# Patient Record
Sex: Male | Born: 1960 | Race: Black or African American | Hispanic: Refuse to answer | Marital: Married | Smoking: Former smoker
Health system: Southern US, Community
[De-identification: ages and names within clinical notes are randomized; demographics above are authoritative.]

## PROBLEM LIST (undated history)

## (undated) DIAGNOSIS — J45909 Unspecified asthma, uncomplicated: Secondary | ICD-10-CM

## (undated) DIAGNOSIS — U071 COVID-19: Secondary | ICD-10-CM

## (undated) DIAGNOSIS — M549 Dorsalgia, unspecified: Secondary | ICD-10-CM

## (undated) DIAGNOSIS — F419 Anxiety disorder, unspecified: Secondary | ICD-10-CM

## (undated) DIAGNOSIS — R1310 Dysphagia, unspecified: Secondary | ICD-10-CM

## (undated) DIAGNOSIS — K219 Gastro-esophageal reflux disease without esophagitis: Secondary | ICD-10-CM

## (undated) DIAGNOSIS — Z8719 Personal history of other diseases of the digestive system: Secondary | ICD-10-CM

## (undated) DIAGNOSIS — J339 Nasal polyp, unspecified: Secondary | ICD-10-CM

## (undated) DIAGNOSIS — T7840XA Allergy, unspecified, initial encounter: Secondary | ICD-10-CM

## (undated) DIAGNOSIS — E119 Type 2 diabetes mellitus without complications: Secondary | ICD-10-CM

## (undated) DIAGNOSIS — R0600 Dyspnea, unspecified: Secondary | ICD-10-CM

## (undated) DIAGNOSIS — R0602 Shortness of breath: Secondary | ICD-10-CM

## (undated) DIAGNOSIS — J189 Pneumonia, unspecified organism: Secondary | ICD-10-CM

## (undated) HISTORY — DX: Type 2 diabetes mellitus without complications: E11.9

## (undated) HISTORY — DX: Dysphagia, unspecified: R13.10

## (undated) HISTORY — DX: Gastro-esophageal reflux disease without esophagitis: K21.9

## (undated) HISTORY — DX: Shortness of breath: R06.02

## (undated) HISTORY — DX: Dorsalgia, unspecified: M54.9

## (undated) HISTORY — PX: FUNCTIONAL ENDOSCOPIC SINUS SURGERY: SUR616

## (undated) HISTORY — PX: DG THUMB RIGHT HAND (ARMC HX): HXRAD1825

## (undated) HISTORY — PX: PARTIAL COLECTOMY: SHX5273

## (undated) HISTORY — DX: Anxiety disorder, unspecified: F41.9

## (undated) HISTORY — PX: DG THUMB LEFT HAND: HXRAD1658

## (undated) HISTORY — DX: Dyspnea, unspecified: R06.00

## (undated) HISTORY — PX: COLON SURGERY: SHX602

## (undated) HISTORY — DX: Allergy, unspecified, initial encounter: T78.40XA

---

## 2001-09-25 ENCOUNTER — Ambulatory Visit (HOSPITAL_COMMUNITY): Admission: RE | Admit: 2001-09-25 | Discharge: 2001-09-25 | Payer: Self-pay | Admitting: *Deleted

## 2002-06-13 ENCOUNTER — Emergency Department (HOSPITAL_COMMUNITY): Admission: EM | Admit: 2002-06-13 | Discharge: 2002-06-14 | Payer: Self-pay | Admitting: Emergency Medicine

## 2002-06-13 ENCOUNTER — Encounter: Payer: Self-pay | Admitting: Emergency Medicine

## 2002-06-14 ENCOUNTER — Encounter: Payer: Self-pay | Admitting: Emergency Medicine

## 2002-12-25 ENCOUNTER — Ambulatory Visit (HOSPITAL_COMMUNITY): Admission: RE | Admit: 2002-12-25 | Discharge: 2002-12-25 | Payer: Self-pay | Admitting: *Deleted

## 2002-12-25 ENCOUNTER — Encounter (INDEPENDENT_AMBULATORY_CARE_PROVIDER_SITE_OTHER): Payer: Self-pay | Admitting: Specialist

## 2005-05-03 ENCOUNTER — Encounter: Admission: RE | Admit: 2005-05-03 | Discharge: 2005-05-03 | Payer: Self-pay | Admitting: *Deleted

## 2006-05-17 ENCOUNTER — Encounter: Admission: RE | Admit: 2006-05-17 | Discharge: 2006-05-17 | Payer: Self-pay | Admitting: Gastroenterology

## 2006-10-10 ENCOUNTER — Ambulatory Visit (HOSPITAL_COMMUNITY): Admission: RE | Admit: 2006-10-10 | Discharge: 2006-10-10 | Payer: Self-pay | Admitting: Gastroenterology

## 2007-01-23 ENCOUNTER — Inpatient Hospital Stay (HOSPITAL_COMMUNITY): Admission: EM | Admit: 2007-01-23 | Discharge: 2007-01-28 | Payer: Self-pay | Admitting: Surgery

## 2007-01-23 ENCOUNTER — Encounter: Admission: RE | Admit: 2007-01-23 | Discharge: 2007-01-23 | Payer: Self-pay | Admitting: Gastroenterology

## 2007-03-08 ENCOUNTER — Inpatient Hospital Stay (HOSPITAL_COMMUNITY): Admission: RE | Admit: 2007-03-08 | Discharge: 2007-03-17 | Payer: Self-pay | Admitting: Surgery

## 2007-03-08 ENCOUNTER — Encounter (INDEPENDENT_AMBULATORY_CARE_PROVIDER_SITE_OTHER): Payer: Self-pay | Admitting: Surgery

## 2007-09-11 ENCOUNTER — Ambulatory Visit (HOSPITAL_BASED_OUTPATIENT_CLINIC_OR_DEPARTMENT_OTHER): Admission: RE | Admit: 2007-09-11 | Discharge: 2007-09-11 | Payer: Self-pay | Admitting: Orthopedic Surgery

## 2009-09-09 ENCOUNTER — Ambulatory Visit: Payer: Self-pay | Admitting: Diagnostic Radiology

## 2009-09-09 ENCOUNTER — Ambulatory Visit (HOSPITAL_BASED_OUTPATIENT_CLINIC_OR_DEPARTMENT_OTHER): Admission: RE | Admit: 2009-09-09 | Discharge: 2009-09-09 | Payer: Self-pay | Admitting: Family Medicine

## 2009-09-09 ENCOUNTER — Ambulatory Visit: Payer: Self-pay | Admitting: Family Medicine

## 2009-09-09 DIAGNOSIS — J159 Unspecified bacterial pneumonia: Secondary | ICD-10-CM | POA: Insufficient documentation

## 2009-09-09 DIAGNOSIS — M79609 Pain in unspecified limb: Secondary | ICD-10-CM | POA: Insufficient documentation

## 2009-09-09 DIAGNOSIS — R109 Unspecified abdominal pain: Secondary | ICD-10-CM | POA: Insufficient documentation

## 2009-09-09 DIAGNOSIS — K219 Gastro-esophageal reflux disease without esophagitis: Secondary | ICD-10-CM | POA: Insufficient documentation

## 2009-09-09 DIAGNOSIS — M542 Cervicalgia: Secondary | ICD-10-CM | POA: Insufficient documentation

## 2009-12-21 ENCOUNTER — Encounter: Payer: Self-pay | Admitting: Family Medicine

## 2009-12-31 ENCOUNTER — Encounter (INDEPENDENT_AMBULATORY_CARE_PROVIDER_SITE_OTHER): Payer: Self-pay | Admitting: *Deleted

## 2010-01-22 ENCOUNTER — Ambulatory Visit: Payer: Self-pay | Admitting: Family Medicine

## 2010-01-22 DIAGNOSIS — K5289 Other specified noninfective gastroenteritis and colitis: Secondary | ICD-10-CM | POA: Insufficient documentation

## 2010-03-02 NOTE — Procedures (Signed)
Summary: [facility]  [facility]   Imported By: Lanelle Bal 01/07/2010 15:07:35  _____________________________________________________________________  External Attachment:    Type:   Image     Comment:   External Document

## 2010-03-02 NOTE — Procedures (Signed)
Summary: [facility]oscopy/[facility]  Colonoscopy/[facility]   Imported By: Lanelle Bal 01/07/2010 15:05:25  _____________________________________________________________________  External Attachment:    Type:   Image     Comment:   External Document

## 2010-03-02 NOTE — Miscellaneous (Signed)
  Clinical Lists Changes  Observations: Added new observation of COLONOSCOPY: Diverticulosis-mild (12/21/2009 11:13)      Preventive Care Screening  Colonoscopy:    Date:  12/21/2009    Results:  Diverticulosis-mild

## 2010-03-02 NOTE — Assessment & Plan Note (Signed)
Summary: new to est /cbs   Vital Signs:  Patient profile:   50 year old male Height:      71 inches Weight:      261 pounds BMI:     36.53 Pulse rate:   94 / minute BP sitting:   140 / 90  (left arm)  Vitals Entered By: Doristine Devoid CMA (September 09, 2009 3:29 PM) CC: NEW EST- check L foot and check lungs    History of Present Illness: 50 yo man here today to establish care.  previous MD- Janey Greaser (retired).  last CPE- Feb for Driving Exam.  1) hx of PNA- dx'd 6/6, '3/4 of a lung full'.  has a 'little nagging cough'.  no fevers since completing abxs.  denies SOB.  2) L foot pain- was playing softball and hit foot on a base.  3 weeks ago.  pain along top of foot on 1st metatarsal.  no swelling or edema.  3) abd pain- will have abd distension and bloating w/ intermittant sharp pains.  taking Prevacid 20mg  daily.  sxs started years ago, prior to colectomy.  sxs typically occur mid week.  improves w/ rest.  will have intestinal spasms.  has GI- Medoff.  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  described as a shooting pain.    Preventive Screening-Counseling & Management  Alcohol-Tobacco     Alcohol drinks/day: 0     Smoking Status: never      Sexual History:  currently monogamous.        Drug Use:  never.    Current Medications (verified): 1)  Prevacid 24hr 15 Mg Cpdr (Lansoprazole) .... Take One Tablet Daily  Allergies (verified): No Known Drug Allergies  Past History:  Past Medical History: GERD  Past Surgical History: Sigmoid Colectomy-Dr. 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 Eyes:  Denies blurring and  double vision. CV:  Denies chest pain or discomfort, difficulty breathing at night, lightheadness, near fainting, palpitations, shortness of breath with exertion, swelling of feet, and swelling of hands. Resp:  Complains of cough; denies pleuritic, sputum productive, and wheezing. GI:  Complains of abdominal pain, gas, and indigestion; denies bloody stools, change in bowel habits, constipation, loss of appetite, nausea, and vomiting. MS:  Denies joint redness, joint swelling, and loss of strength. Neuro:  Denies headaches.  Physical Exam  General:  Well-developed,well-nourished,in no acute distress; alert,appropriate and cooperative throughout examination, overweight. Head:  NCAT Eyes:  PERRL, EOMI Neck:  No deformities, masses. + Trapezius spasm Lungs:  Normal respiratory effort, chest expands symmetrically. Lungs are clear to auscultation, no crackles or wheezes. Heart:  Normal rate and regular rhythm. S1 and S2 normal without gallop, murmur, click, rub or other extra sounds. Abdomen:  soft, NT/ND, +BS Msk:  + TTP over 1st metatarsal on L Pulses:  +2 carotid, radial, DP Extremities:  no C/C/E   Impression & Recommendations:  Problem # 1:  FOOT PAIN, LEFT (ICD-729.5) Assessment New pain over 1st metatarsal.  xray to r/o fx.  start NSAID Orders: T-Foot Left Min 3 Views ([id number])  Problem # 2:  ABDOMINAL PAIN (ICD-789.00) Assessment: New pt has GI specialist.  taking Prevacid.  offered bentyl but  pt prefers discussion w/ GI.  Problem # 3:  BACTERIAL PNEUMONIA (ICD-482.9) Assessment: New hx of recent infxn.  lungs now clear.  reviewed post-infectious cough and that it can persist.  pt otherwise asymptomatic.  Problem # 4:  NECK PAIN (ICD-723.1) Assessment: New most likely related to pt's very tight trapezius spasm.  start NSAID.  reviewed supportive care and red flags that should prompt return.  Pt expresses understanding and is in agreement w/ this plan. His updated medication  list for this problem includes:    Naprosyn 500 Mg Tabs (Naproxen) .Marland Kitchen... 1 two times a day x5-7 days and then as needed for pain.  take w/ food.  Complete Medication List: 1)  Prevacid 24hr 15 Mg Cpdr (Lansoprazole) .... Take one tablet daily 2)  Naprosyn 500 Mg Tabs (Naproxen) .Marland Kitchen.. 1 two times a day x5-7 days and then as needed for pain.  take w/ food.  Patient Instructions: 1)  Follow up in 3-4 weeks for your complete physical.  Do not eat before this appt 2)  Take the Naprosyn as directed for your foot pain 3)  Go to the [facility] for your x[facility]- turn R out of the parking lot, make your 1st R onto [facility].  At the intersection of [place] [address] turn [address].  At first light [address]) turn R and [facility] is on L hand side. 4)  Ice the foot for pain relief 5)  Your neck pain is related to muscle spasm- the anti-inflammatory will help this as well 6)  Call Dr Kinnie Scales to discuss your abdominal pain 7)  Welcome!  We're glad to have you! Prescriptions: NAPROSYN 500 MG TABS (NAPROXEN) 1 two times a day x5-7 days and then as needed for pain.  take w/ food.  #60 x 0   Entered and Authorized by:   Neena Rhymes MD   Signed by:   Neena Rhymes MD on 09/09/2009   Method used:   [facility]        [facility] [address] [phone]* (retail)       [address]       [place], [place]  [address]       Ph: [phone] or [phone]       Fax: [phone]   RxID:   [id number]

## 2010-03-04 NOTE — Assessment & Plan Note (Signed)
Summary: FOR VOMITING//PH   Vital Signs:  Patient profile:   50 year old male Height:      71 inches Weight:      243 pounds O2 Sat:      97 % on Room air Temp:     98.8 degrees F oral Pulse rate:   81 / minute BP sitting:   98 / 62  (left arm)  Vitals Entered By: Jeremy Johann CMA (January 22, 2010 1:00 PM)  O2 Flow:  Room air CC: NVD x2days   History of Present Illness: 50 yo man here today for N/V/D.  started vomiting Wed night.  also having watery diarrhea- reports it is black.  recently had normal colonoscopy.  no longer vomiting.  some intermittant abd pain.  lots of leg and muscle cramps.  eating broth and drinking gatorade- able to hold this down.  + cold sweats.  had burger w/ discolored onion on Wed.  no other sick contacts.  feeling better than previously.  Current Medications (verified): 1)  Prevacid 24hr 15 Mg Cpdr (Lansoprazole) .... Take One Tablet Daily 2)  Naprosyn 500 Mg Tabs (Naproxen) .Marland Kitchen.. 1 Two Times A Day X5-7 Days and Then As Needed For Pain.  Take W/ Food.  Allergies (verified): No Known Drug Allergies  Review of Systems      See HPI  Physical Exam  General:  Well-developed,well-nourished,in no acute distress; alert,appropriate and cooperative throughout examination, overweight. Head:  NCAT Mouth:  MMM Lungs:  Normal respiratory effort, chest expands symmetrically. Lungs are clear to auscultation, no crackles or wheezes. Heart:  Normal rate and regular rhythm. S1 and S2 normal without gallop, murmur, click, rub or other extra sounds. Abdomen:  soft, NT/ND, +BS   Impression & Recommendations:  Problem # 1:  GASTROENTERITIS (ICD-558.9) Assessment New sxs resolving spontaneously.  start phenergan for nausea.  discussed importance of hydration.  reviewed BRAT diet and stepwise advancement to regular diet.  reviewed supportive care and red flags that should prompt return.  Pt expresses understanding and is in agreement w/ this plan.  Complete  Medication List: 1)  Prevacid 24hr 15 Mg Cpdr (Lansoprazole) .... Take one tablet daily 2)  Naprosyn 500 Mg Tabs (Naproxen) .Marland Kitchen.. 1 two times a day x5-7 days and then as needed for pain.  take w/ food. 3)  Promethazine Hcl 25 Mg Tabs (Promethazine hcl) .Marland Kitchen.. 1 tab by mouth q6 as needed for nausea  Patient Instructions: 1)  This appears to be improving on its own 2)  Take the promethazine as needed for nausea 3)  Drink plenty of fluids 4)  The muscle cramps are from the dehydration and low potassium- try and eat some bananas and continue the gatorade 5)  Add bland foods like saltines or other crackers 6)  REST! 7)  Call with any questions or concerns 8)  Hang in there!! 9)  Happy Holidays! Prescriptions: PROMETHAZINE HCL 25 MG  TABS (PROMETHAZINE HCL) 1 tab by mouth Q6 as needed for nausea  #30 x 0   Entered and Authorized by:   Neena Rhymes MD   Signed by:   Neena Rhymes MD on 01/22/2010   Method used:   [facility] to        [facility] [address] [phone]* (retail)       [address]       [place], [place]  [address]       Ph: [phone] or [phone]  Fax: [phone]   RxID:   [id number]    Orders Added: 1)  Est. Patient Level III [30865]

## 2010-03-08 ENCOUNTER — Encounter: Payer: Self-pay | Admitting: Family Medicine

## 2010-03-08 ENCOUNTER — Ambulatory Visit (INDEPENDENT_AMBULATORY_CARE_PROVIDER_SITE_OTHER): Payer: Self-pay | Admitting: Family Medicine

## 2010-03-08 DIAGNOSIS — R42 Dizziness and giddiness: Secondary | ICD-10-CM | POA: Insufficient documentation

## 2010-03-18 NOTE — Assessment & Plan Note (Signed)
Summary: dizzy, lightheaded, ears draining///sph---wants to wait till ...  Nurse Visit   Vital Signs:  Patient profile:   50 year old male Weight:      248 pounds BMI:     34.71 Temp:     98.4 degrees F oral Pulse rate:   80 / minute BP sitting:   110 / 76  (left arm)  Vitals Entered By: Doristine Devoid CMA (March 08, 2010 11:30 AM)  History of Present Illness: 50 yo man here today for dizziness.  most noticeable when holding head to the R or changing positions.  sxs started 1 week ago.  will have dizziness upon turning over in bed first thing in the AM.  no hx of similar.  no CP, SOB, HAs, visual changes.   Patient Instructions: 1)  This is a condition caused benign positional vertigo 2)  It should improve w/ time 3)  Take the Meclizine as needed for dizziness 4)  Do the repositioning exercises 5)  Drink plenty of fluids 6)  If no improvement, please call! 7)  Hang in there!  CC: dizziness when turning head able to move around but last throughout the day but feels lightheaded    Current Medications (verified): 1)  Prevacid 24hr 15 Mg Cpdr (Lansoprazole) .... Take One Tablet Daily 2)  Promethazine Hcl 25 Mg  Tabs (Promethazine Hcl) .Marland Kitchen.. 1 Tab By Mouth Q6 As Needed For Nausea 3)  Meclizine Hcl 25 Mg Tabs (Meclizine Hcl) .Marland Kitchen.. 1 By Mouth Q6 As Needed For Vertigo  Allergies (verified): No Known Drug Allergies  Review of Systems      See HPI   Physical Exam  General:  Well-developed,well-nourished,in no acute distress; alert,appropriate and cooperative throughout examination, overweight. Head:  NCAT, no TTP over sinuses Eyes:  no injxn, inflammation, 2 beats of horizontal nystagmus Ears:  External ear exam shows no significant lesions or deformities.  Otoscopic examination reveals clear canals, tympanic membranes are intact bilaterally without bulging, retraction, inflammation or discharge. Hearing is grossly normal bilaterally. Nose:  External nasal examination shows no  deformity or inflammation. Nasal mucosa are pink and moist without lesions or exudates. Mouth:  Oral mucosa and oropharynx without lesions or exudates.  Teeth in good repair. Lungs:  Normal respiratory effort, chest expands symmetrically. Lungs are clear to auscultation, no crackles or wheezes. Heart:  Normal rate and regular rhythm. S1 and S2 normal without gallop, murmur, click, rub or other extra sounds. Pulses:  +2 carotid, radial, DP Extremities:  no C/C/E Neurologic:  No cranial nerve deficits noted. Station and gait are normal. Plantar reflexes are down-going bilaterally. DTRs are symmetrical throughout. Sensory, motor and coordinative functions appear intact.    Impression & Recommendations:  Problem # 1:  VERTIGO (ICD-780.4) Assessment New pt's sxs consistent w/ BPV.  reviewed dx, printed exercises for pt, meclizine as needed.  reviewed supportive care and red flags that should prompt return.  Pt expresses understanding and is in agreement w/ this plan. His updated medication list for this problem includes:    Promethazine Hcl 25 Mg Tabs (Promethazine hcl) .Marland Kitchen... 1 tab by mouth q6 as needed for nausea    Meclizine Hcl 25 Mg Tabs (Meclizine hcl) .Marland Kitchen... 1 by mouth q6 as needed for vertigo  Complete Medication List: 1)  Prevacid 24hr 15 Mg Cpdr (Lansoprazole) .... Take one tablet daily 2)  Promethazine Hcl 25 Mg Tabs (Promethazine hcl) .Marland Kitchen.. 1 tab by mouth q6 as needed for nausea 3)  Meclizine Hcl 25 Mg  Tabs (Meclizine hcl) .Marland Kitchen.. 1 by mouth q6 as needed for vertigo   Orders Added: 1)  Est. Patient Level III [99213] Prescriptions: MECLIZINE HCL 25 MG TABS (MECLIZINE HCL) 1 by mouth Q6 as needed for vertigo  #45 x 0   Entered and Authorized by:   Neena Rhymes MD   Signed by:   Neena Rhymes MD on 03/08/2010   Method used:   [facility]        [facility] [address] [phone][facility] (retail)       [address]       [place], [place]  [id number]        Ph: [phone] or [phone]       Fax: [phone]   RxID:   [id number]

## 2010-04-27 ENCOUNTER — Encounter: Payer: Self-pay | Admitting: Family Medicine

## 2010-04-28 ENCOUNTER — Ambulatory Visit (INDEPENDENT_AMBULATORY_CARE_PROVIDER_SITE_OTHER): Payer: BC Managed Care – PPO | Admitting: Family Medicine

## 2010-04-28 VITALS — BP 100/70 | Temp 98.2°F | Wt 250.5 lb

## 2010-04-28 DIAGNOSIS — M5430 Sciatica, unspecified side: Secondary | ICD-10-CM | POA: Insufficient documentation

## 2010-04-28 MED ORDER — NAPROXEN 500 MG PO TABS: ORAL_TABLET | ORAL | Status: DC

## 2010-04-28 NOTE — Assessment & Plan Note (Signed)
Pt's pain consistent w/ sciatica.  No red flags on hx or PE.  Start NSAIDs, muscle relaxers at night.  Heat, gentle stretching for pain relief.  Reviewed supportive care and red flags that should prompt return.  Pt expressed understanding and is in agreement w/ plan.

## 2010-04-28 NOTE — Patient Instructions (Signed)
This appears to be a muscle spasm induced sciatica- this should improve w/ time and our treatments plan below Start the Naprosyn 2x/day for 7 days and then as needed for pain- take w/ food Heat for pain relief- heating pad or thermacare patch Do some gentle stretching and some walking to avoid stiffness Use the muscle relaxer as needed at night Call if no improvement after 7- 10 days Hang in there!!

## 2010-04-28 NOTE — Progress Notes (Signed)
  Subjective:    Patient ID: Thomas [name], male    DOB: December 09, 1960, 50 y.o.   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  Intermittant.  Pain will radiate down back of leg and he will some numbness and tingling of calf.  Pain improved w/ rest, muscle relaxer.  Pain worsens w/ increased activity.  Has scaled back on exercising due to pain.  Pain will cause pt to alter walk.  'like a grabbing pain'.  No hx of similar.  No bowel or bladder incontinence.  Drives to [place] for work and sits in same position daily.   Review of Systems For ROS see HPI.    Objective:   Physical Exam  Constitutional: He appears well-developed and well-nourished. No distress.  Musculoskeletal:       No TTP over spine, lumbar paraspinals, or SI joints. (-) SLR bilaterally No pain w/ back extension, 'pulling' pain w/ forward flexion, no pain w/ lateral bending or rotation Strength of LEs 5/5 and symmetric DTRs +2 and symmetric  Neurological: He has normal strength and normal reflexes. No sensory deficit.          Assessment & Plan:

## 2010-06-15 NOTE — Op Note (Signed)
NAMEFRANCISCA, HARBUCK               ACCOUNT NO.:  [id number]   MEDICAL RECORD NO.:  [id number]          PATIENT TYPE:  INP   LOCATION:  1538                         FACILITY:  [facility]   PHYSICIAN:  Excell Seltzer. Annabell Howells, M.D.    DATE OF BIRTH:  02/17/1960   DATE OF PROCEDURE:  03/08/2007  DATE OF DISCHARGE:                               OPERATIVE REPORT   PROCEDURE:  Cystoscopy, right retrograde pyelogram with insertion of  bilateral ureteral catheters.   PREOPERATIVE DIAGNOSIS:  History of diverticulitis with pelvic abscess.   POSTOPERATIVE DIAGNOSIS:  History of diverticulitis with pelvic abscess.   SURGEON:  Dr. Bjorn Pippin.   ANESTHESIA:  General.   COMPLICATIONS:  Contrast extravasation on the right with inability to  place the stent to the kidney.   INDICATIONS:  Keigo is a 50 year old, African American male with a  history of diverticulitis with pelvic abscesses. He is to undergo a  sigmoid resection. Stents were requested for ureteral identification to  be placed prior to the surgery.   FINDINGS AND PROCEDURE:  The patient was taken to the operating room, a  general anesthetic was induced. He was placed in lithotomy position. He  received prophylactic antibiotics per Dr. Ezzard Standing. He was prepped with  Betadine solution and draped in the usual sterile fashion.  Cystoscopy  was performed using the 22-French scope and 12 and 70 degrees lenses.  Examination revealed a normal urethra. The external sphincter was  intact. 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 Examination of the bladder revealed mild trabeculation with  no tumor, stones or inflammation.  Ureteral orifices were unremarkable.   I  attempted to cannulate the right ureteral orifice with a 5-French  open-end catheter but the catheter would not pass more than 2 cm beyond  the orifice. A Glidewire was then passed through the open-end catheter  and was advanced to the lower portion of  the proximal ureter but could  go no further. The ureteral catheter was advanced over the wire, the  wire was removed and contrast was instilled.  This demonstrated passage  of the contrast with what appeared to be a normal ureter up to about the  level of L4 at which point it tapered to close with no contrast  bypassing. The ureter distal to L-4 filled in a normal fashion, but  there was periureteral extravasation. An attempt was made to pass both a  standard guidewire and a Glidewire past the area of apparent obstruction  but this as unsuccessful. Because the purpose of this procedure was  merely to provide for ureteral identification during dissection, I left  a ureteral catheter at the most proximal level I could and did not force  the issue to get it into the kidney.   The retrograde pyelogram demonstrated ureteral tapering with obstruction  at L4. This may be due to a mucosal flap. There was extra ureteral  extravasation.   The preop CT was reviewed. He had no obstruction of the kidney  preoperatively and no lesions to suggest impending obstruction.   Once the right side had  been cannulated, the left ureteral orifice was  cannulated with a 5-French open-end catheter which easily passed to the  kidney under fluoroscopic guidance. At this point, the cystoscope was  removed, the ureteral catheters were left exiting the urethra and an 81-  French catheter was then passed per urethra. The balloon was filled with  10 mL of sterile fluid.  The stents were then secured to the catheter  with a silk tie and all were placed to straight drainage. I spoke with  Dr. Ezzard Standing about the situation with the right ureter and at this point I  believe the most appropriate thing is merely to remove the ureteral  catheter at the end of the case as the problem we encountered was likely  a mucosal flap and not true obstruction and hopefully there will be no  residual issues with a difficult  catheterization, but I did recommend a  renal ultrasound tomorrow unless the patient developed acute symptoms  which required more urgent imaging. I will follow closely. The patient  was then placed in the supine position and Dr. Ezzard Standing performed his  portion of the procedure.      Excell Seltzer. Annabell Howells, M.D.  Electronically Signed     JJW/MEDQ  D:  03/08/2007  T:  03/09/2007  Job:  [id number]   cc:   Sandria Bales. Ezzard Standing, M.D.  [address]  [place]  [place] [address]

## 2010-06-15 NOTE — H&P (Signed)
Thomas [name], Thomas [name]               ACCOUNT NO.:  [id number]   MEDICAL RECORD NO.:  [id number]          PATIENT TYPE:  INP   LOCATION:  1534                         FACILITY:  [facility]   PHYSICIAN:  Sandria Bales. Ezzard Standing, M.D.  DATE OF BIRTH:  May 10, 1960   DATE OF ADMISSION:  01/23/2007  DATE OF DISCHARGE:                              HISTORY & PHYSICAL   HISTORY OF ILLNESS:  The patient is a 50 year old black male, who was  formally a patient of Dr. Bascom [name] until he retired.  He is now seeing  Dr. Kinnie [name] from a GI standpoint, has not really established further  followup from a primary care standpoint.   Thomas [name] has had a history of symptomatic diverticular disease on and  off for some 5 years, thinks he may have had about 4 or 5 diverticular  attacks over those 5 years.  Most recently, he had some trouble in  September 2008, and was managed as an outpatient by Dr. Ritta [name].  He  had a CT scan that showed some diverticulitis but no abscess.  He was  placed on oral antibiotics that seemed to get better. However, last  week, on the December 18th, he again developed lower abdominal pain,  radiating to his genitals and rectum, had some 10+ bowel movements per  day, and was seen by Dr. Kinnie [name], who put him on Cipro and Flagyl for  recurrent diverticulitis.  Because he had no improvement in symptoms  over a few days, today, he was seen by Dr. Kinnie [name], who obtained a CT  scan at [facility].  The CT scan shows 2 pelvic abscesses, one 4.5 x 3.6 cm up  against the adjacent between the sigmoid colon and bladder and the  second one 4.5 x 2.7, immediately posterior to the seminal vesicles.  I  was consulted for further management of this diverticular abscess.   Thomas [name] has had some trouble with reflux disease.  He takes Prevacid.  He has had an upper endoscopy by Dr. Kinnie [name] in November, who saw a small  hiatal hernia.  The same time, he also had a colonoscopy, had 2 benign  colonic polyps removed, but  there has never been any mention of colon  cancer in all these symptoms.   He has had no other abdominal surgery.  Has no history of liver disease,  gallbladder disease, pancreas disease.   PAST MEDICAL HISTORY:  His past medical history, he has no allergies.  Again, his only medication includes the Prevacid and medicine related to  his diverticulitis.   ALLERGIES:  NO ALLERGIES.   REVIEW OF SYSTEMS:  NEUROLOGIC:  No seizure, loss of consciousness.  PULMONARY:  Does not smoke cigarettes.  No history of pneumonia,  tuberculosis, or asthma.  CARDIAC:  No history of  heart disease, chest pain, hypertension. He  never had a cardiac catheterization.  GASTROINTESTINAL:  See history of present illness.  UROLOGIC:  No history of kidney disease or kidney infection.   PERSONAL HISTORY:  He is married. He has adult children.  His wife is  with him in the room.  I talked to and examined him.  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   PHYSICAL EXAMINATION:  VITAL SIGNS:  On physical exam, his temperature  is 97.6, blood pressure 107/75, pulse 72.  HEENT:  Unremarkable.  NECK:  Supple, showed no mass, no thyromegaly.  LUNGS:  Clear to auscultation with symmetric breath sounds.  HEART:  His heart has a regular rate and rhythm.  He is not tachycardic.  He has no murmur.  ABDOMEN:  He has tenderness along his left flank, left lower quadrant  and suprapubic area with some mild guarding.  He has got maybe even some  mild rebound.  He does have active bowel sounds.  I feel no hernia, no  mass.  RECTAL:  I did not do a rectal exam on him.  EXTREMITIES:  Good strength in all 4 extremities.  NEUROLOGICAL:  Grossly intact.   Dr. Kinnie [name] obtained labs today and these were done at [facility].  His  sodium was 135, potassium 4.4, chloride of 97, CO2 of 30. His total  protein was 7.5, albumin 3.9.  His white blood count is 8900, hemoglobin  14, hematocrit 43. Platelet count was 373,000.  C-reactive  protein 128.   I reviewed the CT scan, at least verbally with Dr. Horace [name].   IMPRESSION:  1. Impression is that of pelvic abscess, secondary to diverticular      disease.   PLAN:  IV antibiotics, n.p.o., IV fluids.  We will repeat labs and  follow up.  My hope is this will resolve well enough to get him out of  the hospital on oral antibiotics and plan elective surgery 6-12 weeks  down the road.  If we cannot stabilize his diverticular disease, he may  need more urgent surgery.  He understands that more urgent surgery has a  high risk of leading to a colostomy, where if he can be delayed and  clear this up, there may be less need for a colostomy.   I think he understands about the pros and cons of current medical  management.   1. Gastroesophageal reflux disease, on Prevacid.      Sandria Bales. Ezzard Standing, M.D.  Electronically Signed     DHN/MEDQ  D:  01/23/2007  T:  01/24/2007  Job:  [id number]   cc:   Thomas [name], M.D.  Fax: [phone]   Thomas Decant. Janey Greaser, MD  Fax: [phone]

## 2010-06-15 NOTE — Op Note (Signed)
NAMEELDRED, SOOY               ACCOUNT NO.:  [id number]   MEDICAL RECORD NO.:  [id number]          PATIENT TYPE:  INP   LOCATION:  1538                         FACILITY:  [facility]   PHYSICIAN:  Sandria Bales. Ezzard Standing, M.D.  DATE OF BIRTH:  [birth year]   DATE OF PROCEDURE:  03/08/2007  DATE OF DISCHARGE:                               OPERATIVE REPORT   PREOPERATIVE DIAGNOSIS:  Sigmoid diverticulitis with history of pelvic  abscess.   POSTOPERATIVE DIAGNOSIS:  Sigmoid diverticulitis with abscess  essentially resolved.   PROCEDURE:  Laparoscopic/hand assisted sigmoid colectomy using a 28 EEA  stapler, mobilization of splenic flexure, rigid sigmoidoscopy.  Bilateral ureteral stents by Dr. Ranelle Oyster   SURGEON:  Sandria Bales. Ezzard Standing, M.D.   FIRST ASSISTANT:  Currie Paris, M.D.   ANESTHESIA:  General endotracheal.   BLOOD LOSS:  Minimal.   INDICATIONS FOR PROCEDURE:  Mr. Doss is a 50 year old black male who is  a patient of Dr. Doran Clay prior to his retirement.  He has not yet  identified a primary care physician through [facility] to follow him.  He is also seen by Dr. Ritta Slot.  He has had  repeated bouts of diverticular disease on and off for as long as 5  years.  Most recently he was hospitalized at [facility] from  the 23 to 28 January 2007 with a pelvic abscess thought to be secondary  to his sigmoid colon diverticulitis.  I discussed with him about  proceeding with surgery to resect a segment of sigmoid colon.   I discussed the indications, potential complications.  Potential  complications include but not limited to bleeding, infection, leak from  the bowel, recurrence of diverticulitis.  He has undergone a colonoscopy  recently by Dr. Shela Commons. Kinnie Scales.  I think it is unlikely that any malignancy  is playing a role in this process.   OPERATIVE NOTE:  The patient presented to the operating room and  underwent general endotracheal tube  anesthetic.  He was given a gram of  cefoxitin initiation his procedure.  He had PAS stockings placed, placed  lithotomy position.  His abdomen was prepped.  He had been given  Cefoxitin as a pre-op antibiotic.  He had a foley with bilateral  ureteral stents in place.   Because of the pelvic nature of the abscess and it was unclear what I  would run into, I did as Dr. Bjorn Pippin to place bilateral ureteral  stents.  The left stent placement went okay.  The right stent he got  about up to the iliac vessels and then had some extravasation and left  the stent in that area.   I then held a time-out identifying the patient, procedure and proceeded  with operation.  I accessed the abdominal cavity from right upper  quadrant using a 11-mm Ethicon Optiview.  I then placed six additional 5-  mm trocars.  I placed a 5 right lower quadrant, a 5 mid abdominal and  upper mid abdominal, a 5 mm trocar in the left upper quadrant, a 5 mm  trocar in the left lower quadrant and a 5 mm trocar in the left mid  abdominal trocar and these were all 5 mm trocars.  I spent probably  about an hour mobilizing the splenic flexure.  I tried to go across the  transverse colon to take down the gastrocolic ligament.  I found the  spleen and took down the splenic flexure and then mobilized the left  colon.   I then got down to the pelvis.  The patient had some strandy scarring  and adhesions but these were actually fairly easily broken up. He also  had a fairly thickened peritoneal reflection over the distal colon all  consistent with his recent abscess.  But there was no obvious abscess.  It had responded well to the antibiotics.   I was able to actually break up the adhesions and identified this fairly  well.  I got to the pelvic brim.  I thought I had the sigmoid colon long  enough so then I used the [employer] hand assist port and placed  this through a lower midline incision.   However I did not think I  had it quite the left colon down quite well so  I then used the Gel Port put my hand into the abdomen to help take down  some further adhesions along the left colon.   I now went back to the pelvis, identified an area that I thought was the  area of perforation, actually took my dissection all the way down to the  peritoneal reflection int he pelvis, maybe 1 or 2 cm right above it,  beyond what I thought was the most distal diverticula.  I went up to the  proximal third of the sigmoid colon.  I took out about 12-15 inches of  sigmoid colon.   I divided the proximal sigmoid colon with a 75 GIA stapler.  The distal  colon was divided with the Contour stapler, a blue load.  I was able to  identify the both ureters.   After that I took down the mesentery of the colon using the LigaSure  stapler.  So I now had resected the colon.  It was a fairly low  anastomosis right above the peritoneal flexure I thought best served  with a EEA stapler.   I cut out the staples in the proximal bowel.  I used a sizer that looked  like a 28 EEA was sized the best.  I attached the anvil using a 2-0  prolene pursestring.  Dr. Jamey Ripa broke scrub and went below.  He then  passed the 28 EEA stapler up through the rectal pouch and I then  connected these with a firing.  He then endoscoped the patient, saw no  bleeding, could see the anastomosis, insufflated air while I clamped the  bowel and we submerged this under water.  There was no leak or bubbling.  I did put the stitch on each side of 2-0 silk to help keep any tension  off the colon.   This point I irrigated the abdomen with 2 liters of irrigation.  There  was really no mesentery to close.  The bowel lay flat against the pelvic  brim and I really thought there was no tension on the pelvis.   Also even thought I did mobilize the left colon the omentum, I really  still could not get the omentum to come all the way down to his lower  abdomen.  I then  closed his abdomen with two running #1 PDS sutures.  Also put a couple interrupted PDS sutures to help close the fascia.  I  then irrigated the subcutaneous tissue.  I did infiltrate all the trocar  sites and the wound with a total about 30 mL of 0.25% Marcaine.  I then  irrigated the wound, closed the skin with the skin staples and placed  three Telfa wicks in the wound and stapled all the trocar sites.   The patient tolerated procedure well, was transported to the recovery  room in good condition.  Sponge and needle counts were correct at the  end of this case.  I did remove the ureteral stents at the end of the  case but I left the Foley in place.  He also the NG tube in position and  my other exploration during the case.  The remainder of his bowel  was unremarkable.  He tolerated procedure well, was transported to  recovery room good condition.  Sponge and needle counts were correct end  of the case.   Of note, Dr. Annabell Howells did ask that I get a renal ultrasound tomorrow just  to check his kidneys.      Sandria Bales. Ezzard Standing, M.D.  Electronically Signed     DHN/MEDQ  D:  03/08/2007  T:  03/09/2007  Job:  [id number]   cc:   Al Decant. Janey Greaser, MD  Fax: [phone]   Griffith Citron, M.D.  Fax: [phone]   Excell Seltzer. Annabell Howells, M.D.  Fax: [phone]

## 2010-06-15 NOTE — Op Note (Signed)
NAMELOGYN, KENDRICK               ACCOUNT NO.:  [id number]   MEDICAL RECORD NO.:  [id number]          PATIENT TYPE:  AMB   LOCATION:  DSC                          FACILITY:  [facility]   PHYSICIAN:  Katy Fitch. Sypher, M.D. DATE OF BIRTH:  July 27, 1960   DATE OF PROCEDURE:  09/11/2007  DATE OF DISCHARGE:                               OPERATIVE REPORT   PREOPERATIVE DIAGNOSES:  Unstable, chronically painful, weak left thumb  due to instability of metacarpal phalangeal joint following injury  sustained in 2008 with identification of 65 degrees of hyperextension  instability of metacarpophalangeal joint and chronic laxity of radial  collateral ligament complex.   POSTOPERATIVE DIAGNOSES:  Unstable, chronically painful, weak left thumb  due to instability of metacarpal phalangeal joint following injury  sustained in 2008 with identification of 65 degrees of hyperextension  instability of metacarpophalangeal joint and chronic laxity of radial  collateral ligament complex.  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 Examination of left thumb metacarpophalangeal joint under      anesthesia demonstrating 65 degrees of hyperextension instability      due to a volar plate laxity and greater than 40 degrees of ulnar      deviation instability and palmar subluxation of proximal phalanx      due to instability of dorsal capsule and radial collateral ligament      complex.  2. Arthrodesis of left thumb metacarpophalangeal joint utilizing      autogenous bone graft harvested from the condyles of the metacarpal      and from reamings of the metacarpal followed by Acutrak-2 fixation      utilizing 30-mm and 24-mm Acutrak screw construct.   OPERATING SURGEON:  Katy Fitch. Sypher, MD   ASSISTANT:  Marveen Reeks. Dasnoit, PA-C.   ANESTHESIA:  General  by LMA.   SUPERVISING ANESTHESIOLOGIST:  Zenon Mayo, MD   INDICATIONS:  Kindred Reidinger is a 50 year old right-hand dominant  employee of [employer].   He is status post a significant injury to the left thumb sustained on  the job in October 2008.  He was initially referred for evaluation and  management at [facility].  He was examined by the hand  surgeon and found to have stiffness of the MP and IP joints.  He was  treated with rest, activity modification, splinting, and therapy.  Unfortunately, he was unable to regain normal pinch strength and had a  significant predicament with hyperextension instability of the  metacarpophalangeal joint and a stiff swan-neck posture of the thumb  with a flexion contracture of the interphalangeal joint.   He was seen for a second opinion consultation and his stiff swan-neck  predicament identified.   We had a lengthy informed consent regarding treatment options.  We  advised the possibility of volar plate and radial collateral ligament  reconstruction versus arthrodesis of the thumb.   The worker's compensation insurance carrier required a neutral opinion.  This [place] through vendor.  The initial  opinion was not to approve surgery as the consultant felt that  impairment was not demonstrated.   We once again pointed out to the insurance carrier that Mr. Malia had  marked instability of his MP joint, a stiff swan-neck, and activity of  daily living impairment both at home and at work.   His pinch and grip strength were impaired as documented by objective  measurement.   Ultimately, approval for reconstruction of his thumb was achieved.  He  is now brought to the operating at this time anticipating probable  arthrodesis.   Preoperatively, questions were invited and answered in detail.   PROCEDURE:  Aslan Himes. Jamar is brought to the operating room and placed  in supine  position on the operating table.   Following an anesthesia consult by Dr. Sampson Goon, general anesthesia by  LMA was recommended and accepted by Mr. Borgmeyer.   He is brought to room-6 and placed in supine position on the operating  table and under Dr. Sampson Goon direct supervision, general endotracheal  anesthesia was induced.   Left arm was prepped with Betadine soap solution and sterilely draped.  One gram of Ancef was administered as IV prophylactic antibiotic.   The left arm was exsanguinated with an Esmarch bandage and an arterial  tourniquet proximal on the brachium was inflated to 220 mmHg.   Procedure commenced with examination of the thumb MP joint under  anesthesia.  Mr. Gahm was noted to have hyperextension of his MP joint  of 65 degrees and further flexion of 45 degrees.  He had greater than 40  degrees of ulnar deviation instability at 30 degrees of flexion of the  MP joint evidencing an incompetent radial collateral ligament complex.   He was noted also on resting films to have a palmar subluxation of the  proximal phalanx and a pronated posture due to incompetence of the  dorsal capsule and radial collateral ligament complex.   Given the sum total of predicaments in my judgment, reconstruction of  the volar plate and radial collateral ligament would be unpredictable in  allowing him to return to full duty at [employer] and to recover excellent  pinch and grip strength.  Therefore, we recommended proceeding with an  arthrodesis of the MP joint.   The procedure commenced with a 5-cm curvilinear incision exposing the  extensor mechanism over the metacarpophalangeal joint.   The interval between extensor pollicis brevis and extensor pollicis  longus was sharply incised, and the capsule of the MP joint identified.  This was split in a longitudinal dorsal midline and the MP joint was  opened shotgun style by release of the radial ulnar collateral ligaments  off the metacarpal  head.   The hyaline articular cartilage surfaces of the MP joint were studied.  The central and inferior half of the MP joint had significant  degenerative change of grade II and III chondromalacia.  There were  marginal osteophytes forming at the radial and ulnar condyles of the  metacarpal head.  The central portion of the proximal phalanx had  significant chondromalacia and the dorsal portion of the articular  surface of the proximal phalanx had grade 3 chondromalacia due to the  palmar subluxation of the MP joint causing contact pressure at this  point.   Given these findings, we proceeded directly to a cup-and-cone type  arthrodesis.  Power burs were used to create a proper cup and cone as  well  as hand sculpting with a rongeur on the metacarpal head.  The joint  was set at a position of neutral deviation 15 degrees flexion and slight  pronation.   Two 0.045-inch Kirschner wires were used to pilot the Acutrak screws.  A  pair of Acutrak-2 mini screws were placed one 30 mm in length and one 24  mm in length at different planes to create compression and rotation  control.   A very satisfactory construct was achieved.   Once the MP joint was stabilized, the IP motion was examined.  Mr. Urizar  had extension to neutral and further flexion to 60 degrees.   We should be able to recover a more normal arc of IP motion once the MP  joint is stabilized.   The wound was thoroughly lavage with sterile saline.  Bone graft  harvested from metacarpal head was packed around the arthrodesis site  followed by closure of the periosteum with a series of mattress sutures  of 3-0 Ethibond.  The extensor mechanism was then repaired with figure-  of-eight sutures of 3-0 Ethibond and a running finishing suture.  All  knots were buried deep.  The wound was irrigated followed by repair of  the skin with intradermal 3-0 Prolene and a compressive dressing.  A  thumb spica splint was applied for  protection.   Mr. Eckerman tolerated the surgery and anesthesia well.  He was transferred  to recovery room with stable signs.      Katy Fitch Sypher, M.D.  Electronically Signed     RVS/MEDQ  D:  09/11/2007  T:  09/12/2007  Job:  [id number]

## 2010-06-18 NOTE — Discharge Summary (Signed)
Thomas [name], Thomas [name]               ACCOUNT NO.:  [id number]   MEDICAL RECORD NO.:  [id number]          PATIENT TYPE:  INP   LOCATION:  1509                         FACILITY:  [facility]   PHYSICIAN:  Sandria Bales. Ezzard Standing, M.D.  DATE OF BIRTH:  [birth year]   DATE OF ADMISSION:  01/23/2007  DATE OF DISCHARGE:  01/28/2007                               DISCHARGE SUMMARY   DISCHARGE DIAGNOSES:  1. Pelvic abscess secondary to sigmoid diverticulitis.  2. Gastroesophageal reflux disease.   HISTORY OF PRESENT ILLNESS:  This is a 50 year old black male who was a  patient of Dr. Omer Jack until Dr. Bascom Levels retirement.  He sees  Dr. Ritta Slot from a GI standpoint.   Mr. Vise has had a history of symptomatic diverticular disease on and  off for some five years.  He thinks he may have had 4-5 attacks over  those five years.  His most recent problem prior to this episode was  approximately September 2008 when he was managed as an outpatient by Dr.  Ritta Slot.  A CT scan at that time showed diverticulitis but no  obvious abscess.   He did well to approximately the 18th of December 2008 when again he  developed lower abdominal pain radiating to his genitals and rectum.  He  was having over 10 bowel movements a day.  He was placed on Cipro and  followed by Dr. Kinnie Scales, but without any improvement in his symptoms.  Dr. Kinnie Scales obtained a CT scan at [facility] on the 23rd of December.  A CT scan  showed two pelvic abscesses; a 4.5 x 3.6 cm abscess between the sigmoid  colon in the bladder and a second abscess 4.5 x 2.7 immediately  posterior to the seminal vesicles.  I was consulted for further  management of these diverticular abscesses.   Mr. Kohlbeck has a history of reflux and takes Prevacid.  He had an upper  endoscopy by Dr. Kinnie Scales in November 2008, in which she was found to have  a small hiatal hernia, but, otherwise, unremarkable.  He also had a  colonoscopy in November 2008 in which two benign polyps  were removed,  but there was no other abnormality.   REVIEW OF SYSTEMS:  Otherwise, noncontributory.   ADMISSION PHYSICAL EXAMINATION:  VITAL SIGNS:  Temperature 97.6, blood  pressure 107/75, pulse 72.  ABDOMEN:  Showed tenderness along his left flank and left lower quadrant  and suprapubic area.   LABORATORY DATA:  White blood count was 8900, hemoglobin 14.   He was admitted to the hospital where he was placed on IV antibiotics.  I placed him on Zosyn as antibiotic.  He was kept n.p.o. and observed.   He was in the hospital to the 28th for approximately five days in which  he felt steadily better.  Repeat CT scan showed the fluid collection was  smaller and improved, and he was ready for discharge on the 28th of  December.   DISCHARGE INSTRUCTIONS:  A low residue diet.  He can see me back in 1-2  weeks.  He was given Augmentin to  complete his antibiotic course, and he was  noted to call for any problems or questions.  We talked about letting  this abscess clear over a period of 4-8 weeks and then consider elective  sigmoid colectomy.      Sandria Bales. Ezzard Standing, M.D.  Electronically Signed     DHN/MEDQ  D:  02/19/2007  T:  02/19/2007  Job:  [id number]   cc:   Griffith Citron, M.D.  Fax: [phone]   Al Decant. Janey Greaser, MD  Fax: [phone]

## 2010-06-18 NOTE — Discharge Summary (Signed)
NAMEARMONIE, Thomas [name]               ACCOUNT NO.:  [id number]   MEDICAL RECORD NO.:  [id number]          PATIENT TYPE:  INP   LOCATION:  1538                         FACILITY:  [facility]   PHYSICIAN:  Sandria Bales. Ezzard Standing, M.D.  DATE OF BIRTH:  [birth year]   DATE OF ADMISSION:  03/08/2007  DATE OF DISCHARGE:  03/17/2007                               DISCHARGE SUMMARY   Why can't dates of admission and discharge be here?   DISCHARGE DIAGNOSES:  1. Sigmoid colon diverticulitis with no evidence of malignancy on      pathology.  2. Extravasation contrast from right ureter which resolved.   OPERATIONS PERFORMED:  The patient had a hand-assisted, laparoscopic-  assisted sigmoid colectomy with a  #28 EEA on March 08, 2007.  He had mobilization of splenic flexure.  Bilateral ureteral stents placed by Dr. Bjorn Pippin.   INDICATION FOR PROCEDURE:  Thomas [name] is a 50 year old black male who was  a patient of Dr. Karie Chimera prior to his retirement.  He has yet to  identify a physician at [facility] to follow him.  He is also seen from a GI standpoint by Dr. Ritta Slot.   Thomas [name] has had bouts with diverticulitis on and off for some 5 years.  His most recent bout, however, required hospitalization at [facility] from December 23 to January 28, 2007, for pelvic abscess which  was felt to be secondary to the diverticulitis.   After this bout, I discussed with him about proceeding with elective  resection of the sigmoid colon.   He has done well since his last hospitalization and now has no abdominal  pain.  Because of the abscess being down in the pelvis, I requested Dr.  Bjorn Pippin to place bilateral ureteral stents at the initiation the  operation.   PAST MEDICAL HISTORY:  Otherwise fairly noncontributory.   He completed a mechanical and antibiotic bowel prep at home and  presented at [facility] on March 08, 2007.   He underwent a hand and  laparotomy-assisted sigmoid colectomy using a  #28 EEA stapler, mobilization of splenic flexure, and bilateral ureteral  stent placement by Dr. Bjorn Pippin at the time of surgery.   It was noted in the stent placement that he did have extravasation in  his right ureter, but Dr. Annabell Howells thought that just removing the stent and  keeping an eye on it that this would probably resolve spontaneously.   On the first postoperative day, his hemoglobin was 12, white blood count  9700. Creatinine 1.1.  He did have some hematuria and some very mild  right flank pain.  I left an NG tube in him for 2 days.  By the fourth  postoperative day, he was still complaining of right flank discomfort.  He had active bowel sounds. His wound looked good. By the fifth  postoperative day, he had a small bowel movement.  I started him on  clear liquids.  He continued to have some moderate hematuria and some  right flank pain, still had sort of wine-colored urine.  By the sixth  postoperative day, his urine was clearing. He was feeling better,  tolerating his diet, and he was advanced.   By the ninth day, he was afebrile.  Incision looked good, and he was  ready for discharge.   His final pathology showed diverticulosis with no evidence of malignancy  of the sigmoid colon.   His discharge instructions included a regular diet as tolerated,  encouraged to take liquids.  He is asked not to drive for about 4-5 days.  He could shower on discharge.  He was to see me back in 2 weeks for followup and call for any interval  problems.  He was given Vicodin for pain.      Sandria Bales. Ezzard Standing, M.D.  Electronically Signed     DHN/MEDQ  D:  04/02/2007  T:  04/02/2007  Job:  [id number]   cc:   Griffith Citron, M.D.  Fax: [phone]   Al Decant. Janey Greaser, MD  Fax: [phone]

## 2010-10-21 LAB — CBC
Hemoglobin: 14.4
MCV: 88.6

## 2010-10-21 LAB — COMPREHENSIVE METABOLIC PANEL
ALT: 49
Albumin: 3.7
Alkaline Phosphatase: 79
CO2: 29
Calcium: 9.2
Creatinine, Ser: 1.11
GFR calc Af Amer: >60
GFR calc non Af Amer: >60
Potassium: 4.4
Sodium: 141
Total Bilirubin: 0.5

## 2010-10-21 LAB — DIFFERENTIAL
Basophils Absolute: 0
Eosinophils Absolute: 0.3
Eosinophils Relative: 5
Lymphs Abs: 2.8
Neutrophils Relative %: 41 — ABNORMAL LOW

## 2010-10-22 LAB — CBC
HCT: 34.9 — ABNORMAL LOW
HCT: 36.5 — ABNORMAL LOW
MCHC: 35.3
MCHC: 35.5
MCV: 87.8
MCV: 88.8
Platelets: 314
RBC: 4.16 — ABNORMAL LOW
RDW: 12.6
WBC: 10.4
WBC: 9.7

## 2010-10-22 LAB — BASIC METABOLIC PANEL
CO2: 28
Calcium: 8.5
Chloride: 103
Creatinine, Ser: 1.19
Creatinine, Ser: 1.52 — ABNORMAL HIGH
GFR calc non Af Amer: >60

## 2010-10-22 LAB — DIFFERENTIAL
Basophils Absolute: 0
Basophils Relative: 0
Basophils Relative: 0
Eosinophils Absolute: 0.3
Eosinophils Relative: 1
Eosinophils Relative: 3
Lymphocytes Relative: 13
Lymphocytes Relative: 14
Lymphs Abs: 1.4
Monocytes Relative: 9
Monocytes Relative: 9

## 2010-10-29 LAB — POCT HEMOGLOBIN-HEMACUE: Hemoglobin: 14.8

## 2010-11-05 LAB — CBC
HCT: 35.9 — ABNORMAL LOW
HCT: 36.5 — ABNORMAL LOW
MCHC: 34.9
MCV: 89.4
Platelets: 332
Platelets: 350
Platelets: 376
RDW: 12.3
RDW: 12.6
WBC: 6.1
WBC: 7.4

## 2010-11-05 LAB — DIFFERENTIAL
Basophils Absolute: 0.1
Basophils Absolute: 0.1
Basophils Relative: 1
Eosinophils Absolute: 0.2
Eosinophils Absolute: 0.3
Eosinophils Relative: 4
Lymphocytes Relative: 32
Monocytes Relative: 9

## 2011-03-15 ENCOUNTER — Encounter: Payer: Self-pay | Admitting: Family Medicine

## 2011-03-15 ENCOUNTER — Ambulatory Visit (INDEPENDENT_AMBULATORY_CARE_PROVIDER_SITE_OTHER): Payer: BC Managed Care – PPO | Admitting: Family Medicine

## 2011-03-15 DIAGNOSIS — R252 Cramp and spasm: Secondary | ICD-10-CM | POA: Insufficient documentation

## 2011-03-15 DIAGNOSIS — M25529 Pain in unspecified elbow: Secondary | ICD-10-CM

## 2011-03-15 DIAGNOSIS — R51 Headache: Secondary | ICD-10-CM

## 2011-03-15 DIAGNOSIS — R519 Headache, unspecified: Secondary | ICD-10-CM | POA: Insufficient documentation

## 2011-03-15 DIAGNOSIS — R0789 Other chest pain: Secondary | ICD-10-CM

## 2011-03-15 MED ORDER — ESOMEPRAZOLE MAGNESIUM 40 MG PO CPDR: 40.0000 mg | DELAYED_RELEASE_CAPSULE | Freq: Every day | ORAL | Status: DC

## 2011-03-15 MED ORDER — LANSOPRAZOLE 30 MG PO CPDR: 30.0000 mg | DELAYED_RELEASE_CAPSULE | Freq: Every day | ORAL | Status: DC

## 2011-03-15 NOTE — Progress Notes (Signed)
  Subjective:    Patient ID: Thomas [name], male    DOB: Dec 21, 1960, 51 y.o.   MRN: [id number]  HPI Chest 'pull'- 'very comfortable'.  Occurs when bending forward.  Was dx'd w/ hiatal hernia.  Denies sour brash, heart burn- 'i'm on prevacid for that'.  sxs started 3 months ago.  Will also occur when sitting upright.  Not associated w/ exercise or exertion.  Denies SOB, diaphoresis.  'stress can trigger it'.  No N/V.  HA- dx'd w/ sinus infxn in Dec.  tx'd w/ abx.  HAs improved.  Reports he will still have infrequent mild HAs, 'not like a migraine or anything'.  No nausea or visual changes.  HAs are L temporal when they occur.  Has not had one in 'several weeks'.  Leg cramps- occuring in R calf, will wake him up.  Reports good water intake but when sxs occurred on Friday admits to dehydration.  Hx of colectomy.    L elbow pain- told pt to schedule f/u appt for this b/c there is not time.  Pt refused.  Review of Systems For ROS see HPI     Objective:   Physical Exam  Vitals reviewed. Constitutional: He is oriented to person, place, and time. He appears well-developed and well-nourished. No distress.  HENT:  Head: Normocephalic and atraumatic.       TMs normal bilaterally No TTP over sinuses Nasal turbinate edema + PND  Eyes: Conjunctivae and EOM are normal. Pupils are equal, round, and reactive to light.  Neck: Normal range of motion. Neck supple. No thyromegaly present.  Cardiovascular: Normal rate, regular rhythm, normal heart sounds and intact distal pulses.   No murmur heard. Pulmonary/Chest: Effort normal and breath sounds normal. No respiratory distress.  Abdominal: Soft. Bowel sounds are normal. He exhibits no distension.  Musculoskeletal: He exhibits no edema.  Lymphadenopathy:    He has no cervical adenopathy.  Neurological: He is alert and oriented to person, place, and time. No cranial nerve deficit. Coordination normal.  Skin: Skin is warm and dry.  Psychiatric: He has a  normal mood and affect. His behavior is normal.          Assessment & Plan:

## 2011-03-15 NOTE — Patient Instructions (Addendum)
Schedule your complete physical at your convenience- do not eat before this appt We'll call you with your ortho appt for your elbow Continue Ibuprofen for elbow pain- ICE! We'll notify you of your lab results Increase your Prevacid back to recommended dose- your reflux is not well controlled Drink plenty of fluids Increase your potassium intake in the form of bananas, citrus fruits, leafy green veggies- this will help w/ cramping Your headaches are likely untreated nasal allergies- start Claritin or Zyrtec daily Call with any questions or concerns Hang in there!

## 2011-03-22 ENCOUNTER — Telehealth: Payer: Self-pay | Admitting: *Deleted

## 2011-03-22 NOTE — Telephone Encounter (Signed)
letter mailed to patients home address with results.  

## 2011-03-22 NOTE — Telephone Encounter (Signed)
Left pt lab results on cell number per pt request, advised for pt to call office with any questions or concerns per lab results normal

## 2011-03-29 NOTE — Assessment & Plan Note (Signed)
New.  Most likely combo of dehydration and low K+.  Increase fluid intake and reviewed dietary options high in K+.  Will follow.

## 2011-03-29 NOTE — Assessment & Plan Note (Signed)
New.  Most likely untreated nasal allergies.  Start OTC antihistamine as sxs consistent w/ sinus HA.

## 2011-03-29 NOTE — Assessment & Plan Note (Signed)
New.  EKG done- see document for interpretation.  sxs and EKG not consistent w/ cardiac pain.  Most likely due to undertreated GERD.  Pt to increase dose back to recommended dose to improve GERD control.  Reviewed supportive care and red flags that should prompt return.  Pt expressed understanding and is in agreement w/ plan.

## 2011-03-29 NOTE — Assessment & Plan Note (Signed)
New.  Pt refused to schedule appt and there was not enough time to address concern.  NSAIDs, ice and ortho referral.  Pt expressed understanding and is in agreement w/ plan.

## 2011-04-18 ENCOUNTER — Other Ambulatory Visit: Payer: Self-pay

## 2011-04-18 MED ORDER — LANSOPRAZOLE 30 MG PO CPDR: 30.0000 mg | DELAYED_RELEASE_CAPSULE | Freq: Every day | ORAL | Status: DC

## 2011-05-10 ENCOUNTER — Other Ambulatory Visit: Payer: Self-pay | Admitting: *Deleted

## 2011-05-10 MED ORDER — LANSOPRAZOLE 30 MG PO CPDR: 30.0000 mg | DELAYED_RELEASE_CAPSULE | Freq: Every day | ORAL | Status: DC

## 2011-05-10 NOTE — Telephone Encounter (Signed)
Refill request noted for lansoprazole DR 30mg , rx sent to pharmacy by e-script

## 2011-07-01 ENCOUNTER — Other Ambulatory Visit: Payer: Self-pay | Admitting: Gastroenterology

## 2011-07-01 DIAGNOSIS — R109 Unspecified abdominal pain: Secondary | ICD-10-CM

## 2011-07-04 ENCOUNTER — Telehealth: Payer: Self-pay | Admitting: *Deleted

## 2011-07-04 ENCOUNTER — Ambulatory Visit
Admission: RE | Admit: 2011-07-04 | Discharge: 2011-07-04 | Disposition: A | Discharge status: Home / Self Care | Payer: BC Managed Care – PPO | Source: Ambulatory Visit | Attending: Gastroenterology | Admitting: Gastroenterology

## 2011-07-04 DIAGNOSIS — R109 Unspecified abdominal pain: Secondary | ICD-10-CM

## 2011-07-04 MED ORDER — IOHEXOL 300 MG/ML  SOLN
125.0000 mL | Freq: Once | INTRAMUSCULAR | Status: AC | PRN
  Administered 2011-07-04: 125 mL via INTRAVENOUS

## 2011-07-04 NOTE — Telephone Encounter (Signed)
Request for pt lab visits/office notes and any radiology noted per upcoming visit, noted pt has signed a release request however did not check the appropriate box _I do give permission or _ I do not give permission to release information, was advised per MD Tabori verbally to send requested paperwork, all faxed attention Bjorn Loser [phone],

## 2012-01-15 ENCOUNTER — Ambulatory Visit (INDEPENDENT_AMBULATORY_CARE_PROVIDER_SITE_OTHER): Payer: BC Managed Care – PPO | Admitting: Family Medicine

## 2012-01-15 VITALS — BP 136/90 | HR 77 | Temp 98.1°F | Resp 16 | Ht 71.5 in | Wt 255.0 lb

## 2012-01-15 DIAGNOSIS — M545 Low back pain, unspecified: Secondary | ICD-10-CM

## 2012-01-15 DIAGNOSIS — S39012A Strain of muscle, fascia and tendon of lower back, initial encounter: Secondary | ICD-10-CM

## 2012-01-15 DIAGNOSIS — S335XXA Sprain of ligaments of lumbar spine, initial encounter: Secondary | ICD-10-CM

## 2012-01-15 MED ORDER — CYCLOBENZAPRINE HCL 5 MG PO TABS: ORAL_TABLET | ORAL | Status: DC

## 2012-01-15 MED ORDER — TRAMADOL HCL 50 MG PO TABS
50.0000 mg | ORAL_TABLET | Freq: Four times a day (QID) | ORAL | Status: DC | PRN
Start: 2012-01-15 — End: 2012-09-18

## 2012-01-15 MED ORDER — MELOXICAM 7.5 MG PO TABS: 7.5000 mg | ORAL_TABLET | Freq: Every day | ORAL | Status: DC

## 2012-01-15 NOTE — Progress Notes (Signed)
  Subjective:    Patient ID: Thomas [name], male    DOB: [birth year], 51 y.o.   MRN: [id number]  HPI Thomas [name] is a 51 y.o. male Reaching down to shower door 5 days ago - pulled lower back.  Trouble bending afterward.  Sore to move.  No bowel or bladder incontinence, no saddle anesthesia, no lower extremity weakness.  Pain in center of lower back.  No radiation to legs - not like prior sciatica.  Tx: advil or alleve throughout the day. (not on naprosyn anymore).  Takes prevacid for acid reflux, no PUD.   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   No hx seizure d/o. Review of Systems  Musculoskeletal: Positive for back pain and arthralgias.  Skin: Negative for rash.  Neurological: Negative for weakness.       Objective:   Physical Exam  Vitals reviewed. Constitutional: He appears well-developed and well-nourished.  HENT:  Head: Normocephalic and atraumatic.  Neck: Normal range of motion.  Pulmonary/Chest: Effort normal.  Abdominal: Soft. There is no tenderness.  Musculoskeletal: He exhibits tenderness.       Lumbar back: He exhibits decreased range of motion, tenderness and spasm. He exhibits no bony tenderness.       Back:  Neurological: He is alert. He has normal strength. No sensory deficit.  Reflex Scores:      Patellar reflexes are 2+ on the right side and 2+ on the left side.      Achilles reflexes are 2+ on the right side and 2+ on the left side.      Able to heel and toe walk without difficulty.  Skin: Skin is warm and dry.  Psychiatric: He has a normal mood and affect. His behavior is normal.      Assessment & Plan:  Thomas [name] is a 51 y.o. male 1. Lumbar strain  meloxicam (MOBIC) 7.5 MG tablet, cyclobenzaprine (FLEXERIL) 5 MG tablet, traMADol (ULTRAM) 50 MG tablet  2. Low back pain  meloxicam (MOBIC) 7.5 MG tablet, cyclobenzaprine (FLEXERIL) 5 MG tablet, traMADol (ULTRAM) 50 MG tablet  likely paraspinal strain - low impact, and reassurring exam, except decreased  ROM, likely spasm.  Discussed xray or trial of tx for 4-5 days.  wil try tx as above, then rtc if not improving in few days. rtc precautions given. Back care manual given.  Patient Instructions  You likely have a low back strain - you can try heat or ice and other recommendations in the book.  meloxicam in the morning - 1 to 2 each day as needed,  Flexeril or ultram (pain medicine) as needed, but both can cause sedation - do not drive while taking these and be careful combining them.  Return to the clinic or go to the nearest emergency room if any of your symptoms worsen or new symptoms occur, or if not improving next 4-5 days, as we may need to do an xray at that time.

## 2012-01-15 NOTE — Patient Instructions (Signed)
You likely have a low back strain - you can try heat or ice and other recommendations in the book.  meloxicam in the morning - 1 to 2 each day as needed,  Flexeril or ultram (pain medicine) as needed, but both can cause sedation - do not drive while taking these and be careful combining them.  Return to the clinic or go to the nearest emergency room if any of your symptoms worsen or new symptoms occur, or if not improving next 4-5 days, as we may need to do an xray at that time.

## 2012-03-07 ENCOUNTER — Ambulatory Visit (INDEPENDENT_AMBULATORY_CARE_PROVIDER_SITE_OTHER): Payer: BC Managed Care – PPO | Admitting: Family Medicine

## 2012-03-07 ENCOUNTER — Encounter: Payer: Self-pay | Admitting: Family Medicine

## 2012-03-07 VITALS — BP 142/82 | HR 86 | Temp 98.3°F | Ht 71.75 in | Wt 260.0 lb

## 2012-03-07 DIAGNOSIS — S39012A Strain of muscle, fascia and tendon of lower back, initial encounter: Secondary | ICD-10-CM

## 2012-03-07 DIAGNOSIS — M545 Low back pain, unspecified: Secondary | ICD-10-CM

## 2012-03-07 DIAGNOSIS — M792 Neuralgia and neuritis, unspecified: Secondary | ICD-10-CM

## 2012-03-07 DIAGNOSIS — S335XXA Sprain of ligaments of lumbar spine, initial encounter: Secondary | ICD-10-CM

## 2012-03-07 DIAGNOSIS — IMO0002 Reserved for concepts with insufficient information to code with codable children: Secondary | ICD-10-CM

## 2012-03-07 MED ORDER — MELOXICAM 7.5 MG PO TABS: 7.5000 mg | ORAL_TABLET | Freq: Every day | ORAL | Status: DC

## 2012-03-07 NOTE — Progress Notes (Signed)
  Subjective:    Patient ID: Thomas [name], male    DOB: [birth year], 52 y.o.   MRN: [id number]  HPI Arm/shoulder- pain radiating down R arm w/ numbness in hands and fingers.  sxs are intermittent.  No neck pain.  sxs started 2 months ago but has progressively worsened.  No relief w/ mobic and naproxen.  sxs will improve w/ holding arm over head.  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  No similar sxs on L.  Pain will travel up into armpit, back into shoulder blade.   Review of Systems For ROS see HPI     Objective:   Physical Exam  Vitals reviewed. Constitutional: He appears well-developed and well-nourished. No distress.  Musculoskeletal:       Right shoulder: He exhibits normal range of motion, no tenderness, no bony tenderness, no swelling, no effusion, no crepitus, no pain, no spasm, normal pulse and normal strength.       Arms: Mild discomfort w/ internal rotation w/out limited range of motion          Assessment & Plan:

## 2012-03-07 NOTE — Patient Instructions (Addendum)
Take the mobic or the Aleve but not both Use a heating pad for shoulder pain We'll call you with your ortho appt Call with any questions or concerns Hang in there!

## 2012-03-08 ENCOUNTER — Telehealth: Payer: Self-pay | Admitting: Family Medicine

## 2012-03-08 NOTE — Telephone Encounter (Signed)
Patient states he thinks he needs a neuro or ortho referral (whichever you recommend). He states he has this week and Monday off work and would like to have an appointment at that time.

## 2012-03-11 NOTE — Assessment & Plan Note (Signed)
New.  No decreased strength or ROM but due to radicular sxs will refer to ortho.  Continue NSAID- cautioned on use of 2 NSAIDs and encouraged pt to pick one.  Reviewed supportive care and red flags that should prompt return.  Pt expressed understanding and is in agreement w/ plan.

## 2012-07-13 ENCOUNTER — Other Ambulatory Visit: Payer: Self-pay | Admitting: Family Medicine

## 2012-09-18 ENCOUNTER — Ambulatory Visit (INDEPENDENT_AMBULATORY_CARE_PROVIDER_SITE_OTHER): Payer: BC Managed Care – PPO | Admitting: Family Medicine

## 2012-09-18 ENCOUNTER — Telehealth: Payer: Self-pay | Admitting: Family Medicine

## 2012-09-18 ENCOUNTER — Encounter: Payer: Self-pay | Admitting: Family Medicine

## 2012-09-18 VITALS — BP 140/90 | HR 88 | Temp 98.8°F | Wt 267.0 lb

## 2012-09-18 DIAGNOSIS — M545 Low back pain: Secondary | ICD-10-CM

## 2012-09-18 DIAGNOSIS — IMO0002 Reserved for concepts with insufficient information to code with codable children: Secondary | ICD-10-CM

## 2012-09-18 DIAGNOSIS — L089 Local infection of the skin and subcutaneous tissue, unspecified: Secondary | ICD-10-CM

## 2012-09-18 DIAGNOSIS — R609 Edema, unspecified: Secondary | ICD-10-CM

## 2012-09-18 DIAGNOSIS — H538 Other visual disturbances: Secondary | ICD-10-CM

## 2012-09-18 DIAGNOSIS — J329 Chronic sinusitis, unspecified: Secondary | ICD-10-CM

## 2012-09-18 DIAGNOSIS — Z5189 Encounter for other specified aftercare: Secondary | ICD-10-CM

## 2012-09-18 DIAGNOSIS — S39012D Strain of muscle, fascia and tendon of lower back, subsequent encounter: Secondary | ICD-10-CM

## 2012-09-18 DIAGNOSIS — R079 Chest pain, unspecified: Secondary | ICD-10-CM

## 2012-09-18 DIAGNOSIS — R42 Dizziness and giddiness: Secondary | ICD-10-CM

## 2012-09-18 MED ORDER — MELOXICAM 7.5 MG PO TABS: 7.5000 mg | ORAL_TABLET | Freq: Every day | ORAL | Status: DC

## 2012-09-18 MED ORDER — HYDROCHLOROTHIAZIDE 25 MG PO TABS: 25.0000 mg | ORAL_TABLET | Freq: Every day | ORAL | Status: DC

## 2012-09-18 MED ORDER — AMOXICILLIN-POT CLAVULANATE 875-125 MG PO TABS: 1.0000 | ORAL_TABLET | Freq: Two times a day (BID) | ORAL | Status: DC

## 2012-09-18 MED ORDER — BECLOMETHASONE DIPROPIONATE 80 MCG/ACT NA AERS: 2.0000 | INHALATION_SPRAY | Freq: Every day | NASAL | Status: DC

## 2012-09-18 NOTE — Telephone Encounter (Signed)
Patient Information:  Caller Name: Ray[place]  Phone: [phone]  Patient: Thomas [name], Thomas [name]  Gender: Male  DOB: 04/15/1960  Age: 52 Years  PCP: Sheliah Hatch.  Office Follow Up:  Does the office need to follow up with this patient?: Yes  Instructions For The Office: Please call to advise if MD approves office visit at 1530 vs UC now.  RN Note:  Woke during night sweating; suspected had fever during the night. Eye pain is moderate. Noted mild blurred vision in both eyes. Uncertain if sinus pain or congestion present.  May go to [facility] now.  Prefers to keep office appointment.  Disposition requires MD approval to keep appointment at 1530 scheduled by office staff since office is open.  Please call back ASAP.  Symptoms  Reason For Call & Symptoms: Lightheaded, dizzy and pain behind both eyes  Reviewed Health History In EMR: Yes  Reviewed Medications In EMR: Yes  Reviewed Allergies In EMR: Yes  Reviewed Surgeries / Procedures: No  Date of Onset of Symptoms: 09/16/2012  Treatments Tried: Claritin  Treatments Tried Worked: No  Guideline(s) Used:  Eye Pain  Disposition Per Guideline:   Go to ED Now (or to Office with PCP Approval)  Reason For Disposition Reached:   Blurred vision and new or worsening  Advice Given:  N/A  RN Overrode Recommendation:  Patient Already Has Appt, Document Patient  Please call with MD approval to keep appointment at 1530 vs go to UC now

## 2012-09-18 NOTE — Progress Notes (Signed)
  Subjective:     Thomas [name] is a 52 y.o. male who presents for evaluation of sinus pain. Symptoms include: congestion, facial pain, headaches, nasal congestion and sinus pressure. Onset of symptoms was 2 days ago. Symptoms have been gradually worsening since that time. Past history is significant for no history of pneumonia or bronchitis. Patient is a non-smoker. Pt had an episode of chest pain with. coughing and pt is worried about his heart.  Has had no chest pain since. He has a lot of sinus pressure and dizziness as well. Pt was also bit but insect (? Spider) 3rd toe R foot.  It is itchy , and hot to touch.      The following portions of the patient's history were reviewed and updated as appropriate: allergies, current medications, past family history, past medical history, past social history, past surgical history and problem list.  Review of Systems Pertinent items are noted in HPI.   Objective:    BP 140/90  Pulse 88  Temp(Src) 98.8 F (37.1 C) (Oral)  Wt 267 lb (121.11 kg)  BMI 36.48 kg/m2  SpO2 97% General appearance: alert, cooperative, appears stated age and no distress Ears: + fluid b/l Nose: green discharge, moderate congestion, turbinates red, swollen, sinus tenderness bilateral Throat: lips, mucosa, and tongue normal; teeth and gums normal Neck: mild anterior cervical adenopathy, supple, symmetrical, trachea midline and thyroid not enlarged, symmetric, no tenderness/mass/nodules Lungs: clear to auscultation bilaterally Extremities: extremities normal, atraumatic, no cyanosis or edema    Assessment:    Acute bacterial sinusitis.    Plan:    Nasal steroids per medication orders. Antihistamines per medication orders. Augmentin per medication orders.

## 2012-09-18 NOTE — Patient Instructions (Addendum)

## 2012-09-18 NOTE — Telephone Encounter (Signed)
Only pt can determine if he needs to be seen immediately.  If pain is so severe that he can't wait, would recommend going immediately to UC.  If blurred vision, needs to have someone drive him.  Unfortunately I don't have enough information to make a recommendation.

## 2012-09-18 NOTE — Telephone Encounter (Signed)
Pt advised he has symptoms X7 days. Pt states that he would rather wait to see our providers. Advised that if symptoms worsen that he should seek treatment at UC to be evaluated earlier. Patient expressed an understanding.

## 2012-09-19 DIAGNOSIS — E669 Obesity, unspecified: Secondary | ICD-10-CM | POA: Insufficient documentation

## 2012-09-19 DIAGNOSIS — Z6835 Body mass index (BMI) 35.0-35.9, adult: Secondary | ICD-10-CM | POA: Insufficient documentation

## 2012-09-19 DIAGNOSIS — IMO0002 Reserved for concepts with insufficient information to code with codable children: Secondary | ICD-10-CM | POA: Insufficient documentation

## 2012-09-19 DIAGNOSIS — Z6834 Body mass index (BMI) 34.0-34.9, adult: Secondary | ICD-10-CM | POA: Insufficient documentation

## 2012-09-19 NOTE — Progress Notes (Signed)
  Subjective:    Patient ID: Thomas [name], male    DOB: Feb 06, 1960, 52 y.o.   MRN: [id number]  HPI    Review of Systems     Objective:   Physical Exam   R foot 3re toe--- + bite mark with surrounding errythema, + warm to touch      Assessment & Plan:

## 2012-09-19 NOTE — Assessment & Plan Note (Signed)
Pt put on abx for sinusitis Rto prn

## 2013-01-09 ENCOUNTER — Other Ambulatory Visit: Payer: Self-pay | Admitting: Family Medicine

## 2013-01-10 NOTE — Telephone Encounter (Signed)
Rx sent to the pharmacy by e-script.//AB/CMA 

## 2013-04-23 ENCOUNTER — Ambulatory Visit (INDEPENDENT_AMBULATORY_CARE_PROVIDER_SITE_OTHER): Payer: BC Managed Care – PPO | Admitting: Internal Medicine

## 2013-04-23 VITALS — BP 124/82 | HR 86 | Temp 98.1°F | Resp 18 | Ht 71.5 in | Wt 264.0 lb

## 2013-04-23 DIAGNOSIS — J019 Acute sinusitis, unspecified: Secondary | ICD-10-CM

## 2013-04-23 DIAGNOSIS — R059 Cough, unspecified: Secondary | ICD-10-CM

## 2013-04-23 DIAGNOSIS — R05 Cough: Secondary | ICD-10-CM

## 2013-04-23 MED ORDER — HYDROCOD POLST-CHLORPHEN POLST 10-8 MG/5ML PO LQCR: 5.0000 mL | Freq: Two times a day (BID) | ORAL | Status: DC | PRN

## 2013-04-23 MED ORDER — AZITHROMYCIN 500 MG PO TABS: 500.0000 mg | ORAL_TABLET | Freq: Every day | ORAL | Status: DC

## 2013-04-23 NOTE — Progress Notes (Signed)
   Subjective:    Patient ID: Thomas [name], male    DOB: 02-08-1960, 53 y.o.   MRN: [id number]  HPI    Review of Systems     Objective:   Physical Exam        Assessment & Plan:

## 2013-04-23 NOTE — Patient Instructions (Signed)

## 2013-04-23 NOTE — Progress Notes (Signed)
   Subjective:    Patient ID: Thomas [name], male    DOB: 1960/03/01, 53 y.o.   MRN: [id number]  HPI Thomas [name] presents today with sinus pressure. Thomas [name] has congestion and fever. Thomas [name] states his symptoms appeared four days ago. His congestion appeared first. Thomas [name] has night sweats. Thomas [name] denies nausea, vomiting, diarrhea, or chills. Makael has taken mucinex cold and sinus which hasn't helped much. No one else in the home is sick. Thomas [name] has a mild cough. Thomas [name] is blowing out yellow mucous from his nose. Thomas [name] also complains of a sinus headache.    Review of Systems     Objective:   Physical Exam  Constitutional: Thomas [name] is oriented to person, place, and time. Thomas [name] appears well-developed and well-nourished.  HENT:  Head: Normocephalic.  Right Ear: External ear normal.  Left Ear: External ear normal.  Nose: Mucosal edema, rhinorrhea and sinus tenderness present. Epistaxis is observed. Right sinus exhibits maxillary sinus tenderness. Right sinus exhibits no frontal sinus tenderness. Left sinus exhibits maxillary sinus tenderness. Left sinus exhibits no frontal sinus tenderness.  Mouth/Throat: Oropharynx is clear and moist.  Eyes: EOM are normal.  Cardiovascular: Normal rate.   Pulmonary/Chest: Effort normal and breath sounds normal. Thomas [name] has no wheezes. Thomas [name] exhibits no tenderness.  Neurological: Thomas [name] is alert and oriented to person, place, and time. Thomas [name] exhibits normal muscle tone. Coordination normal.  Psychiatric: Thomas [name] has a normal mood and affect.          Assessment & Plan:  Sinusitis/Cough Zithromax 500mg /Tussionex suspension

## 2013-07-08 ENCOUNTER — Other Ambulatory Visit: Payer: Self-pay | Admitting: Family Medicine

## 2013-07-08 NOTE — Telephone Encounter (Signed)
Last OV 03-07-12 (arm/back pain) Med filed 01-09-13 #90 with 1  No upcoming appts.

## 2013-07-08 NOTE — Telephone Encounter (Signed)
Will approve but pt needs to schedule CPE

## 2013-07-25 ENCOUNTER — Ambulatory Visit (INDEPENDENT_AMBULATORY_CARE_PROVIDER_SITE_OTHER): Payer: BC Managed Care – PPO | Admitting: Family Medicine

## 2013-07-25 VITALS — BP 110/70 | HR 99 | Temp 98.3°F | Resp 16 | Ht 71.5 in | Wt 268.6 lb

## 2013-07-25 DIAGNOSIS — G43011 Migraine without aura, intractable, with status migrainosus: Secondary | ICD-10-CM

## 2013-07-25 DIAGNOSIS — R51 Headache: Secondary | ICD-10-CM

## 2013-07-25 MED ORDER — TRAMADOL HCL 50 MG PO TABS: 50.0000 mg | ORAL_TABLET | Freq: Four times a day (QID) | ORAL | Status: DC | PRN

## 2013-07-25 MED ORDER — NABUMETONE 750 MG PO TABS: 750.0000 mg | ORAL_TABLET | Freq: Two times a day (BID) | ORAL | Status: DC

## 2013-07-25 NOTE — Patient Instructions (Signed)
Take Relafen 750 mg one twice daily for headache  Take tramadol 50 mg one every 4-6 hours as needed for headache not relieved by the Relafen  Return if not improving by Sunday or Monday  Stay off work tomorrow

## 2013-07-25 NOTE — Progress Notes (Signed)
Subjective: Patient has had a headache since Tuesday. It hurts on the left side of his head behind his left ear. The pain has gone down into the left side of his neck occasionally. It is a shooting pain, comes and goes. He feels a little fuzzy like he has cotton balls behind his eyes he says. No nausea or vomiting. He has not had headaches like this in the past. He is not on any major medications. He's tried some OTC meds without relief. Aleve did not help today.  Objective: TMs normal. Eyes PERRLA. EOMs intact. Fundi benign discs flat. Throat clear. Neck supple without nodes or thyromegaly. No carotid bruits. Chest clear. Heart regular without murmurs. Vitals are stable. Cranial nerves intact. His gait is normal. Tandem walk was a little unsteady. Romberg negative.  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 Tramadol 50mg  every 4-6 hours as needed for worse headache. Return for recheck if not improving in which case he might need imaging. 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

## 2013-08-10 ENCOUNTER — Other Ambulatory Visit: Payer: Self-pay | Admitting: Family Medicine

## 2013-08-12 NOTE — Telephone Encounter (Signed)
Ok for #30, but needs to schedule an appt- has not been seen in over a year and is WELL overdue for CPE

## 2013-08-12 NOTE — Telephone Encounter (Signed)
Last OV 03-07-12 Meloxicam last filled 09-18-12 #30 with 3  No upcoming appts.

## 2013-08-13 NOTE — Telephone Encounter (Signed)
Med filled and letter mailed to pt to schedule physical.

## 2014-01-10 ENCOUNTER — Other Ambulatory Visit: Payer: Self-pay | Admitting: General Practice

## 2014-01-10 MED ORDER — LANSOPRAZOLE 30 MG PO CPDR: DELAYED_RELEASE_CAPSULE | ORAL | Status: DC

## 2014-01-10 NOTE — Telephone Encounter (Signed)
Med filled. appt needed for refills.

## 2014-01-14 ENCOUNTER — Encounter: Payer: Self-pay | Admitting: General Practice

## 2014-02-04 ENCOUNTER — Ambulatory Visit (INDEPENDENT_AMBULATORY_CARE_PROVIDER_SITE_OTHER): Payer: BLUE CROSS/BLUE SHIELD | Admitting: Internal Medicine

## 2014-02-04 VITALS — BP 122/80 | HR 98 | Temp 98.2°F | Resp 20 | Ht 72.25 in | Wt 250.2 lb

## 2014-02-04 DIAGNOSIS — J200 Acute bronchitis due to Mycoplasma pneumoniae: Secondary | ICD-10-CM

## 2014-02-04 DIAGNOSIS — J0141 Acute recurrent pansinusitis: Secondary | ICD-10-CM

## 2014-02-04 MED ORDER — AZITHROMYCIN 500 MG PO TABS: 500.0000 mg | ORAL_TABLET | Freq: Every day | ORAL | Status: DC

## 2014-02-04 MED ORDER — HYDROCOD POLST-CHLORPHEN POLST 10-8 MG/5ML PO LQCR: 5.0000 mL | Freq: Two times a day (BID) | ORAL | Status: DC | PRN

## 2014-02-04 NOTE — Patient Instructions (Signed)
Cough, Adult  A cough is a reflex that helps clear your throat and airways. It can help heal the body or may be a reaction to an irritated airway. A cough may only last 2 or 3 weeks (acute) or may last more than 8 weeks (chronic).  CAUSES Acute cough:  Viral or bacterial infections. Chronic cough:  Infections.  Allergies.  Asthma.  Post-nasal drip.  Smoking.  Heartburn or acid reflux.  Some medicines.  Chronic lung problems (COPD).  Cancer. SYMPTOMS   Cough.  Fever.  Chest pain.  Increased breathing rate.  High-pitched whistling sound when breathing (wheezing).  Colored mucus that you cough up (sputum). TREATMENT   A bacterial cough may be treated with antibiotic medicine.  A viral cough must run its course and will not respond to antibiotics.  Your caregiver may recommend other treatments if you have a chronic cough. HOME CARE INSTRUCTIONS   Only take over-the-counter or prescription medicines for pain, discomfort, or fever as directed by your caregiver. Use cough suppressants only as directed by your caregiver.  Use a cold steam vaporizer or humidifier in your bedroom or home to help loosen secretions.  Sleep in a semi-upright position if your cough is worse at night.  Rest as needed.  Stop smoking if you smoke. SEEK IMMEDIATE MEDICAL CARE IF:   You have pus in your sputum.  Your cough starts to worsen.  You cannot control your cough with suppressants and are losing sleep.  You begin coughing up blood.  You have difficulty breathing.  You develop pain which is getting worse or is uncontrolled with medicine.  You have a fever. MAKE SURE YOU:   Understand these instructions.  Will watch your condition.  Will get help right away if you are not doing well or get worse. Document Released: 07/16/2010 Document Revised: 04/11/2011 Document Reviewed: 07/16/2010 [facility] Patient Information 2015 [place], [place]. This information is not intended  to replace advice given to you by your health care provider. Make sure you discuss any questions you have with your health care provider. Sinusitis Sinusitis is redness, soreness, and inflammation of the paranasal sinuses. Paranasal sinuses are air pockets within the bones of your face (beneath the eyes, the middle of the forehead, or above the eyes). In healthy paranasal sinuses, mucus is able to drain out, and air is able to circulate through them by way of your nose. However, when your paranasal sinuses are inflamed, mucus and air can become trapped. This can allow bacteria and other germs to grow and cause infection. Sinusitis can develop quickly and last only a short time (acute) or continue over a long period (chronic). Sinusitis that lasts for more than 12 weeks is considered chronic.  CAUSES  Causes of sinusitis include:  Allergies.  Structural abnormalities, such as displacement of the cartilage that separates your nostrils (deviated septum), which can decrease the air flow through your nose and sinuses and affect sinus drainage.  Functional abnormalities, such as when the small hairs (cilia) that line your sinuses and help remove mucus do not work properly or are not present. SIGNS AND SYMPTOMS  Symptoms of acute and chronic sinusitis are the same. The primary symptoms are pain and pressure around the affected sinuses. Other symptoms include:  Upper toothache.  Earache.  Headache.  Bad breath.  Decreased sense of smell and taste.  A cough, which worsens when you are lying flat.  Fatigue.  Fever.  Thick drainage from your nose, which often is green and  may contain pus (purulent).  Swelling and warmth over the affected sinuses. DIAGNOSIS  Your health care provider will perform a physical exam. During the exam, your health care provider may:  Look in your nose for signs of abnormal growths in your nostrils (nasal polyps).  Tap over the affected sinus to check for signs  of infection.  View the inside of your sinuses (endoscopy) using an imaging device that has a light attached (endoscope). If your health care provider suspects that you have chronic sinusitis, one or more of the following tests may be recommended:  Allergy tests.  Nasal culture. A sample of mucus is taken from your nose, sent to a lab, and screened for bacteria.  Nasal cytology. A sample of mucus is taken from your nose and examined by your health care provider to determine if your sinusitis is related to an allergy. TREATMENT  Most cases of acute sinusitis are related to a viral infection and will resolve on their own within 10 days. Sometimes medicines are prescribed to help relieve symptoms (pain medicine, decongestants, nasal steroid sprays, or saline sprays).  However, for sinusitis related to a bacterial infection, your health care provider will prescribe antibiotic medicines. These are medicines that will help kill the bacteria causing the infection.  Rarely, sinusitis is caused by a fungal infection. In theses cases, your health care provider will prescribe antifungal medicine. For some cases of chronic sinusitis, surgery is needed. Generally, these are cases in which sinusitis recurs more than 3 times per year, despite other treatments. HOME CARE INSTRUCTIONS   Drink plenty of water. Water helps thin the mucus so your sinuses can drain more easily.  Use a humidifier.  Inhale steam 3 to 4 times a day (for example, sit in the bathroom with the shower running).  Apply a warm, moist washcloth to your face 3 to 4 times a day, or as directed by your health care provider.  Use saline nasal sprays to help moisten and clean your sinuses.  Take medicines only as directed by your health care provider.  If you were prescribed either an antibiotic or antifungal medicine, finish it all even if you start to feel better. SEEK IMMEDIATE MEDICAL CARE IF:  You have increasing pain or severe  headaches.  You have nausea, vomiting, or drowsiness.  You have swelling around your face.  You have vision problems.  You have a stiff neck.  You have difficulty breathing. MAKE SURE YOU:   Understand these instructions.  Will watch your condition.  Will get help right away if you are not doing well or get worse. Document Released: 01/17/2005 Document Revised: 06/03/2013 Document Reviewed: 02/01/2011 [facility] Patient Information 2015 [place], [place]. This information is not intended to replace advice given to you by your health care provider. Make sure you discuss any questions you have with your health care provider.

## 2014-02-04 NOTE — Progress Notes (Signed)
   Subjective:    Patient ID: Thomas [name], male    DOB: [birth year], 54 y.o.   MRN: [id number]  HPI Has 1 week of cough, yellow sputum, facial and head ache. No sob, cp. Never smoked   Review of Systems     Objective:   Physical Exam  Constitutional: He is oriented to person, place, and time. He appears well-developed and well-nourished.  HENT:  Head: Normocephalic.  Right Ear: External ear normal.  Left Ear: External ear normal.  Nose: Mucosal edema, rhinorrhea and sinus tenderness present. Right sinus exhibits frontal sinus tenderness. Left sinus exhibits maxillary sinus tenderness and frontal sinus tenderness.  Mouth/Throat: Oropharynx is clear and moist.  Eyes: Conjunctivae and EOM are normal. Pupils are equal, round, and reactive to light.  Neck: Normal range of motion. Neck supple.  Cardiovascular: Normal rate.   Pulmonary/Chest: Effort normal and breath sounds normal.  Neurological: He is alert and oriented to person, place, and time. He exhibits normal muscle tone. Coordination normal.          Assessment & Plan:  Sinusitis/Cough/HA

## 2014-04-11 ENCOUNTER — Other Ambulatory Visit: Payer: Self-pay | Admitting: General Practice

## 2014-04-11 NOTE — Telephone Encounter (Signed)
Med denied, pt has not bee seen since 2014.

## 2014-04-14 ENCOUNTER — Other Ambulatory Visit: Payer: Self-pay | Admitting: General Practice

## 2014-04-14 NOTE — Telephone Encounter (Signed)
Med denied, pt needs an appt.

## 2014-04-22 ENCOUNTER — Ambulatory Visit (INDEPENDENT_AMBULATORY_CARE_PROVIDER_SITE_OTHER): Payer: BLUE CROSS/BLUE SHIELD | Admitting: Family Medicine

## 2014-04-22 ENCOUNTER — Encounter: Payer: Self-pay | Admitting: Family Medicine

## 2014-04-22 VITALS — BP 120/70 | HR 96 | Temp 98.0°F | Wt 261.2 lb

## 2014-04-22 DIAGNOSIS — K219 Gastro-esophageal reflux disease without esophagitis: Secondary | ICD-10-CM

## 2014-04-22 DIAGNOSIS — J329 Chronic sinusitis, unspecified: Secondary | ICD-10-CM

## 2014-04-22 MED ORDER — LANSOPRAZOLE 30 MG PO CPDR: DELAYED_RELEASE_CAPSULE | ORAL | Status: DC

## 2014-04-22 NOTE — Assessment & Plan Note (Signed)
Per pt Use otc steroid nasal spray-- ie flonase, rhinocort and otc antihistamine Call or rto prn

## 2014-04-22 NOTE — Patient Instructions (Signed)
Esophagitis Esophagitis is inflammation of the esophagus. It can involve swelling, soreness, and pain in the esophagus. This condition can make it difficult and painful to swallow. CAUSES  Most causes of esophagitis are not serious. Many different factors can cause esophagitis, including:  Gastroesophageal reflux disease (GERD). This is when acid from your stomach flows up into the esophagus.  Recurrent vomiting.  An allergic-type reaction.  Certain medicines, especially those that come in large pills.  Ingestion of harmful chemicals, such as household cleaning products.  Heavy alcohol use.  An infection of the esophagus.  Radiation treatment for cancer.  Certain diseases such as sarcoidosis, Crohn's disease, and scleroderma. These diseases may cause recurrent esophagitis. SYMPTOMS   Trouble swallowing.  Painful swallowing.  Chest pain.  Difficulty breathing.  Nausea.  Vomiting.  Abdominal pain. DIAGNOSIS  Your caregiver will take your history and do a physical exam. Depending upon what your caregiver finds, certain tests may also be done, including:  Barium X-ray. You will drink a solution that coats the esophagus, and X-rays will be taken.  Endoscopy. A lighted tube is put down the esophagus so your caregiver can examine the area.  Allergy tests. These can sometimes be arranged through follow-up visits. TREATMENT  Treatment will depend on the cause of your esophagitis. In some cases, steroids or other medicines may be given to help relieve your symptoms or to treat the underlying cause of your condition. Medicines that may be recommended include:  Viscous lidocaine, to soothe the esophagus.  Antacids.  Acid reducers.  Proton pump inhibitors.  Antiviral medicines for certain viral infections of the esophagus.  Antifungal medicines for certain fungal infections of the esophagus.  Antibiotic medicines, depending on the cause of the esophagitis. HOME CARE  INSTRUCTIONS   Avoid foods and drinks that seem to make your symptoms worse.  Eat small, frequent meals instead of large meals.  Avoid eating for the 3 hours prior to your bedtime.  If you have trouble taking pills, use a pill splitter to decrease the size and likelihood of the pill getting stuck or injuring the esophagus on the way down. Drinking water after taking a pill also helps.  Stop smoking if you smoke.  Maintain a healthy weight.  Wear loose-fitting clothing. Do not wear anything tight around your waist that causes pressure on your stomach.  Raise the head of your bed 6 to 8 inches with wood blocks to help you sleep. Extra pillows will not help.  Only take over-the-counter or prescription medicines as directed by your caregiver. SEEK IMMEDIATE MEDICAL CARE IF:  You have severe chest pain that radiates into your arm, neck, or jaw.  You feel sweaty, dizzy, or lightheaded.  You have shortness of breath.  You vomit blood.  You have difficulty or pain with swallowing.  You have bloody or black, tarry stools.  You have a fever.  You have a burning sensation in the chest more than 3 times a week for more than 2 weeks.  You cannot swallow, drink, or eat.  You drool because you cannot swallow your saliva. MAKE SURE YOU:  Understand these instructions.  Will watch your condition.  Will get help right away if you are not doing well or get worse. Document Released: 02/25/2004 Document Revised: 04/11/2011 Document Reviewed: 09/17/2010 [employer] Patient Information 2015 [employer]. This information is not intended to replace advice given to you by your health care provider. Make sure you discuss any questions you have with your health care provider.  

## 2014-04-22 NOTE — Progress Notes (Signed)
Pre visit review using our clinic review tool, if applicable. No additional management support is needed unless otherwise documented below in the visit note. 

## 2014-04-22 NOTE — Progress Notes (Signed)
Patient ID: Thomas [name], male    DOB: [birth year]  Age: 54 y.o. MRN: [id number]    Subjective:  Subjective HPI RAMELLO CORDIAL presents for f/u prevacid rx--- he also wants his lungs and sinuses checked.  He is not having any symptoms at this time  Review of Systems  Constitutional: Negative.   HENT: Negative for congestion, ear pain, hearing loss, nosebleeds, postnasal drip, rhinorrhea, sinus pressure, sneezing and tinnitus.   Eyes: Negative for photophobia, discharge, itching and visual disturbance.  Respiratory: Negative.   Cardiovascular: Negative.   Gastrointestinal: Negative for abdominal pain, constipation, blood in stool, abdominal distention and anal bleeding.  Endocrine: Negative.   Genitourinary: Negative.   Musculoskeletal: Negative.   Skin: Negative.   Allergic/Immunologic: Negative.   Neurological: Negative for dizziness, weakness, light-headedness, numbness and headaches.  Psychiatric/Behavioral: Negative for suicidal ideas, confusion, sleep disturbance, dysphoric mood, decreased concentration and agitation. The patient is not nervous/anxious.     History Past Medical History  Diagnosis Date  . GERD (gastroesophageal reflux disease)     He has past surgical history that includes Partial colectomy.   His family history includes Diabetes in an other family member.He reports that he has never smoked. He has never used smokeless tobacco. He reports that he does not drink alcohol or use illicit drugs.  Current Outpatient Prescriptions on File Prior to Visit  Medication Sig Dispense Refill  . meloxicam (MOBIC) 7.5 MG tablet TAKE 1 TABLET (7.5 MG TOTAL) BY MOUTH DAILY. 30 tablet 0   No current facility-administered medications on file prior to visit.     Objective:  Objective Physical Exam  Constitutional: He is oriented to person, place, and time. Vital signs are normal. He appears well-developed and well-nourished. He is sleeping.  HENT:  Head: Normocephalic  and atraumatic.  Right Ear: External ear normal.  Left Ear: External ear normal.  Mouth/Throat: Oropharynx is clear and moist.  Eyes: EOM are normal. Pupils are equal, round, and reactive to light.  Neck: Normal range of motion. Neck supple. No thyromegaly present.  Cardiovascular: Normal rate and regular rhythm.   No murmur heard. Pulmonary/Chest: Effort normal and breath sounds normal. No respiratory distress. He has no wheezes. He has no rales. He exhibits no tenderness.  Musculoskeletal: He exhibits no edema or tenderness.  Lymphadenopathy:    He has no cervical adenopathy.  Neurological: He is alert and oriented to person, place, and time.  Skin: Skin is warm and dry.  Psychiatric: He has a normal mood and affect. His behavior is normal. Judgment and thought content normal.   BP 120/70 mmHg  Pulse 96  Temp(Src) 98 F (36.7 C) (Oral)  Wt 261 lb 3.2 oz (118.48 kg)  SpO2 96% Wt Readings from Last 3 Encounters:  04/22/14 261 lb 3.2 oz (118.48 kg)  02/04/14 250 lb 3.2 oz (113.49 kg)  07/25/13 268 lb 9.6 oz (121.836 kg)     Lab Results  Component Value Date   WBC 9.7 03/11/2007   HGB 14.8 POINT OF CARE RESULT 09/11/2007   HCT 32.0* 03/11/2007   PLT 273 03/11/2007   GLUCOSE 138* 03/10/2007   ALT 49 03/02/2007   AST 25 03/02/2007   NA 136 03/10/2007   K  03/10/2007    4.9 RESULT REPEATED AND VERIFIED DELTA CHECK NOTED MODERATE HEMOLYSIS   CL 104 03/10/2007   CREATININE 1.52* 03/10/2007   BUN 8 03/10/2007   CO2 27 03/10/2007    Ct Abdomen Pelvis W Contrast  07/04/2011   *  RADIOLOGY REPORT*  Clinical Data: Lower abdominal pain for several months, history of prior colon surgery for diverticulitis  CT ABDOMEN AND PELVIS WITH CONTRAST  Technique:  Multidetector CT imaging of the abdomen and pelvis was performed following the standard protocol during bolus administration of intravenous contrast.  Contrast: 139mL OMNIPAQUE IOHEXOL 300 MG/ML  SOLN  Comparison: CT abdomen pelvis of  01/27/2007  Findings: The lung bases are clear.  The liver enhances with no focal abnormality and no ductal dilatation is seen.  No calcified gallstones are noted.  The pancreas is normal in size and the pancreatic duct is not dilated.  The adrenal glands and spleen are unremarkable.  The stomach is moderately fluid distended.  The kidneys enhance with no calculus or mass and no hydronephrosis is seen. On delayed images the pelvocaliceal systems appear normal. The abdominal aorta is normal in caliber.  No adenopathy is seen.  The urinary bladder is not well distended.  The prostate is within normal limits in size for age.  No fluid is seen within the pelvis. A ring of sutures is noted within the rectosigmoid colon from prior resection.  There are a few scattered diverticula within the sigmoid colon and within the descending colon but no diverticulitis is noted.  The terminal ileum appears normal as does the appendix.  IMPRESSION:  1.  No explanation for the patient's pain is seen. 2.  There are diverticula within the descending colon and sigmoid colon but no diverticulitis is noted.  Original Report Authenticated By: Joretta Bachelor, M.D.    Assessment & Plan:  Plan I have discontinued Mr. Hickam traMADol, nabumetone, azithromycin, and chlorpheniramine-HYDROcodone. I am also having him maintain his meloxicam and lansoprazole.  Meds ordered this encounter  Medications  . DISCONTD: lansoprazole (PREVACID) 30 MG capsule    Sig: TAKE 1 CAPSULE (30 MG TOTAL) BY MOUTH DAILY.    Dispense:  90 capsule    Refill:  3  . lansoprazole (PREVACID) 30 MG capsule    Sig: TAKE 1 CAPSULE (30 MG TOTAL) BY MOUTH DAILY.    Dispense:  90 capsule    Refill:  3    Problem List Items Addressed This Visit    GERD - Primary   Relevant Medications   lansoprazole (PREVACID) capsule   Recurrent sinusitis    Per pt Use otc steroid nasal spray-- ie flonase, rhinocort and otc antihistamine Call or rto prn          Follow-up: Return if symptoms worsen or fail to improve, for cpe.  Garnet Koyanagi, DO

## 2014-07-29 ENCOUNTER — Ambulatory Visit (INDEPENDENT_AMBULATORY_CARE_PROVIDER_SITE_OTHER): Payer: BLUE CROSS/BLUE SHIELD | Admitting: Family Medicine

## 2014-07-29 ENCOUNTER — Encounter: Payer: Self-pay | Admitting: Family Medicine

## 2014-07-29 VITALS — BP 130/84 | HR 99 | Temp 99.3°F | Resp 18 | Ht 72.25 in | Wt 267.4 lb

## 2014-07-29 DIAGNOSIS — J069 Acute upper respiratory infection, unspecified: Secondary | ICD-10-CM | POA: Diagnosis not present

## 2014-07-29 DIAGNOSIS — J209 Acute bronchitis, unspecified: Secondary | ICD-10-CM

## 2014-07-29 MED ORDER — ALBUTEROL SULFATE HFA 108 (90 BASE) MCG/ACT IN AERS: 2.0000 | INHALATION_SPRAY | Freq: Four times a day (QID) | RESPIRATORY_TRACT | Status: DC | PRN

## 2014-07-29 MED ORDER — LORATADINE 10 MG PO TABS
10.0000 mg | ORAL_TABLET | Freq: Every day | ORAL | Status: DC
Start: 2014-07-29 — End: 2014-08-21

## 2014-07-29 MED ORDER — AZITHROMYCIN 250 MG PO TABS
ORAL_TABLET | ORAL | Status: DC
Start: 2014-07-29 — End: 2014-08-21

## 2014-07-29 MED ORDER — PROMETHAZINE-DM 6.25-15 MG/5ML PO SYRP: 5.0000 mL | ORAL_SOLUTION | Freq: Four times a day (QID) | ORAL | Status: DC | PRN

## 2014-07-29 MED ORDER — IPRATROPIUM-ALBUTEROL 0.5-2.5 (3) MG/3ML IN SOLN
3.0000 mL | Freq: Once | RESPIRATORY_TRACT | Status: AC
  Administered 2014-07-29: 3 mL via RESPIRATORY_TRACT

## 2014-07-29 NOTE — Progress Notes (Signed)
Pre visit review using our clinic review tool, if applicable. No additional management support is needed unless otherwise documented below in the visit note. 

## 2014-07-29 NOTE — Progress Notes (Signed)
  Subjective:     Thomas [name] is a 54 y.o. male who presents for evaluation of sinus pain. Symptoms include: congestion, cough, facial pain, headaches, nasal congestion and sinus pressure. Onset of symptoms was 1 day ago. Symptoms have been gradually worsening since that time. Past history is significant for no history of pneumonia or bronchitis. Patient is a non-smoker.  The following portions of the patient's history were reviewed and updated as appropriate:  He  has a past medical history of GERD (gastroesophageal reflux disease). He  does not have any pertinent problems on file. He  has past surgical history that includes Partial colectomy. His family history includes Diabetes in an other family member. He  reports that he has never smoked. He has never used smokeless tobacco. He reports that he does not drink alcohol or use illicit drugs. He has a current medication list which includes the following prescription(s): lansoprazole and meloxicam. Current Outpatient Prescriptions on File Prior to Visit  Medication Sig Dispense Refill  . lansoprazole (PREVACID) 30 MG capsule TAKE 1 CAPSULE (30 MG TOTAL) BY MOUTH DAILY. 90 capsule 3  . meloxicam (MOBIC) 7.5 MG tablet TAKE 1 TABLET (7.5 MG TOTAL) BY MOUTH DAILY. 30 tablet 0   No current facility-administered medications on file prior to visit.   He has No Known Allergies..  Review of Systems Pertinent items are noted in HPI.   Objective:    BP 130/84 mmHg  Pulse 99  Temp(Src) 99.3 F (37.4 C) (Oral)  Resp 18  Ht 6' 0.25" (1.835 m)  Wt 267 lb 6.4 oz (121.292 kg)  BMI 36.02 kg/m2  SpO2 98% General appearance: alert, cooperative, appears stated age and no distress Ears: normal TM's and external ear canals both ears Nose: green discharge, moderate congestion, turbinates red, swollen, sinus tenderness bilateral Throat: lips, mucosa, and tongue normal; teeth and gums normal Neck: mild anterior cervical adenopathy, supple,  symmetrical, trachea midline and thyroid not enlarged, symmetric, no tenderness/mass/nodules Lungs: clear to auscultation bilaterally Heart: S1, S2 normal    Assessment:    Acute bacterial sinusitis.    Plan:    Nasal steroids per medication orders. Antihistamines per medication orders. Zithromax per medication orders.

## 2014-07-29 NOTE — Patient Instructions (Signed)
Upper Respiratory Infection, Adult An upper respiratory infection (URI) is also sometimes known as the common cold. The upper respiratory tract includes the nose, sinuses, throat, trachea, and bronchi. Bronchi are the airways leading to the lungs. Most people improve within 1 week, but symptoms can last up to 2 weeks. A residual cough may last even longer.  CAUSES Many different viruses can infect the tissues lining the upper respiratory tract. The tissues become irritated and inflamed and often become very moist. Mucus production is also common. A cold is contagious. You can easily spread the virus to others by oral contact. This includes kissing, sharing a glass, coughing, or sneezing. Touching your mouth or nose and then touching a surface, which is then touched by another person, can also spread the virus. SYMPTOMS  Symptoms typically develop 1 to 3 days after you come in contact with a cold virus. Symptoms vary from person to person. They may include:  Runny nose.  Sneezing.  Nasal congestion.  Sinus irritation.  Sore throat.  Loss of voice (laryngitis).  Cough.  Fatigue.  Muscle aches.  Loss of appetite.  Headache.  Low-grade fever. DIAGNOSIS  You might diagnose your own cold based on familiar symptoms, since most people get a cold 2 to 3 times a year. Your caregiver can confirm this based on your exam. Most importantly, your caregiver can check that your symptoms are not due to another disease such as strep throat, sinusitis, pneumonia, asthma, or epiglottitis. Blood tests, throat tests, and X-rays are not necessary to diagnose a common cold, but they may sometimes be helpful in excluding other more serious diseases. Your caregiver will decide if any further tests are required. RISKS AND COMPLICATIONS  You may be at risk for a more severe case of the common cold if you smoke cigarettes, have chronic heart disease (such as heart failure) or lung disease (such as asthma), or if  you have a weakened immune system. The very young and very old are also at risk for more serious infections. Bacterial sinusitis, middle ear infections, and bacterial pneumonia can complicate the common cold. The common cold can worsen asthma and chronic obstructive pulmonary disease (COPD). Sometimes, these complications can require emergency medical care and may be life-threatening. PREVENTION  The best way to protect against getting a cold is to practice good hygiene. Avoid oral or hand contact with people with cold symptoms. Wash your hands often if contact occurs. There is no clear evidence that vitamin C, vitamin E, echinacea, or exercise reduces the chance of developing a cold. However, it is always recommended to get plenty of rest and practice good nutrition. TREATMENT  Treatment is directed at relieving symptoms. There is no cure. Antibiotics are not effective, because the infection is caused by a virus, not by bacteria. Treatment may include:  Increased fluid intake. Sports drinks offer valuable electrolytes, sugars, and fluids.  Breathing heated mist or steam (vaporizer or shower).  Eating chicken soup or other clear broths, and maintaining good nutrition.  Getting plenty of rest.  Using gargles or lozenges for comfort.  Controlling fevers with ibuprofen or acetaminophen as directed by your caregiver.  Increasing usage of your inhaler if you have asthma. Zinc gel and zinc lozenges, taken in the first 24 hours of the common cold, can shorten the duration and lessen the severity of symptoms. Pain medicines may help with fever, muscle aches, and throat pain. A variety of non-prescription medicines are available to treat congestion and runny nose. Your caregiver   can make recommendations and may suggest nasal or lung inhalers for other symptoms.  HOME CARE INSTRUCTIONS   Only take over-the-counter or prescription medicines for pain, discomfort, or fever as directed by your  caregiver.  Use a warm mist humidifier or inhale steam from a shower to increase air moisture. This may keep secretions moist and make it easier to breathe.  Drink enough water and fluids to keep your urine clear or pale yellow.  Rest as needed.  Return to work when your temperature has returned to normal or as your caregiver advises. You may need to stay home longer to avoid infecting others. You can also use a face mask and careful hand washing to prevent spread of the virus. SEEK MEDICAL CARE IF:   After the first few days, you feel you are getting worse rather than better.  You need your caregiver's advice about medicines to control symptoms.  You develop chills, worsening shortness of breath, or brown or red sputum. These may be signs of pneumonia.  You develop yellow or brown nasal discharge or pain in the face, especially when you bend forward. These may be signs of sinusitis.  You develop a fever, swollen neck glands, pain with swallowing, or white areas in the back of your throat. These may be signs of strep throat. SEEK IMMEDIATE MEDICAL CARE IF:   You have a fever.  You develop severe or persistent headache, ear pain, sinus pain, or chest pain.  You develop wheezing, a prolonged cough, cough up blood, or have a change in your usual mucus (if you have chronic lung disease).  You develop sore muscles or a stiff neck. Document Released: 07/13/2000 Document Revised: 04/11/2011 Document Reviewed: 04/24/2013 Exit[employer] Patient Information 2015 [employer]. This information is not intended to replace advice given to you by your health care provider. Make sure you discuss any questions you have with your health care provider.  

## 2014-08-21 ENCOUNTER — Ambulatory Visit (INDEPENDENT_AMBULATORY_CARE_PROVIDER_SITE_OTHER): Payer: BLUE CROSS/BLUE SHIELD | Admitting: Family Medicine

## 2014-08-21 ENCOUNTER — Ambulatory Visit (INDEPENDENT_AMBULATORY_CARE_PROVIDER_SITE_OTHER): Payer: BLUE CROSS/BLUE SHIELD

## 2014-08-21 VITALS — BP 130/78 | HR 95 | Temp 98.4°F | Resp 18 | Ht 70.5 in | Wt 267.0 lb

## 2014-08-21 DIAGNOSIS — R109 Unspecified abdominal pain: Secondary | ICD-10-CM

## 2014-08-21 DIAGNOSIS — K219 Gastro-esophageal reflux disease without esophagitis: Secondary | ICD-10-CM

## 2014-08-21 DIAGNOSIS — R0600 Dyspnea, unspecified: Secondary | ICD-10-CM

## 2014-08-21 DIAGNOSIS — Z0000 Encounter for general adult medical examination without abnormal findings: Secondary | ICD-10-CM | POA: Diagnosis not present

## 2014-08-21 DIAGNOSIS — Z23 Encounter for immunization: Secondary | ICD-10-CM

## 2014-08-21 LAB — COMPLETE METABOLIC PANEL WITH GFR
ALT: 40 U/L (ref 0–53)
AST: 23 U/L (ref 0–37)
Albumin: 4.3 g/dL (ref 3.5–5.2)
Alkaline Phosphatase: 102 U/L (ref 39–117)
BUN: 15 mg/dL (ref 6–23)
CO2: 25 mEq/L (ref 19–32)
Calcium: 9.6 mg/dL (ref 8.4–10.5)
Chloride: 104 mEq/L (ref 96–112)
Creat: 1.09 mg/dL (ref 0.50–1.35)
GFR, Est African American: 88 mL/min
GFR, Est Non African American: 76 mL/min
Glucose, Bld: 158 mg/dL — ABNORMAL HIGH (ref 70–99)
Potassium: 4.3 mEq/L (ref 3.5–5.3)
Sodium: 138 mEq/L (ref 135–145)
Total Bilirubin: 0.8 mg/dL (ref 0.2–1.2)
Total Protein: 7.5 g/dL (ref 6.0–8.3)

## 2014-08-21 LAB — POCT SEDIMENTATION RATE: POCT SED RATE: 22 mm/hr (ref 0–22)

## 2014-08-21 LAB — POCT CBC
Granulocyte percent: 51.1 %G (ref 37–80)
HCT, POC: 43.7 % (ref 43.5–53.7)
Hemoglobin: 14.9 g/dL (ref 14.1–18.1)
Lymph, poc: 2.7 (ref 0.6–3.4)
MCH, POC: 29.8 pg (ref 27–31.2)
MCHC: 34.1 g/dL (ref 31.8–35.4)
MCV: 87.6 fL (ref 80–97)
MID (cbc): 0.7 (ref 0–0.9)
MPV: 7 fL (ref 0–99.8)
POC Granulocyte: 3.5 (ref 2–6.9)
POC LYMPH PERCENT: 39.3 %L (ref 10–50)
POC MID %: 9.6 %M (ref 0–12)
Platelet Count, POC: 328 10*3/uL (ref 142–424)
RBC: 4.99 M/uL (ref 4.69–6.13)
RDW, POC: 12.7 %
WBC: 6.8 10*3/uL (ref 4.6–10.2)

## 2014-08-21 LAB — POCT URINALYSIS DIPSTICK
Bilirubin, UA: NEGATIVE
Blood, UA: NEGATIVE
Glucose, UA: NEGATIVE
Ketones, UA: NEGATIVE
Leukocytes, UA: NEGATIVE
Nitrite, UA: NEGATIVE
Spec Grav, UA: >=1.03
Urobilinogen, UA: 0.2
pH, UA: 5.5

## 2014-08-21 LAB — LIPID PANEL
Cholesterol: 195 mg/dL (ref 0–200)
HDL: 31 mg/dL — ABNORMAL LOW (ref >=40)
LDL Cholesterol: 132 mg/dL — ABNORMAL HIGH (ref 0–99)
Total CHOL/HDL Ratio: 6.3 Ratio
Triglycerides: 162 mg/dL — ABNORMAL HIGH (ref <150)
VLDL: 32 mg/dL (ref 0–40)

## 2014-08-21 LAB — POC HEMOCCULT BLD/STL (OFFICE/1-CARD/DIAGNOSTIC): Fecal Occult Blood, POC: NEGATIVE

## 2014-08-21 LAB — TSH: TSH: 1.56 u[IU]/mL (ref 0.350–4.500)

## 2014-08-21 MED ORDER — RANITIDINE HCL 150 MG PO TABS: 150.0000 mg | ORAL_TABLET | Freq: Two times a day (BID) | ORAL | Status: DC

## 2014-08-21 MED ORDER — HYOSCYAMINE SULFATE 0.125 MG SL SUBL: 0.1250 mg | SUBLINGUAL_TABLET | SUBLINGUAL | Status: DC | PRN

## 2014-08-21 NOTE — Progress Notes (Addendum)
This chart was scribed for Thomas Haber, MD by Thomas [name], medical scribe at [facility].The patient was seen in exam room 8 and the patient's care was started at 9:05 AM.  Patient ID: Thomas [name] MRN: [id number], DOB: 05/04/1960, 54 y.o. Date of Encounter: 08/21/2014  Primary Physician: Thomas Asa, MD  Chief Complaint:  Chief Complaint  Patient presents with   Annual Exam    HPI:  Thomas [name] is a 54 y.o. male who presents to [facility] for complete physical exam.  Pt states that he has a sore throat with associated lightheadedness for 3 weeks. He went to see another doctor for bad sinus press. He's had some breathing problems for a while. They gave him a treatment and was put back onto his inhaler. Since then, he has been feeling fatigue.   He also notes that he has not been sleeping well and noticed some snoring. He has never had a sleep study done.   He noticed a small sore on his bottom lip 2 days ago. He has been stressed the past few days.   He also informs having some spasms in his abdomen. 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 This doesn't subside until he takes something for inflammation.   He usually goes to the gym but has not due to hurting his right thumb by accident. He sees Dr. Rory Percy for orthopedics.   He mentions that his last colonoscopy was a few months ago.  His mother had throat cancer and it moved to lung cancer. She died of brain cancer.   He denies any UTI symptoms.  He denies any new sexual partner.   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 He is about to retire from [employer].    Past Medical History  Diagnosis Date   GERD (gastroesophageal reflux disease)    Allergy      Home Meds: Prior to Admission medications   Medication Sig Start Date End Date Taking? Authorizing Provider  albuterol (PROAIR HFA) 108 (90  BASE) MCG/ACT inhaler Inhale 2 puffs into the lungs every 6 (six) hours as needed for wheezing or shortness of breath. 07/29/14  Yes Thomas R Lowne, DO  lansoprazole (PREVACID) 30 MG capsule TAKE 1 CAPSULE (30 MG TOTAL) BY MOUTH DAILY. 04/22/14  Yes Thomas R Lowne, DO  loratadine (CLARITIN) 10 MG tablet Take 1 tablet (10 mg total) by mouth daily. 07/29/14  Yes Thomas R Lowne, DO  meloxicam (MOBIC) 7.5 MG tablet TAKE 1 TABLET (7.5 MG TOTAL) BY MOUTH DAILY.   Yes Thomas Minium, MD  azithromycin (ZITHROMAX Z-PAK) 250 MG tablet As directed Patient not taking: Reported on 08/21/2014 07/29/14   Thomas Chessman, DO  promethazine-dextromethorphan (PROMETHAZINE-DM) 6.25-15 MG/5ML syrup Take 5 mLs by mouth 4 (four) times daily as needed for cough. Patient not taking: Reported on 08/21/2014 07/29/14   Thomas Chessman, DO    Allergies: No Known Allergies  History   Social History   Marital Status: Married    Spouse Name: N/A   Number of Children: N/A   Years of Education: N/A   Occupational History   Not on file.   Social History Main Topics   Smoking status: Never Smoker    Smokeless tobacco: Never Used   Alcohol Use: No   Drug Use: No   Sexual Activity: Not on file  Other Topics Concern   Not on file   Social History Narrative     Review of Systems: Constitutional: negative for chills, fever, positive for night sweats HEENT: negative for vision changes, hearing loss, congestion, rhinorrhea, ST, epistaxis Cardiovascular: negative for chest pain or palpitations Respiratory: Positive for shortness of breath Abdominal: Positive for constipation, intermittent left-sided abdominal pain, small bulge in the hypogastrium where he had his sigmoid colectomy Dermatological: negative for rash Neurologic: negative for headache, syncope;  positive for lightheadedness at times GU: Positive for premature ejaculation   Physical Exam: [name] pressure 130/78, pulse 95, temperature 98.4 F  (36.9 C), temperature source Oral, resp. rate 18, height 5' 10.5" (1.791 m), weight 267 lb (121.11 kg), SpO2 98 %., Body mass index is 37.76 kg/(m^2). General: Well developed, well nourished, in no acute distress. Head: Normocephalic, atraumatic, eyes without discharge, sclera non-icteric, nares are without discharge. Bilateral auditory canals clear, TM's are without perforation, pearly grey and translucent with reflective cone of light bilaterally. Oral cavity moist, posterior pharynx without exudate, erythema, peritonsillar abscess, or post nasal drip.  Neck: Supple. No thyromegaly. Full ROM. No lymphadenopathy. Lungs: Clear bilaterally to auscultation without wheezes, rales, or rhonchi. Breathing is unlabored. Heart: RRR with S1 S2. No murmurs, rubs, or gallops appreciated. Abdomen: Soft, non-tender, non-distended with normoactive bowel sounds. No hepatomegaly. No rebound/guarding. No obvious abdominal masses. Msk:  Strength and tone normal for age. Extremities/Skin: Warm and dry. No clubbing or cyanosis. No edema. No rashes or suspicious lesions. Neuro: Alert and oriented X 3. Moves all extremities spontaneously. Gait is normal. CNII-XII grossly in tact. Psych:  Responds to questions appropriately with a normal affect.   Labs: Results for orders placed or performed in visit on 08/21/14  POCT CBC  Result Value Ref Range   WBC 6.8 4.6 - 10.2 K/uL   Lymph, poc 2.7 0.6 - 3.4   POC LYMPH PERCENT 39.3 10 - 50 %L   MID (cbc) 0.7 0 - 0.9   POC MID % 9.6 0 - 12 %M   POC Granulocyte 3.5 2 - 6.9   Granulocyte percent 51.1 37 - 80 %G   RBC 4.99 4.69 - 6.13 M/uL   Hemoglobin 14.9 14.1 - 18.1 g/dL   HCT, POC 43.7 43.5 - 53.7 %   MCV 87.6 80 - 97 fL   MCH, POC 29.8 27 - 31.2 pg   MCHC 34.1 31.8 - 35.4 g/dL   RDW, POC 12.7 %   Platelet Count, POC 328 142 - 424 K/uL   MPV 7.0 0 - 99.8 fL  POCT SEDIMENTATION RATE  Result Value Ref Range   POCT SED RATE 22 0 - 22 mm/hr  POCT urinalysis dipstick    Result Value Ref Range   Color, UA amber    Clarity, UA clear    Glucose, UA neg    Bilirubin, UA neg    Ketones, UA neg    Spec Grav, UA >=1.030    [name], UA neg    pH, UA 5.5    Protein, UA trace    Urobilinogen, UA 0.2    Nitrite, UA neg    Leukocytes, UA Negative Negative  POC Hemoccult Bld/Stl (1-Cd Office Dx)  Result Value Ref Range   Card #1 Date     Fecal Occult [name], POC Negative Negative   [facility] reading (PRIMARY) by  Dr. Joseph Art:  Normal chest x-ray.  EKG: Normal sinus rhythm  ASSESSMENT AND PLAN:  54 y.o. year old male with  This chart was scribed in my presence and reviewed by me personally.    ICD-9-CM ICD-10-CM   1. Annual physical exam V70.0 Z00.00 POCT CBC     POCT SEDIMENTATION RATE     POCT urinalysis dipstick     COMPLETE METABOLIC PANEL WITH GFR     Lipid panel     Testosterone, Free, Total, SHBG     TSH     PSA     POC Hemoccult Bld/Stl (1-Cd Office Dx)     Tdap vaccine greater than or equal to 7yo IM     CANCELED: IFOBT POC (occult bld, rslt in office)  2. Dyspnea 786.09 R06.00 EKG 12-Lead     DG Chest 2 View  3. Gastroesophageal reflux disease without esophagitis 530.81 K21.9 ranitidine (ZANTAC) 150 MG tablet  4. Abdominal cramping 789.00 R10.9 hyoscyamine (LEVSIN/SL) 0.125 MG SL tablet       No diagnosis found.  Signed, Thomas Haber, MD 08/21/2014 9:02 AM

## 2014-08-21 NOTE — Patient Instructions (Signed)
1: Try taking Zantac consider Protonix for the acid reflux

## 2014-08-22 ENCOUNTER — Encounter: Payer: Self-pay | Admitting: Family Medicine

## 2014-08-22 ENCOUNTER — Other Ambulatory Visit: Payer: Self-pay | Admitting: Family Medicine

## 2014-08-22 DIAGNOSIS — E669 Obesity, unspecified: Secondary | ICD-10-CM

## 2014-08-22 LAB — PSA: PSA: 1.47 ng/mL (ref <=4.00)

## 2014-09-29 ENCOUNTER — Encounter: Payer: Self-pay | Admitting: Dietician

## 2014-09-29 ENCOUNTER — Encounter: Payer: BLUE CROSS/BLUE SHIELD | Attending: Family Medicine | Admitting: Dietician

## 2014-09-29 DIAGNOSIS — E669 Obesity, unspecified: Secondary | ICD-10-CM

## 2014-09-29 DIAGNOSIS — Z713 Dietary counseling and surveillance: Secondary | ICD-10-CM | POA: Insufficient documentation

## 2014-09-29 DIAGNOSIS — Z6837 Body mass index (BMI) 37.0-37.9, adult: Secondary | ICD-10-CM | POA: Diagnosis not present

## 2014-09-29 NOTE — Progress Notes (Signed)
  Medical Nutrition Therapy:  Appt start time: 205 end time:  250   Assessment:  Primary concerns today: Thomas [name] states that he is here today to discuss high blood sugar. It was 158 mg/dL fasting at his doctor appointment in July. He attributes the high reading to just coming home from vacation. Thomas [name] reports that he does not have diabetes or problems with blood pressure. He has since stopped eating "white foods" and sugar. Has been using Stevia and honey. Not eating cereal but eats oatmeal. He states he has lost about 15 pounds in the last 2 months. He lives with his wife; they both grocery shop and cook. He would like to weigh 215-220 lbs. Had a sigmoid colectomy and has some spasms and discomfort. Blood glucose today was 102 mg/dL 4 hours post prandial.    Preferred Learning Style:   No preference indicated   Learning Readiness:  Ready  Change in progress   MEDICATIONS: see list   DIETARY INTAKE:  Usual eating pattern includes 3 meals and 2 snacks per day. Avoided foods include "white foods," a lot of beef, most fried foods.    24-hr recall: *Wakes up around 3am and goes to work around 4am  B (3:30-3:45 AM): yogurt, applesauce, and/or an egg  Snk ( AM): Graham crackers and peanut butter  L ( PM): small salad with raspberry vinaigrette, apple, and carrots Snk ( PM): Graham crackers and peanut butter D ( PM): poultry or fish with string beans or greens Snk ( PM): none, cranberry juice  Beverages: cranberry juice, water, 1 cup coffee with creamer and stevia or 1 tsp honey per day   Usual physical activity: walking, yardwork  Estimated energy needs: 1800-2000 calories 200-225 g carbohydrates 135-150 g protein 50-56 g fat  Progress Towards Goal(s):  In progress.   Nutritional Diagnosis:  [place]-2.2 Altered nutrition-related laboratory As related to overweight and inappropriate food choices.  As evidenced by elevated fasting blood sugar.    Intervention:  Nutrition  counseling provided. Discussed the Mediterranean Diet for heart health and reduced inflammation. Encouraged high fiber, low sugar carbohydrates.   Teaching Method Utilized:  Visual Auditory Hands on  Handouts given during visit include:  Mediterranean Diet  Barriers to learning/adherence to lifestyle change: none  Demonstrated degree of understanding via:  Teach Back   Monitoring/Evaluation:  Dietary intake, exercise, and body weight prn.

## 2014-10-16 ENCOUNTER — Telehealth: Payer: Self-pay

## 2014-10-16 NOTE — Telephone Encounter (Signed)
Spoke to pt. I advised him that Dr. Brett Fairy is out of the office on Monday. He is agreeable to a new appt time on 10/17 at 9:30. He requested Mondays only.

## 2014-10-20 ENCOUNTER — Institutional Professional Consult (permissible substitution): Payer: BLUE CROSS/BLUE SHIELD | Admitting: Neurology

## 2014-11-17 ENCOUNTER — Institutional Professional Consult (permissible substitution): Payer: Self-pay | Admitting: Neurology

## 2014-12-12 ENCOUNTER — Telehealth: Payer: Self-pay | Admitting: Family Medicine

## 2014-12-12 NOTE — Telephone Encounter (Signed)
Tammy, [place] [phone], called to notify provider that back in July patient was given rx for hycosamine(Levsin/SL) 0.125 mg sublingual tablet that it was filled as Nulev ODT. The strength and everything was the same with the exception of this one was ODT. They are now changing back to the original rx, sublingual tablet.

## 2014-12-22 HISTORY — PX: COLONOSCOPY: SHX174

## 2014-12-22 LAB — HM COLONOSCOPY: HM Colonoscopy: NORMAL

## 2014-12-31 ENCOUNTER — Encounter: Payer: Self-pay | Admitting: General Practice

## 2015-04-24 ENCOUNTER — Ambulatory Visit (INDEPENDENT_AMBULATORY_CARE_PROVIDER_SITE_OTHER): Payer: BLUE CROSS/BLUE SHIELD

## 2015-04-24 ENCOUNTER — Ambulatory Visit (INDEPENDENT_AMBULATORY_CARE_PROVIDER_SITE_OTHER): Payer: BLUE CROSS/BLUE SHIELD | Admitting: Family Medicine

## 2015-04-24 VITALS — BP 114/84 | HR 103 | Temp 98.4°F | Resp 18 | Ht 71.0 in | Wt 258.0 lb

## 2015-04-24 DIAGNOSIS — R1012 Left upper quadrant pain: Secondary | ICD-10-CM

## 2015-04-24 DIAGNOSIS — J0100 Acute maxillary sinusitis, unspecified: Secondary | ICD-10-CM

## 2015-04-24 DIAGNOSIS — K219 Gastro-esophageal reflux disease without esophagitis: Secondary | ICD-10-CM

## 2015-04-24 LAB — POCT URINALYSIS DIP (MANUAL ENTRY)
Bilirubin, UA: NEGATIVE
Blood, UA: NEGATIVE
Glucose, UA: NEGATIVE
Leukocytes, UA: NEGATIVE
Nitrite, UA: NEGATIVE
Spec Grav, UA: 1.015
Urobilinogen, UA: 0.2
pH, UA: 6

## 2015-04-24 LAB — POC MICROSCOPIC URINALYSIS (UMFC): Mucus: ABSENT

## 2015-04-24 LAB — POCT CBC
Granulocyte percent: 63.1 %G (ref 37–80)
HCT, POC: 41.6 % — AB (ref 43.5–53.7)
Hemoglobin: 15.1 g/dL (ref 14.1–18.1)
Lymph, poc: 3.2 (ref 0.6–3.4)
MCH, POC: 33 pg — AB (ref 27–31.2)
MCHC: 36.4 g/dL — AB (ref 31.8–35.4)
MCV: 90.6 fL (ref 80–97)
MID (cbc): 0.8 (ref 0–0.9)
MPV: 6.9 fL (ref 0–99.8)
POC Granulocyte: 6.8 (ref 2–6.9)
POC LYMPH PERCENT: 29.9 %L (ref 10–50)
POC MID %: 7 %M (ref 0–12)
Platelet Count, POC: 309 10*3/uL (ref 142–424)
RBC: 4.59 M/uL — AB (ref 4.69–6.13)
RDW, POC: 12.8 %
WBC: 10.8 10*3/uL — AB (ref 4.6–10.2)

## 2015-04-24 MED ORDER — LANSOPRAZOLE 30 MG PO CPDR: DELAYED_RELEASE_CAPSULE | ORAL | Status: DC

## 2015-04-24 MED ORDER — AMOXICILLIN-POT CLAVULANATE 875-125 MG PO TABS: 1.0000 | ORAL_TABLET | Freq: Two times a day (BID) | ORAL | Status: DC

## 2015-04-24 MED ORDER — RANITIDINE HCL 300 MG PO TABS: 300.0000 mg | ORAL_TABLET | Freq: Every day | ORAL | Status: DC

## 2015-04-24 NOTE — Patient Instructions (Addendum)
Please call me Sunday to report how the symptoms are doing. The x-rays look very healthy and your blood count suggest that you have a mild infection that consistent with sinus problem and cough. I don't have a good explanation for your left upper quadrant pain although this is commonly seen when people get some degree of constipation. I'm hoping that with the antibiotics, things settled down. In addition, I believe that alternating the 2 reflux esophagitis medicines will be safer and more likely to help with the reflux symptoms.   Acute Bronchitis Bronchitis is inflammation of the airways that extend from the windpipe into the lungs (bronchi). The inflammation often causes mucus to develop. This leads to a cough, which is the most common symptom of bronchitis.  In acute bronchitis, the condition usually develops suddenly and goes away over time, usually in a couple weeks. Smoking, allergies, and asthma can make bronchitis worse. Repeated episodes of bronchitis may cause further lung problems.  CAUSES Acute bronchitis is most often caused by the same virus that causes a cold. The virus can spread from person to person (contagious) through coughing, sneezing, and touching contaminated objects. SIGNS AND SYMPTOMS   Cough.   Fever.   Coughing up mucus.   Body aches.   Chest congestion.   Chills.   Shortness of breath.   Sore throat.  DIAGNOSIS  Acute bronchitis is usually diagnosed through a physical exam. Your health care provider will also ask you questions about your medical history. Tests, such as chest X-rays, are sometimes done to rule out other conditions.  TREATMENT  Acute bronchitis usually goes away in a couple weeks. Oftentimes, no medical treatment is necessary. Medicines are sometimes given for relief of fever or cough. Antibiotic medicines are usually not needed but may be prescribed in certain situations. In some cases, an inhaler may be recommended to help reduce  shortness of breath and control the cough. A cool mist vaporizer may also be used to help thin bronchial secretions and make it easier to clear the chest.  HOME CARE INSTRUCTIONS  Get plenty of rest.   Drink enough fluids to keep your urine clear or pale yellow (unless you have a medical condition that requires fluid restriction). Increasing fluids may help thin your respiratory secretions (sputum) and reduce chest congestion, and it will prevent dehydration.   Take medicines only as directed by your health care provider.  If you were prescribed an antibiotic medicine, finish it all even if you start to feel better.  Avoid smoking and secondhand smoke. Exposure to cigarette smoke or irritating chemicals will make bronchitis worse. If you are a smoker, consider using nicotine gum or skin patches to help control withdrawal symptoms. Quitting smoking will help your lungs heal faster.   Reduce the chances of another bout of acute bronchitis by washing your hands frequently, avoiding people with cold symptoms, and trying not to touch your hands to your mouth, nose, or eyes.   Keep all follow-up visits as directed by your health care provider.  SEEK MEDICAL CARE IF: Your symptoms do not improve after 1 week of treatment.  SEEK IMMEDIATE MEDICAL CARE IF:  You develop an increased fever or chills.   You have chest pain.   You have severe shortness of breath.  You have bloody sputum.   You develop dehydration.  You faint or repeatedly feel like you are going to pass out.  You develop repeated vomiting.  You develop a severe headache. MAKE SURE YOU:  Understand these instructions.  Will watch your condition.  Will get help right away if you are not doing well or get worse.   This information is not intended to replace advice given to you by your health care provider. Make sure you discuss any questions you have with your health care provider.   Document Released:  02/25/2004 Document Revised: 02/07/2014 Document Reviewed: 07/10/2012 Elsevier Interactive Patient Education 2016 Elsevier Inc. Sinusitis, Adult Sinusitis is redness, soreness, and inflammation of the paranasal sinuses. Paranasal sinuses are air pockets within the bones of your face. They are located beneath your eyes, in the middle of your forehead, and above your eyes. In healthy paranasal sinuses, mucus is able to drain out, and air is able to circulate through them by way of your nose. However, when your paranasal sinuses are inflamed, mucus and air can become trapped. This can allow bacteria and other germs to grow and cause infection. Sinusitis can develop quickly and last only a short time (acute) or continue over a long period (chronic). Sinusitis that lasts for more than 12 weeks is considered chronic. CAUSES Causes of sinusitis include:  Allergies.  Structural abnormalities, such as displacement of the cartilage that separates your nostrils (deviated septum), which can decrease the air flow through your nose and sinuses and affect sinus drainage.  Functional abnormalities, such as when the small hairs (cilia) that line your sinuses and help remove mucus do not work properly or are not present. SIGNS AND SYMPTOMS Symptoms of acute and chronic sinusitis are the same. The primary symptoms are pain and pressure around the affected sinuses. Other symptoms include:  Upper toothache.  Earache.  Headache.  Bad breath.  Decreased sense of smell and taste.  A cough, which worsens when you are lying flat.  Fatigue.  Fever.  Thick drainage from your nose, which often is green and may contain pus (purulent).  Swelling and warmth over the affected sinuses. DIAGNOSIS Your health care provider will perform a physical exam. During your exam, your health care provider may perform any of the following to help determine if you have acute sinusitis or chronic sinusitis:  Look in your nose  for signs of abnormal growths in your nostrils (nasal polyps).  Tap over the affected sinus to check for signs of infection.  View the inside of your sinuses using an imaging device that has a light attached (endoscope). If your health care provider suspects that you have chronic sinusitis, one or more of the following tests may be recommended:  Allergy tests.  Nasal culture. A sample of mucus is taken from your nose, sent to a lab, and screened for bacteria.  Nasal cytology. A sample of mucus is taken from your nose and examined by your health care provider to determine if your sinusitis is related to an allergy. TREATMENT Most cases of acute sinusitis are related to a viral infection and will resolve on their own within 10 days. Sometimes, medicines are prescribed to help relieve symptoms of both acute and chronic sinusitis. These may include pain medicines, decongestants, nasal steroid sprays, or saline sprays. However, for sinusitis related to a bacterial infection, your health care provider will prescribe antibiotic medicines. These are medicines that will help kill the bacteria causing the infection. Rarely, sinusitis is caused by a fungal infection. In these cases, your health care provider will prescribe antifungal medicine. For some cases of chronic sinusitis, surgery is needed. Generally, these are cases in which sinusitis recurs more than 3 times per  year, despite other treatments. HOME CARE INSTRUCTIONS  Drink plenty of water. Water helps thin the mucus so your sinuses can drain more easily.  Use a humidifier.  Inhale steam 3-4 times a day (for example, sit in the bathroom with the shower running).  Apply a warm, moist washcloth to your face 3-4 times a day, or as directed by your health care provider.  Use saline nasal sprays to help moisten and clean your sinuses.  Take medicines only as directed by your health care provider.  If you were prescribed either an antibiotic  or antifungal medicine, finish it all even if you start to feel better. SEEK IMMEDIATE MEDICAL CARE IF:  You have increasing pain or severe headaches.  You have nausea, vomiting, or drowsiness.  You have swelling around your face.  You have vision problems.  You have a stiff neck.  You have difficulty breathing.   This information is not intended to replace advice given to you by your health care provider. Make sure you discuss any questions you have with your health care provider.   Document Released: 01/17/2005 Document Revised: 02/07/2014 Document Reviewed: 02/01/2011 Elsevier Interactive Patient Education [employer].

## 2015-04-24 NOTE — Progress Notes (Signed)
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 He's had a history of diverticulitis with partial colectomy. Presents with a week of sharp pain in the left upper quadrant.  Patient also has reflux esophagitis and is not well controlled on Zantac after he was switched from a pantoprazole.  Patient also has a cough and wheezing for the last couple weeks. He's not had a fever and the phlegm is clear.  Objective: Healthy-appearing individual with no acute distress BP 114/84 mmHg  Pulse 103  Temp(Src) 98.4 F (36.9 C)  Resp 18  Ht 5\' 11"  (1.803 m)  Wt 258 lb (117.028 kg)  BMI 36.00 kg/m2  SpO2 98% HEENT: Unremarkable no icterus Chest: Few faint expiratory wheezes Heart: Regular no murmur Abdomen: Minimally tender until I press underneath the lower ribs in the left upper quadrant at which time he does have some discomfort. There is no hepatosplenomegaly. Skin: No rash X-rays of the abdomen and chest show any acute problems. Results for orders placed or performed in visit on 04/24/15  POCT CBC  Result Value Ref Range   WBC 10.8 (A) 4.6 - 10.2 K/uL   Lymph, poc 3.2 0.6 - 3.4   POC LYMPH PERCENT 29.9 10 - 50 %L   MID (cbc) 0.8 0 - 0.9   POC MID % 7.0 0 - 12 %M   POC Granulocyte 6.8 2 - 6.9   Granulocyte percent 63.1 37 - 80 %G   RBC 4.59 (A) 4.69 - 6.13 M/uL   Hemoglobin 15.1 14.1 - 18.1 g/dL   HCT, POC 41.6 (A) 43.5 - 53.7 %   MCV 90.6 80 - 97 fL   MCH, POC 33.0 (A) 27 - 31.2 pg   MCHC 36.4 (A) 31.8 - 35.4 g/dL   RDW, POC 12.8 %   Platelet Count, POC 309 142 - 424 K/uL   MPV 6.9 0 - 99.8 fL  POCT urinalysis dipstick  Result Value Ref Range   Color, UA yellow yellow   Clarity, UA clear clear   Glucose, UA negative negative   Bilirubin, UA negative negative   Ketones, POC UA trace (5) (A) negative   Spec Grav, UA 1.015    Blood, UA negative negative   pH, UA 6.0    Protein Ur, POC trace (A) negative   Urobilinogen, UA 0.2    Nitrite, UA Negative Negative   Leukocytes, UA Negative Negative  POCT Microscopic Urinalysis ([facility])  Result Value Ref Range   WBC,UR,HPF,POC None None WBC/hpf   RBC,UR,HPF,POC None None RBC/hpf   Bacteria Few (A) None, Too numerous to count   Mucus Absent Absent   Epithelial Cells, UR Per Microscopy Few (A) None, Too numerous to count cells/hpf   Assessment: I do not see any sign of recurrent diverticulitis or infection in the colon Or gastrointestinal tract. He does have symptoms compatible with sinusitis and bronchitis. Also we need to do better job of controlling his reflux symptoms. We discussed weight loss as keep factor in the management of this. He'll be following up with Dr. Earlean Shawl when he is back in town from medication.     ICD-9-CM ICD-10-CM   1. Gastroesophageal reflux disease, esophagitis presence not specified 530.81 K21.9 lansoprazole (PREVACID) 30 MG capsule     ranitidine (ZANTAC) 300 MG tablet  2. Acute maxillary sinusitis, recurrence not specified 461.0 J01.00 amoxicillin-clavulanate (AUGMENTIN) 875-125 MG tablet  3. LUQ abdominal pain 789.02 R10.12 DG Abd Acute W/Chest     POCT CBC  COMPLETE METABOLIC PANEL WITH GFR     POCT urinalysis dipstick     POCT Microscopic Urinalysis ([facility])     Signed, [name], MD

## 2015-04-25 LAB — COMPLETE METABOLIC PANEL WITH GFR
ALT: 25 U/L (ref 9–46)
AST: 20 U/L (ref 10–35)
Albumin: 4.4 g/dL (ref 3.6–5.1)
Alkaline Phosphatase: 85 U/L (ref 40–115)
BUN: 14 mg/dL (ref 7–25)
CO2: 25 mmol/L (ref 20–31)
Calcium: 9.4 mg/dL (ref 8.6–10.3)
Chloride: 100 mmol/L (ref 98–110)
Creat: 1.05 mg/dL (ref 0.70–1.33)
GFR, Est African American: >89 mL/min (ref >=60)
GFR, Est Non African American: 79 mL/min (ref >=60)
Glucose, Bld: 92 mg/dL (ref 65–99)
Potassium: 4.2 mmol/L (ref 3.5–5.3)
Sodium: 137 mmol/L (ref 135–146)
Total Bilirubin: 0.7 mg/dL (ref 0.2–1.2)
Total Protein: 7.3 g/dL (ref 6.1–8.1)

## 2015-05-03 ENCOUNTER — Other Ambulatory Visit: Payer: Self-pay | Admitting: Family Medicine

## 2015-05-16 ENCOUNTER — Ambulatory Visit (INDEPENDENT_AMBULATORY_CARE_PROVIDER_SITE_OTHER): Payer: BLUE CROSS/BLUE SHIELD | Admitting: Family Medicine

## 2015-05-16 VITALS — BP 140/80 | HR 82 | Temp 98.0°F | Resp 18 | Ht 70.0 in | Wt 256.4 lb

## 2015-05-16 DIAGNOSIS — J209 Acute bronchitis, unspecified: Secondary | ICD-10-CM | POA: Diagnosis not present

## 2015-05-16 MED ORDER — LEVOFLOXACIN 500 MG PO TABS: 500.0000 mg | ORAL_TABLET | Freq: Every day | ORAL | Status: DC

## 2015-05-16 MED ORDER — HYDROCODONE-HOMATROPINE 5-1.5 MG/5ML PO SYRP: 5.0000 mL | ORAL_SOLUTION | Freq: Three times a day (TID) | ORAL | Status: DC | PRN

## 2015-05-16 MED ORDER — PREDNISONE 20 MG PO TABS: ORAL_TABLET | ORAL | Status: DC

## 2015-05-16 NOTE — Progress Notes (Signed)
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  Light yellow and foamy white phlegm.  Possible low grade fever.  Some myalgias.  No hemoptysis.  He's been using an inhaler and took all the medicine we prescribed three weeks ago.  Abdomen is better.  No recurrence of GERD either  Objective:  NAD BP 140/80 mmHg  Pulse 82  Temp(Src) 98 F (36.7 C) (Oral)  Resp 18  Ht 5\' 10"  (1.778 m)  Wt 256 lb 6 oz (116.291 kg)  BMI 36.79 kg/m2  SpO2 98% HEENT:  Swollen nasal passages.  oroph clear.  TM's clear Chest: bibasilar fine rales Heart:  Reg, no murmur   Acute bronchitis, unspecified organism - Plan: levofloxacin (LEVAQUIN) 500 MG tablet, predniSONE (DELTASONE) 20 MG tablet  [name], MD

## 2015-05-16 NOTE — Patient Instructions (Addendum)

## 2015-06-17 ENCOUNTER — Other Ambulatory Visit: Payer: Self-pay | Admitting: Family Medicine

## 2015-06-17 NOTE — Telephone Encounter (Signed)
Rx sent to the pharmacy by e-script.//AB/CMA 

## 2015-07-13 ENCOUNTER — Ambulatory Visit (INDEPENDENT_AMBULATORY_CARE_PROVIDER_SITE_OTHER): Payer: BLUE CROSS/BLUE SHIELD | Admitting: Family Medicine

## 2015-07-13 ENCOUNTER — Telehealth: Payer: Self-pay | Admitting: Family Medicine

## 2015-07-13 ENCOUNTER — Encounter: Payer: Self-pay | Admitting: Family Medicine

## 2015-07-13 VITALS — BP 150/86 | HR 99 | Temp 98.1°F | Resp 16 | Ht 70.0 in | Wt 257.5 lb

## 2015-07-13 DIAGNOSIS — M792 Neuralgia and neuritis, unspecified: Secondary | ICD-10-CM | POA: Diagnosis not present

## 2015-07-13 DIAGNOSIS — G5711 Meralgia paresthetica, right lower limb: Secondary | ICD-10-CM

## 2015-07-13 DIAGNOSIS — J45909 Unspecified asthma, uncomplicated: Secondary | ICD-10-CM | POA: Insufficient documentation

## 2015-07-13 DIAGNOSIS — J4540 Moderate persistent asthma, uncomplicated: Secondary | ICD-10-CM

## 2015-07-13 MED ORDER — BECLOMETHASONE DIPROPIONATE 80 MCG/ACT IN AERS: 1.0000 | INHALATION_SPRAY | Freq: Two times a day (BID) | RESPIRATORY_TRACT | Status: DC

## 2015-07-13 MED ORDER — PREDNISONE 10 MG PO TABS: ORAL_TABLET | ORAL | Status: DC

## 2015-07-13 NOTE — Telephone Encounter (Signed)
[place]  Patient Name: Thomas [name]  DOB: 28-Jun-1960    Initial Comment Caller is having numbness in his shoulder, arm, and thigh   Nurse Assessment  Nurse: Wayne Sever, RN, Tillie Rung Date/Time (Eastern Time): 07/13/2015 11:23:04 AM  Confirm and document reason for call. If symptomatic, describe symptoms. You must click the next button to save text entered. ---Caller states he has numbness in his top right thigh, he also has pain and numbness in his right shoulder and wrist. Caller states this started about 8 days ago.  Has the patient traveled out of the country within the last 30 days? ---Not Applicable  Does the patient have any new or worsening symptoms? ---Yes  Will a triage be completed? ---Yes  Related visit to physician within the last 2 weeks? ---No  Does the PT have any chronic conditions? (i.e. diabetes, asthma, etc.) ---No  Is this a behavioral health or substance abuse call? ---No     Guidelines    Guideline Title Affirmed Question Affirmed Notes  Neurologic Deficit Neck pain (and neurologic deficit)    Final Disposition User   See Physician within 4 Hours (or PCP triage) Wayne Sever, RN, Tillie Rung    Comments  Scheduled today at 200pm with Dr. Birdie Riddle   Referrals  REFERRED TO PCP OFFICE   Disagree/Comply: Comply

## 2015-07-13 NOTE — Progress Notes (Signed)
   Subjective:    Patient ID: Thomas [name], male    DOB: Apr 07, 1960, 55 y.o.   MRN: [id number]  HPI R shoulder pain- sxs started ~8 days ago.  No known injury.  Pain will radiate into R arm.  Difficult to find a comfortable position to sleep  Bronchitis- pt was seen 4/15 at [facility] and given Levaquin and Prednisone.  Hycodan cough syrup prn.  Pt denies hx of asthma but is using albuterol inhaler 3x/day.  + wheezing, productive cough.  + SOB.  Pt quit smoking 25 yrs ago.  Numbness of R thigh- thigh is numb anteriorly, 'right on top of the mucle'.  Nothing has improved or worsened numbness.  No leg weakness   Review of Systems For ROS see HPI     Objective:   Physical Exam  Constitutional: He is oriented to person, place, and time. He appears well-developed and well-nourished. No distress.  HENT:  Head: Normocephalic and atraumatic.  No TTP over sinuses + turbinate edema + PND TMs normal bilaterally  Eyes: Conjunctivae and EOM are normal. Pupils are equal, round, and reactive to light.  Neck: Normal range of motion. Neck supple.  Cardiovascular: Normal rate, regular rhythm and normal heart sounds.   Pulmonary/Chest: Effort normal and breath sounds normal. No respiratory distress. He has no wheezes.  Musculoskeletal:  Normal neck ROM Full ROM of shoulders bilaterally + R trap spasm (-) impingement signs  Lymphadenopathy:    He has no cervical adenopathy.  Neurological: He is alert and oriented to person, place, and time. No cranial nerve deficit. Coordination normal.  Skin: Skin is warm and dry.  Vitals reviewed.         Assessment & Plan:

## 2015-07-13 NOTE — Progress Notes (Signed)
Pre visit review using our clinic review tool, if applicable. No additional management support is needed unless otherwise documented below in the visit note. 

## 2015-07-13 NOTE — Patient Instructions (Signed)
Follow up in 2-3 weeks to recheck BP and R arm pain Start the Prednisone as directed- take w/ food (no other ibuprofen, aleve, advil, etc while on prednisone but you can add tylenol if needed) Make sure you are changing positions regularly to avoid putting pressure on that nerve all the time Start the daily Qvar- 1 puff twice daily regardless of how you feel Use the Albuterol only as needed Start daily Claritin or Zyrtec Call with any questions or concerns Hang in there!

## 2015-07-27 ENCOUNTER — Encounter: Payer: Self-pay | Admitting: Family Medicine

## 2015-07-27 ENCOUNTER — Ambulatory Visit (INDEPENDENT_AMBULATORY_CARE_PROVIDER_SITE_OTHER): Payer: BLUE CROSS/BLUE SHIELD | Admitting: Family Medicine

## 2015-07-27 VITALS — BP 129/86 | HR 82 | Temp 98.1°F | Resp 16 | Ht 71.0 in | Wt 258.4 lb

## 2015-07-27 DIAGNOSIS — M792 Neuralgia and neuritis, unspecified: Secondary | ICD-10-CM

## 2015-07-27 DIAGNOSIS — IMO0001 Reserved for inherently not codable concepts without codable children: Secondary | ICD-10-CM | POA: Insufficient documentation

## 2015-07-27 DIAGNOSIS — J4540 Moderate persistent asthma, uncomplicated: Secondary | ICD-10-CM

## 2015-07-27 DIAGNOSIS — R030 Elevated blood-pressure reading, without diagnosis of hypertension: Secondary | ICD-10-CM | POA: Insufficient documentation

## 2015-07-27 NOTE — Assessment & Plan Note (Signed)
Improved after course of prednisone and starting Qvar.  Advised pt that he is able to stop Qvar at this time but if he finds himself requiring albuterol more than 2-3x/week he will need to restart Qvar.  Pt expressed understanding and is in agreement w/ plan.

## 2015-07-27 NOTE — Progress Notes (Signed)
Pre visit review using our clinic review tool, if applicable. No additional management support is needed unless otherwise documented below in the visit note. 

## 2015-07-27 NOTE — Patient Instructions (Signed)
Schedule your complete physical in 6 months We'll call you with your orthopedic appt for the shoulder pain Continue tylenol or ibuprofen as needed for pain Continue the Qvar twice daily to prevent needing albuterol regularly Call with any questions or concerns Hang in there!!!

## 2015-07-27 NOTE — Assessment & Plan Note (Signed)
Pt continues to have R arm and shoulder pain despite course of prednisone.  He reports pain is better but still having difficulty sleeping at night.  Refer to ortho for complete evaluation and tx.  Pt expressed understanding and is in agreement w/ plan.

## 2015-07-27 NOTE — Assessment & Plan Note (Signed)
Improved today.  Asymptomatic.  No need for medication at this time.  Will follow.

## 2015-07-27 NOTE — Progress Notes (Signed)
   Subjective:    Patient ID: Thomas [name], male    DOB: 1960-07-10, 55 y.o.   MRN: [id number]  HPI RAD- pt reports wheezing has resolved.  SOB is much improved since completing Prednisone taper and now using Qvar.  Elevated BP- BP is back to normal.  No CP, SOB, HAs, visual changes.  R shoulder/neck pain- pt reports pain will travel up and down the arm.  Improved somewhat w/ prednisone but continues to have daily pain.  Worse at night- 'it aches'.  Interfering w/ sleep.   Review of Systems For ROS see HPI     Objective:   Physical Exam  Constitutional: He is oriented to person, place, and time. He appears well-developed and well-nourished. No distress.  HENT:  Head: Normocephalic and atraumatic.  Eyes: Conjunctivae and EOM are normal. Pupils are equal, round, and reactive to light.  Neck: Normal range of motion. Neck supple. No thyromegaly present.  Cardiovascular: Normal rate, regular rhythm, normal heart sounds and intact distal pulses.   No murmur heard. Pulmonary/Chest: Effort normal and breath sounds normal. No respiratory distress.  Abdominal: Soft. Bowel sounds are normal. He exhibits no distension.  Musculoskeletal: He exhibits no edema.  Lymphadenopathy:    He has no cervical adenopathy.  Neurological: He is alert and oriented to person, place, and time. No cranial nerve deficit.  Skin: Skin is warm and dry.  Psychiatric: He has a normal mood and affect. His behavior is normal.  Vitals reviewed.         Assessment & Plan:

## 2015-08-01 NOTE — Assessment & Plan Note (Signed)
New.  Pt's sxs consistent w/ RAD and he has been using his inhaler up to 3x/day.  Stressed that this was not safe for him and b/c of his excessive albuterol use, will start ICS.  Reviewed supportive care and red flags that should prompt return.  Pt expressed understanding and is in agreement w/ plan.

## 2015-08-01 NOTE — Assessment & Plan Note (Signed)
Recurrent problem for pt.  Start Prednisone taper.  Reviewed supportive care and red flags that should prompt return.  Pt expressed understanding and is in agreement w/ plan.

## 2015-08-01 NOTE — Assessment & Plan Note (Signed)
New.  Reviewed dx w/ pt.  Stressed need for abdominal weight loss as this is likely putting pressure on his nerve.  Reviewed supportive care and red flags that should prompt return.  Pt expressed understanding and is in agreement w/ plan.

## 2015-09-02 ENCOUNTER — Other Ambulatory Visit: Payer: Self-pay | Admitting: General Practice

## 2015-09-02 MED ORDER — BECLOMETHASONE DIPROPIONATE 80 MCG/ACT IN AERS: 1.0000 | INHALATION_SPRAY | Freq: Two times a day (BID) | RESPIRATORY_TRACT | 0 refills | Status: DC

## 2015-09-21 ENCOUNTER — Telehealth: Payer: Self-pay | Admitting: Family Medicine

## 2015-09-21 NOTE — Telephone Encounter (Signed)
Pt asking if KT would call in something for sinus, [facility] [address]

## 2015-09-21 NOTE — Telephone Encounter (Signed)
Pt can start Claritin or Zyrtec daily.  If no improvement in nasal congestion/pressure, will need appt

## 2015-09-21 NOTE — Telephone Encounter (Signed)
Pt called back an made an appt.

## 2015-09-22 ENCOUNTER — Ambulatory Visit (INDEPENDENT_AMBULATORY_CARE_PROVIDER_SITE_OTHER): Payer: BLUE CROSS/BLUE SHIELD | Admitting: Family Medicine

## 2015-09-22 ENCOUNTER — Encounter: Payer: Self-pay | Admitting: Family Medicine

## 2015-09-22 VITALS — BP 130/80 | HR 76 | Temp 98.2°F | Resp 17 | Ht 71.0 in | Wt 257.4 lb

## 2015-09-22 DIAGNOSIS — J0110 Acute frontal sinusitis, unspecified: Secondary | ICD-10-CM

## 2015-09-22 MED ORDER — AMOXICILLIN 875 MG PO TABS: 875.0000 mg | ORAL_TABLET | Freq: Two times a day (BID) | ORAL | 0 refills | Status: DC

## 2015-09-22 NOTE — Progress Notes (Signed)
   Subjective:    Patient ID: Thomas [name], male    DOB: [birth year], 55 y.o.   MRN: [id number]  HPI URI- sxs started ~2.5 weeks ago.  Not able to breathe through nose due to congestion.  No fevers.  + facial pressure.  No ear pain.  Minimal cough.  + PND, hoarseness.  Taking Zyrtec daily.  No known sick contacts.  + nausea, no vomiting.  + tooth pain.   Review of Systems For ROS see HPI     Objective:   Physical Exam  Constitutional: He is oriented to person, place, and time. He appears well-developed and well-nourished. No distress.  HENT:  Head: Normocephalic and atraumatic.  Right Ear: Tympanic membrane normal.  Left Ear: Tympanic membrane normal.  Nose: Mucosal edema and rhinorrhea present. Right sinus exhibits maxillary sinus tenderness and frontal sinus tenderness. Left sinus exhibits maxillary sinus tenderness and frontal sinus tenderness.  Mouth/Throat: Mucous membranes are normal. Oropharyngeal exudate and posterior oropharyngeal erythema present. No posterior oropharyngeal edema.  + PND  Eyes: Conjunctivae and EOM are normal. Pupils are equal, round, and reactive to light.  Neck: Normal range of motion. Neck supple.  Cardiovascular: Normal rate, regular rhythm and normal heart sounds.   Pulmonary/Chest: Effort normal and breath sounds normal. No respiratory distress. He has no wheezes.  Lymphadenopathy:    He has no cervical adenopathy.  Neurological: He is alert and oriented to person, place, and time.  Skin: Skin is warm and dry.  Vitals reviewed.         Assessment & Plan:  Acute sinusitis- pt's sxs and PE consistent w/ infxn.  Start abx.  Reviewed supportive care and red flags that should prompt return.  Pt expressed understanding and is in agreement w/ plan.

## 2015-09-22 NOTE — Progress Notes (Signed)
Pre visit review using our clinic review tool, if applicable. No additional management support is needed unless otherwise documented below in the visit note. 

## 2015-09-22 NOTE — Patient Instructions (Signed)
Follow up as needed Start the Amoxicillin twice daily- take w/ food Drink plenty of fluids Continue the Zyrtec daily- add Flonase for additional symptom relief Mucinex to thin your drainage REST! Call with any questions or concerns Happy Labor Day!!!

## 2015-09-25 ENCOUNTER — Telehealth: Payer: Self-pay | Admitting: Family Medicine

## 2015-09-25 NOTE — Telephone Encounter (Signed)
Add OTC Flonase- 2 sprays each nostril daily

## 2015-09-25 NOTE — Telephone Encounter (Signed)
He can add OTC Sudafed or Phenylephrine.  There are no prescription decongestants available

## 2015-09-25 NOTE — Telephone Encounter (Signed)
Advised patient per KT to try OTC Mucinex or Sudafed to help with decongestant. He is agreeable.

## 2015-09-25 NOTE — Telephone Encounter (Signed)
Pt states that he is still having a hard time breathing through his nose and asking if there is anything that would open up his sinuses, [facility] [address]

## 2015-09-25 NOTE — Telephone Encounter (Signed)
Pt states that he is already taking the zyrtec 24 hour and the flonase over the counter with the antibiotic. They are not working and he would like to know what will clear up his congestion?

## 2015-10-03 ENCOUNTER — Ambulatory Visit: Payer: BLUE CROSS/BLUE SHIELD

## 2015-10-03 ENCOUNTER — Ambulatory Visit (INDEPENDENT_AMBULATORY_CARE_PROVIDER_SITE_OTHER): Payer: Self-pay | Admitting: Physician Assistant

## 2015-10-03 VITALS — BP 110/68 | HR 84 | Temp 99.2°F | Wt 258.0 lb

## 2015-10-03 DIAGNOSIS — J329 Chronic sinusitis, unspecified: Secondary | ICD-10-CM

## 2015-10-03 MED ORDER — FLUTICASONE PROPIONATE 50 MCG/ACT NA SUSP: 2.0000 | Freq: Every day | NASAL | 6 refills | Status: DC

## 2015-10-03 MED ORDER — BENZONATATE 100 MG PO CAPS: 100.0000 mg | ORAL_CAPSULE | Freq: Two times a day (BID) | ORAL | 0 refills | Status: DC | PRN

## 2015-10-03 MED ORDER — METHYLPREDNISOLONE ACETATE 80 MG/ML IJ SUSP
80.0000 mg | Freq: Once | INTRAMUSCULAR | Status: AC
  Administered 2015-10-03: 80 mg via INTRAMUSCULAR

## 2015-10-03 MED ORDER — LEVOFLOXACIN 500 MG PO TABS: 500.0000 mg | ORAL_TABLET | Freq: Every day | ORAL | 0 refills | Status: DC

## 2015-10-03 NOTE — Progress Notes (Signed)
Patient presents to clinic today c/o 4 weeks of sinus pressure, sinus pain, PND and dry cough. Patient was seen 2 weeks ago by his PCP, Dr. Birdie [name] and was treated for acute sinusitis with Amoxicillin. Since that time, patient endorses no improvement in symptoms despite completion of antibiotics. Has been using Afrin daily x 2 weeks to help with congestion. States he feels nasal congestion is worse since using Afrin. Denies fever at home. Has been taking Ibuprofen for sinus pain. Has history of reactive airway disease for which he takes Qvar. Has continued BID as directed. Denies need for rescue inhaler. Denies chest pain. SOB only when coughing.   Past Medical History:  Diagnosis Date  . Allergy   . GERD (gastroesophageal reflux disease)     Current Outpatient Prescriptions on File Prior to Visit  Medication Sig Dispense Refill  . beclomethasone (QVAR) 80 MCG/ACT inhaler Inhale 1 puff into the lungs 2 (two) times daily. 3 Inhaler 0  . DUEXIS 800-26.6 MG TABS Take 1 (one) Tablet two times daily  3  . hyoscyamine (LEVBID) 0.375 MG 12 hr tablet Take 0.375 mg by mouth at bedtime.  11  . lansoprazole (PREVACID) 15 MG capsule Take 15 mg by mouth daily at 12 noon.    Marland Kitchen PROAIR HFA 108 (90 Base) MCG/ACT inhaler INHALE 2 PUFFS EVERY 6 HOURS AS NEEDED FOR WHEEZING OR SHORTNESS OF BREATH. 8.5 Inhaler 2  . ranitidine (ZANTAC) 300 MG tablet Take 1 tablet (300 mg total) by mouth at bedtime. Every other evening 45 tablet 3  . meloxicam (MOBIC) 7.5 MG tablet TAKE 1 TABLET (7.5 MG TOTAL) BY MOUTH DAILY. (Patient not taking: Reported on 10/03/2015) 30 tablet 0   No current facility-administered medications on file prior to visit.     No Known Allergies  Family History  Problem Relation Age of Onset  . Cancer Father   . Diabetes      maternal family    Social History   Social History  . Marital status: Married    Spouse name: N/A  . Number of children: N/A  . Years of education: N/A   Social  History Main Topics  . Smoking status: Never Smoker  . Smokeless tobacco: Never Used  . Alcohol use No  . Drug use: No  . Sexual activity: Not on file   Other Topics Concern  . Not on file   Social History Narrative  . No narrative on file    Review of Systems - See HPI.  All other ROS are negative.  There were no vitals taken for this visit.  Physical Exam  Constitutional: He is oriented to person, place, and time and well-developed, well-nourished, and in no distress.  HENT:  Head: Normocephalic and atraumatic.  Right Ear: Tympanic membrane normal.  Left Ear: Tympanic membrane normal.  Nose: Mucosal edema and rhinorrhea present. Right sinus exhibits maxillary sinus tenderness. Left sinus exhibits maxillary sinus tenderness.  Mouth/Throat: Uvula is midline, oropharynx is clear and moist and mucous membranes are normal.  Eyes: Conjunctivae are normal.  Neck: Neck supple.  Cardiovascular: Normal rate, regular rhythm, normal heart sounds and intact distal pulses.   Pulmonary/Chest: Breath sounds normal. No respiratory distress. He has no wheezes. He has no rales. He exhibits no tenderness.  Neurological: He is alert and oriented to person, place, and time.  Skin: Skin is warm and dry. No rash noted.  Psychiatric: Affect normal.  Vitals reviewed.   Assessment/Plan: 1. Recurrent sinusitis IM Depomedrol given to  help with sinus inflammation and to help with rebound congestion 2/2 prolonged nasal decongestant use. Discussed no further use of Afrin with this episode. In the future no more than three days of Afrin use total. Supportive measures reviewed. Will begin Levaquin 500 mg once daily for continued sinusitis. Rx Tessalon for cough and Flonase for nasal congestion and PND. Recommend he FU with PCP if symptoms are not improving with new regimen or if anything worsens.    Leeanne Rio, PA-C

## 2015-10-03 NOTE — Patient Instructions (Signed)
Please take antibiotic as directed.  Increase fluid intake.  Use Saline nasal spray.  Take a daily multivitamin. Use the Tessalon as directed for cough and the Flonase as directed for nasal congestion and PND. Continue your Qvar as directed.  Place a humidifier in the bedroom.     Follow-up with Dr. Birdie Riddle if symptoms are not resolving or if anything worsens.  Please no more than 3 days of Afrin in a row. Otherwise you get the rebound congestion you are experiencing now.  Sinusitis Sinusitis is redness, soreness, and swelling (inflammation) of the paranasal sinuses. Paranasal sinuses are air pockets within the bones of your face (beneath the eyes, the middle of the forehead, or above the eyes). In healthy paranasal sinuses, mucus is able to drain out, and air is able to circulate through them by way of your nose. However, when your paranasal sinuses are inflamed, mucus and air can become trapped. This can allow bacteria and other germs to grow and cause infection. Sinusitis can develop quickly and last only a short time (acute) or continue over a long period (chronic). Sinusitis that lasts for more than 12 weeks is considered chronic.  CAUSES  Causes of sinusitis include:  Allergies.  Structural abnormalities, such as displacement of the cartilage that separates your nostrils (deviated septum), which can decrease the air flow through your nose and sinuses and affect sinus drainage.  Functional abnormalities, such as when the small hairs (cilia) that line your sinuses and help remove mucus do not work properly or are not present. SYMPTOMS  Symptoms of acute and chronic sinusitis are the same. The primary symptoms are pain and pressure around the affected sinuses. Other symptoms include:  Upper toothache.  Earache.  Headache.  Bad breath.  Decreased sense of smell and taste.  A cough, which worsens when you are lying flat.  Fatigue.  Fever.  Thick drainage from your nose, which  often is green and may contain pus (purulent).  Swelling and warmth over the affected sinuses. DIAGNOSIS  Your caregiver will perform a physical exam. During the exam, your caregiver may:  Look in your nose for signs of abnormal growths in your nostrils (nasal polyps).  Tap over the affected sinus to check for signs of infection.  View the inside of your sinuses (endoscopy) with a special imaging device with a light attached (endoscope), which is inserted into your sinuses. If your caregiver suspects that you have chronic sinusitis, one or more of the following tests may be recommended:  Allergy tests.  Nasal culture A sample of mucus is taken from your nose and sent to a lab and screened for bacteria.  Nasal cytology A sample of mucus is taken from your nose and examined by your caregiver to determine if your sinusitis is related to an allergy. TREATMENT  Most cases of acute sinusitis are related to a viral infection and will resolve on their own within 10 days. Sometimes medicines are prescribed to help relieve symptoms (pain medicine, decongestants, nasal steroid sprays, or saline sprays).  However, for sinusitis related to a bacterial infection, your caregiver will prescribe antibiotic medicines. These are medicines that will help kill the bacteria causing the infection.  Rarely, sinusitis is caused by a fungal infection. In theses cases, your caregiver will prescribe antifungal medicine. For some cases of chronic sinusitis, surgery is needed. Generally, these are cases in which sinusitis recurs more than 3 times per year, despite other treatments. HOME CARE INSTRUCTIONS   Drink plenty of water.  Water helps thin the mucus so your sinuses can drain more easily.  Use a humidifier.  Inhale steam 3 to 4 times a day (for example, sit in the bathroom with the shower running).  Apply a warm, moist washcloth to your face 3 to 4 times a day, or as directed by your caregiver.  Use saline  nasal sprays to help moisten and clean your sinuses.  Take over-the-counter or prescription medicines for pain, discomfort, or fever only as directed by your caregiver. SEEK IMMEDIATE MEDICAL CARE IF:  You have increasing pain or severe headaches.  You have nausea, vomiting, or drowsiness.  You have swelling around your face.  You have vision problems.  You have a stiff neck.  You have difficulty breathing. MAKE SURE YOU:   Understand these instructions.  Will watch your condition.  Will get help right away if you are not doing well or get worse. Document Released: 01/17/2005 Document Revised: 04/11/2011 Document Reviewed: 02/01/2011 [facility] Patient Information 2014 [place], [place].

## 2015-12-10 ENCOUNTER — Encounter: Payer: Self-pay | Admitting: Family Medicine

## 2015-12-10 ENCOUNTER — Ambulatory Visit: Payer: BLUE CROSS/BLUE SHIELD | Admitting: Family Medicine

## 2015-12-10 ENCOUNTER — Ambulatory Visit (INDEPENDENT_AMBULATORY_CARE_PROVIDER_SITE_OTHER): Payer: BLUE CROSS/BLUE SHIELD | Admitting: Family Medicine

## 2015-12-10 VITALS — BP 132/87 | HR 103 | Temp 98.3°F | Ht 71.0 in | Wt 256.0 lb

## 2015-12-10 DIAGNOSIS — K529 Noninfective gastroenteritis and colitis, unspecified: Secondary | ICD-10-CM | POA: Diagnosis not present

## 2015-12-10 MED ORDER — ONDANSETRON HCL 8 MG PO TABS: 8.0000 mg | ORAL_TABLET | Freq: Four times a day (QID) | ORAL | 1 refills | Status: DC | PRN

## 2015-12-10 MED ORDER — DIPHENOXYLATE-ATROPINE 2.5-0.025 MG PO TABS: 2.0000 | ORAL_TABLET | Freq: Four times a day (QID) | ORAL | 1 refills | Status: DC | PRN

## 2015-12-10 MED ORDER — METRONIDAZOLE 500 MG PO TABS: 500.0000 mg | ORAL_TABLET | Freq: Three times a day (TID) | ORAL | 0 refills | Status: DC

## 2015-12-10 NOTE — Progress Notes (Signed)
   Subjective:    Patient ID: Thomas [name], male    DOB: Sep 22, 1960, 55 y.o.   MRN: [id number]  HPI Here for 3 days of mild generalized abdominal cramping, diarrhea, and nausea with vomiting. No fever. No recent antibiotic use and no recent travel. He has tried Imodium with little relief. Its hard to keep fluids down.    Review of Systems  Constitutional: Negative.   HENT: Negative.   Eyes: Negative.   Respiratory: Negative.   Cardiovascular: Negative.   Gastrointestinal: Positive for abdominal pain, diarrhea, nausea and vomiting. Negative for abdominal distention, anal bleeding, blood in stool, constipation and rectal pain.  Genitourinary: Negative.        Objective:   Physical Exam  Constitutional:  Alert, appears weak   HENT:  Right Ear: External ear normal.  Left Ear: External ear normal.  Nose: Nose normal.  Mouth/Throat: Oropharynx is clear and moist.  Eyes: Conjunctivae are normal. No scleral icterus.  Neck: No thyromegaly present.  Cardiovascular: Normal rate, regular rhythm, normal heart sounds and intact distal pulses.   Pulmonary/Chest: Effort normal and breath sounds normal.  Abdominal: Soft. Bowel sounds are normal. He exhibits no distension and no mass. There is no rebound and no guarding.  Slight diffuse tenderness   Lymphadenopathy:    He has no cervical adenopathy.          Assessment & Plan:  Enteritis, treat with Flagyl for 7 days. Use Zofran for nausea and Lomotil for diarrhea. Drink water and Gatorade as tolerated. Recheck prn.

## 2015-12-16 ENCOUNTER — Other Ambulatory Visit: Payer: Self-pay | Admitting: Family Medicine

## 2016-01-29 ENCOUNTER — Ambulatory Visit (INDEPENDENT_AMBULATORY_CARE_PROVIDER_SITE_OTHER): Payer: BLUE CROSS/BLUE SHIELD | Admitting: Family Medicine

## 2016-01-29 VITALS — BP 138/64 | HR 91 | Temp 98.3°F | Resp 16 | Ht 70.5 in | Wt 252.0 lb

## 2016-01-29 DIAGNOSIS — J4531 Mild persistent asthma with (acute) exacerbation: Secondary | ICD-10-CM

## 2016-01-29 DIAGNOSIS — J0191 Acute recurrent sinusitis, unspecified: Secondary | ICD-10-CM | POA: Diagnosis not present

## 2016-01-29 MED ORDER — CLARITHROMYCIN ER 500 MG PO TB24: 1000.0000 mg | ORAL_TABLET | Freq: Every day | ORAL | 0 refills | Status: DC

## 2016-01-29 MED ORDER — ALBUTEROL SULFATE (2.5 MG/3ML) 0.083% IN NEBU
2.5000 mg | INHALATION_SOLUTION | Freq: Once | RESPIRATORY_TRACT | Status: AC
Start: 2016-01-29 — End: 2016-01-29
  Administered 2016-01-29: 2.5 mg via RESPIRATORY_TRACT

## 2016-01-29 MED ORDER — METHYLPREDNISOLONE ACETATE 80 MG/ML IJ SUSP
80.0000 mg | Freq: Once | INTRAMUSCULAR | Status: AC
  Administered 2016-01-29: 80 mg via INTRAMUSCULAR

## 2016-01-29 MED ORDER — HYDROCOD POLST-CPM POLST ER 10-8 MG/5ML PO SUER: 5.0000 mL | Freq: Every evening | ORAL | 0 refills | Status: DC | PRN

## 2016-01-29 NOTE — Progress Notes (Signed)
By signing my name below, I, Mesha Guinyard, attest that this documentation has been prepared under the direction and in the presence of Delman Cheadle, MD.  Electronically Signed: Verlee Monte, Medical Scribe. 01/29/16. 10:38 AM.  Subjective:    Patient ID: Thomas [name], male    DOB: [birth year], 55 y.o.   MRN: [id number]  HPI Chief Complaint  Patient presents with  . Cough    productive green phelm x 3 days  . Sore Throat    HPI Comments: Thomas [name] is a 55 y.o. male with a PMHx of reactive airway diesease, and seasonal allergies who presents to the [facility] complaining of congestion and wet cough onset 4 days ago. He uses Qvar BID, and flonase QD, with PRN albuterol. He also has hx of recurrent sinusitis with was treated in Aug and Sept initially treated wiith amoxicillen, followed by depromedrol injection and levoquin. 6 weeks prior, he had a case on enteritis, treated with flagyl for 7 days. His PCP is at [facility]. Flu shot this year not documented.  Notes his wet cough has greens sputum and occasional small traces of blood with nasal congestion and wet cough. Reports associated sxs of wheezing, subjective fever the first 2 days (has subsided since), sleep disturbance, occasional chest tightness, and SOB. He has been using  Qvar for his sxs, and rarely uses his albuterol when he has chest tightness. He has not used his inhaler today. Denies chest pain.  Patient Active Problem List   Diagnosis Date Noted  . Elevated BP 07/27/2015  . Meralgia paresthetica of right side 07/13/2015  . Reactive airway disease with wheezing 07/13/2015  . Obesity (BMI 30-39.9) 09/19/2012  . GERD 09/09/2009  . ABDOMINAL PAIN 09/09/2009   Past Medical History:  Diagnosis Date  . Allergy   . GERD (gastroesophageal reflux disease)    Past Surgical History:  Procedure Laterality Date  . COLON SURGERY    . PARTIAL COLECTOMY     sigmoid, Dr. Earlean Shawl   No Known Allergies Prior  to Admission medications   Medication Sig Start Date End Date Taking? Authorizing Provider  fluticasone (FLONASE) 50 MCG/ACT nasal spray Place 2 sprays into both nostrils daily. 10/03/15  Yes Brunetta Jeans, PA-C  lansoprazole (PREVACID) 15 MG capsule Take 15 mg by mouth daily at 12 noon.   Yes Historical Provider, MD  QVAR 80 MCG/ACT inhaler INHALE 1 PUFF INTO THE LUNGS 2 (TWO) TIMES DAILY. 12/16/15  Yes Midge Minium, MD  benzonatate (TESSALON) 100 MG capsule Take 1 capsule (100 mg total) by mouth 2 (two) times daily as needed for cough. Patient not taking: Reported on 01/29/2016 10/03/15   Brunetta Jeans, PA-C  diphenoxylate-atropine (LOMOTIL) 2.5-0.025 MG tablet Take 2 tablets by mouth 4 (four) times daily as needed for diarrhea or loose stools. Patient not taking: Reported on 01/29/2016 12/10/15   Laurey Morale, MD  DUEXIS 800-26.6 MG TABS Take 1 (one) Tablet two times daily 09/03/15   Historical Provider, MD  hyoscyamine (LEVBID) 0.375 MG 12 hr tablet Take 0.375 mg by mouth at bedtime. 08/25/15   Historical Provider, MD  levofloxacin (LEVAQUIN) 500 MG tablet Take 1 tablet (500 mg total) by mouth daily. Patient not taking: Reported on 01/29/2016 10/03/15   Brunetta Jeans, PA-C  meloxicam (MOBIC) 7.5 MG tablet TAKE 1 TABLET (7.5 MG TOTAL) BY MOUTH DAILY. Patient not taking: Reported on 01/29/2016    Midge Minium, MD  metroNIDAZOLE (FLAGYL) 500  MG tablet Take 1 tablet (500 mg total) by mouth 3 (three) times daily. Patient not taking: Reported on 01/29/2016 12/10/15   Laurey Morale, MD  ondansetron (ZOFRAN) 8 MG tablet Take 1 tablet (8 mg total) by mouth every 6 (six) hours as needed for nausea or vomiting. Patient not taking: Reported on 01/29/2016 12/10/15   Laurey Morale, MD  PROAIR HFA 108 [phone] Base) MCG/ACT inhaler INHALE 2 PUFFS EVERY 6 HOURS AS NEEDED FOR WHEEZING OR SHORTNESS OF BREATH. Patient not taking: Reported on 01/29/2016 06/17/15   Rosalita Chessman Chase, DO  ranitidine  (ZANTAC) 300 MG tablet Take 1 tablet (300 mg total) by mouth at bedtime. Every other evening Patient not taking: Reported on 01/29/2016 04/24/15   [name], MD   Social History   Social History  . Marital status: Married    Spouse name: N/A  . Number of children: N/A  . Years of education: N/A   Occupational History  . Not on file.   Social History Main Topics  . Smoking status: Never Smoker  . Smokeless tobacco: Never Used  . Alcohol use No  . Drug use: No  . Sexual activity: Not on file   Other Topics Concern  . Not on file   Social History Narrative  . No narrative on file   Review of Systems  Constitutional: Negative for fever.  HENT: Positive for congestion.   Respiratory: Positive for cough, chest tightness, shortness of breath and stridor.   Cardiovascular: Negative for chest pain.   Objective:  Physical Exam  Constitutional: He appears well-developed and well-nourished. No distress.  HENT:  Head: Normocephalic and atraumatic.  Right Ear: Tympanic membrane normal.  Left Ear: Tympanic membrane is injected and retracted.  Nose: Nose normal.  Mouth/Throat: Posterior oropharyngeal erythema present.  Eyes: Conjunctivae are normal.  Neck: Neck supple. No thyroid mass and no thyromegaly present.  Cardiovascular: Normal rate.   Pulmonary/Chest: Effort normal. He has decreased breath sounds (Dec air movement throughout). He has wheezes (Faint expiratory wheezing). He has no rhonchi. He has no rales.  Lymphadenopathy:       Head (right side): Tonsillar adenopathy present.       Head (left side): Tonsillar adenopathy present.  Neurological: He is alert.  Skin: Skin is warm and dry.  Psychiatric: He has a normal mood and affect. His behavior is normal.  Nursing note and vitals reviewed.  BP 138/64 (BP Location: Right Arm, Patient Position: Sitting, Cuff Size: Large)   Pulse 91   Temp 98.3 F (36.8 C) (Oral)   Resp 16   Ht 5' 10.5" (1.791 m)   Wt 252 lb  (114.3 kg)   SpO2 96%   BMI 35.65 kg/m  Assessment & Plan:   1. Acute recurrent sinusitis, unspecified location   2. Mild persistent reactive airway disease with acute exacerbation    Meds ordered this encounter  Medications  . methylPREDNISolone acetate (DEPO-MEDROL) injection 80 mg  . albuterol (PROVENTIL) (2.5 MG/3ML) 0.083% nebulizer solution 2.5 mg  . clarithromycin (BIAXIN XL) 500 MG 24 hr tablet    Sig: Take 2 tablets (1,000 mg total) by mouth daily.    Dispense:  14 tablet    Refill:  0  . chlorpheniramine-HYDROcodone (TUSSIONEX PENNKINETIC ER) 10-8 MG/5ML SUER    Sig: Take 5 mLs by mouth at bedtime as needed.    Dispense:  90 mL    Refill:  0    I personally performed the services described in  this documentation, which was scribed in my presence. The recorded information has been reviewed and considered, and addended by me as needed.   Delman Cheadle, M.D.  [facility] [address] [place], [place] [address] [phone] phone [phone] fax  02/01/16 4:23 PM

## 2016-01-29 NOTE — Patient Instructions (Addendum)
IF you received an x-ray today, you will receive an invoice from [facility]. Please contact [facility] at [phone] with questions or concerns regarding your invoice.   IF you received labwork today, you will receive an invoice from [facility]. Please contact [facility] at [phone] with questions or concerns regarding your invoice.   Our billing staff will not be able to assist you with questions regarding bills from these companies.  You will be contacted with the lab results as soon as they are available. The fastest way to get your results is to activate your My Chart account. Instructions are located on the last page of this paperwork. If you have not heard from [name] regarding the results in 2 weeks, please contact this office.     Sinusitis, Adult Sinusitis is soreness and inflammation of your sinuses. Sinuses are hollow spaces in the bones around your face. Your sinuses are located:  Around your eyes.  In the middle of your forehead.  Behind your nose.  In your cheekbones. Your sinuses and nasal passages are lined with a stringy fluid (mucus). Mucus normally drains out of your sinuses. When your nasal tissues become inflamed or swollen, the mucus can become trapped or blocked so air cannot flow through your sinuses. This allows bacteria, viruses, and funguses to grow, which leads to infection. Sinusitis can develop quickly and last for 7?10 days (acute) or for more than 12 weeks (chronic). Sinusitis often develops after a cold. What are the causes? This condition is caused by anything that creates swelling in the sinuses or stops mucus from draining, including:  Allergies.  Asthma.  Bacterial or viral infection.  Abnormally shaped bones between the nasal passages.  Nasal growths that contain mucus (nasal polyps).  Narrow sinus openings.  Pollutants, such as chemicals or irritants in the air.  A foreign object stuck in the nose.  A fungal  infection. This is rare. What increases the risk? The following factors may make you more likely to develop this condition:  Having allergies or asthma.  Having had a recent cold or respiratory tract infection.  Having structural deformities or blockages in your nose or sinuses.  Having a weak immune system.  Doing a lot of swimming or diving.  Overusing nasal sprays.  Smoking. What are the signs or symptoms? The main symptoms of this condition are pain and a feeling of pressure around the affected sinuses. Other symptoms include:  Upper toothache.  Earache.  Headache.  Bad breath.  Decreased sense of smell and taste.  A cough that may get worse at night.  Fatigue.  Fever.  Thick drainage from your nose. The drainage is often green and it may contain pus (purulent).  Stuffy nose or congestion.  Postnasal drip. This is when extra mucus collects in the throat or back of the nose.  Swelling and warmth over the affected sinuses.  Sore throat.  Sensitivity to light. How is this diagnosed? This condition is diagnosed based on symptoms, a medical history, and a physical exam. To find out if your condition is acute or chronic, your health care provider may:  Look in your nose for signs of nasal polyps.  Tap over the affected sinus to check for signs of infection.  View the inside of your sinuses using an imaging device that has a light attached (endoscope). If your health care provider suspects that you have chronic sinusitis, you may also:  Be tested for allergies.  Have a sample of mucus  taken from your nose (nasal culture) and checked for bacteria.  Have a mucus sample examined to see if your sinusitis is related to an allergy. If your sinusitis does not respond to treatment and it lasts longer than 8 weeks, you may have an MRI or CT scan to check your sinuses. These scans also help to determine how severe your infection is. In rare cases, a bone biopsy may  be done to rule out more serious types of fungal sinus disease. How is this treated? Treatment for sinusitis depends on the cause and whether your condition is chronic or acute. If a virus is causing your sinusitis, your symptoms will go away on their own within 10 days. You may be given medicines to relieve your symptoms, including:  Topical nasal decongestants. They shrink swollen nasal passages and let mucus drain from your sinuses.  Antihistamines. These drugs block inflammation that is triggered by allergies. This can help to ease swelling in your nose and sinuses.  Topical nasal corticosteroids. These are nasal sprays that ease inflammation and swelling in your nose and sinuses.  Nasal saline washes. These rinses can help to get rid of thick mucus in your nose. If your condition is caused by bacteria, you will be given an antibiotic medicine. If your condition is caused by a fungus, you will be given an antifungal medicine. Surgery may be needed to correct underlying conditions, such as narrow nasal passages. Surgery may also be needed to remove polyps. Follow these instructions at home: Medicines  Take, use, or apply over-the-counter and prescription medicines only as told by your health care provider. These may include nasal sprays.  If you were prescribed an antibiotic medicine, take it as told by your health care provider. Do not stop taking the antibiotic even if you start to feel better. Hydrate and Humidify  Drink enough water to keep your urine clear or pale yellow. Staying hydrated will help to thin your mucus.  Use a cool mist humidifier to keep the humidity level in your home above 50%.  Inhale steam for 10-15 minutes, 3-4 times a day or as told by your health care provider. You can do this in the bathroom while a hot shower is running.  Limit your exposure to cool or dry air. Rest  Rest as much as possible.  Sleep with your head raised (elevated).  Make sure to get  enough sleep each night. General instructions  Apply a warm, moist washcloth to your face 3-4 times a day or as told by your health care provider. This will help with discomfort.  Wash your hands often with soap and water to reduce your exposure to viruses and other germs. If soap and water are not available, use hand sanitizer.  Do not smoke. Avoid being around people who are smoking (secondhand smoke).  Keep all follow-up visits as told by your health care provider. This is important. Contact a health care provider if:  You have a fever.  Your symptoms get worse.  Your symptoms do not improve within 10 days. Get help right away if:  You have a severe headache.  You have persistent vomiting.  You have pain or swelling around your face or eyes.  You have vision problems.  You develop confusion.  Your neck is stiff.  You have trouble breathing. This information is not intended to replace advice given to you by your health care provider. Make sure you discuss any questions you have with your health care provider. Document  Released: 01/17/2005 Document Revised: 09/13/2015 Document Reviewed: 11/12/2014 Elsevier Interactive Patient Education  2017 [place] Prevention, Adult Although you may not be able to control the fact that you have asthma, you can take actions to prevent episodes of asthma (asthma attacks). These actions include:  Creating a written plan for managing and treating your asthma attacks (asthma action plan).  Monitoring your asthma.  Avoiding things that can irritate your airways or make your asthma symptoms worse (asthma triggers).  Taking your medicines as directed.  Acting quickly if you have signs or symptoms of an asthma attack. What are some ways to prevent an asthma attack? Create a plan Work with your health care provider to create an asthma action plan. This plan should include:  A list of your asthma triggers and how to  avoid them.  A list of symptoms that you experience during an asthma attack.  Information about when to take medicine and how much medicine to take.  Information to help you understand your peak flow measurements.  Contact information for your health care providers.  Daily actions that you can take to control asthma. Monitor your asthma   To monitor your asthma:  Use your peak flow meter every morning and every evening for 2-3 weeks. Record the results in a journal. A drop in your peak flow numbers on one or more days may mean that you are starting to have an asthma attack, even if you are not having symptoms.  When you have asthma symptoms, write them down in a journal. Avoid asthma triggers   Work with your health care provider to find out what your asthma triggers are. This can be done by:  Being tested for allergies.  Keeping a journal that notes when asthma attacks occur and what may have contributed to them.  Asking your health care provider whether other medical conditions make your asthma worse. Common asthma triggers include:  Dust.  Smoke. This includes campfire smoke and secondhand smoke from tobacco products.  Pet dander.  Trees, grasses or pollens.  Very cold, dry, or humid air.  Mold.  Foods that contain high amounts of sulfites.  Strong smells.  Engine exhaust and air pollution.  Aerosol sprays and fumes from household cleaners.  Household pests and their droppings, including dust mites and cockroaches.  Certain medicines, including NSAIDs. Once you have determined your asthma triggers, take steps to avoid them. Depending on your triggers, you may be able to reduce the chance of an asthma attack by:  Keeping your home clean. Have someone dust and vacuum your home for you 1 or 2 times a week. If possible, have them use a high-efficiency particulate arrestance (HEPA) vacuum.  Washing your sheets weekly in hot water.  Using allergy-proof mattress  covers and casings on your bed.  Keeping pets out of your home.  Taking care of mold and water problems in your home.  Avoiding areas where people smoke.  Avoiding using strong perfumes or odor sprays.  Avoid spending a lot of time outdoors when pollen counts are high and on very windy days.  Talking with your health care provider before stopping or starting any new medicines. Medicines Take over-the-counter and prescription medicines only as told by your health care provider. Many asthma attacks can be prevented by carefully following your medicine schedule. Taking your medicines correctly is especially important when you cannot avoid certain asthma triggers. Even if you are doing well, do not stop taking your medicine  and do not take less medicine. Act quickly If an asthma attack happens, acting quickly can decrease how severe it is and how long it lasts. Take these actions:  Pay attention to your symptoms. If you are coughing, wheezing, or having difficulty breathing, do not wait to see if your symptoms go away on their own. Follow your asthma action plan.  If you have followed your asthma action plan and your symptoms are not improving, call your health care provider or seek immediate medical care at the nearest hospital. It is important to write down how often you need to use your fast-acting rescue inhaler. You can track how often you use an inhaler in your journal. If you are using your rescue inhaler more often, it may mean that your asthma is not under control. Adjusting your asthma treatment plan may help you to prevent future asthma attacks and help you to gain better control of your condition. How can I prevent an asthma attack when I exercise?   Exercise is a common asthma trigger. To prevent asthma attacks during exercise:  Follow advice from your health care provider about whether you should use your fast-acting inhaler before exercising. Many people with asthma experience  exercise-induced bronchoconstriction (EIB). This condition often worsens during vigorous exercise in cold, humid, or dry environments. Usually, people with EIB can stay very active by using a fast-acting inhaler before exercising.  Avoid exercising outdoors in very cold or humid weather.  Avoid exercising outdoors when pollen counts are high.  Warm up and cool down when exercising.  Stop exercising right away if asthma symptoms start. Consider taking part in exercises that are less likely to cause asthma symptoms such as:  Indoor swimming.  Biking.  Walking.  Hiking.  Playing football. This information is not intended to replace advice given to you by your health care provider. Make sure you discuss any questions you have with your health care provider. Document Released: 01/05/2009 Document Revised: 09/18/2015 Document Reviewed: 07/04/2015 Elsevier Interactive Patient Education  2017 [employer].

## 2016-02-19 ENCOUNTER — Ambulatory Visit: Payer: BLUE CROSS/BLUE SHIELD

## 2016-02-20 ENCOUNTER — Ambulatory Visit (INDEPENDENT_AMBULATORY_CARE_PROVIDER_SITE_OTHER): Payer: BLUE CROSS/BLUE SHIELD | Admitting: Family Medicine

## 2016-02-20 VITALS — BP 120/70 | HR 85 | Temp 98.6°F | Resp 18 | Ht 70.5 in | Wt 248.1 lb

## 2016-02-20 DIAGNOSIS — J4531 Mild persistent asthma with (acute) exacerbation: Secondary | ICD-10-CM | POA: Diagnosis not present

## 2016-02-20 DIAGNOSIS — J111 Influenza due to unidentified influenza virus with other respiratory manifestations: Secondary | ICD-10-CM | POA: Diagnosis not present

## 2016-02-20 DIAGNOSIS — J329 Chronic sinusitis, unspecified: Secondary | ICD-10-CM

## 2016-02-20 MED ORDER — HYDROCOD POLST-CPM POLST ER 10-8 MG/5ML PO SUER: 5.0000 mL | Freq: Every evening | ORAL | 0 refills | Status: DC | PRN

## 2016-02-20 MED ORDER — ALBUTEROL SULFATE (2.5 MG/3ML) 0.083% IN NEBU
2.5000 mg | INHALATION_SOLUTION | Freq: Once | RESPIRATORY_TRACT | Status: AC
  Administered 2016-02-20: 2.5 mg via RESPIRATORY_TRACT

## 2016-02-20 MED ORDER — IPRATROPIUM BROMIDE 0.02 % IN SOLN
0.5000 mg | Freq: Once | RESPIRATORY_TRACT | Status: AC
  Administered 2016-02-20: 0.5 mg via RESPIRATORY_TRACT

## 2016-02-20 NOTE — Progress Notes (Signed)
     IF you received an x-ray today, you will receive an invoice from [facility]. Please contact [facility] at [phone] with questions or concerns regarding your invoice.   IF you received labwork today, you will receive an invoice from [facility]. Please contact [facility] at [phone] with questions or concerns regarding your invoice.   Our billing staff will not be able to assist you with questions regarding bills from these companies.  You will be contacted with the lab results as soon as they are available. The fastest way to get your results is to activate your My Chart account. Instructions are located on the last page of this paperwork. If you have not heard from us regarding the results in 2 weeks, please contact this office.     

## 2016-02-20 NOTE — Patient Instructions (Addendum)
Make sure you are using your albuterol inhaler every 4 hours and especially before bed. Make sure you are continuing on your Qvar 2 puffs twice a day and the flonase every night.   This will likely take another 3 weeks for your cough to clear completely but if at any point you are getting worse come back or if you are still having any symptoms by Valentine's Day, come back.  IF you received an x-ray today, you will receive an invoice from [facility]. Please contact [facility] at [phone] with questions or concerns regarding your invoice.   IF you received labwork today, you will receive an invoice from [facility]. Please contact [facility] at [phone] with questions or concerns regarding your invoice.   Our billing staff will not be able to assist you with questions regarding bills from these companies.  You will be contacted with the lab results as soon as they are available. The fastest way to get your results is to activate your My Chart account. Instructions are located on the last page of this paperwork. If you have not heard from [name] regarding the results in 2 weeks, please contact this office.      Influenza, Adult Influenza, more commonly known as "the flu," is a viral infection that primarily affects the respiratory tract. The respiratory tract includes organs that help you breathe, such as the lungs, nose, and throat. The flu causes many common cold symptoms, as well as a high fever and body aches. The flu spreads easily from person to person (is contagious). Getting a flu shot (influenza vaccination) every year is the best way to prevent influenza. What are the causes? Influenza is caused by a virus. You can catch the virus by:  Breathing in droplets from an infected person's cough or sneeze.  Touching something that was recently contaminated with the virus and then touching your mouth, nose, or eyes. What increases the risk? The following factors may make  you more likely to get the flu:  Not cleaning your hands frequently with soap and water or alcohol-based hand sanitizer.  Having close contact with many people during cold and flu season.  Touching your mouth, eyes, or nose without washing or sanitizing your hands first.  Not drinking enough fluids or not eating a healthy diet.  Not getting enough sleep or exercise.  Being under a high amount of stress.  Not getting a yearly (annual) flu shot. You may be at a higher risk of complications from the flu, such as a severe lung infection (pneumonia), if you:  Are over the age of 47.  Are pregnant.  Have a weakened disease-fighting system (immune system). You may have a weakened immune system if you:  Have HIV or AIDS.  Are undergoing chemotherapy.  Aretaking medicines that reduce the activity of (suppress) the immune system.  Have a long-term (chronic) illness, such as heart disease, kidney disease, diabetes, or lung disease.  Have a liver disorder.  Are obese.  Have anemia. What are the signs or symptoms? Symptoms of this condition typically last 4-10 days and may include:  Fever.  Chills.  Headache, body aches, or muscle aches.  Sore throat.  Cough.  Runny or congested nose.  Chest discomfort and cough.  Poor appetite.  Weakness or tiredness (fatigue).  Dizziness.  Nausea or vomiting. How is this diagnosed? This condition may be diagnosed based on your medical history and a physical exam. Your health care provider may do a nose or throat swab  test to confirm the diagnosis. How is this treated? If influenza is detected early, you can be treated with antiviral medicine that can reduce the length of your illness and the severity of your symptoms. This medicine may be given by mouth (orally) or through an IV tube that is inserted in one of your veins. The goal of treatment is to relieve symptoms by taking care of yourself at home. This may include taking  over-the-counter medicines, drinking plenty of fluids, and adding humidity to the air in your home. In some cases, influenza goes away on its own. Severe influenza or complications from influenza may be treated in a hospital. Follow these instructions at home:  Take over-the-counter and prescription medicines only as told by your health care provider.  Use a cool mist humidifier to add humidity to the air in your home. This can make breathing easier.  Rest as needed.  Drink enough fluid to keep your urine clear or pale yellow.  Cover your mouth and nose when you cough or sneeze.  Wash your hands with soap and water often, especially after you cough or sneeze. If soap and water are not available, use hand sanitizer.  Stay home from work or school as told by your health care provider. Unless you are visiting your health care provider, try to avoid leaving home until your fever has been gone for 24 hours without the use of medicine.  Keep all follow-up visits as told by your health care provider. This is important. How is this prevented?  Getting an annual flu shot is the best way to avoid getting the flu. You may get the flu shot in late summer, fall, or winter. Ask your health care provider when you should get your flu shot.  Wash your hands often or use hand sanitizer often.  Avoid contact with people who are sick during cold and flu season.  Eat a healthy diet, drink plenty of fluids, get enough sleep, and exercise regularly. Contact a health care provider if:  You develop new symptoms.  You have:  Chest pain.  Diarrhea.  A fever.  Your cough gets worse.  You produce more mucus.  You feel nauseous or you vomit. Get help right away if:  You develop shortness of breath or difficulty breathing.  Your skin or nails turn a bluish color.  You have severe pain or stiffness in your neck.  You develop a sudden headache or sudden pain in your face or ear.  You cannot  stop vomiting. This information is not intended to replace advice given to you by your health care provider. Make sure you discuss any questions you have with your health care provider. Document Released: 01/15/2000 Document Revised: 06/25/2015 Document Reviewed: 11/11/2014 Elsevier Interactive Patient Education  2017 [employer].

## 2016-02-20 NOTE — Progress Notes (Signed)
Subjective:  By signing my name below, I, [name], attest that this documentation has been prepared under the direction and in the presence of Delman Cheadle, MD. Electronically Signed: [name], [place]. 02/20/2016 , 9:34 AM .  Patient was seen in Room 8 .   Patient ID: Thomas [name], male    DOB: 12/07/1960, 56 y.o.   MRN: [id number] Chief Complaint  Patient presents with  . flu like symptoms    C/O body aches, SOB, headache, chest congestion, & fever (resolved now) since Wednesday. Wife tested positive for flu on Tuesday   HPI Thomas [name] is a 56 y.o. male who presents to [facility] complaining of possible flu with myalgia, shortness of breath, headache, congestion and fever that started 3 days ago. He has a history of asthma on qvar bid, flonase qd, and albuterol, as well as history of recurrent sinusitis.   Patient reports feeling phlegm being trapped and moving down into his chest when he's laying down. His sinus has gotten better from previous visit, but the congestion never really resolved. He's had chills, body aches and fever for past 3 days. He can hear rattling of the congestion in his chest. He's placed a cup next to his bed to spit the phlegm into, appearing clear to yellowish tint (no blood). He did try some packs of theraflu. His daughter called, stating that she's had the flu, and so does his grandchild (63 years old). His wife was also tested positive for flu 4 days ago. He denies receiving flu shot this year. He had cough syrup in the past for relief at night.   Past Medical History:  Diagnosis Date  . Allergy   . GERD (gastroesophageal reflux disease)    Prior to Admission medications   Medication Sig Start Date End Date Taking? Authorizing Provider  fluticasone (FLONASE) 50 MCG/ACT nasal spray Place 2 sprays into both nostrils daily. 10/03/15  Yes Brunetta Jeans, PA-C  hyoscyamine (LEVBID) 0.375 MG 12 hr tablet Take 0.375 mg by mouth at bedtime. 08/25/15  Yes  Historical Provider, MD  lansoprazole (PREVACID) 15 MG capsule Take 15 mg by mouth daily at 12 noon.   Yes Historical Provider, MD  PROAIR HFA 108 (90 Base) MCG/ACT inhaler INHALE 2 PUFFS EVERY 6 HOURS AS NEEDED FOR WHEEZING OR SHORTNESS OF BREATH. 06/17/15  Yes Yvonne R Lowne Chase, DO  QVAR 80 MCG/ACT inhaler INHALE 1 PUFF INTO THE LUNGS 2 (TWO) TIMES DAILY. 12/16/15  Yes Midge Minium, MD  DUEXIS 800-26.6 MG TABS Take 1 (one) Tablet two times daily 09/03/15   Historical Provider, MD  meloxicam (MOBIC) 7.5 MG tablet TAKE 1 TABLET (7.5 MG TOTAL) BY MOUTH DAILY. Patient not taking: Reported on 01/29/2016    Midge Minium, MD   No Known Allergies  Review of Systems  Constitutional: Positive for chills, fatigue and fever. Negative for diaphoresis.  HENT: Positive for congestion. Negative for sinus pain and sinus pressure.   Respiratory: Positive for cough and shortness of breath. Negative for chest tightness and wheezing.   Gastrointestinal: Negative for nausea and vomiting.  Musculoskeletal: Positive for myalgias.  Neurological: Positive for headaches.       Objective:   Physical Exam  Constitutional: He is oriented to person, place, and time. He appears well-developed and well-nourished. No distress.  HENT:  Head: Normocephalic and atraumatic.  Right Ear: Tympanic membrane is retracted. A middle ear effusion is present.  Left Ear: Tympanic membrane normal.  Nose: Mucosal  edema (R>L) present.  Nares are pale Oropharynx soft palate with petechiae  Eyes: EOM are normal. Pupils are equal, round, and reactive to light.  Neck: Neck supple. No thyromegaly present.  Cardiovascular: Normal rate.   Pulmonary/Chest: Effort normal. No respiratory distress. He has decreased breath sounds in the right lower field and the left lower field.  Musculoskeletal: Normal range of motion.  Lymphadenopathy:       Head (right side): Submandibular adenopathy present.       Head (left side):  Submandibular adenopathy present.    He has cervical adenopathy (anterior).  Neurological: He is alert and oriented to person, place, and time.  Skin: Skin is warm and dry.  Psychiatric: He has a normal mood and affect. His behavior is normal.  Nursing note and vitals reviewed.   BP 120/70   Pulse 85   Temp 98.6 F (37 C) (Oral)   Resp 18   Ht 5' 10.5" (1.791 m)   Wt 248 lb 2 oz (112.5 kg)   SpO2 98%   BMI 35.10 kg/m     Assessment & Plan:   1. Influenza with respiratory manifestation   2. Mild persistent reactive airway disease with acute exacerbation   3. Chronic recurrent sinusitis     Meds ordered this encounter  Medications  . albuterol (PROVENTIL) (2.5 MG/3ML) 0.083% nebulizer solution 2.5 mg  . ipratropium (ATROVENT) nebulizer solution 0.5 mg  . chlorpheniramine-HYDROcodone (TUSSIONEX PENNKINETIC ER) 10-8 MG/5ML SUER    Sig: Take 5 mLs by mouth at bedtime as needed.    Dispense:  120 mL    Refill:  0    I personally performed the services described in this documentation, which was scribed in my presence. The recorded information has been reviewed and considered, and addended by me as needed.   Delman Cheadle, M.D.  [facility] [address] [place], [place] [address] [phone] phone [phone] fax  03/01/16 10:10 PM

## 2016-02-26 ENCOUNTER — Other Ambulatory Visit: Payer: Self-pay | Admitting: Physician Assistant

## 2016-02-26 DIAGNOSIS — J329 Chronic sinusitis, unspecified: Secondary | ICD-10-CM

## 2016-03-17 ENCOUNTER — Encounter: Payer: Self-pay | Admitting: Family Medicine

## 2016-03-17 DIAGNOSIS — M5412 Radiculopathy, cervical region: Secondary | ICD-10-CM

## 2016-03-20 ENCOUNTER — Other Ambulatory Visit: Payer: Self-pay | Admitting: Family Medicine

## 2016-03-21 ENCOUNTER — Other Ambulatory Visit: Payer: Self-pay | Admitting: General Practice

## 2016-03-21 MED ORDER — BECLOMETHASONE DIPROP HFA 80 MCG/ACT IN AERB: 1.0000 | INHALATION_SPRAY | Freq: Two times a day (BID) | RESPIRATORY_TRACT | 0 refills | Status: DC

## 2016-04-17 ENCOUNTER — Other Ambulatory Visit: Payer: Self-pay | Admitting: Family Medicine

## 2016-04-17 DIAGNOSIS — K219 Gastro-esophageal reflux disease without esophagitis: Secondary | ICD-10-CM

## 2016-04-22 ENCOUNTER — Encounter: Payer: Self-pay | Admitting: Family Medicine

## 2016-04-22 ENCOUNTER — Ambulatory Visit (INDEPENDENT_AMBULATORY_CARE_PROVIDER_SITE_OTHER): Payer: BLUE CROSS/BLUE SHIELD | Admitting: Family Medicine

## 2016-04-22 VITALS — BP 138/78 | HR 98 | Temp 98.1°F | Wt 252.2 lb

## 2016-04-22 DIAGNOSIS — R062 Wheezing: Secondary | ICD-10-CM

## 2016-04-22 DIAGNOSIS — J209 Acute bronchitis, unspecified: Secondary | ICD-10-CM

## 2016-04-22 DIAGNOSIS — J4521 Mild intermittent asthma with (acute) exacerbation: Secondary | ICD-10-CM

## 2016-04-22 MED ORDER — IPRATROPIUM BROMIDE 0.02 % IN SOLN
0.5000 mg | Freq: Once | RESPIRATORY_TRACT | Status: AC
  Administered 2016-04-22: 0.5 mg via RESPIRATORY_TRACT

## 2016-04-22 MED ORDER — HYDROCOD POLST-CPM POLST ER 10-8 MG/5ML PO SUER: 5.0000 mL | Freq: Two times a day (BID) | ORAL | 0 refills | Status: DC | PRN

## 2016-04-22 MED ORDER — AZITHROMYCIN 250 MG PO TABS: ORAL_TABLET | ORAL | 0 refills | Status: DC

## 2016-04-22 MED ORDER — METHYLPREDNISOLONE ACETATE 80 MG/ML IJ SUSP
160.0000 mg | Freq: Once | INTRAMUSCULAR | Status: AC
  Administered 2016-04-22: 160 mg via INTRAMUSCULAR

## 2016-04-22 NOTE — Progress Notes (Signed)
   Subjective:    Patient ID: Thomas [name], male    DOB: 01-21-1961, 56 y.o.   MRN: [id number]  HPI Here for 4 days of chest tightness, wheezing, and coughing up green sputum. No fever or chest pain. Using Qvar and albuterol regularly.    Review of Systems  Constitutional: Negative.   HENT: Negative.   Eyes: Negative.   Respiratory: Positive for cough, chest tightness, shortness of breath and wheezing.   Cardiovascular: Negative.        Objective:   Physical Exam  Constitutional: He appears well-developed and well-nourished.  HENT:  Right Ear: External ear normal.  Left Ear: External ear normal.  Nose: Nose normal.  Mouth/Throat: Oropharynx is clear and moist.  Eyes: Conjunctivae are normal.  Neck: No thyromegaly present.  Cardiovascular: Normal rate, regular rhythm, normal heart sounds and intact distal pulses.   Pulmonary/Chest: Effort normal. He has no rales.  Scattered wheezes and rhonchi   Lymphadenopathy:    He has no cervical adenopathy.          Assessment & Plan:  Bronchitis with asthma exacerbation. He was treated with a Duoneb nebulization and a shot of DepoMedrol. Given a Zpack. Recheck prn.  Alysia Penna, MD

## 2016-04-22 NOTE — Patient Instructions (Signed)
WE NOW OFFER   [facility] FAST TRACK!!!  SAME DAY Appointments for ACUTE CARE  Such as: Sprains, Injuries, cuts, abrasions, rashes, muscle pain, joint pain, back pain Colds, flu, sore throats, headache, allergies, cough, fever  Ear pain, sinus and eye infections Abdominal pain, nausea, vomiting, diarrhea, upset stomach Animal/insect bites  3 Easy Ways to Schedule: Walk-In Scheduling Call in scheduling Mychart Sign-up: [email or web address]         

## 2016-04-22 NOTE — Progress Notes (Signed)
Pre visit review using our clinic review tool, if applicable. No additional management support is needed unless otherwise documented below in the visit note. 

## 2016-05-17 ENCOUNTER — Other Ambulatory Visit: Payer: Self-pay | Admitting: Neurosurgery

## 2016-05-17 DIAGNOSIS — M5412 Radiculopathy, cervical region: Secondary | ICD-10-CM

## 2016-05-25 ENCOUNTER — Other Ambulatory Visit: Payer: Self-pay | Admitting: Physician Assistant

## 2016-05-25 DIAGNOSIS — J329 Chronic sinusitis, unspecified: Secondary | ICD-10-CM

## 2016-05-30 ENCOUNTER — Ambulatory Visit
Admission: RE | Admit: 2016-05-30 | Discharge: 2016-05-30 | Disposition: A | Discharge status: Home / Self Care | Payer: BLUE CROSS/BLUE SHIELD | Source: Ambulatory Visit | Attending: Neurosurgery | Admitting: Neurosurgery

## 2016-05-30 DIAGNOSIS — M5412 Radiculopathy, cervical region: Secondary | ICD-10-CM

## 2016-06-07 ENCOUNTER — Other Ambulatory Visit: Payer: Self-pay | Admitting: Neurosurgery

## 2016-07-11 ENCOUNTER — Encounter (HOSPITAL_COMMUNITY)
Admission: RE | Admit: 2016-07-11 | Discharge: 2016-07-11 | Disposition: A | Discharge status: Home / Self Care | Payer: BLUE CROSS/BLUE SHIELD | Source: Ambulatory Visit | Attending: Neurosurgery | Admitting: Neurosurgery

## 2016-07-11 ENCOUNTER — Encounter (HOSPITAL_COMMUNITY): Payer: Self-pay

## 2016-07-11 DIAGNOSIS — M4802 Spinal stenosis, cervical region: Secondary | ICD-10-CM | POA: Diagnosis not present

## 2016-07-11 DIAGNOSIS — Z79899 Other long term (current) drug therapy: Secondary | ICD-10-CM | POA: Diagnosis not present

## 2016-07-11 DIAGNOSIS — J45909 Unspecified asthma, uncomplicated: Secondary | ICD-10-CM | POA: Diagnosis not present

## 2016-07-11 DIAGNOSIS — Z01812 Encounter for preprocedural laboratory examination: Secondary | ICD-10-CM | POA: Insufficient documentation

## 2016-07-11 DIAGNOSIS — K219 Gastro-esophageal reflux disease without esophagitis: Secondary | ICD-10-CM | POA: Diagnosis not present

## 2016-07-11 DIAGNOSIS — Z7951 Long term (current) use of inhaled steroids: Secondary | ICD-10-CM | POA: Diagnosis not present

## 2016-07-11 DIAGNOSIS — M4722 Other spondylosis with radiculopathy, cervical region: Secondary | ICD-10-CM | POA: Diagnosis not present

## 2016-07-11 DIAGNOSIS — M50122 Cervical disc disorder at C5-C6 level with radiculopathy: Secondary | ICD-10-CM | POA: Diagnosis not present

## 2016-07-11 HISTORY — DX: Pneumonia, unspecified organism: J18.9

## 2016-07-11 HISTORY — DX: Personal history of other diseases of the digestive system: Z87.19

## 2016-07-11 HISTORY — DX: Unspecified asthma, uncomplicated: J45.909

## 2016-07-11 LAB — BASIC METABOLIC PANEL
ANION GAP: 7 (ref 5–15)
BUN: 12 mg/dL (ref 6–20)
CALCIUM: 9.3 mg/dL (ref 8.9–10.3)
CO2: 25 mmol/L (ref 22–32)
CREATININE: 1.16 mg/dL (ref 0.61–1.24)
Chloride: 106 mmol/L (ref 101–111)
GFR calc non Af Amer: >60 mL/min (ref >60)
Glucose, Bld: 130 mg/dL — ABNORMAL HIGH (ref 65–99)
Potassium: 4.1 mmol/L (ref 3.5–5.1)
SODIUM: 138 mmol/L (ref 135–145)

## 2016-07-11 LAB — SURGICAL PCR SCREEN
MRSA, PCR: NEGATIVE
STAPHYLOCOCCUS AUREUS: NEGATIVE

## 2016-07-11 LAB — CBC
HEMATOCRIT: 42.3 % (ref 39.0–52.0)
HEMOGLOBIN: 14.4 g/dL (ref 13.0–17.0)
MCH: 30.6 pg (ref 26.0–34.0)
MCHC: 34 g/dL (ref 30.0–36.0)
MCV: 89.8 fL (ref 78.0–100.0)
Platelets: 291 10*3/uL (ref 150–400)
RBC: 4.71 MIL/uL (ref 4.22–5.81)
RDW: 13 % (ref 11.5–15.5)
WBC: 7.1 10*3/uL (ref 4.0–10.5)

## 2016-07-11 LAB — ABO/RH: ABO/RH(D): O POS

## 2016-07-11 LAB — TYPE AND SCREEN
ABO/RH(D): O POS
ANTIBODY SCREEN: NEGATIVE

## 2016-07-11 NOTE — Progress Notes (Signed)
   07/11/16 1151  OBSTRUCTIVE SLEEP APNEA  Have you ever been diagnosed with sleep apnea through a sleep study? No  Do you snore loudly (loud enough to be heard through closed doors)?  1  Do you often feel tired, fatigued, or sleepy during the daytime (such as falling asleep during driving or talking to someone)? 0  Has anyone observed you stop breathing during your sleep? 0  Do you have, or are you being treated for high blood pressure? 0  BMI more than 35 kg/m2? 1  Age > 50 (1-yes) 1  Neck circumference greater than:Male 16 inches or larger, Male 17inches or larger? 1  Male Gender (Yes=1) 1  Obstructive Sleep Apnea Score 5  Score 5 or greater  Results sent to PCP

## 2016-07-11 NOTE — Pre-Procedure Instructions (Signed)
Thomas [name]  07/11/2016      [id number] [name], [place]  [address] Phone: [phone] Fax: [phone]    Your procedure is scheduled on   Thursday  07/14/16  Report to [facility] Admitting at 530 A.M.  Call this number if you have problems the morning of surgery:  [phone]   Remember:  Do not eat food or drink liquids after midnight.  Take these medicines the morning of surgery with A SIP OF WATER      QVAR INHALER, LANSOPRAZOLE (PREVACID), EYE DROPS IF NEEDED, PROAIR INHALER   7 days prior to surgery STOP taking any Aspirin, Aleve, Naproxen, Ibuprofen, Motrin, Advil, Goody's, BC's, all herbal medications, fish oil, and all vitamins   Do not wear jewelry, make-up or nail polish.  Do not wear lotions, powders, or perfumes, or deoderant.  Do not shave 48 hours prior to surgery.  Men may shave face and neck.  Do not bring valuables to the hospital.  [facility] is not responsible for any belongings or valuables.  Contacts, dentures or bridgework may not be worn into surgery.  Leave your suitcase in the car.  After surgery it may be brought to your room.  For patients admitted to the hospital, discharge time will be determined by your treatment team.  Patients discharged the day of surgery will not be allowed to drive home.   Name and phone number of your driver:    Special instructions:  [facility] - Preparing for Surgery  Before surgery, you can play an important role.  Because skin is not sterile, your skin needs to be as free of germs as possible.  You can reduce the number of germs on you skin by washing with CHG (chlorahexidine gluconate) soap before surgery.  CHG is an antiseptic cleaner which kills germs and bonds with the skin to continue killing germs even after washing.  Please DO NOT use if you have an allergy to CHG or antibacterial soaps.  If your skin becomes  reddened/irritated stop using the CHG and inform your nurse when you arrive at Short Stay.  Do not shave (including legs and underarms) for at least 48 hours prior to the first CHG shower.  You may shave your face.  Please follow these instructions carefully:   1.  Shower with CHG Soap the night before surgery and the                                morning of Surgery.  2.  If you choose to wash your hair, wash your hair first as usual with your       normal shampoo.  3.  After you shampoo, rinse your hair and body thoroughly to remove the                      Shampoo.  4.  Use CHG as you would any other liquid soap.  You can apply chg directly       to the skin and wash gently with scrungie or a clean washcloth.  5.  Apply the CHG Soap to your body ONLY FROM THE NECK DOWN.        Do not use on open wounds or open sores.  Avoid contact with your eyes,       ears, mouth and genitals (  private parts).  Wash genitals (private parts)       with your normal soap.  6.  Wash thoroughly, paying special attention to the area where your surgery        will be performed.  7.  Thoroughly rinse your body with warm water from the neck down.  8.  DO NOT shower/wash with your normal soap after using and rinsing off       the CHG Soap.  9.  Pat yourself dry with a clean towel.            10.  Wear clean pajamas.            11.  Place clean sheets on your bed the night of your first shower and do not        sleep with pets.  Day of Surgery  Do not apply any lotions/deoderants the morning of surgery.  Please wear clean clothes to the hospital/surgery center.    Please read over the following fact sheets that you were given. MRSA Information and Surgical Site Infection Prevention

## 2016-07-13 MED ORDER — CEFAZOLIN SODIUM-DEXTROSE 2-4 GM/100ML-% IV SOLN
2.0000 g | INTRAVENOUS | Status: AC
  Administered 2016-07-14: 2 g via INTRAVENOUS
  Filled 2016-07-13: qty 100

## 2016-07-14 ENCOUNTER — Encounter (HOSPITAL_COMMUNITY)
Admission: AD | Disposition: A | Discharge status: Home / Self Care | Payer: Self-pay | Source: Ambulatory Visit | Attending: Neurosurgery

## 2016-07-14 ENCOUNTER — Observation Stay (HOSPITAL_COMMUNITY)
Admission: AD | Admit: 2016-07-14 | Discharge: 2016-07-15 | DRG: 473 | Disposition: A | Discharge status: Home / Self Care | Payer: BLUE CROSS/BLUE SHIELD | Source: Ambulatory Visit | Attending: Neurosurgery | Admitting: Neurosurgery

## 2016-07-14 ENCOUNTER — Encounter (HOSPITAL_COMMUNITY): Payer: Self-pay | Admitting: *Deleted

## 2016-07-14 ENCOUNTER — Ambulatory Visit (HOSPITAL_COMMUNITY): Payer: BLUE CROSS/BLUE SHIELD

## 2016-07-14 ENCOUNTER — Ambulatory Visit (HOSPITAL_COMMUNITY): Payer: BLUE CROSS/BLUE SHIELD | Admitting: Certified Registered Nurse Anesthetist

## 2016-07-14 DIAGNOSIS — M50122 Cervical disc disorder at C5-C6 level with radiculopathy: Secondary | ICD-10-CM | POA: Diagnosis not present

## 2016-07-14 DIAGNOSIS — Z419 Encounter for procedure for purposes other than remedying health state, unspecified: Secondary | ICD-10-CM

## 2016-07-14 DIAGNOSIS — M4722 Other spondylosis with radiculopathy, cervical region: Secondary | ICD-10-CM | POA: Insufficient documentation

## 2016-07-14 DIAGNOSIS — K219 Gastro-esophageal reflux disease without esophagitis: Secondary | ICD-10-CM | POA: Insufficient documentation

## 2016-07-14 DIAGNOSIS — M5020 Other cervical disc displacement, unspecified cervical region: Secondary | ICD-10-CM | POA: Diagnosis present

## 2016-07-14 DIAGNOSIS — Z79899 Other long term (current) drug therapy: Secondary | ICD-10-CM

## 2016-07-14 DIAGNOSIS — Z7951 Long term (current) use of inhaled steroids: Secondary | ICD-10-CM | POA: Insufficient documentation

## 2016-07-14 DIAGNOSIS — M4802 Spinal stenosis, cervical region: Secondary | ICD-10-CM | POA: Diagnosis present

## 2016-07-14 DIAGNOSIS — J45909 Unspecified asthma, uncomplicated: Secondary | ICD-10-CM | POA: Insufficient documentation

## 2016-07-14 HISTORY — PX: ANTERIOR CERVICAL DECOMP/DISCECTOMY FUSION: SHX1161

## 2016-07-14 SURGERY — ANTERIOR CERVICAL DECOMPRESSION/DISCECTOMY FUSION 2 LEVELS
Anesthesia: General

## 2016-07-14 MED ORDER — THROMBIN 5000 UNITS EX SOLR
CUTANEOUS | Status: AC
  Filled 2016-07-14: qty 5000

## 2016-07-14 MED ORDER — FENTANYL CITRATE (PF) 100 MCG/2ML IJ SOLN
25.0000 ug | INTRAMUSCULAR | Status: DC | PRN
  Administered 2016-07-14 (×2): 25 ug via INTRAVENOUS

## 2016-07-14 MED ORDER — PROPOFOL 10 MG/ML IV BOLUS
INTRAVENOUS | Status: AC
  Filled 2016-07-14: qty 40

## 2016-07-14 MED ORDER — PROPOFOL 10 MG/ML IV BOLUS
INTRAVENOUS | Status: DC | PRN
  Administered 2016-07-14: 150 mg via INTRAVENOUS
  Administered 2016-07-14: 50 mg via INTRAVENOUS
  Administered 2016-07-14: 20 mg via INTRAVENOUS

## 2016-07-14 MED ORDER — 0.9 % SODIUM CHLORIDE (POUR BTL) OPTIME
TOPICAL | Status: DC | PRN
  Administered 2016-07-14: 1000 mL

## 2016-07-14 MED ORDER — ACETAMINOPHEN 325 MG PO TABS: 650.0000 mg | ORAL_TABLET | ORAL | Status: DC | PRN

## 2016-07-14 MED ORDER — PANTOPRAZOLE SODIUM 40 MG IV SOLR: 40.0000 mg | Freq: Every day | INTRAVENOUS | Status: DC

## 2016-07-14 MED ORDER — EPHEDRINE 5 MG/ML INJ
INTRAVENOUS | Status: AC
  Filled 2016-07-14: qty 10

## 2016-07-14 MED ORDER — PHENYLEPHRINE HCL 10 MG/ML IJ SOLN
INTRAMUSCULAR | Status: DC | PRN
  Administered 2016-07-14 (×4): 80 ug via INTRAVENOUS
  Administered 2016-07-14 (×2): 40 ug via INTRAVENOUS

## 2016-07-14 MED ORDER — HYDROCODONE-ACETAMINOPHEN 5-325 MG PO TABS
1.0000 | ORAL_TABLET | ORAL | Status: DC | PRN
Start: 2016-07-14 — End: 2016-07-15
  Administered 2016-07-14 – 2016-07-15 (×4): 2 via ORAL
  Filled 2016-07-14 (×4): qty 2

## 2016-07-14 MED ORDER — EPHEDRINE SULFATE 50 MG/ML IJ SOLN
INTRAMUSCULAR | Status: DC | PRN
  Administered 2016-07-14: 10 mg via INTRAVENOUS

## 2016-07-14 MED ORDER — MENTHOL 3 MG MT LOZG: 1.0000 | LOZENGE | OROMUCOSAL | Status: DC | PRN

## 2016-07-14 MED ORDER — THROMBIN 5000 UNITS EX SOLR
CUTANEOUS | Status: AC
Start: 2016-07-14 — End: 2016-07-14
  Filled 2016-07-14: qty 10000

## 2016-07-14 MED ORDER — FENTANYL CITRATE (PF) 100 MCG/2ML IJ SOLN
INTRAMUSCULAR | Status: AC
  Filled 2016-07-14: qty 2

## 2016-07-14 MED ORDER — ALUM & MAG HYDROXIDE-SIMETH 200-200-20 MG/5ML PO SUSP
30.0000 mL | Freq: Four times a day (QID) | ORAL | Status: DC | PRN
  Administered 2016-07-15: 30 mL via ORAL
  Filled 2016-07-14 (×2): qty 30

## 2016-07-14 MED ORDER — OLOPATADINE HCL 0.7 % OP SOLN: 1.0000 [drp] | Freq: Every day | OPHTHALMIC | Status: DC | PRN

## 2016-07-14 MED ORDER — LIDOCAINE-EPINEPHRINE 1 %-1:100000 IJ SOLN
INTRAMUSCULAR | Status: AC
  Filled 2016-07-14: qty 1

## 2016-07-14 MED ORDER — FLEET ENEMA 7-19 GM/118ML RE ENEM: 1.0000 | ENEMA | Freq: Once | RECTAL | Status: DC | PRN

## 2016-07-14 MED ORDER — LORATADINE 10 MG PO TABS: 10.0000 mg | ORAL_TABLET | Freq: Every day | ORAL | Status: DC

## 2016-07-14 MED ORDER — SODIUM CHLORIDE 0.9% FLUSH
3.0000 mL | Freq: Two times a day (BID) | INTRAVENOUS | Status: DC
  Administered 2016-07-14 (×2): 3 mL via INTRAVENOUS

## 2016-07-14 MED ORDER — GLYCOPYRROLATE 0.2 MG/ML IJ SOLN
INTRAMUSCULAR | Status: DC | PRN
  Administered 2016-07-14: 0.4 mg via INTRAVENOUS

## 2016-07-14 MED ORDER — ALBUTEROL SULFATE (2.5 MG/3ML) 0.083% IN NEBU: 3.0000 mL | INHALATION_SOLUTION | RESPIRATORY_TRACT | Status: DC | PRN

## 2016-07-14 MED ORDER — ACETAMINOPHEN 650 MG RE SUPP: 650.0000 mg | RECTAL | Status: DC | PRN

## 2016-07-14 MED ORDER — BUPIVACAINE HCL (PF) 0.5 % IJ SOLN
INTRAMUSCULAR | Status: DC | PRN
  Administered 2016-07-14: 10 mL

## 2016-07-14 MED ORDER — DOCUSATE SODIUM 100 MG PO CAPS
100.0000 mg | ORAL_CAPSULE | Freq: Two times a day (BID) | ORAL | Status: DC
  Administered 2016-07-14: 100 mg via ORAL
  Filled 2016-07-14: qty 1

## 2016-07-14 MED ORDER — ZOLPIDEM TARTRATE 5 MG PO TABS: 5.0000 mg | ORAL_TABLET | Freq: Every evening | ORAL | Status: DC | PRN

## 2016-07-14 MED ORDER — ONDANSETRON HCL 4 MG/2ML IJ SOLN: 4.0000 mg | Freq: Four times a day (QID) | INTRAMUSCULAR | Status: DC | PRN

## 2016-07-14 MED ORDER — PHENYLEPHRINE HCL 10 MG/ML IJ SOLN
INTRAVENOUS | Status: DC | PRN
  Administered 2016-07-14: 40 ug/min via INTRAVENOUS

## 2016-07-14 MED ORDER — METHOCARBAMOL 1000 MG/10ML IJ SOLN: 500.0000 mg | Freq: Four times a day (QID) | INTRAMUSCULAR | Status: DC | PRN

## 2016-07-14 MED ORDER — MIDAZOLAM HCL 5 MG/5ML IJ SOLN
INTRAMUSCULAR | Status: DC | PRN
  Administered 2016-07-14: 2 mg via INTRAVENOUS

## 2016-07-14 MED ORDER — HYDROMORPHONE HCL 1 MG/ML IJ SOLN
INTRAMUSCULAR | Status: AC
  Filled 2016-07-14: qty 0.5

## 2016-07-14 MED ORDER — THROMBIN 5000 UNITS EX SOLR
CUTANEOUS | Status: DC | PRN
  Administered 2016-07-14 (×2): 5000 [IU] via TOPICAL

## 2016-07-14 MED ORDER — LIDOCAINE-EPINEPHRINE 1 %-1:100000 IJ SOLN
INTRAMUSCULAR | Status: DC | PRN
  Administered 2016-07-14: 10 mL

## 2016-07-14 MED ORDER — BUPIVACAINE HCL (PF) 0.5 % IJ SOLN
INTRAMUSCULAR | Status: AC
  Filled 2016-07-14: qty 30

## 2016-07-14 MED ORDER — LACTATED RINGERS IV SOLN: INTRAVENOUS | Status: DC

## 2016-07-14 MED ORDER — FENTANYL CITRATE (PF) 100 MCG/2ML IJ SOLN
INTRAMUSCULAR | Status: DC | PRN
  Administered 2016-07-14 (×4): 50 ug via INTRAVENOUS
  Administered 2016-07-14: 100 ug via INTRAVENOUS

## 2016-07-14 MED ORDER — LIDOCAINE HCL (CARDIAC) 20 MG/ML IV SOLN
INTRAVENOUS | Status: DC | PRN
  Administered 2016-07-14: 100 mg via INTRAVENOUS

## 2016-07-14 MED ORDER — KCL IN DEXTROSE-NACL 20-5-0.45 MEQ/L-%-% IV SOLN
INTRAVENOUS | Status: DC
  Administered 2016-07-14: 11:00:00 via INTRAVENOUS

## 2016-07-14 MED ORDER — KCL IN DEXTROSE-NACL 20-5-0.45 MEQ/L-%-% IV SOLN
INTRAVENOUS | Status: AC
  Filled 2016-07-14: qty 1000

## 2016-07-14 MED ORDER — CHLORHEXIDINE GLUCONATE CLOTH 2 % EX PADS: 6.0000 | MEDICATED_PAD | Freq: Once | CUTANEOUS | Status: DC

## 2016-07-14 MED ORDER — ROCURONIUM BROMIDE 10 MG/ML (PF) SYRINGE
PREFILLED_SYRINGE | INTRAVENOUS | Status: AC
  Filled 2016-07-14: qty 10

## 2016-07-14 MED ORDER — THROMBIN 5000 UNITS EX SOLR
OROMUCOSAL | Status: DC | PRN
  Administered 2016-07-14: 07:00:00 via TOPICAL

## 2016-07-14 MED ORDER — MIDAZOLAM HCL 2 MG/2ML IJ SOLN
INTRAMUSCULAR | Status: AC
  Filled 2016-07-14: qty 2

## 2016-07-14 MED ORDER — FLUTICASONE PROPIONATE 50 MCG/ACT NA SUSP
2.0000 | Freq: Every day | NASAL | Status: DC
  Filled 2016-07-14: qty 16

## 2016-07-14 MED ORDER — ONDANSETRON HCL 4 MG/2ML IJ SOLN
INTRAMUSCULAR | Status: DC | PRN
  Administered 2016-07-14: 4 mg via INTRAVENOUS

## 2016-07-14 MED ORDER — DEXAMETHASONE SODIUM PHOSPHATE 10 MG/ML IJ SOLN
INTRAMUSCULAR | Status: DC | PRN
  Administered 2016-07-14: 10 mg via INTRAVENOUS

## 2016-07-14 MED ORDER — SUGAMMADEX SODIUM 200 MG/2ML IV SOLN
INTRAVENOUS | Status: AC
  Filled 2016-07-14: qty 2

## 2016-07-14 MED ORDER — ROCURONIUM BROMIDE 100 MG/10ML IV SOLN
INTRAVENOUS | Status: DC | PRN
  Administered 2016-07-14: 50 mg via INTRAVENOUS
  Administered 2016-07-14: 10 mg via INTRAVENOUS
  Administered 2016-07-14 (×2): 20 mg via INTRAVENOUS
  Administered 2016-07-14: 40 mg via INTRAVENOUS

## 2016-07-14 MED ORDER — ONDANSETRON HCL 4 MG PO TABS: 4.0000 mg | ORAL_TABLET | Freq: Four times a day (QID) | ORAL | Status: DC | PRN

## 2016-07-14 MED ORDER — FENTANYL CITRATE (PF) 250 MCG/5ML IJ SOLN
INTRAMUSCULAR | Status: AC
  Filled 2016-07-14: qty 5

## 2016-07-14 MED ORDER — SUGAMMADEX SODIUM 200 MG/2ML IV SOLN
INTRAVENOUS | Status: DC | PRN
  Administered 2016-07-14: 231.4 mg via INTRAVENOUS

## 2016-07-14 MED ORDER — DEXAMETHASONE SODIUM PHOSPHATE 10 MG/ML IJ SOLN
INTRAMUSCULAR | Status: AC
  Filled 2016-07-14: qty 1

## 2016-07-14 MED ORDER — PANTOPRAZOLE SODIUM 40 MG PO TBEC
40.0000 mg | DELAYED_RELEASE_TABLET | Freq: Every day | ORAL | Status: DC
  Administered 2016-07-14: 40 mg via ORAL
  Filled 2016-07-14: qty 1

## 2016-07-14 MED ORDER — ONDANSETRON HCL 4 MG/2ML IJ SOLN
INTRAMUSCULAR | Status: AC
  Filled 2016-07-14: qty 2

## 2016-07-14 MED ORDER — SODIUM CHLORIDE 0.9% FLUSH: 3.0000 mL | INTRAVENOUS | Status: DC | PRN

## 2016-07-14 MED ORDER — BUDESONIDE 0.25 MG/2ML IN SUSP
0.2500 mg | Freq: Two times a day (BID) | RESPIRATORY_TRACT | Status: DC
  Administered 2016-07-14 – 2016-07-15 (×2): 0.25 mg via RESPIRATORY_TRACT
  Filled 2016-07-14 (×2): qty 2

## 2016-07-14 MED ORDER — CEFAZOLIN SODIUM-DEXTROSE 2-4 GM/100ML-% IV SOLN
2.0000 g | Freq: Three times a day (TID) | INTRAVENOUS | Status: AC
  Administered 2016-07-14 (×2): 2 g via INTRAVENOUS
  Filled 2016-07-14 (×2): qty 100

## 2016-07-14 MED ORDER — METHOCARBAMOL 500 MG PO TABS
ORAL_TABLET | ORAL | Status: AC
  Filled 2016-07-14: qty 1

## 2016-07-14 MED ORDER — POLYETHYLENE GLYCOL 3350 17 G PO PACK: 17.0000 g | PACK | Freq: Every day | ORAL | Status: DC | PRN

## 2016-07-14 MED ORDER — LACTATED RINGERS IV SOLN
INTRAVENOUS | Status: DC | PRN
  Administered 2016-07-14 (×2): via INTRAVENOUS

## 2016-07-14 MED ORDER — METOCLOPRAMIDE HCL 5 MG/ML IJ SOLN: 10.0000 mg | Freq: Once | INTRAMUSCULAR | Status: DC | PRN

## 2016-07-14 MED ORDER — PHENYLEPHRINE HCL 10 MG/ML IJ SOLN
INTRAMUSCULAR | Status: AC
  Filled 2016-07-14: qty 1

## 2016-07-14 MED ORDER — VITAMIN C 500 MG PO TABS
1000.0000 mg | ORAL_TABLET | Freq: Every day | ORAL | Status: DC
  Filled 2016-07-14: qty 2

## 2016-07-14 MED ORDER — LIDOCAINE 2% (20 MG/ML) 5 ML SYRINGE
INTRAMUSCULAR | Status: AC
  Filled 2016-07-14: qty 5

## 2016-07-14 MED ORDER — PHENYLEPHRINE 40 MCG/ML (10ML) SYRINGE FOR IV PUSH (FOR BLOOD PRESSURE SUPPORT)
PREFILLED_SYRINGE | INTRAVENOUS | Status: AC
  Filled 2016-07-14: qty 10

## 2016-07-14 MED ORDER — PANTOPRAZOLE SODIUM 20 MG PO TBEC: 20.0000 mg | DELAYED_RELEASE_TABLET | Freq: Every day | ORAL | Status: DC

## 2016-07-14 MED ORDER — HYOSCYAMINE SULFATE ER 0.375 MG PO TB12: 0.3750 mg | ORAL_TABLET | Freq: Every day | ORAL | Status: DC | PRN

## 2016-07-14 MED ORDER — ADULT MULTIVITAMIN W/MINERALS CH: 1.0000 | ORAL_TABLET | Freq: Every day | ORAL | Status: DC

## 2016-07-14 MED ORDER — DIAZEPAM 5 MG PO TABS
5.0000 mg | ORAL_TABLET | Freq: Four times a day (QID) | ORAL | Status: DC | PRN
  Administered 2016-07-14 – 2016-07-15 (×3): 5 mg via ORAL
  Filled 2016-07-14 (×3): qty 1

## 2016-07-14 MED ORDER — NAPROXEN SODIUM 275 MG PO TABS: 275.0000 mg | ORAL_TABLET | Freq: Every day | ORAL | Status: DC | PRN

## 2016-07-14 MED ORDER — PHENOL 1.4 % MT LIQD
1.0000 | OROMUCOSAL | Status: DC | PRN
  Administered 2016-07-14: 1 via OROMUCOSAL
  Filled 2016-07-14: qty 177

## 2016-07-14 MED ORDER — METHOCARBAMOL 500 MG PO TABS
500.0000 mg | ORAL_TABLET | Freq: Four times a day (QID) | ORAL | Status: DC | PRN
  Administered 2016-07-14: 500 mg via ORAL

## 2016-07-14 MED ORDER — BISACODYL 10 MG RE SUPP: 10.0000 mg | Freq: Every day | RECTAL | Status: DC | PRN

## 2016-07-14 MED ORDER — HEMOSTATIC AGENTS (NO CHARGE) OPTIME
TOPICAL | Status: DC | PRN
  Administered 2016-07-14: 1 via TOPICAL

## 2016-07-14 MED ORDER — MEPERIDINE HCL 25 MG/ML IJ SOLN: 6.2500 mg | INTRAMUSCULAR | Status: DC | PRN

## 2016-07-14 MED ORDER — MORPHINE SULFATE (PF) 4 MG/ML IV SOLN
2.0000 mg | INTRAVENOUS | Status: DC | PRN
  Administered 2016-07-14: 2 mg via INTRAVENOUS
  Filled 2016-07-14: qty 1

## 2016-07-14 SURGICAL SUPPLY — 76 items
ADH SKN CLS APL DERMABOND .7 (GAUZE/BANDAGES/DRESSINGS) ×1
BASKET BONE COLLECTION (BASKET) ×2 IMPLANT
BIT DRILL 14X2.5XNS TI ANT (BIT) IMPLANT
BIT DRILL AVIATOR 14 (BIT) ×2
BIT DRILL AVIATOR 14MM (BIT) ×1
BIT DRILL NEURO 2X3.1 SFT TUCH (MISCELLANEOUS) ×1 IMPLANT
BIT DRL 14X2.5XNS TI ANT (BIT) ×1
BLADE ULTRA TIP 2M (BLADE) IMPLANT
BNDG GAUZE ELAST 4 BULKY (GAUZE/BANDAGES/DRESSINGS) ×4 IMPLANT
BUR BARREL STRAIGHT FLUTE 4.0 (BURR) ×3 IMPLANT
CANISTER SUCT 3000ML PPV (MISCELLANEOUS) ×3 IMPLANT
CARTRIDGE OIL MAESTRO DRILL (MISCELLANEOUS) ×1 IMPLANT
COVER MAYO STAND STRL (DRAPES) ×3 IMPLANT
DECANTER SPIKE VIAL GLASS SM (MISCELLANEOUS) ×3 IMPLANT
DERMABOND ADVANCED (GAUZE/BANDAGES/DRESSINGS) ×2
DERMABOND ADVANCED .7 DNX12 (GAUZE/BANDAGES/DRESSINGS) ×1 IMPLANT
DIFFUSER DRILL AIR PNEUMATIC (MISCELLANEOUS) ×3 IMPLANT
DRAPE HALF SHEET 40X57 (DRAPES) IMPLANT
DRAPE LAPAROTOMY 100X72 PEDS (DRAPES) ×3 IMPLANT
DRAPE MICROSCOPE LEICA (MISCELLANEOUS) IMPLANT
DRAPE POUCH INSTRU U-SHP 10X18 (DRAPES) ×3 IMPLANT
DRILL NEURO 2X3.1 SOFT TOUCH (MISCELLANEOUS) ×3
DURAPREP 6ML APPLICATOR 50/CS (WOUND CARE) ×3 IMPLANT
ELECT COATED BLADE 2.86 ST (ELECTRODE) ×3 IMPLANT
ELECT REM PT RETURN 9FT ADLT (ELECTROSURGICAL) ×3
ELECTRODE REM PT RTRN 9FT ADLT (ELECTROSURGICAL) ×1 IMPLANT
GAUZE SPONGE 4X4 12PLY STRL (GAUZE/BANDAGES/DRESSINGS) IMPLANT
GAUZE SPONGE 4X4 16PLY XRAY LF (GAUZE/BANDAGES/DRESSINGS) IMPLANT
GLOVE BIO SURGEON STRL SZ8 (GLOVE) ×3 IMPLANT
GLOVE BIOGEL PI IND STRL 7.5 (GLOVE) IMPLANT
GLOVE BIOGEL PI IND STRL 8 (GLOVE) ×1 IMPLANT
GLOVE BIOGEL PI IND STRL 8.5 (GLOVE) ×1 IMPLANT
GLOVE BIOGEL PI INDICATOR 7.5 (GLOVE) ×4
GLOVE BIOGEL PI INDICATOR 8 (GLOVE) ×2
GLOVE BIOGEL PI INDICATOR 8.5 (GLOVE) ×2
GLOVE ECLIPSE 8.0 STRL XLNG CF (GLOVE) ×3 IMPLANT
GLOVE EXAM NITRILE LRG STRL (GLOVE) IMPLANT
GLOVE EXAM NITRILE XL STR (GLOVE) IMPLANT
GLOVE EXAM NITRILE XS STR PU (GLOVE) IMPLANT
GLOVE SS BIOGEL STRL SZ 7 (GLOVE) IMPLANT
GLOVE SUPERSENSE BIOGEL SZ 7 (GLOVE) ×6
GOWN STRL REUS W/ TWL LRG LVL3 (GOWN DISPOSABLE) IMPLANT
GOWN STRL REUS W/ TWL XL LVL3 (GOWN DISPOSABLE) ×1 IMPLANT
GOWN STRL REUS W/TWL 2XL LVL3 (GOWN DISPOSABLE) ×3 IMPLANT
GOWN STRL REUS W/TWL LRG LVL3 (GOWN DISPOSABLE) ×3
GOWN STRL REUS W/TWL XL LVL3 (GOWN DISPOSABLE) ×3
HALTER HD/CHIN CERV TRACTION D (MISCELLANEOUS) ×3 IMPLANT
HEMOSTAT POWDER KIT SURGIFOAM (HEMOSTASIS) ×2 IMPLANT
KIT BASIN OR (CUSTOM PROCEDURE TRAY) ×3 IMPLANT
KIT ROOM TURNOVER OR (KITS) ×3 IMPLANT
NDL HYPO 18GX1.5 BLUNT FILL (NEEDLE) ×1 IMPLANT
NDL HYPO 25X1 1.5 SAFETY (NEEDLE) ×1 IMPLANT
NDL SPNL 22GX3.5 QUINCKE BK (NEEDLE) ×1 IMPLANT
NEEDLE HYPO 18GX1.5 BLUNT FILL (NEEDLE) ×3 IMPLANT
NEEDLE HYPO 25X1 1.5 SAFETY (NEEDLE) ×3 IMPLANT
NEEDLE SPNL 22GX3.5 QUINCKE BK (NEEDLE) ×3 IMPLANT
NS IRRIG 1000ML POUR BTL (IV SOLUTION) ×3 IMPLANT
OIL CARTRIDGE MAESTRO DRILL (MISCELLANEOUS) ×3
PACK LAMINECTOMY NEURO (CUSTOM PROCEDURE TRAY) ×3 IMPLANT
PAD ARMBOARD 7.5X6 YLW CONV (MISCELLANEOUS) ×3 IMPLANT
PEEK SPACER AVS AS 6X14X16 4D (Cage) ×2 IMPLANT
PEEK SPACER AVS AS 7X14X16X4% (Peek) ×2 IMPLANT
PIN DISTRACTION 14MM (PIN) ×8 IMPLANT
PLATE AVIATOR ASSY 2LVL SZ 34 (Plate) ×2 IMPLANT
PUTTY DBX 1CC (Putty) ×3 IMPLANT
PUTTY DBX 1CC DEPUY (Putty) IMPLANT
RUBBERBAND STERILE (MISCELLANEOUS) ×4 IMPLANT
SCREW AVIATOR VAR SELFTAP 4X14 (Screw) ×12 IMPLANT
SPONGE INTESTINAL PEANUT (DISPOSABLE) ×5 IMPLANT
SPONGE SURGIFOAM ABS GEL SZ50 (HEMOSTASIS) ×2 IMPLANT
STAPLER SKIN PROX WIDE 3.9 (STAPLE) IMPLANT
SUT VIC AB 3-0 SH 8-18 (SUTURE) ×3 IMPLANT
SYR 3ML LL SCALE MARK (SYRINGE) ×3 IMPLANT
TOWEL GREEN STERILE (TOWEL DISPOSABLE) ×3 IMPLANT
TOWEL GREEN STERILE FF (TOWEL DISPOSABLE) ×3 IMPLANT
WATER STERILE IRR 1000ML POUR (IV SOLUTION) ×3 IMPLANT

## 2016-07-14 NOTE — Evaluation (Signed)
Physical Therapy Evaluation Patient Details Name: Thomas [name] MRN: [id number] DOB: 1960/05/19 Today's Date: 07/14/2016   History of Present Illness  Pt is a 56 y/o male s/p C5-7 ACDF. PMH includes asthama, R hand surgery, and abdominal surgery.   Clinical Impression  Patient is s/p above surgery resulting in the deficits listed below (see PT Problem List). PTA, pt was independent with all mobility. Upon evaluation, pt requiring min A to min guard A for safety with mobility. Required manual assist for proper log roll technique, so will need to review in next session. Pt with LOB X 1 during gait requiring min A, otherwise requiring min guard for safety.  Patient will benefit from skilled PT to increase their independence and safety with mobility (while adhering to their precautions) to allow discharge to the venue listed below. Will continue to follow acutely.      Follow Up Recommendations No PT follow up;Supervision/Assistance - 24 hour    Equipment Recommendations  None recommended by PT    Recommendations for Other Services       Precautions / Restrictions Precautions Precautions: Cervical Precaution Comments: Reviewed cervical precaution handout with pt.  Required Braces or Orthoses: Cervical Brace Cervical Brace: Soft collar Restrictions Weight Bearing Restrictions: No      Mobility  Bed Mobility Overal bed mobility: Needs Assistance Bed Mobility: Rolling;Sidelying to Sit Rolling: Min assist Sidelying to sit: Min assist       General bed mobility comments: Min A to ensure appropriate log roll technique. Required assist rolling and with trunk elevation secondary to pain.   Transfers Overall transfer level: Needs assistance Equipment used: None Transfers: Sit to/from Stand Sit to Stand: Min guard         General transfer comment: Min guard for safety. Verbal cues for hand placement while maintaining precautions.   Ambulation/Gait Ambulation/Gait assistance:  Min guard;Min assist Ambulation Distance (Feet): 400 Feet Assistive device: None Gait Pattern/deviations: Step-through pattern;Decreased stride length;Wide base of support Gait velocity: Decreased Gait velocity interpretation: Below normal speed for age/gender General Gait Details: Slow, midly unsteady gait. Pt with 1 LOB during ambulation requiring min A for stability. Otherwise, min guard for safety.   Stairs            Wheelchair Mobility    Modified Rankin (Stroke Patients Only)       Balance Overall balance assessment: Needs assistance Sitting-balance support: Feet supported;No upper extremity supported Sitting balance-Leahy Scale: Good     Standing balance support: No upper extremity supported;During functional activity Standing balance-Leahy Scale: Fair                               Pertinent Vitals/Pain Pain Assessment: 0-10 Pain Score: 9  Pain Location: neck  Pain Descriptors / Indicators: Sore;Operative site guarding Pain Intervention(s): Limited activity within patient's tolerance;Monitored during session;Repositioned    Home Living Family/patient expects to be discharged to:: Private residence Living Arrangements: Spouse/significant other Available Help at Discharge: Family;Available 24 hours/day Type of Home: House Home Access: Level entry     Home Layout: Two level;Able to live on main level with bedroom/bathroom Home Equipment: None      Prior Function Level of Independence: Independent               Hand Dominance        Extremity/Trunk Assessment   Upper Extremity Assessment Upper Extremity Assessment: Defer to OT evaluation    Lower Extremity Assessment  Lower Extremity Assessment: Overall WFL for tasks assessed    Cervical / Trunk Assessment Cervical / Trunk Assessment: Other exceptions Cervical / Trunk Exceptions: s/p surgery   Communication   Communication: No difficulties  Cognition Arousal/Alertness:  Awake/alert Behavior During Therapy: WFL for tasks assessed/performed Overall Cognitive Status: Within Functional Limits for tasks assessed                                        General Comments General comments (skin integrity, edema, etc.): Pt's wife present throughout session. Educated about generalized exercise program to perform at home.     Exercises     Assessment/Plan    PT Assessment Patient needs continued PT services  PT Problem List Decreased mobility;Decreased balance;Decreased knowledge of use of DME;Decreased knowledge of precautions;Pain       PT Treatment Interventions DME instruction;Gait training;Functional mobility training;Therapeutic activities;Therapeutic exercise;Balance training;Neuromuscular re-education;Patient/family education    PT Goals (Current goals can be found in the Care Plan section)  Acute Rehab PT Goals Patient Stated Goal: to decrease pain  PT Goal Formulation: With patient Time For Goal Achievement: 07/21/16 Potential to Achieve Goals: Good    Frequency Min 5X/week   Barriers to discharge        Co-evaluation               AM-PAC PT "6 Clicks" Daily Activity  Outcome Measure Difficulty turning over in bed (including adjusting bedclothes, sheets and blankets)?: Total Difficulty moving from lying on back to sitting on the side of the bed? : Total Difficulty sitting down on and standing up from a chair with arms (e.g., wheelchair, bedside commode, etc,.)?: Total Help needed moving to and from a bed to chair (including a wheelchair)?: A Little Help needed walking in hospital room?: A Little Help needed climbing 3-5 steps with a railing? : A Little 6 Click Score: 12    End of Session Equipment Utilized During Treatment: Gait belt;Cervical collar Activity Tolerance: Patient tolerated treatment well Patient left: in bed;with call bell/phone within reach;with family/visitor present Nurse Communication: Mobility  status PT Visit Diagnosis: Other abnormalities of gait and mobility (R26.89);Unsteadiness on feet (R26.81);Pain Pain - part of body:  (neck )    Time: 7416-3845 PT Time Calculation (min) (ACUTE ONLY): 24 min   Charges:   PT Evaluation $PT Eval Low Complexity: 1 Procedure PT Treatments $Gait Training: 8-22 mins   PT G Codes:        Leighton Ruff, PT, DPT  Acute Rehabilitation Services  Pager: [phone]   Rudean Hitt 07/14/2016, 5:37 PM

## 2016-07-14 NOTE — Progress Notes (Signed)
Pt's only c/o is "trouble breathing". O2 sat 95 % on 2L [place], RR 16-18, breath sounds clear, he is a little anxious. Dr Vertell Limber here and aware. Will continue to monitor and provide freq. Reassurance.

## 2016-07-14 NOTE — Anesthesia Postprocedure Evaluation (Signed)
Anesthesia Post Note  Patient: Thomas [name]  Procedure(s) Performed: Procedure(s) (LRB): Cervical five-six5- Cervical six-seven Anterior cervical decompression/discectomy/fusion (N/A)     Patient location during evaluation: PACU Anesthesia Type: General Level of consciousness: awake and alert Pain management: pain level controlled Vital Signs Assessment: post-procedure vital signs reviewed and stable Respiratory status: spontaneous breathing, nonlabored ventilation, respiratory function stable and patient connected to nasal cannula oxygen Cardiovascular status: blood pressure returned to baseline and stable Postop Assessment: no signs of nausea or vomiting Anesthetic complications: no    Last Vitals:  Vitals:   07/14/16 1137 07/14/16 1209  BP: (!) 142/76 (!) 150/85  Pulse: 91 85  Resp: 18 18  Temp: 36.7 C 36.4 C    Last Pain:  Vitals:   07/14/16 1245  TempSrc:   PainSc: 7                  Montez Hageman

## 2016-07-14 NOTE — Anesthesia Procedure Notes (Signed)
Procedure Name: Intubation Date/Time: 07/14/2016 7:46 AM Performed by: Scheryl Darter Pre-anesthesia Checklist: Patient identified, Emergency Drugs available, Suction available and Patient being monitored Patient Re-evaluated:Patient Re-evaluated prior to inductionOxygen Delivery Method: Circle System Utilized Preoxygenation: Pre-oxygenation with 100% oxygen Intubation Type: IV induction Ventilation: Mask ventilation without difficulty Grade View: Grade II Tube type: Oral Tube size: 8.0 mm Number of attempts: 1 Airway Equipment and Method: Stylet,  Oral airway,  Video-laryngoscopy and Rigid stylet Placement Confirmation: ETT inserted through vocal cords under direct vision,  positive ETCO2 and breath sounds checked- equal and bilateral Secured at: 23 cm Tube secured with: Tape Dental Injury: Teeth and Oropharynx as per pre-operative assessment  Difficulty Due To: Difficulty was anticipated, Difficult Airway- due to large tongue and Difficult Airway- due to reduced neck mobility
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
## 2016-07-14 NOTE — Op Note (Signed)
07/14/2016  10:27 AM  PATIENT:  Thomas [name]  56 y.o. 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, spondylosis, radiculopathy  PROCEDURE:  Procedure(s): Cervical five-six- Cervical six-seven Anterior cervical decompression/discectomy/fusion (N/A) with PEEK cages, autograft, plate  SURGEON:  Surgeon(s) and Role:    Erline Levine, MD - Primary    Consuella Lose, MD - Assisting  PHYSICIAN ASSISTANT:   ASSISTANTS: Poteat, RN   ANESTHESIA:   general  EBL:  Total I/O In: 1000 [I.V.:1000] Out: -   BLOOD ADMINISTERED:none  DRAINS: none   LOCAL MEDICATIONS USED:  MARCAINE    and LIDOCAINE   SPECIMEN:  No Specimen  DISPOSITION OF SPECIMEN:  N/A  COUNTS:  YES  TOURNIQUET:  * No tourniquets in log *  DICTATION: DICTATION: Patient is 56 year old male with right arm pain and weakness with HNP, spondylosis, radiculopathy C5/6 and C6/7  PROCEDURE: Patient was brought to operating room and following the smooth and uncomplicated induction of general endotracheal anesthesia his head was placed on a horseshoe head holder he was placed in 5 pounds of Holter traction and her anterior neck was prepped and draped in usual sterile fashion. An incision was made on the left side of midline after infiltrating the skin and subcutaneous tissues with local lidocaine. The platysmal layer was incised and subplatysmal dissection was performed exposing the anterior border sternocleidomastoid muscle. Using blunt dissection the carotid sheath was kept lateral and trachea and esophagus kept medial exposing the anterior cervical spine. A bent spinal needle was placed it was felt to be the C 45 and C 56  levels and this was confirmed on intraoperative x-ray. Longus coli muscles were taken down from the anterior cervical spine using electrocautery and key elevator  and self-retaining retractor was placed exposing the C5/6 and C6/7 levels. The interspaces were incised and a thorough discectomy was performed. Distraction pins were placed. Initially the C 56 level was operated. Uncinate spurs and central spondylitic ridges were drilled down with a high-speed drill. The spinal cord dura and both C6 nerve roots were widely decompressed. Hemostasis was assured. After trial sizing a 7 mm peek interbody cage was selected and packed with local autograft. This was tamped into position and countersunk appropriately. Attention was the paid to the C 67 level, where similar decompression was performed.  Uncinate spurs and central spondylitic ridges were drilled down with a high-speed drill. The spinal cord dura and both C 7 nerve roots were widely decompressed. Hemostasis was assured. After trial sizing a 7 mm peek interbody cage was selected and packed with local autograft and DBM. This was tamped into position and countersunk appropriately.Distraction weight was removed. A 34 mm Aviator anterior cervical plate was affixed to the cervical spine with 14 mm variable-angle screws 2 at C5, 2 at C6 and 2 at C7. All screws were well-positioned and locking mechanisms were engaged. A final X ray was obtained which showed well positioned grafts and anterior plate without complicating features. Soft tissues were inspected and found to be in good repair. The wound was irrigated. The platysma layer was closed with 3-0 Vicryl stitches and the skin was reapproximated with 3-0 Vicryl subcuticular stitches. The wound was dressed with Dermabond. Counts were correct at the end of the case. Patient was extubated and taken to recovery in stable and satisfactory condition.    PLAN OF  CARE: Admit to inpatient   PATIENT DISPOSITION:  PACU - hemodynamically stable.   Delay start of Pharmacological VTE agent (>24hrs) due to surgical blood loss or risk of bleeding: yes

## 2016-07-14 NOTE — Progress Notes (Signed)
Awake, alert, conversant.  MAEW with full strength bilateral B/T/HI.  Right arm pain and numbness much improved.

## 2016-07-14 NOTE — Anesthesia Preprocedure Evaluation (Signed)
Anesthesia Evaluation  Patient identified by MRN, date of birth, ID band Patient awake    Reviewed: Allergy & Precautions, NPO status , Patient's Chart, lab work & pertinent test results  Airway Mallampati: II  TM Distance: >3 FB Neck ROM: Full    Dental no notable dental hx.    Pulmonary asthma ,    Pulmonary exam normal breath sounds clear to auscultation       Cardiovascular negative cardio ROS Normal cardiovascular exam Rhythm:Regular Rate:Normal     Neuro/Psych negative neurological ROS  negative psych ROS   GI/Hepatic Neg liver ROS, hiatal hernia,   Endo/Other  negative endocrine ROS  Renal/GU negative Renal ROS  negative genitourinary   Musculoskeletal negative musculoskeletal ROS (+)   Abdominal   Peds negative pediatric ROS (+)  Hematology negative hematology ROS (+)   Anesthesia Other Findings   Reproductive/Obstetrics negative OB ROS                             Anesthesia Physical Anesthesia Plan  ASA: II  Anesthesia Plan: General   Post-op Pain Management:    Induction: Intravenous  PONV Risk Score and Plan: 2 and Ondansetron and Dexamethasone  Airway Management Planned: Oral ETT  Additional Equipment:   Intra-op Plan:   Post-operative Plan: Extubation in OR  Informed Consent: I have reviewed the patients History and Physical, chart, labs and discussed the procedure including the risks, benefits and alternatives for the proposed anesthesia with the patient or authorized representative who has indicated his/her understanding and acceptance.   Dental advisory given  Plan Discussed with: CRNA  Anesthesia Plan Comments:         Anesthesia Quick Evaluation

## 2016-07-14 NOTE — Brief Op Note (Signed)
07/14/2016  10:27 AM  PATIENT:  Thomas [name]  56 y.o. 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, spondylosis, radiculopathy  PROCEDURE:  Procedure(s): Cervical five-six- Cervical six-seven Anterior cervical decompression/discectomy/fusion (N/A) with PEEK cages, autograft, plate  SURGEON:  Surgeon(s) and Role:    Erline Levine, MD - Primary    Consuella Lose, MD - Assisting  PHYSICIAN ASSISTANT:   ASSISTANTS: Poteat, RN   ANESTHESIA:   general  EBL:  Total I/O In: 1000 [I.V.:1000] Out: -   BLOOD ADMINISTERED:none  DRAINS: none   LOCAL MEDICATIONS USED:  MARCAINE    and LIDOCAINE   SPECIMEN:  No Specimen  DISPOSITION OF SPECIMEN:  N/A  COUNTS:  YES  TOURNIQUET:  * No tourniquets in log *  DICTATION: DICTATION: Patient is 56 year old male with right arm pain and weakness with HNP, spondylosis, radiculopathy C5/6 and C6/7  PROCEDURE: Patient was brought to operating room and following the smooth and uncomplicated induction of general endotracheal anesthesia his head was placed on a horseshoe head holder he was placed in 5 pounds of Holter traction and her anterior neck was prepped and draped in usual sterile fashion. An incision was made on the left side of midline after infiltrating the skin and subcutaneous tissues with local lidocaine. The platysmal layer was incised and subplatysmal dissection was performed exposing the anterior border sternocleidomastoid muscle. Using blunt dissection the carotid sheath was kept lateral and trachea and esophagus kept medial exposing the anterior cervical spine. A bent spinal needle was placed it was felt to be the C 45 and C 56  levels and this was confirmed on intraoperative x-ray. Longus coli muscles were taken down from the anterior cervical spine using electrocautery and key elevator  and self-retaining retractor was placed exposing the C5/6 and C6/7 levels. The interspaces were incised and a thorough discectomy was performed. Distraction pins were placed. Initially the C 56 level was operated. Uncinate spurs and central spondylitic ridges were drilled down with a high-speed drill. The spinal cord dura and both C6 nerve roots were widely decompressed. Hemostasis was assured. After trial sizing a 7 mm peek interbody cage was selected and packed with local autograft. This was tamped into position and countersunk appropriately. Attention was the paid to the C 67 level, where similar decompression was performed.  Uncinate spurs and central spondylitic ridges were drilled down with a high-speed drill. The spinal cord dura and both C 7 nerve roots were widely decompressed. Hemostasis was assured. After trial sizing a 7 mm peek interbody cage was selected and packed with local autograft and DBM. This was tamped into position and countersunk appropriately.Distraction weight was removed. A 34 mm Aviator anterior cervical plate was affixed to the cervical spine with 14 mm variable-angle screws 2 at C5, 2 at C6 and 2 at C7. All screws were well-positioned and locking mechanisms were engaged. A final X ray was obtained which showed well positioned grafts and anterior plate without complicating features. Soft tissues were inspected and found to be in good repair. The wound was irrigated. The platysma layer was closed with 3-0 Vicryl stitches and the skin was reapproximated with 3-0 Vicryl subcuticular stitches. The wound was dressed with Dermabond. Counts were correct at the end of the case. Patient was extubated and taken to recovery in stable and satisfactory condition.    PLAN OF  CARE: Admit to inpatient   PATIENT DISPOSITION:  PACU - hemodynamically stable.   Delay start of Pharmacological VTE agent (>24hrs) due to surgical blood loss or risk of bleeding: yes

## 2016-07-14 NOTE — Interval H&P Note (Signed)
History and Physical Interval Note:  07/14/2016 7:17 AM  Thomas [name]  has presented today for surgery, with the diagnosis of Cervical herniated disc  The various methods of treatment have been discussed with the patient and family. After consideration of risks, benefits and other options for treatment, the patient has consented to  Procedure(s) with comments: C5-6 C6-7 Anterior cervical decompression/discectomy/fusion (N/A) - C5-6 C6-7 Anterior cervical decompression/discectomy/fusion as a surgical intervention .  The patient's history has been reviewed, patient examined, no change in status, stable for surgery.  I have reviewed the patient's chart and labs.  Questions were answered to the patient's satisfaction.     [name] D

## 2016-07-14 NOTE — Transfer of Care (Signed)
Immediate Anesthesia Transfer of Care Note  Patient: Thomas [name]  Procedure(s) Performed: Procedure(s): Cervical five-six5- Cervical six-seven Anterior cervical decompression/discectomy/fusion (N/A)  Patient Location: PACU  Anesthesia Type:General    Level of Consciousness: awake, alert , oriented and sedated  Airway & Oxygen Therapy: Patient Spontanous Breathing and Patient connected to nasal cannula oxygen  Post-op Assessment: Report given to RN, Post -op Vital signs reviewed and stable and Patient moving all extremities  Post vital signs: Reviewed and stable  Last Vitals:  Vitals:   07/14/16 0621  BP: (!) 153/80  Pulse: 71  Resp: 20  Temp: 36.7 C    Last Pain:  Vitals:   07/14/16 0627  TempSrc:   PainSc: 4       Patients Stated Pain Goal: 3 (22/44/97 5300)  Complications: No apparent anesthesia complications

## 2016-07-15 ENCOUNTER — Encounter (HOSPITAL_COMMUNITY): Payer: Self-pay | Admitting: Neurosurgery

## 2016-07-15 DIAGNOSIS — M50122 Cervical disc disorder at C5-C6 level with radiculopathy: Secondary | ICD-10-CM | POA: Diagnosis not present

## 2016-07-15 NOTE — Progress Notes (Addendum)
Subjective: Patient reports "I'm feeling better. He was really good helping me with the pain medicine over night"  Objective: Vital signs in last 24 hours: Temp:  [97.6 F (36.4 C)-98.6 F (37 C)] 98.4 F (36.9 C) (06/15 0411) Pulse Rate:  [77-101] 77 (06/15 0411) Resp:  [10-23] 20 (06/15 0411) BP: (131-156)/(75-85) 149/80 (06/15 0411) SpO2:  [93 %-98 %] 98 % (06/15 0411)  Intake/Output from previous day: 06/14 0701 - 06/15 0700 In: 1850 [P.O.:300; I.V.:1550] Out: 75 [Blood:75] Intake/Output this shift: No intake/output data recorded.  Alert, conversant. Incision flat, without erythema or drainage beneath honeycomb & Dermabond. Good strength BUE and hand intrinsics. Pt reports no arm pain or numbness, only posterior cervical/trap soreness as expected.   Lab Results: No results for input(s): WBC, HGB, HCT, PLT in the last 72 hours. BMET No results for input(s): NA, K, CL, CO2, GLUCOSE, BUN, CREATININE, CALCIUM in the last 72 hours.  Studies/Results: Dg Cervical Spine 2-3 Views  Result Date: 07/14/2016 CLINICAL DATA:  C5-6 and C6-7 ACDF planned for herniated discs. EXAM: CERVICAL SPINE - 2-3 VIEW COMPARISON:  Cervical MRI of May 30, 2016 FINDINGS: To cross-table lateral intraoperative views early limited due to overlap of the shoulders and upper thoracic soft tissues over the surgical site. On the initial image metallic needles project along the anterior aspect of the C4-5 disc. A lower needle tip is poorly visualized but appears to lie anterior to the C5 vertebral body or at the C5-6 disc. The second image reveals a cortical screw to have been placed in the body of C5. C6 cannot be adequately assessed. IMPRESSION: Limited radiographs which do not reveal the surgical site optimally. Electronically Signed   By: David  Martinique M.D.   On: 07/14/2016 10:08    Assessment/Plan: Improving   LOS: 1 day  Per DrStern, d/c IV, d/c to home. Rx's for Norco 5/325 and Tizanidine 4mg  will be  eRxed for prn home use. Pt verbalizes understanding of d/c instructions. He has f/u appt scheduled for July 2nd.   Verdis Prime 07/15/2016, 7:23 AM  Doing well.  Discharge home.

## 2016-07-15 NOTE — Discharge Summary (Signed)
Physician Discharge Summary  Patient ID: Thomas [name] MRN: [id number] DOB/AGE: July 31, 1960 56 y.o.  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 Following uncomplicated ACDF V7-8, he recovered nicely and transferred to [facility] for nursing care and therapy evaluation.   Consults: None  Significant Diagnostic Studies: radiology: X-Ray: intra-op  Treatments: surgery: Cervical five-six- Cervical six-seven Anterior cervical decompression/discectomy/fusion (N/A) with PEEK cages, autograft, plate    Discharge Exam: Blood pressure (!) 149/80, pulse 77, temperature 98.4 F (36.9 C), temperature source Oral, resp. rate 20, height 5' 11.5" (1.816 m), weight 115.7 kg (255 lb), SpO2 98 %. Alert, conversant. Incision flat, without erythema or drainage beneath honeycomb &Dermabond. Good strength BUE and hand intrinsics. Pt reports no arm pain or numbness, only posterior cervical/trap soreness as expected.    Disposition:  Discharge to home. Rx's for Norco 5/325 and Tizanidine 4mg  will be eRxed for prn home use. Pt verbalizes understanding of d/c instructions. He has f/u appt scheduled for July 2nd.    Allergies as of 07/15/2016      Reactions   No Known Allergies       Medication List    TAKE these medications   azithromycin 250 MG tablet Commonly known as:  ZITHROMAX Z-PAK As directed   beclomethasone 80 MCG/ACT inhaler Commonly known as:  QVAR  REDIHALER Inhale 1 puff into the lungs 2 (two) times daily.   cetirizine 10 MG tablet Commonly known as:  ZYRTEC Take 10 mg by mouth daily as needed for allergies.   chlorpheniramine-HYDROcodone 10-8 MG/5ML Suer Commonly known as:  TUSSIONEX PENNKINETIC ER Take 5 mLs by mouth every 12 (twelve) hours as needed.   Fish Oil 1000 MG Caps Take 1,000 mg by mouth daily.   fluticasone 50 MCG/ACT nasal spray Commonly known as:  FLONASE PLACE 2 SPRAYS INTO BOTH NOSTRILS DAILY.   hyoscyamine 0.375 MG 12 hr tablet Commonly known as:  LEVBID Take 0.375 mg by mouth daily as needed for cramping.   lansoprazole 15 MG capsule Commonly known as:  PREVACID Take 15 mg by mouth daily.   multivitamin with minerals Tabs tablet Take 1 tablet by mouth daily.   naproxen sodium 220 MG tablet Commonly known as:  ANAPROX Take 220 mg by mouth daily as needed (pain).   PAZEO 0.7 % Soln Generic drug:  Olopatadine HCl Place 1 drop into the right eye daily as needed for allergies.   PROAIR HFA 108 (90 Base) MCG/ACT inhaler Generic drug:  albuterol INHALE 2 PUFFS EVERY 6 HOURS AS NEEDED FOR WHEEZING OR SHORTNESS OF BREATH.   vitamin C 1000 MG tablet Take 1,000 mg by mouth daily.   ZINC PO Take 1 tablet by mouth daily.        Signed: Peggyann Shoals, MD 07/15/2016, 7:59 AM

## 2016-07-15 NOTE — Evaluation (Signed)
Occupational Therapy Evaluation Patient Details Name: Thomas [name] MRN: [id number] DOB: 1960/10/05 Today's Date: 07/15/2016    History of Present Illness Pt is a 56 y/o male s/p C5-7 ACDF. PMH includes asthama, R hand surgery, and abdominal surgery.    Clinical Impression   Patient evaluated by Occupational Therapy with no further acute OT needs identified. All education has been completed and the patient has no further questions. See below for any follow-up Occupational Therapy or equipment needs. OT to sign off. Thank you for referral.      Follow Up Recommendations  No OT follow up    Equipment Recommendations  None recommended by OT    Recommendations for Other Services       Precautions / Restrictions Precautions Precautions: Cervical Precaution Comments: reviewed in detail for ACDF and adls Required Braces or Orthoses: Cervical Brace Cervical Brace: Soft collar Restrictions Weight Bearing Restrictions: No      Mobility Bed Mobility Overal bed mobility: Needs Assistance Bed Mobility: Rolling;Sidelying to Sit Rolling: Supervision Sidelying to sit: Supervision       General bed mobility comments: pt with correct sequence for bed mobility pt with strong core activation  Transfers Overall transfer level: Needs assistance Equipment used: None Transfers: Sit to/from Stand Sit to Stand: Supervision              Balance Overall balance assessment: Needs assistance Sitting-balance support: Feet supported;No upper extremity supported Sitting balance-Leahy Scale: Good     Standing balance support: No upper extremity supported;During functional activity Standing balance-Leahy Scale: Good                             ADL either performed or assessed with clinical judgement   ADL Overall ADL's : Needs assistance/impaired Eating/Feeding: Independent   Grooming: Independent Grooming Details (indicate cue type and reason): advised on shaving and  ACDF precautions. Upper Body Bathing: Independent   Lower Body Bathing: Min guard           Toilet Transfer: Min guard       Tub/ Shower Transfer: Supervision/safety   Functional mobility during ADLs: Supervision/safety General ADL Comments: Pt will have wife (A) upon d/c and educated on all neck precautions with adls.   Pt educated on bathing and avoid washing directly on incision. Pt educated to use new wash cloth and towel each day. Pt educated to allow water to run across dressing and not to soak in a tub at this time. Pt advised RN will instruct on any bandages required otherwise is open to air.      Vision Baseline Vision/History: Wears glasses Wears Glasses: Reading only       Perception     Praxis      Pertinent Vitals/Pain Pain Assessment: 0-10 Pain Score: 5  Pain Location: neck  Pain Descriptors / Indicators: Sore;Operative site guarding Pain Intervention(s): Premedicated before session;Repositioned     Hand Dominance Right   Extremity/Trunk Assessment Upper Extremity Assessment Upper Extremity Assessment: Generalized weakness;RUE deficits/detail RUE Deficits / Details: PTA R UE painful and reports grasp okay. observed with breakfast   Lower Extremity Assessment Lower Extremity Assessment: Defer to PT evaluation   Cervical / Trunk Assessment Cervical / Trunk Assessment: Other exceptions Cervical / Trunk Exceptions: s/p surgery    Communication Communication Communication: No difficulties   Cognition Arousal/Alertness: Awake/alert Behavior During Therapy: WFL for tasks assessed/performed Overall Cognitive Status: Within Functional Limits for tasks assessed  General Comments  educated on don doff brace and return demo. Pt with dressing still in place    Exercises     Shoulder Instructions      Home Living Family/patient expects to be discharged to:: Private residence Living Arrangements:  Spouse/significant other Available Help at Discharge: Family;Available 24 hours/day Type of Home: House Home Access: Level entry     Home Layout: Two level;Able to live on main level with bedroom/bathroom     Bathroom Shower/Tub: Teacher, early years/pre: Standard     Home Equipment: None          Prior Functioning/Environment Level of Independence: Independent                 OT Problem List:        OT Treatment/Interventions:      OT Goals(Current goals can be found in the care plan section) Acute Rehab OT Goals Patient Stated Goal: to decrease pain  Potential to Achieve Goals: Good  OT Frequency:     Barriers to D/C:            Co-evaluation              AM-PAC PT "6 Clicks" Daily Activity     Outcome Measure Help from another person eating meals?: None Help from another person taking care of personal grooming?: None Help from another person toileting, which includes using toliet, bedpan, or urinal?: None Help from another person bathing (including washing, rinsing, drying)?: None Help from another person to put on and taking off regular upper body clothing?: None Help from another person to put on and taking off regular lower body clothing?: None 6 Click Score: 24   End of Session Equipment Utilized During Treatment: Cervical collar Nurse Communication: Mobility status;Precautions  Activity Tolerance: Patient tolerated treatment well Patient left: in chair;with call bell/phone within reach;with family/visitor present  OT Visit Diagnosis: Unsteadiness on feet (R26.81)                Time: 8757-9728 OT Time Calculation (min): 15 min Charges:  OT General Charges $OT Visit: 1 Procedure OT Evaluation $OT Eval Moderate Complexity: 1 Procedure G-Codes:      Jeri Modena   OTR/L Pager: [phone] Office: [phone] .   Parke Poisson B 07/15/2016, 8:40 AM

## 2016-07-15 NOTE — Progress Notes (Signed)
Physical Therapy Treatment Patient Details Name: Thomas [name] MRN: [id number] DOB: January 23, 1961 Today's Date: 07/15/2016    History of Present Illness Pt is a 56 y/o male s/p C5-7 ACDF. PMH includes asthama, R hand surgery, and abdominal surgery.     PT Comments    Pt demonstrated improved mobility and activity tolerance today. Pt able to ambulate without AD with no LOB or unsteadiness during gait. Pt able to recall 2/3 spinal precautions, and demonstrated good technique with log rolling. PT provided education about safety with car transfers to ensure safe d/c home. Pt's wife present during session and demonstrates good knowledge for assistance upon d/c. Current plan of care remains appropriate.  Follow Up Recommendations  No PT follow up;Supervision/Assistance - 24 hour     Equipment Recommendations  None recommended by PT    Recommendations for Other Services       Precautions / Restrictions Precautions Precautions: Cervical Required Braces or Orthoses: Cervical Brace Cervical Brace: Soft collar Restrictions Weight Bearing Restrictions: No    Mobility  Bed Mobility Overal bed mobility: Needs Assistance Bed Mobility: Rolling;Sidelying to Sit Rolling: Supervision Sidelying to sit: Supervision       General bed mobility comments:  supervision for safety. Pt demonstrated good log rolling technique.   Transfers Overall transfer level: Needs assistance Equipment used: None Transfers: Sit to/from Stand Sit to Stand: Supervision         General transfer comment: supervision for safety  Ambulation/Gait Ambulation/Gait assistance: Supervision Ambulation Distance (Feet): 340 Feet Assistive device: None Gait Pattern/deviations: Step-through pattern;Decreased stride length Gait velocity: decreased Gait velocity interpretation: Below normal speed for age/gender General Gait Details: Pt ambulated 347ft with no AD. Pt demonstrated improved stability with no LOB or  unsteadiness during gait.    Stairs            Wheelchair Mobility    Modified Rankin (Stroke Patients Only)       Balance Overall balance assessment: Needs assistance Sitting-balance support: Feet supported;No upper extremity supported Sitting balance-Leahy Scale: Good     Standing balance support: No upper extremity supported;During functional activity Standing balance-Leahy Scale: Good                              Cognition Arousal/Alertness: Awake/alert Behavior During Therapy: WFL for tasks assessed/performed Overall Cognitive Status: Within Functional Limits for tasks assessed                                        Exercises      General Comments General comments (skin integrity, edema, etc.): Pt's wife present during session. PT provided education about maintaining spinal precautions and safety with car transfers.       Pertinent Vitals/Pain Pain Assessment: 0-10 Pain Score: 5  Faces Pain Scale: Hurts whole lot Pain Location: neck  Pain Descriptors / Indicators: Sore;Operative site guarding Pain Intervention(s): Premedicated before session;Repositioned    Home Living Family/patient expects to be discharged to:: Private residence Living Arrangements: Spouse/significant other Available Help at Discharge: Family;Available 24 hours/day Type of Home: House Home Access: Level entry   Home Layout: Two level;Able to live on main level with bedroom/bathroom Home Equipment: None      Prior Function Level of Independence: Independent          PT Goals (current goals can now be found in the care  plan section) Acute Rehab PT Goals Patient Stated Goal: to decrease pain  PT Goal Formulation: With patient Time For Goal Achievement: 07/21/16 Potential to Achieve Goals: Good Progress towards PT goals: Progressing toward goals    Frequency    Min 5X/week      PT Plan Current plan remains appropriate    Co-evaluation               AM-PAC PT "6 Clicks" Daily Activity  Outcome Measure  Difficulty turning over in bed (including adjusting bedclothes, sheets and blankets)?: A Little Difficulty moving from lying on back to sitting on the side of the bed? : A Little Difficulty sitting down on and standing up from a chair with arms (e.g., wheelchair, bedside commode, etc,.)?: A Little Help needed moving to and from a bed to chair (including a wheelchair)?: A Little Help needed walking in hospital room?: A Little Help needed climbing 3-5 steps with a railing? : A Little 6 Click Score: 18    End of Session Equipment Utilized During Treatment: Gait belt;Cervical collar Activity Tolerance: Patient tolerated treatment well Patient left: in bed;with call bell/phone within reach;with family/visitor present Nurse Communication: Mobility status PT Visit Diagnosis: Unsteadiness on feet (R26.81);Other abnormalities of gait and mobility (R26.89);Pain Pain - part of body:  (neck)     Time: 6770-3403 PT Time Calculation (min) (ACUTE ONLY): 10 min  Charges:  $Gait Training: 8-22 mins                    G Codes:       Loma Sousa, SPT  [phone]   Loma Sousa 07/15/2016, 8:55 AM

## 2016-07-15 NOTE — Progress Notes (Signed)
Patient is being discharged home. Discharge instructions were given to patient and family 

## 2016-08-17 ENCOUNTER — Encounter: Payer: Self-pay | Admitting: Physician Assistant

## 2016-08-17 ENCOUNTER — Ambulatory Visit (INDEPENDENT_AMBULATORY_CARE_PROVIDER_SITE_OTHER): Payer: BLUE CROSS/BLUE SHIELD | Admitting: Physician Assistant

## 2016-08-17 DIAGNOSIS — B9689 Other specified bacterial agents as the cause of diseases classified elsewhere: Secondary | ICD-10-CM

## 2016-08-17 DIAGNOSIS — J019 Acute sinusitis, unspecified: Secondary | ICD-10-CM | POA: Diagnosis not present

## 2016-08-17 MED ORDER — FLUTICASONE PROPIONATE 50 MCG/ACT NA SUSP: 2.0000 | Freq: Every day | NASAL | 3 refills | Status: DC

## 2016-08-17 MED ORDER — METHYLPREDNISOLONE ACETATE 80 MG/ML IJ SUSP
80.0000 mg | Freq: Once | INTRAMUSCULAR | Status: AC
  Administered 2016-08-17: 80 mg via INTRAMUSCULAR

## 2016-08-17 MED ORDER — BECLOMETHASONE DIPROP HFA 80 MCG/ACT IN AERB: 1.0000 | INHALATION_SPRAY | Freq: Two times a day (BID) | RESPIRATORY_TRACT | 0 refills | Status: DC

## 2016-08-17 MED ORDER — AMOXICILLIN-POT CLAVULANATE 875-125 MG PO TABS: 1.0000 | ORAL_TABLET | Freq: Two times a day (BID) | ORAL | 0 refills | Status: DC

## 2016-08-17 MED ORDER — ALBUTEROL SULFATE HFA 108 (90 BASE) MCG/ACT IN AERS: INHALATION_SPRAY | RESPIRATORY_TRACT | 2 refills | Status: DC

## 2016-08-17 NOTE — Patient Instructions (Signed)
Please take antibiotic as directed.  Increase fluid intake.  Use Saline nasal spray.  Take a daily multivitamin. Continue chronic medications as directed.  Place a humidifier in the bedroom.  Please call or return clinic if symptoms are not improving.  Sinusitis Sinusitis is redness, soreness, and swelling (inflammation) of the paranasal sinuses. Paranasal sinuses are air pockets within the bones of your face (beneath the eyes, the middle of the forehead, or above the eyes). In healthy paranasal sinuses, mucus is able to drain out, and air is able to circulate through them by way of your nose. However, when your paranasal sinuses are inflamed, mucus and air can become trapped. This can allow bacteria and other germs to grow and cause infection. Sinusitis can develop quickly and last only a short time (acute) or continue over a long period (chronic). Sinusitis that lasts for more than 12 weeks is considered chronic.  CAUSES  Causes of sinusitis include:  Allergies.  Structural abnormalities, such as displacement of the cartilage that separates your nostrils (deviated septum), which can decrease the air flow through your nose and sinuses and affect sinus drainage.  Functional abnormalities, such as when the small hairs (cilia) that line your sinuses and help remove mucus do not work properly or are not present. SYMPTOMS  Symptoms of acute and chronic sinusitis are the same. The primary symptoms are pain and pressure around the affected sinuses. Other symptoms include:  Upper toothache.  Earache.  Headache.  Bad breath.  Decreased sense of smell and taste.  A cough, which worsens when you are lying flat.  Fatigue.  Fever.  Thick drainage from your nose, which often is green and may contain pus (purulent).  Swelling and warmth over the affected sinuses. DIAGNOSIS  Your caregiver will perform a physical exam. During the exam, your caregiver may:  Look in your nose for signs of  abnormal growths in your nostrils (nasal polyps).  Tap over the affected sinus to check for signs of infection.  View the inside of your sinuses (endoscopy) with a special imaging device with a light attached (endoscope), which is inserted into your sinuses. If your caregiver suspects that you have chronic sinusitis, one or more of the following tests may be recommended:  Allergy tests.  Nasal culture A sample of mucus is taken from your nose and sent to a lab and screened for bacteria.  Nasal cytology A sample of mucus is taken from your nose and examined by your caregiver to determine if your sinusitis is related to an allergy. TREATMENT  Most cases of acute sinusitis are related to a viral infection and will resolve on their own within 10 days. Sometimes medicines are prescribed to help relieve symptoms (pain medicine, decongestants, nasal steroid sprays, or saline sprays).  However, for sinusitis related to a bacterial infection, your caregiver will prescribe antibiotic medicines. These are medicines that will help kill the bacteria causing the infection.  Rarely, sinusitis is caused by a fungal infection. In theses cases, your caregiver will prescribe antifungal medicine. For some cases of chronic sinusitis, surgery is needed. Generally, these are cases in which sinusitis recurs more than 3 times per year, despite other treatments. HOME CARE INSTRUCTIONS   Drink plenty of water. Water helps thin the mucus so your sinuses can drain more easily.  Use a humidifier.  Inhale steam 3 to 4 times a day (for example, sit in the bathroom with the shower running).  Apply a warm, moist washcloth to your face 3  to 4 times a day, or as directed by your caregiver.  Use saline nasal sprays to help moisten and clean your sinuses.  Take over-the-counter or prescription medicines for pain, discomfort, or fever only as directed by your caregiver. SEEK IMMEDIATE MEDICAL CARE IF:  You have increasing  pain or severe headaches.  You have nausea, vomiting, or drowsiness.  You have swelling around your face.  You have vision problems.  You have a stiff neck.  You have difficulty breathing. MAKE SURE YOU:   Understand these instructions.  Will watch your condition.  Will get help right away if you are not doing well or get worse. Document Released: 01/17/2005 Document Revised: 04/11/2011 Document Reviewed: 02/01/2011 [facility] Patient Information 2014 [place], [place].

## 2016-08-17 NOTE — Progress Notes (Signed)
Patient presents to clinic today c/o 2.5 weeks of nasal congestion with sinus pressure and sinus pain. Endorses R ear pressure. Denies fever, chills, chest pain or SOB. Some mild chest tightness last night. Has been using inhalers as directed but requesting refills. Denies recent travel or sick contact..   Past Medical History:  Diagnosis Date  . Allergy   . Asthma   . GERD (gastroesophageal reflux disease)   . History of hiatal hernia   . Pneumonia    5 -7 yrs ago    Current Outpatient Prescriptions on File Prior to Visit  Medication Sig Dispense Refill  . Ascorbic Acid (VITAMIN C) 1000 MG tablet Take 1,000 mg by mouth daily.    Marland Kitchen azithromycin (ZITHROMAX Z-PAK) 250 MG tablet As directed (Patient not taking: Reported on 07/06/2016) 6 each 0  . Beclomethasone Diprop HFA (QVAR REDIHALER) 80 MCG/ACT AERB Inhale 1 puff into the lungs 2 (two) times daily. 1 Inhaler 0  . cetirizine (ZYRTEC) 10 MG tablet Take 10 mg by mouth daily as needed for allergies.    . chlorpheniramine-HYDROcodone (TUSSIONEX PENNKINETIC ER) 10-8 MG/5ML SUER Take 5 mLs by mouth every 12 (twelve) hours as needed. (Patient not taking: Reported on 07/06/2016) 120 mL 0  . fluticasone (FLONASE) 50 MCG/ACT nasal spray PLACE 2 SPRAYS INTO BOTH NOSTRILS DAILY. 16 g 3  . hyoscyamine (LEVBID) 0.375 MG 12 hr tablet Take 0.375 mg by mouth daily as needed for cramping.   11  . lansoprazole (PREVACID) 15 MG capsule Take 15 mg by mouth daily.     . Multiple Vitamin (MULTIVITAMIN WITH MINERALS) TABS tablet Take 1 tablet by mouth daily.    . Multiple Vitamins-Minerals (ZINC PO) Take 1 tablet by mouth daily.    . naproxen sodium (ANAPROX) 220 MG tablet Take 220 mg by mouth daily as needed (pain).    . Omega-3 Fatty Acids (FISH OIL) 1000 MG CAPS Take 1,000 mg by mouth daily.    Marland Kitchen PAZEO 0.7 % SOLN Place 1 drop into the right eye daily as needed for allergies.    Marland Kitchen PROAIR HFA 108 (90 Base) MCG/ACT inhaler INHALE 2 PUFFS EVERY 6 HOURS AS NEEDED  FOR WHEEZING OR SHORTNESS OF BREATH. 8.5 Inhaler 2   No current facility-administered medications on file prior to visit.     Allergies  Allergen Reactions  . No Known Allergies     Family History  Problem Relation Age of Onset  . Cancer Father   . Diabetes Unknown        maternal family    Social History   Social History  . Marital status: Married    Spouse name: N/A  . Number of children: N/A  . Years of education: N/A   Social History Main Topics  . Smoking status: Never Smoker  . Smokeless tobacco: Never Used  . Alcohol use Yes     Comment: rarely  . Drug use: No  . Sexual activity: Not on file   Other Topics Concern  . Not on file   Social History Narrative  . No narrative on file    Review of Systems - See HPI.  All other ROS are negative.  There were no vitals taken for this visit.  Physical Exam  Constitutional: He is oriented to person, place, and time and well-developed, well-nourished, and in no distress.  HENT:  Head: Normocephalic and atraumatic.  Right Ear: External ear normal. A middle ear effusion is present.  Left Ear: External ear  normal.  Nose: Mucosal edema and rhinorrhea present. Right sinus exhibits maxillary sinus tenderness. Left sinus exhibits maxillary sinus tenderness.  Mouth/Throat: Uvula is midline, oropharynx is clear and moist and mucous membranes are normal.  Eyes: Pupils are equal, round, and reactive to light. Conjunctivae are normal.  Neck: Neck supple.  Cardiovascular: Normal rate, regular rhythm, normal heart sounds and intact distal pulses.   Pulmonary/Chest: Effort normal and breath sounds normal. No respiratory distress. He has no wheezes. He has no rales. He exhibits no tenderness.  Lymphadenopathy:    He has no cervical adenopathy.  Neurological: He is alert and oriented to person, place, and time.  Skin: Skin is warm and dry. No rash noted.  Psychiatric: Affect normal.  Vitals reviewed.   Recent Results (from  the past 2160 hour(s))  Surgical pcr screen     Status: None   Collection Time: 07/11/16 12:11 PM  Result Value Ref Range   MRSA, PCR NEGATIVE NEGATIVE   Staphylococcus aureus NEGATIVE NEGATIVE    Comment:        The Xpert SA Assay (FDA approved for NASAL specimens in patients over 53 years of age), is one component of a comprehensive surveillance program.  Test performance has been validated by [facility] for patients greater than or equal to 38 year old. It is not intended to diagnose infection nor to guide or monitor treatment.   CBC     Status: None   Collection Time: 07/11/16 12:11 PM  Result Value Ref Range   WBC 7.1 4.0 - 10.5 K/uL   RBC 4.71 4.22 - 5.81 MIL/uL   Hemoglobin 14.4 13.0 - 17.0 g/dL   HCT 42.3 39.0 - 52.0 %   MCV 89.8 78.0 - 100.0 fL   MCH 30.6 26.0 - 34.0 pg   MCHC 34.0 30.0 - 36.0 g/dL   RDW 13.0 11.5 - 15.5 %   Platelets 291 150 - 400 K/uL  Basic metabolic panel     Status: Abnormal   Collection Time: 07/11/16 12:11 PM  Result Value Ref Range   Sodium 138 135 - 145 mmol/L   Potassium 4.1 3.5 - 5.1 mmol/L   Chloride 106 101 - 111 mmol/L   CO2 25 22 - 32 mmol/L   Glucose, Bld 130 (H) 65 - 99 mg/dL   BUN 12 6 - 20 mg/dL   Creatinine, Ser 1.16 0.61 - 1.24 mg/dL   Calcium 9.3 8.9 - 10.3 mg/dL   GFR calc non Af Amer >60 >60 mL/min   GFR calc Af Amer >60 >60 mL/min    Comment: (NOTE) The eGFR has been calculated using the CKD EPI equation. This calculation has not been validated in all clinical situations. eGFR's persistently <60 mL/min signify possible Chronic Kidney Disease.    Anion gap 7 5 - 15  Type and screen [place]     Status: None   Collection Time: 07/11/16 12:15 PM  Result Value Ref Range   ABO/RH(D) O POS    Antibody Screen NEG    Sample Expiration 07/25/2016    Extend sample reason NO TRANSFUSIONS OR PREGNANCY IN THE PAST 3 MONTHS   ABO/Rh     Status: None   Collection Time: 07/11/16 12:15 PM  Result Value  Ref Range   ABO/RH(D) O POS     Assessment/Plan: Rx Augmentin.  IM Depomedrol given. Increase fluids.  Rest.  Saline nasal spray.  Probiotic.  Mucinex as directed.  Humidifier in bedroom. Continue chronic medications.  Call or return to clinic if symptoms are not improving.   Leeanne Rio, PA-C

## 2016-08-17 NOTE — Progress Notes (Signed)
Pre visit review using our clinic review tool, if applicable. No additional management support is needed unless otherwise documented below in the visit note. 

## 2016-08-24 ENCOUNTER — Encounter: Payer: Self-pay | Admitting: Physician Assistant

## 2016-08-28 ENCOUNTER — Encounter: Payer: Self-pay | Admitting: Family Medicine

## 2016-08-29 ENCOUNTER — Other Ambulatory Visit: Payer: Self-pay | Admitting: Family Medicine

## 2016-08-29 DIAGNOSIS — J329 Chronic sinusitis, unspecified: Secondary | ICD-10-CM

## 2016-08-30 DIAGNOSIS — J339 Nasal polyp, unspecified: Secondary | ICD-10-CM | POA: Insufficient documentation

## 2016-08-30 DIAGNOSIS — J329 Chronic sinusitis, unspecified: Secondary | ICD-10-CM | POA: Insufficient documentation

## 2016-10-04 ENCOUNTER — Other Ambulatory Visit: Payer: Self-pay | Admitting: Physician Assistant

## 2016-10-04 NOTE — Telephone Encounter (Signed)
Will defer further refills of patient's medications to PCP  

## 2016-10-05 ENCOUNTER — Encounter: Payer: Self-pay | Admitting: General Practice

## 2016-10-05 NOTE — Telephone Encounter (Signed)
Pt is overdue for CPE- must schedule if not already

## 2016-10-05 NOTE — Telephone Encounter (Signed)
Called pt to inform, could not leave a message. Letter mailed to inform.

## 2016-11-09 ENCOUNTER — Other Ambulatory Visit: Payer: Self-pay | Admitting: General Practice

## 2016-11-09 MED ORDER — BECLOMETHASONE DIPROP HFA 80 MCG/ACT IN AERB: INHALATION_SPRAY | RESPIRATORY_TRACT | 1 refills | Status: DC

## 2016-11-28 ENCOUNTER — Encounter: Payer: Self-pay | Admitting: Physician Assistant

## 2016-11-28 ENCOUNTER — Ambulatory Visit (INDEPENDENT_AMBULATORY_CARE_PROVIDER_SITE_OTHER): Payer: BLUE CROSS/BLUE SHIELD | Admitting: Physician Assistant

## 2016-11-28 VITALS — BP 148/84 | HR 93 | Temp 98.9°F | Resp 16 | Ht 71.0 in | Wt 261.0 lb

## 2016-11-28 DIAGNOSIS — J069 Acute upper respiratory infection, unspecified: Secondary | ICD-10-CM

## 2016-11-28 DIAGNOSIS — B9789 Other viral agents as the cause of diseases classified elsewhere: Secondary | ICD-10-CM | POA: Diagnosis not present

## 2016-11-28 DIAGNOSIS — J4531 Mild persistent asthma with (acute) exacerbation: Secondary | ICD-10-CM | POA: Diagnosis not present

## 2016-11-28 MED ORDER — BENZONATATE 100 MG PO CAPS: 100.0000 mg | ORAL_CAPSULE | Freq: Two times a day (BID) | ORAL | 0 refills | Status: DC | PRN

## 2016-11-28 MED ORDER — PREDNISONE 20 MG PO TABS: 40.0000 mg | ORAL_TABLET | Freq: Every day | ORAL | 0 refills | Status: DC

## 2016-11-28 MED ORDER — IPRATROPIUM-ALBUTEROL 0.5-2.5 (3) MG/3ML IN SOLN
3.0000 mL | Freq: Once | RESPIRATORY_TRACT | Status: AC
  Administered 2016-11-28: 3 mL via RESPIRATORY_TRACT

## 2016-11-28 NOTE — Progress Notes (Signed)
Pre visit review using our clinic review tool, if applicable. No additional management support is needed unless otherwise documented below in the visit note. 

## 2016-11-28 NOTE — Patient Instructions (Signed)
Please stay well-hydrated.  Mucinex-DM for cough and congestion. This will help more for chest congestion than the Sudafed. Continue Zyrtec. I have sent in a script for Tessalon to also help with cough.  Continue Qvar as directed. Start prednisone burst to help with wheezing and sinus inflammation.   I do not feel antibiotic is needed since symptoms are improving.  Call me if they do not continue to improve/resolve or you note new symptoms.  Follow-up with ENT as scheduled.

## 2016-11-28 NOTE — Addendum Note (Signed)
Addended by: Brunetta Jeans on: 11/28/2016 12:23 PM   Modules accepted: Orders

## 2016-11-28 NOTE — Progress Notes (Signed)
Patient presents to clinic today c/o 4 days of chest congestion and cough that is productive of sputum. Also noted increase in wheeze despite use of Qvar. Denies fever or chills. Denies chest pain. Recently finished course of three medications for chronic sinusitis. Denies any sinus pain, facial pain or tooth pain. Has been taking Zyrtec and Sudafed for symptoms. Has follow-up with ENT next week.  Past Medical History:  Diagnosis Date  . Allergy   . Asthma   . GERD (gastroesophageal reflux disease)   . History of hiatal hernia   . Pneumonia    5 -7 yrs ago    Current Outpatient Prescriptions on File Prior to Visit  Medication Sig Dispense Refill  . albuterol (PROAIR HFA) 108 (90 Base) MCG/ACT inhaler INHALE 2 PUFFS EVERY 6 HOURS AS NEEDED FOR WHEEZING OR SHORTNESS OF BREATH. 1 Inhaler 2  . Ascorbic Acid (VITAMIN C) 1000 MG tablet Take 1,000 mg by mouth daily.    . beclomethasone (QVAR REDIHALER) 80 MCG/ACT inhaler TAKE 1 PUFF BY MOUTH TWICE A DAY 3 Inhaler 1  . cetirizine (ZYRTEC) 10 MG tablet Take 10 mg by mouth daily as needed for allergies.    . fluticasone (FLONASE) 50 MCG/ACT nasal spray Place 2 sprays into both nostrils daily. 16 g 3  . hyoscyamine (LEVBID) 0.375 MG 12 hr tablet Take 0.375 mg by mouth daily as needed for cramping.   11  . lansoprazole (PREVACID) 15 MG capsule Take 15 mg by mouth daily.     . Multiple Vitamin (MULTIVITAMIN WITH MINERALS) TABS tablet Take 1 tablet by mouth daily.    . Multiple Vitamins-Minerals (ZINC PO) Take 1 tablet by mouth daily.    . Omega-3 Fatty Acids (FISH OIL) 1000 MG CAPS Take 1,000 mg by mouth daily.    Marland Kitchen PAZEO 0.7 % SOLN Place 1 drop into the right eye daily as needed for allergies.     No current facility-administered medications on file prior to visit.     Allergies  Allergen Reactions  . No Known Allergies     Family History  Problem Relation Age of Onset  . Cancer Father   . Diabetes Unknown        maternal family     Social History   Social History  . Marital status: Married    Spouse name: N/A  . Number of children: N/A  . Years of education: N/A   Social History Main Topics  . Smoking status: Never Smoker  . Smokeless tobacco: Never Used  . Alcohol use Yes     Comment: rarely  . Drug use: No  . Sexual activity: Not Asked   Other Topics Concern  . None   Social History Narrative  . None   Review of Systems - See HPI.  All other ROS are negative.  BP (!) 148/84   Pulse 93   Temp 98.9 F (37.2 C) (Oral)   Resp 16   Ht 5\' 11"  (1.803 m)   Wt 261 lb (118.4 kg)   SpO2 97%   BMI 36.40 kg/m   Physical Exam  Constitutional: He is oriented to person, place, and time and well-developed, well-nourished, and in no distress.  HENT:  Head: Normocephalic and atraumatic.  Right Ear: Tympanic membrane normal.  Left Ear: A middle ear effusion (serous) is present.  Nose: Mucosal edema and rhinorrhea present. Right sinus exhibits no maxillary sinus tenderness and no frontal sinus tenderness. Left sinus exhibits no maxillary sinus tenderness and  no frontal sinus tenderness.  Mouth/Throat: Uvula is midline, oropharynx is clear and moist and mucous membranes are normal.  Eyes: Conjunctivae are normal.  Neck: Neck supple.  Cardiovascular: Normal rate, regular rhythm, normal heart sounds and intact distal pulses.   Pulmonary/Chest: Effort normal. No respiratory distress. He has wheezes. He has no rales. He exhibits no tenderness.  Neurological: He is alert and oriented to person, place, and time.  Skin: Skin is warm and dry. No rash noted.  Psychiatric: Affect normal.  Vitals reviewed.   Assessment/Plan: 1. Viral URI with cough Improving symptoms per patient. Will continue OTC medications. Supportive measures discussed. Rx Tessalon. Prednisone as noted below.  2. Mild persistent asthma with exacerbation Duoneb x 1 given in office with improvement in wheeze. Vitals stable. Lungs otherwise  CTAB. Will start 5-day burst of prednisone.   Leeanne Rio, PA-C

## 2016-12-05 ENCOUNTER — Ambulatory Visit
Admission: RE | Admit: 2016-12-05 | Discharge: 2016-12-05 | Disposition: A | Discharge status: Home / Self Care | Payer: BLUE CROSS/BLUE SHIELD | Source: Ambulatory Visit | Attending: Otolaryngology | Admitting: Otolaryngology

## 2016-12-05 ENCOUNTER — Other Ambulatory Visit: Payer: Self-pay | Admitting: Otolaryngology

## 2016-12-05 DIAGNOSIS — R059 Cough, unspecified: Secondary | ICD-10-CM

## 2016-12-05 DIAGNOSIS — R05 Cough: Secondary | ICD-10-CM

## 2016-12-05 DIAGNOSIS — J181 Lobar pneumonia, unspecified organism: Secondary | ICD-10-CM

## 2016-12-05 DIAGNOSIS — R062 Wheezing: Secondary | ICD-10-CM

## 2017-01-12 ENCOUNTER — Other Ambulatory Visit: Payer: Self-pay | Admitting: Emergency Medicine

## 2017-01-12 MED ORDER — BECLOMETHASONE DIPROP HFA 80 MCG/ACT IN AERB: INHALATION_SPRAY | RESPIRATORY_TRACT | 5 refills | Status: DC

## 2017-02-10 ENCOUNTER — Other Ambulatory Visit: Payer: Self-pay | Admitting: General Practice

## 2017-02-10 MED ORDER — ALBUTEROL SULFATE HFA 108 (90 BASE) MCG/ACT IN AERS: INHALATION_SPRAY | RESPIRATORY_TRACT | 0 refills | Status: DC

## 2017-03-20 ENCOUNTER — Encounter: Payer: Self-pay | Admitting: General Practice

## 2017-05-01 ENCOUNTER — Ambulatory Visit: Payer: BLUE CROSS/BLUE SHIELD | Admitting: Family Medicine

## 2017-05-01 ENCOUNTER — Encounter: Payer: Self-pay | Admitting: Family Medicine

## 2017-05-01 VITALS — BP 140/88 | HR 93 | Temp 97.7°F | Ht 71.0 in | Wt 263.8 lb

## 2017-05-01 DIAGNOSIS — J209 Acute bronchitis, unspecified: Secondary | ICD-10-CM | POA: Diagnosis not present

## 2017-05-01 DIAGNOSIS — J4521 Mild intermittent asthma with (acute) exacerbation: Secondary | ICD-10-CM

## 2017-05-01 MED ORDER — HYDROCODONE-HOMATROPINE 5-1.5 MG/5ML PO SYRP: 5.0000 mL | ORAL_SOLUTION | ORAL | 0 refills | Status: DC | PRN

## 2017-05-01 MED ORDER — AMOXICILLIN-POT CLAVULANATE 875-125 MG PO TABS: 1.0000 | ORAL_TABLET | Freq: Two times a day (BID) | ORAL | 0 refills | Status: DC

## 2017-05-01 MED ORDER — METHYLPREDNISOLONE ACETATE 80 MG/ML IJ SUSP
160.0000 mg | Freq: Once | INTRAMUSCULAR | Status: AC
  Administered 2017-05-01: 160 mg via INTRAMUSCULAR

## 2017-05-01 NOTE — Progress Notes (Signed)
   Subjective:    Patient ID: Thomas [name], male    DOB: [birth year], 57 y.o.   MRN: [id number]  HPI Here for 3 weeks of chest tightness, wheezing, and coughing up yellow sputum. No fever. Using his QVar bid and Proair as needed. On Mucinex DM.   Review of Systems  Constitutional: Negative.   HENT: Positive for congestion, postnasal drip, sinus pressure and sore throat. Negative for sinus pain.   Eyes: Negative.   Respiratory: Positive for cough, chest tightness and wheezing.        Objective:   Physical Exam  Constitutional: He appears well-developed and well-nourished.  HENT:  Right Ear: External ear normal.  Left Ear: External ear normal.  Nose: Nose normal.  Mouth/Throat: Oropharynx is clear and moist.  Eyes: Conjunctivae are normal.  Neck: Neck supple. No thyromegaly present.  Cardiovascular: Normal rate, regular rhythm, normal heart sounds and intact distal pulses.  Pulmonary/Chest: Effort normal. He has no rales.  Scattered wheezes and rhonchi   Lymphadenopathy:    He has no cervical adenopathy.          Assessment & Plan:  Bronchitis with exacerbation of asthma. Given a steroid shot. Use Augmentin for 10 days.  Alysia Penna, MD

## 2017-05-01 NOTE — Addendum Note (Signed)
Addended by: Myriam Forehand on: 05/01/2017 12:07 PM   Modules accepted: Orders

## 2017-05-13 ENCOUNTER — Other Ambulatory Visit: Payer: Self-pay | Admitting: Family Medicine

## 2017-05-24 ENCOUNTER — Other Ambulatory Visit: Payer: Self-pay | Admitting: Otolaryngology

## 2017-06-19 ENCOUNTER — Ambulatory Visit: Payer: BLUE CROSS/BLUE SHIELD | Admitting: Family Medicine

## 2017-06-19 ENCOUNTER — Encounter: Payer: Self-pay | Admitting: Family Medicine

## 2017-06-19 ENCOUNTER — Encounter: Payer: BLUE CROSS/BLUE SHIELD | Admitting: Family Medicine

## 2017-06-19 VITALS — BP 126/80 | HR 86 | Temp 98.7°F | Wt 263.0 lb

## 2017-06-19 DIAGNOSIS — R5383 Other fatigue: Secondary | ICD-10-CM

## 2017-06-19 DIAGNOSIS — J4521 Mild intermittent asthma with (acute) exacerbation: Secondary | ICD-10-CM | POA: Diagnosis not present

## 2017-06-19 NOTE — Progress Notes (Signed)
   Subjective:    Patient ID: Thomas [name], male    DOB: 12-18-1960, 57 y.o.   MRN: [id number]  HPI Here to follow up a bronchitis and to ask about fatigue. He was here for the bronchitis about 7 weeks ago and this cleared up with antibiotics. His asthma has improved a lot as well, and now he is using his rescue inhaler only about twice a week. He had surgery for nasal polyps a few weeks ago and this went well. He does complain about a lot of fatigue however over the past few weeks.    Review of Systems  Constitutional: Positive for fatigue.  Respiratory: Negative.   Cardiovascular: Negative.   Gastrointestinal: Negative.   Genitourinary: Negative.   Neurological: Negative.        Objective:   Physical Exam  Constitutional: He is oriented to person, place, and time. He appears well-developed and well-nourished.  Neck: No thyromegaly present.  Cardiovascular: Normal rate, regular rhythm, normal heart sounds and intact distal pulses.  Pulmonary/Chest: Effort normal and breath sounds normal. No stridor. No respiratory distress. He has no wheezes. He has no rales.  Lymphadenopathy:    He has no cervical adenopathy.  Neurological: He is alert and oriented to person, place, and time.          Assessment & Plan:  He has recovered from bronchitis and his asthma is stable. As for the fatigue, we will screen with some lab tests today.  Alysia Penna, MD

## 2017-06-20 ENCOUNTER — Encounter: Payer: Self-pay | Admitting: Family Medicine

## 2017-06-20 ENCOUNTER — Other Ambulatory Visit: Payer: Self-pay

## 2017-06-20 LAB — HEPATIC FUNCTION PANEL
ALBUMIN: 4.1 g/dL (ref 3.5–5.2)
ALK PHOS: 114 U/L (ref 39–117)
ALT: 29 U/L (ref 0–53)
AST: 17 U/L (ref 0–37)
BILIRUBIN DIRECT: 0.1 mg/dL (ref 0.0–0.3)
Total Bilirubin: 0.5 mg/dL (ref 0.2–1.2)
Total Protein: 6.9 g/dL (ref 6.0–8.3)

## 2017-06-20 LAB — CBC WITH DIFFERENTIAL/PLATELET
BASOS ABS: 0.1 10*3/uL (ref 0.0–0.1)
BASOS PCT: 1 % (ref 0.0–3.0)
EOS ABS: 0 10*3/uL (ref 0.0–0.7)
Eosinophils Relative: 0.5 % (ref 0.0–5.0)
HEMATOCRIT: 40.3 % (ref 39.0–52.0)
Hemoglobin: 14.1 g/dL (ref 13.0–17.0)
LYMPHS ABS: 1.3 10*3/uL (ref 0.7–4.0)
LYMPHS PCT: 19.3 % (ref 12.0–46.0)
MCHC: 35 g/dL (ref 30.0–36.0)
MCV: 91.8 fl (ref 78.0–100.0)
MONO ABS: 0.3 10*3/uL (ref 0.1–1.0)
Monocytes Relative: 5 % (ref 3.0–12.0)
NEUTROS ABS: 5 10*3/uL (ref 1.4–7.7)
NEUTROS PCT: 74.2 % (ref 43.0–77.0)
PLATELETS: 316 10*3/uL (ref 150.0–400.0)
RBC: 4.39 Mil/uL (ref 4.22–5.81)
RDW: 12.9 % (ref 11.5–15.5)
WBC: 6.8 10*3/uL (ref 4.0–10.5)

## 2017-06-20 LAB — TSH: TSH: 0.41 u[IU]/mL (ref 0.35–4.50)

## 2017-06-20 LAB — BASIC METABOLIC PANEL
BUN: 15 mg/dL (ref 6–23)
CHLORIDE: 102 meq/L (ref 96–112)
CO2: 24 mEq/L (ref 19–32)
Calcium: 9.6 mg/dL (ref 8.4–10.5)
Creatinine, Ser: 0.97 mg/dL (ref 0.40–1.50)
GFR: 101.98 mL/min (ref >60.00)
GLUCOSE: 330 mg/dL — AB (ref 70–99)
Potassium: 4.6 mEq/L (ref 3.5–5.1)
SODIUM: 135 meq/L (ref 135–145)

## 2017-06-20 LAB — VITAMIN B12: VITAMIN B 12: 579 pg/mL (ref 211–911)

## 2017-06-20 MED ORDER — METFORMIN HCL 500 MG PO TABS: 500.0000 mg | ORAL_TABLET | Freq: Two times a day (BID) | ORAL | 2 refills | Status: DC

## 2017-06-20 NOTE — Telephone Encounter (Signed)
Call in Metformin 500 mg bid, #60 with 2 rf. Reduce his intake of carbs and sugars. See me back in 2 weeks  Pt advised Rx has been sent

## 2017-07-07 ENCOUNTER — Ambulatory Visit: Payer: BLUE CROSS/BLUE SHIELD | Admitting: Family Medicine

## 2017-07-07 ENCOUNTER — Encounter: Payer: Self-pay | Admitting: Family Medicine

## 2017-07-07 VITALS — BP 126/90 | HR 93 | Temp 98.4°F | Ht 71.0 in | Wt 257.9 lb

## 2017-07-07 DIAGNOSIS — E119 Type 2 diabetes mellitus without complications: Secondary | ICD-10-CM | POA: Diagnosis not present

## 2017-07-07 DIAGNOSIS — E669 Obesity, unspecified: Secondary | ICD-10-CM | POA: Diagnosis not present

## 2017-07-07 MED ORDER — KETOCONAZOLE 2 % EX CREA: 1.0000 "application " | TOPICAL_CREAM | Freq: Two times a day (BID) | CUTANEOUS | 2 refills | Status: DC | PRN

## 2017-07-07 NOTE — Progress Notes (Signed)
   Subjective:    Patient ID: Thomas [name], male    DOB: 11-11-1960, 57 y.o.   MRN: [id number]  HPI Here to follow up on newly diagnosed type 2 diabetes. A recent random glucose was 330. He admits to a poor diet and not exercising. He has been taking Metformin for 2 weeks and has mad some lifestyle changes. He feels better.    Review of Systems  Constitutional: Negative.   Respiratory: Negative.   Cardiovascular: Negative.   Endocrine: Negative.   Neurological: Negative.        Objective:   Physical Exam  Constitutional: He is oriented to person, place, and time. He appears well-developed and well-nourished.  Cardiovascular: Normal rate, regular rhythm, normal heart sounds and intact distal pulses.  Pulmonary/Chest: Effort normal and breath sounds normal.  Neurological: He is alert and oriented to person, place, and time.          Assessment & Plan:  Type 2 diabetes. We will get a baseline A1c today. [facility]er to the [facility], at his request.  Alysia Penna, MD

## 2017-07-07 NOTE — Addendum Note (Signed)
Addended by: Tomi Likens on: 07/07/2017 11:24 AM   Modules accepted: Orders

## 2017-07-08 LAB — HEMOGLOBIN A1C
HEMOGLOBIN A1C: 8.6 %{Hb} — AB (ref <5.7)
Mean Plasma Glucose: 200 (calc)
eAG (mmol/L): 11.1 (calc)

## 2017-07-21 ENCOUNTER — Ambulatory Visit: Payer: Self-pay

## 2017-07-21 ENCOUNTER — Telehealth: Payer: Self-pay | Admitting: Family Medicine

## 2017-07-21 MED ORDER — AMOXICILLIN-POT CLAVULANATE 875-125 MG PO TABS: 1.0000 | ORAL_TABLET | Freq: Two times a day (BID) | ORAL | 0 refills | Status: DC

## 2017-07-21 MED ORDER — HYDROCODONE-HOMATROPINE 5-1.5 MG/5ML PO SYRP: 5.0000 mL | ORAL_SOLUTION | ORAL | 0 refills | Status: DC | PRN

## 2017-07-21 NOTE — Telephone Encounter (Signed)
See Triage note 

## 2017-07-21 NOTE — Telephone Encounter (Signed)
Pt. called in this AM and left message that he would like an antibiotic and cough medication ordered.  Phone call to pt. To discuss his symptoms.  Stated he has this "same stuff" he gets a couple times/ year.  C/o onset of coughing and intermittent wheeze about 3 days ago.  Stated the cough is worse when laying down at night.   Reported he is coughing up a "yellowish-brown phlegm."  Stated his cough is keeping him awake at night.    Unsure about having fever, but stated he awakened, during night, in sweat.  Denied any sinus /nasal congestion.  Questioned about shortness of breath and stated "I have problems taking in air, and have to take short breaths to get enough air." Reported using his inhaler, but doesn't feel that it helps that much.  Recommended that he schedule an appt. for evaluation. The pt. declined an appt. 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  He is requesting that his symptoms be relayed to Dr. Sarajane Jews, and ask if he will order an antibiotic and cough medication today.  Reported he has to be in [place], [place] at 8:00 AM, and is not able to come to Saturday clinic.  Care advice given per protocol.  Pt. Verb. Understanding.  Stated he will not be back in town before 7-7:30 PM today, and it would be difficult to get to UC.  Stated he hates to go through weekend with these symptoms without treatment.   Will send triage note to Dr. Sarajane Jews for further recommendations.  Pt. Agrees with plan.      Reason for Disposition . [1] Continuous (nonstop) coughing interferes with work or school AND [2] no improvement using cough treatment per Care Advice  Answer Assessment - Initial Assessment Questions 1. ONSET: "When did the cough begin?"      3 days ago 2. SEVERITY: "How bad is the cough today?"      Intermittent cough through the day; worse at night with laying down at night 3. RESPIRATORY DISTRESS: "Describe your breathing."      Intermittent wheeze;  4. FEVER: "Do  you have a fever?" If so, ask: "What is your temperature, how was it measured, and when did it start?"     Wakening with sweating  5. SPUTUM: "Describe the color of your sputum" (clear, white, yellow, green)    Yellowish- brown 6. HEMOPTYSIS: "Are you coughing up any blood?" If so ask: "How much?" (flecks, streaks, tablespoons, etc.)     No  7. CARDIAC HISTORY: "Do you have any history of heart disease?" (e.g., heart attack, congestive heart failure)      *No Answer* 8. LUNG HISTORY: "Do you have any history of lung disease?"  (e.g., pulmonary embolus, asthma, emphysema)     Hx of asthma 9. PE RISK FACTORS: "Do you have a history of blood clots?" (or: recent major surgery, recent prolonged travel, bedridden )     *No Answer* 10. OTHER SYMPTOMS: "Do you have any other symptoms?" (e.g., runny nose, wheezing, chest pain)       Wheezing, denied chest pressure or tightness; "problems with taking in air; taking short breaths to get enough air".   Denied nasal congestion   Hx of sinusitis and has procedure for this 4/24.  Protocols used: COUGH - ACUTE PRODUCTIVE-A-AH

## 2017-07-21 NOTE — Telephone Encounter (Signed)
Copied from [place] [id number]. Topic: Quick Communication - Rx Refill/Question >> Jul 21, 2017  9:25 AM Boyd Kerbs wrote: Medication:  Pt got the junk back - he gets several times a yr. (bronchitis and wheezing)    Asking for antibiotic and cough syrup to help him sleep  Told him he may need to have appt. - he said Dr. Franco Nones of this and he has appt on 7/1  Has the patient contacted their pharmacy? No. (Agent: If no, request that the patient contact the pharmacy for the refill.) (Agent: If yes, when and what did the pharmacy advise?)  Preferred Pharmacy (with phone number or street name):  [id number] [name], [place] [address] Phone: [phone] Fax: [phone]    Agent: Please be advised that RX refills may take up to 3 business days. We ask that you follow-up with your pharmacy.

## 2017-07-21 NOTE — Telephone Encounter (Signed)
[facility]

## 2017-07-21 NOTE — Telephone Encounter (Signed)
Called and spoke with pt. Pt advised and voiced understanding.  

## 2017-07-21 NOTE — Telephone Encounter (Signed)
Please advise 

## 2017-07-21 NOTE — Addendum Note (Signed)
Addended by: Alysia Penna A on: 07/21/2017 04:13 PM   Modules accepted: Orders

## 2017-07-31 ENCOUNTER — Ambulatory Visit (INDEPENDENT_AMBULATORY_CARE_PROVIDER_SITE_OTHER): Payer: BLUE CROSS/BLUE SHIELD | Admitting: Family Medicine

## 2017-07-31 ENCOUNTER — Encounter: Payer: Self-pay | Admitting: Family Medicine

## 2017-07-31 VITALS — BP 118/76 | HR 85 | Temp 98.6°F | Ht 70.5 in | Wt 248.2 lb

## 2017-07-31 DIAGNOSIS — B9689 Other specified bacterial agents as the cause of diseases classified elsewhere: Secondary | ICD-10-CM | POA: Diagnosis not present

## 2017-07-31 DIAGNOSIS — E119 Type 2 diabetes mellitus without complications: Secondary | ICD-10-CM

## 2017-07-31 DIAGNOSIS — Z0000 Encounter for general adult medical examination without abnormal findings: Secondary | ICD-10-CM

## 2017-07-31 DIAGNOSIS — J019 Acute sinusitis, unspecified: Secondary | ICD-10-CM | POA: Diagnosis not present

## 2017-07-31 LAB — CBC WITH DIFFERENTIAL/PLATELET
BASOS ABS: 0.2 10*3/uL — AB (ref 0.0–0.1)
Basophils Relative: 2.9 % (ref 0.0–3.0)
Eosinophils Absolute: 1.3 10*3/uL — ABNORMAL HIGH (ref 0.0–0.7)
Eosinophils Relative: 19.6 % — ABNORMAL HIGH (ref 0.0–5.0)
HCT: 43.5 % (ref 39.0–52.0)
Hemoglobin: 15.2 g/dL (ref 13.0–17.0)
LYMPHS ABS: 2 10*3/uL (ref 0.7–4.0)
Lymphocytes Relative: 29.7 % (ref 12.0–46.0)
MCHC: 34.9 g/dL (ref 30.0–36.0)
MCV: 91.1 fl (ref 78.0–100.0)
MONOS PCT: 6.5 % (ref 3.0–12.0)
Monocytes Absolute: 0.4 10*3/uL (ref 0.1–1.0)
NEUTROS PCT: 41.3 % — AB (ref 43.0–77.0)
Neutro Abs: 2.7 10*3/uL (ref 1.4–7.7)
Platelets: 345 10*3/uL (ref 150.0–400.0)
RBC: 4.78 Mil/uL (ref 4.22–5.81)
RDW: 12.9 % (ref 11.5–15.5)
WBC: 6.6 10*3/uL (ref 4.0–10.5)

## 2017-07-31 LAB — BASIC METABOLIC PANEL
BUN: 11 mg/dL (ref 6–23)
CHLORIDE: 104 meq/L (ref 96–112)
CO2: 28 mEq/L (ref 19–32)
CREATININE: 1.02 mg/dL (ref 0.40–1.50)
Calcium: 9.8 mg/dL (ref 8.4–10.5)
GFR: 96.2 mL/min (ref >60.00)
Glucose, Bld: 126 mg/dL — ABNORMAL HIGH (ref 70–99)
Potassium: 4.2 mEq/L (ref 3.5–5.1)
Sodium: 141 mEq/L (ref 135–145)

## 2017-07-31 LAB — POC URINALSYSI DIPSTICK (AUTOMATED)
Blood, UA: NEGATIVE
Glucose, UA: POSITIVE — AB
LEUKOCYTES UA: NEGATIVE
Nitrite, UA: NEGATIVE
PH UA: 6 (ref 5.0–8.0)
Protein, UA: POSITIVE — AB
Spec Grav, UA: >=1.03 — AB (ref 1.010–1.025)
Urobilinogen, UA: 0.2 E.U./dL

## 2017-07-31 LAB — LIPID PANEL
CHOL/HDL RATIO: 5
CHOLESTEROL: 183 mg/dL (ref 0–200)
HDL: 33.9 mg/dL — AB (ref >39.00)
LDL Cholesterol: 129 mg/dL — ABNORMAL HIGH (ref 0–99)
NonHDL: 149.16
TRIGLYCERIDES: 102 mg/dL (ref 0.0–149.0)
VLDL: 20.4 mg/dL (ref 0.0–40.0)

## 2017-07-31 LAB — HEPATIC FUNCTION PANEL
ALT: 29 U/L (ref 0–53)
AST: 20 U/L (ref 0–37)
Albumin: 4.6 g/dL (ref 3.5–5.2)
Alkaline Phosphatase: 101 U/L (ref 39–117)
BILIRUBIN TOTAL: 0.6 mg/dL (ref 0.2–1.2)
Bilirubin, Direct: 0.1 mg/dL (ref 0.0–0.3)
Total Protein: 7.1 g/dL (ref 6.0–8.3)

## 2017-07-31 LAB — GLUCOSE, POCT (MANUAL RESULT ENTRY): POC GLUCOSE: 126 mg/dL — AB (ref 70–99)

## 2017-07-31 LAB — TSH: TSH: 1.04 u[IU]/mL (ref 0.35–4.50)

## 2017-07-31 LAB — PSA: PSA: 0.86 ng/mL (ref 0.10–4.00)

## 2017-07-31 MED ORDER — FLUTICASONE PROPIONATE 50 MCG/ACT NA SUSP: 2.0000 | Freq: Every day | NASAL | 3 refills | Status: DC

## 2017-07-31 MED ORDER — ALBUTEROL SULFATE HFA 108 (90 BASE) MCG/ACT IN AERS: INHALATION_SPRAY | RESPIRATORY_TRACT | 3 refills | Status: DC

## 2017-07-31 MED ORDER — BECLOMETHASONE DIPROP HFA 80 MCG/ACT IN AERB: INHALATION_SPRAY | RESPIRATORY_TRACT | 3 refills | Status: DC

## 2017-07-31 MED ORDER — BECLOMETHASONE DIPROP HFA 80 MCG/ACT IN AERB: INHALATION_SPRAY | RESPIRATORY_TRACT | 11 refills | Status: DC

## 2017-07-31 MED ORDER — FLUTICASONE PROPIONATE 50 MCG/ACT NA SUSP: 2.0000 | Freq: Every day | NASAL | 11 refills | Status: DC

## 2017-07-31 MED ORDER — CLARITHROMYCIN 500 MG PO TABS: 500.0000 mg | ORAL_TABLET | Freq: Two times a day (BID) | ORAL | 0 refills | Status: DC

## 2017-07-31 NOTE — Progress Notes (Signed)
   Subjective:    Patient ID: Thomas [name], male    DOB: 1960/06/07, 57 y.o.   MRN: [id number]  HPI Here for a well exam. His only concern is a continued cough that started 2 months ago. He took a course of Augmentin and he improved, but the cough never went away. It produces clear or yellow sputum. No fever. Using Mucinex. He is working diligently on his diet to bring the diabetes under control.    Review of Systems  Constitutional: Negative.   HENT: Negative.   Eyes: Negative.   Respiratory: Positive for cough.   Cardiovascular: Negative.   Gastrointestinal: Negative.   Genitourinary: Negative.   Musculoskeletal: Negative.   Skin: Negative.   Neurological: Negative.   Psychiatric/Behavioral: Negative.        Objective:   Physical Exam  Constitutional: He is oriented to person, place, and time. He appears well-developed and well-nourished. No distress.  HENT:  Head: Normocephalic and atraumatic.  Right Ear: External ear normal.  Left Ear: External ear normal.  Nose: Nose normal.  Mouth/Throat: Oropharynx is clear and moist. No oropharyngeal exudate.  Eyes: Pupils are equal, round, and reactive to light. Conjunctivae and EOM are normal. Right eye exhibits no discharge. Left eye exhibits no discharge. No scleral icterus.  Neck: Neck supple. No JVD present. No tracheal deviation present. No thyromegaly present.  Cardiovascular: Normal rate, regular rhythm, normal heart sounds and intact distal pulses. Exam reveals no gallop and no friction rub.  No murmur heard. Pulmonary/Chest: Effort normal and breath sounds normal. No respiratory distress. He has no wheezes. He has no rales. He exhibits no tenderness.  Abdominal: Soft. Bowel sounds are normal. He exhibits no distension and no mass. There is no tenderness. There is no rebound and no guarding.  Genitourinary: Rectum normal, prostate normal and penis normal. Rectal exam shows guaiac negative stool. No penile tenderness.    Musculoskeletal: Normal range of motion. He exhibits no edema or tenderness.  Lymphadenopathy:    He has no cervical adenopathy.  Neurological: He is alert and oriented to person, place, and time. He has normal reflexes. He displays normal reflexes. No cranial nerve deficit. He exhibits normal muscle tone. Coordination normal.  Skin: Skin is warm and dry. No rash noted. He is not diaphoretic. No erythema. No pallor.  Psychiatric: He has a normal mood and affect. His behavior is normal. Judgment and thought content normal.          Assessment & Plan:  Well exam. We discussed diet and exercise. Get fasting labs. He seems to have a residual bronchitis so we will treat this with Biaxin. Alysia Penna, MD

## 2017-08-11 ENCOUNTER — Ambulatory Visit: Payer: Self-pay

## 2017-08-11 NOTE — Telephone Encounter (Signed)
Please advise 

## 2017-08-11 NOTE — Telephone Encounter (Signed)
Incoming call from patient stating he finished the antibiotic course and does not feel any better.  Sputum is a "lemon/lime" color. Has a" raw fleshy taste". States the the second round of antibiotics did not work.  Appetite has decreased.Fells warm to the touch but denies fever.  Was in [place] last week.  Will be out of town this up coming week. Thus he made an appointment to be seen tomorrow  (Sat) at 3:00 pm with Dr[facility]Green at [facility]. Wanted to update   Dr. Sarajane Jews of his status.  Answer Assessment - Initial Assessment Questions 1. ONSET: "When did the cough begin?"      On going for amonth 2. SEVERITY: "How bad is the cough today?"      sever 3. RESPIRATORY DISTRESS: "Describe your breathing."      Using inhaler 6 times per day only helps a little while 4. FEVER: "Do you have a fever?" If so, ask: "What is your temperature, how was it measured, and when did it start?"      " dont  think so.  5. SPUTUM: "Describe the color of your sputum" (clear, white, yellow, green)     6. HEMOPTYSIS: "Are you coughing up any blood?" If so ask: "How much?" (flecks, streaks, tablespoons, etc.)    no 7. CARDIAC HISTORY: "Do you have any history of heart disease?" (e.g., heart attack, congestive heart failure)      no 8. LUNG HISTORY: "Do you have any history of lung disease?"  (e.g., pulmonary embolus, asthma, emphysema)      Been treating for asthma 9. PE RISK FACTORS: "Do you have a history of blood clots?" (or: recent major surgery, recent prolonged travel, bedridden)     *No Answer* 10. OTHER SYMPTOMS: "Do you have any other symptoms?" (e.g., runny nose, wheezing, chest pain)       Wheezing  Protocols used: COUGH - ACUTE PRODUCTIVE-A-AH

## 2017-08-12 ENCOUNTER — Encounter: Payer: Self-pay | Admitting: Family Medicine

## 2017-08-12 ENCOUNTER — Ambulatory Visit (INDEPENDENT_AMBULATORY_CARE_PROVIDER_SITE_OTHER): Payer: BLUE CROSS/BLUE SHIELD

## 2017-08-12 ENCOUNTER — Ambulatory Visit: Payer: BLUE CROSS/BLUE SHIELD | Admitting: Family Medicine

## 2017-08-12 ENCOUNTER — Other Ambulatory Visit: Payer: Self-pay

## 2017-08-12 VITALS — BP 132/80 | HR 77 | Temp 98.4°F | Ht 70.5 in | Wt 252.6 lb

## 2017-08-12 DIAGNOSIS — R05 Cough: Secondary | ICD-10-CM

## 2017-08-12 DIAGNOSIS — J45901 Unspecified asthma with (acute) exacerbation: Secondary | ICD-10-CM

## 2017-08-12 DIAGNOSIS — R062 Wheezing: Secondary | ICD-10-CM | POA: Diagnosis not present

## 2017-08-12 DIAGNOSIS — R059 Cough, unspecified: Secondary | ICD-10-CM

## 2017-08-12 MED ORDER — PREDNISONE 20 MG PO TABS: 40.0000 mg | ORAL_TABLET | Freq: Every day | ORAL | 0 refills | Status: DC

## 2017-08-12 MED ORDER — IPRATROPIUM BROMIDE 0.02 % IN SOLN
0.5000 mg | Freq: Once | RESPIRATORY_TRACT | Status: AC
  Administered 2017-08-12: 0.5 mg via RESPIRATORY_TRACT

## 2017-08-12 MED ORDER — ALBUTEROL SULFATE (2.5 MG/3ML) 0.083% IN NEBU
2.5000 mg | INHALATION_SOLUTION | Freq: Once | RESPIRATORY_TRACT | Status: AC
  Administered 2017-08-12: 2.5 mg via RESPIRATORY_TRACT

## 2017-08-12 NOTE — Patient Instructions (Addendum)
Cough appears to be due to asthma flare or wheezing.  That can sometimes be related to heartburn, so continue your Prevacid, but appears to be more lung related at this time.   I would recommend prednisone 2 pills/day for the next 5 days which should significantly help your situation.  Okay to continue the Qvar at same dose and albuterol 2 puffs up to every 4 hours until wheezing improves, then as needed up to every 4-6 hours.  Recheck here or with primary provider in next 3-4 days if not improved, sooner if any worsening symptoms including fever. please return for recheck.  I would recommend checking your blood sugar once or twice per day while you are taking prednisone as that can elevate blood sugar, especially when patients with diabetes.  If that level is over 250, call your doctor for instructions or follow-up here.  Thank you for coming in today.   Return to the clinic or go to the nearest emergency room if any of your symptoms worsen or new symptoms occur.   Bronchospasm, Adult Bronchospasm is a tightening of the airways going into the lungs. During an episode, it may be harder to breathe. You may cough, and you may make a whistling sound when you breathe (wheeze). This condition often affects people with asthma. What are the causes? This condition is caused by swelling and irritation in the airways. It can be triggered by:  An infection (common).  Seasonal allergies.  An allergic reaction.  Exercise.  Irritants. These include pollution, cigarette smoke, strong odors, aerosol sprays, and paint fumes.  Weather changes. Winds increase molds and pollens in the air. Cold air may cause swelling.  Stress and emotional upset.  What are the signs or symptoms? Symptoms of this condition include:  Wheezing. If the episode was triggered by an allergy, wheezing may start right away or hours later.  Nighttime coughing.  Frequent or severe coughing with a simple cold.  Chest  tightness.  Shortness of breath.  Decreased ability to exercise.  How is this diagnosed? This condition is usually diagnosed with a review of your medical history and a physical exam. Tests, such as lung function tests, are sometimes done to look for other conditions. The need for a chest X-ray depends on where the wheezing occurs and whether it is the first time you have wheezed. How is this treated? This condition may be treated with:  Inhaled medicines. These open up the airways and help you breathe. They can be taken with an inhaler or a nebulizer device.  Corticosteroid medicines. These may be given for severe bronchospasm, usually when it is associated with asthma.  Avoiding triggers, such as irritants, infection, or allergies.  Follow these instructions at home: Medicines  Take over-the-counter and prescription medicines only as told by your health care provider.  If you need to use an inhaler or nebulizer to take your medicine, ask your health care provider to explain how to use it correctly. If you were given a spacer, always use it with your inhaler. Lifestyle  Reduce the number of triggers in your home. To do this: ? Change your heating and air conditioning filter at least once a month. ? Limit your use of fireplaces and wood stoves. ? Do not smoke. Do not allow smoking in your home. ? Avoid using perfumes and fragrances. ? Get rid of pests, such as roaches and mice, and their droppings. ? Remove any mold from your home. ? Keep your house clean and  dust free. Use unscented cleaning products. ? Replace carpet with wood, tile, or vinyl flooring. Carpet can trap dander and dust. ? Use allergy-proof pillows, mattress covers, and box spring covers. ? Wash bed sheets and blankets every week in hot water. Dry them in a dryer. ? Use blankets that are made of polyester or cotton. ? Wash your hands often. ? Do not allow pets in your bedroom.  Avoid breathing in cold air when  you exercise. General instructions  Have a plan for seeking medical care. Know when to call your health care provider and local emergency services, and where to get emergency care.  Stay up to date on your immunizations.  When you have an episode of bronchospasm, stay calm. Try to relax and breathe more slowly.  If you have asthma, make sure you have an asthma action plan.  Keep all follow-up visits as told by your health care provider. This is important. Contact a health care provider if:  You have muscle aches.  You have chest pain.  The mucus that you cough up (sputum) changes from clear or white to yellow, green, gray, or bloody.  You have a fever.  Your sputum gets thicker. Get help right away if:  Your wheezing and coughing get worse, even after you take your prescribed medicines.  It gets even harder to breathe.  You develop severe chest pain. Summary  Bronchospasm is a tightening of the airways going into the lungs.  During an episode of bronchospasm, you may have a harder time breathing. You may cough and make a whistling sound when you breathe (wheeze).  Avoid exposure to triggers such as smoke, dust, mold, animal dander, and fragrances.  When you have an episode of bronchospasm, stay calm. Try to relax and breathe more slowly. This information is not intended to replace advice given to you by your health care provider. Make sure you discuss any questions you have with your health care provider. Document Released: 01/20/2003 Document Revised: 01/14/2016 Document Reviewed: 01/14/2016 Elsevier Interactive Patient Education  2017 [employer].     IF you received an x-ray today, you will receive an invoice from [facility]. Please contact [facility] at [phone] with questions or concerns regarding your invoice.   IF you received labwork today, you will receive an invoice from [facility]. Please contact [facility] at [phone] with  questions or concerns regarding your invoice.   Our billing staff will not be able to assist you with questions regarding bills from these companies.  You will be contacted with the lab results as soon as they are available. The fastest way to get your results is to activate your My Chart account. Instructions are located on the last page of this paperwork. If you have not heard from [name] regarding the results in 2 weeks, please contact this office.

## 2017-08-12 NOTE — Progress Notes (Signed)
Subjective:  By signing my name below, I, [name], attest that this documentation has been prepared under the direction and in the presence of Merri Ray, MD. Electronically Signed: [name], Earl. 08/12/2017 , 4:08 PM .  Patient was seen in Room 13 .   Patient ID: Thomas [name], male    DOB: 1960/10/02, 57 y.o.   MRN: [id number] Chief Complaint  Patient presents with  . Chest Congestion    wheezing, cough with flem. Going on a month    HPI Thomas [name] is a 57 y.o. male  Here for chest congestion with wheezing and cough that started about a month ago. He has a history of diabetes, heartburn and reactive airway disease. He takes Qvar 80 mcg bid prescribed by Dr. Sarajane Jews.   Patient states he's been unable to sleep with phlegm in his throat, worse when he lays down at night. He's taken 2 regimes of antibiotics, prescribed by Dr. Sarajane Jews. He describes discolored phlegm as mostly foamy white, with some green-lime tint. He was last seen by Dr. Sarajane Jews on July 1st, and was treated with Biaxin at that time. Prior to that, he was treated with a course of Augmentin. He had reported some improvement with Augmentin, and minimal improvement on Biaxin.   He mentions asthma was recently diagnosed, about 2 years ago. His last chest xray was in Nov 2018. He takes Qvar every day, 1 puff in the morning and 1 puff at night. He's been using his albuterol inhaler a lot more recently due to phlegm and congestion, about 4-5 times a day. He hasn't been on prednisone recently. He denies any chest pain. He's taking metformin for diabetes; hasn't been checking his blood sugars at home.   Patient Active Problem List   Diagnosis Date Noted  . Type 2 diabetes mellitus without complications ([place]) 41/93/7902  . Chronic sinusitis 08/30/2016  . Nasal polyposis 08/30/2016  . Herniated cervical disc without myelopathy 07/14/2016  . Elevated BP 07/27/2015  . Meralgia paresthetica of right side 07/13/2015  .  Reactive airway disease with wheezing 07/13/2015  . Obesity (BMI 30-39.9) 09/19/2012  . GERD 09/09/2009  . ABDOMINAL PAIN 09/09/2009   Past Medical History:  Diagnosis Date  . Allergy   . Asthma   . GERD (gastroesophageal reflux disease)   . History of hiatal hernia   . Pneumonia    5 -7 yrs ago   Past Surgical History:  Procedure Laterality Date  . ANTERIOR CERVICAL DECOMP/DISCECTOMY FUSION N/A 07/14/2016   Procedure: Cervical five-six5- Cervical six-seven Anterior cervical decompression/discectomy/fusion;  Surgeon: Erline Levine, MD;  Location: [place];  Service: Neurosurgery;  Laterality: N/A;  . COLON SURGERY    . COLONOSCOPY  12/22/2014   per Dr. Earlean Shawl, clear, repeat in 5 yrs (hx of adenomas)   . DG THUMB LEFT HAND    . DG THUMB RIGHT HAND ([place] [facility])    . PARTIAL COLECTOMY     sigmoid, Dr. Earlean Shawl   Allergies  Allergen Reactions  . No Known Allergies    Prior to Admission medications   Medication Sig Start Date End Date Taking? Authorizing Provider  albuterol (PROAIR HFA) 108 (90 Base) MCG/ACT inhaler INHALE 2 PUFFS EVERY 6 HOURS AS NEEDED FOR WHEEZING OR SHORTNESS OF BREATH. 07/31/17  Yes Laurey Morale, MD  beclomethasone (QVAR REDIHALER) 80 MCG/ACT inhaler TAKE 1 PUFF BY MOUTH TWICE A DAY 07/31/17  Yes Laurey Morale, MD  fluticasone (FLONASE) 50 MCG/ACT nasal spray Place 2  sprays into both nostrils daily. 07/31/17  Yes Laurey Morale, MD  HYDROcodone-homatropine (HYDROMET) 5-1.5 MG/5ML syrup Take 5 mLs by mouth every 4 (four) hours as needed. 07/21/17  Yes Laurey Morale, MD  hyoscyamine (LEVBID) 0.375 MG 12 hr tablet Take 0.375 mg by mouth daily as needed for cramping.  08/25/15  Yes [provider]  ketoconazole (NIZORAL) 2 % cream Apply 1 application topically 2 (two) times daily as needed for irritation. 07/07/17  Yes Laurey Morale, MD  lansoprazole (PREVACID) 15 MG capsule Take 15 mg by mouth daily.    Yes [provider]  metFORMIN (GLUCOPHAGE) 500 MG tablet  Take 1 tablet (500 mg total) by mouth 2 (two) times daily with a meal. 06/20/17  Yes Laurey Morale, MD   Social History   Socioeconomic History  . Marital status: Married    Spouse name: Not on file  . Number of children: Not on file  . Years of education: Not on file  . Highest education level: Not on file  Occupational History  . Not on file  Social Needs  . Financial resource strain: Not on file  . Food insecurity:    Worry: Not on file    Inability: Not on file  . Transportation needs:    Medical: Not on file    Non-medical: Not on file  Tobacco Use  . Smoking status: Never Smoker  . Smokeless tobacco: Never Used  Substance and Sexual Activity  . Alcohol use: Yes    Comment: rarely  . Drug use: No  . Sexual activity: Not on file  Lifestyle  . Physical activity:    Days per week: Not on file    Minutes per session: Not on file  . Stress: Not on file  Relationships  . Social connections:    Talks on phone: Not on file    Gets together: Not on file    Attends religious service: Not on file    Active member of club or organization: Not on file    Attends meetings of clubs or organizations: Not on file    Relationship status: Not on file  . Intimate partner violence:    Fear of current or ex partner: Not on file    Emotionally abused: Not on file    Physically abused: Not on file    Forced sexual activity: Not on file  Other Topics Concern  . Not on file  Social History Narrative  . Not on file   Review of Systems  Constitutional: Negative for fatigue and unexpected weight change.  HENT: Positive for congestion.   Eyes: Negative for visual disturbance.  Respiratory: Positive for cough and wheezing. Negative for chest tightness and shortness of breath.   Cardiovascular: Negative for chest pain, palpitations and leg swelling.  Gastrointestinal: Negative for abdominal pain and blood in stool.  Neurological: Negative for dizziness, light-headedness and headaches.         Objective:   Physical Exam  Constitutional: He is oriented to person, place, and time. He appears well-developed and well-nourished. No distress.  HENT:  Head: Normocephalic and atraumatic.  Eyes: Pupils are equal, round, and reactive to light. EOM are normal.  Neck: Neck supple.  Cardiovascular: Normal rate.  Pulmonary/Chest: Effort normal. No respiratory distress. He has wheezes (diffuse, expiratory).  Diffuse expiratory wheeze with coarse breath sounds bilaterally  Musculoskeletal: Normal range of motion.  Neurological: He is alert and oriented to person, place, and time.  Skin: Skin  is warm and dry.  Psychiatric: He has a normal mood and affect. His behavior is normal.  Nursing note and vitals reviewed.   Vitals:   08/12/17 1509 08/12/17 1512  BP: (!) 140/94 132/80  Pulse: 77   Temp: 98.4 F (36.9 C)   TempSrc: Oral   SpO2: 98%   Weight: 252 lb 9.6 oz (114.6 kg)   Height: 5' 10.5" (1.791 m)        Assessment & Plan:    Thomas [name] is a 57 y.o. male Cough - Plan: albuterol (PROVENTIL) (2.5 MG/3ML) 0.083% nebulizer solution 2.5 mg, ipratropium (ATROVENT) nebulizer solution 0.5 mg, DG Chest 2 View, predniSONE (DELTASONE) 20 MG tablet  Wheezing - Plan: albuterol (PROVENTIL) (2.5 MG/3ML) 0.083% nebulizer solution 2.5 mg, ipratropium (ATROVENT) nebulizer solution 0.5 mg, DG Chest 2 View, predniSONE (DELTASONE) 20 MG tablet  Asthmatic bronchitis with acute exacerbation, unspecified asthma severity, unspecified whether persistent - Plan: predniSONE (DELTASONE) 20 MG tablet  Suspected asthmatic bronchitis.  No concerning findings on chest x-ray, decided to hold on further antibiotics.  Did have some improvement in wheezing after albuterol and Atrovent neb with some persistent end expiratory wheeze.  -Start prednisone 40 mg daily x5 days, potential side effects and risk discussed  -Continue albuterol inhaler 2 puffs every 4 hours as needed until prednisone kicks in,  then as needed.  - ER/RTC precautions  Meds ordered this encounter  Medications  . albuterol (PROVENTIL) (2.5 MG/3ML) 0.083% nebulizer solution 2.5 mg  . ipratropium (ATROVENT) nebulizer solution 0.5 mg  . predniSONE (DELTASONE) 20 MG tablet    Sig: Take 2 tablets (40 mg total) by mouth daily with breakfast.    Dispense:  10 tablet    Refill:  0   Patient Instructions   Cough appears to be due to asthma flare or wheezing.  That can sometimes be related to heartburn, so continue your Prevacid, but appears to be more lung related at this time.   I would recommend prednisone 2 pills/day for the next 5 days which should significantly help your situation.  Okay to continue the Qvar at same dose and albuterol 2 puffs up to every 4 hours until wheezing improves, then as needed up to every 4-6 hours.  Recheck here or with primary provider in next 3-4 days if not improved, sooner if any worsening symptoms including fever. please return for recheck.  I would recommend checking your blood sugar once or twice per day while you are taking prednisone as that can elevate blood sugar, especially when patients with diabetes.  If that level is over 250, call your doctor for instructions or follow-up here.  Thank you for coming in today.   Return to the clinic or go to the nearest emergency room if any of your symptoms worsen or new symptoms occur.   Bronchospasm, Adult Bronchospasm is a tightening of the airways going into the lungs. During an episode, it may be harder to breathe. You may cough, and you may make a whistling sound when you breathe (wheeze). This condition often affects people with asthma. What are the causes? This condition is caused by swelling and irritation in the airways. It can be triggered by:  An infection (common).  Seasonal allergies.  An allergic reaction.  Exercise.  Irritants. These include pollution, cigarette smoke, strong odors, aerosol sprays, and paint  fumes.  Weather changes. Winds increase molds and pollens in the air. Cold air may cause swelling.  Stress and emotional upset.  What  are the signs or symptoms? Symptoms of this condition include:  Wheezing. If the episode was triggered by an allergy, wheezing may start right away or hours later.  Nighttime coughing.  Frequent or severe coughing with a simple cold.  Chest tightness.  Shortness of breath.  Decreased ability to exercise.  How is this diagnosed? This condition is usually diagnosed with a review of your medical history and a physical exam. Tests, such as lung function tests, are sometimes done to look for other conditions. The need for a chest X-ray depends on where the wheezing occurs and whether it is the first time you have wheezed. How is this treated? This condition may be treated with:  Inhaled medicines. These open up the airways and help you breathe. They can be taken with an inhaler or a nebulizer device.  Corticosteroid medicines. These may be given for severe bronchospasm, usually when it is associated with asthma.  Avoiding triggers, such as irritants, infection, or allergies.  Follow these instructions at home: Medicines  Take over-the-counter and prescription medicines only as told by your health care provider.  If you need to use an inhaler or nebulizer to take your medicine, ask your health care provider to explain how to use it correctly. If you were given a spacer, always use it with your inhaler. Lifestyle  Reduce the number of triggers in your home. To do this: ? Change your heating and air conditioning filter at least once a month. ? Limit your use of fireplaces and wood stoves. ? Do not smoke. Do not allow smoking in your home. ? Avoid using perfumes and fragrances. ? Get rid of pests, such as roaches and mice, and their droppings. ? Remove any mold from your home. ? Keep your house clean and dust free. Use unscented cleaning  products. ? Replace carpet with wood, tile, or vinyl flooring. Carpet can trap dander and dust. ? Use allergy-proof pillows, mattress covers, and box spring covers. ? Wash bed sheets and blankets every week in hot water. Dry them in a dryer. ? Use blankets that are made of polyester or cotton. ? Wash your hands often. ? Do not allow pets in your bedroom.  Avoid breathing in cold air when you exercise. General instructions  Have a plan for seeking medical care. Know when to call your health care provider and local emergency services, and where to get emergency care.  Stay up to date on your immunizations.  When you have an episode of bronchospasm, stay calm. Try to relax and breathe more slowly.  If you have asthma, make sure you have an asthma action plan.  Keep all follow-up visits as told by your health care provider. This is important. Contact a health care provider if:  You have muscle aches.  You have chest pain.  The mucus that you cough up (sputum) changes from clear or white to yellow, green, gray, or bloody.  You have a fever.  Your sputum gets thicker. Get help right away if:  Your wheezing and coughing get worse, even after you take your prescribed medicines.  It gets even harder to breathe.  You develop severe chest pain. Summary  Bronchospasm is a tightening of the airways going into the lungs.  During an episode of bronchospasm, you may have a harder time breathing. You may cough and make a whistling sound when you breathe (wheeze).  Avoid exposure to triggers such as smoke, dust, mold, animal dander, and fragrances.  When you have an  episode of bronchospasm, stay calm. Try to relax and breathe more slowly. This information is not intended to replace advice given to you by your health care provider. Make sure you discuss any questions you have with your health care provider. Document Released: 01/20/2003 Document Revised: 01/14/2016 Document Reviewed:  01/14/2016 Elsevier Interactive Patient Education  2017 [employer].     IF you received an x-ray today, you will receive an invoice from [facility]. Please contact [facility] at [phone] with questions or concerns regarding your invoice.   IF you received labwork today, you will receive an invoice from [facility]. Please contact [facility] at [phone] with questions or concerns regarding your invoice.   Our billing staff will not be able to assist you with questions regarding bills from these companies.  You will be contacted with the lab results as soon as they are available. The fastest way to get your results is to activate your My Chart account. Instructions are located on the last page of this paperwork. If you have not heard from [name] regarding the results in 2 weeks, please contact this office.       I personally performed the services described in this documentation, which was scribed in my presence. The recorded information has been reviewed and considered for accuracy and completeness, addended by me as needed, and agree with information above.  Signed,   Merri Ray, MD [facility] [place].  08/13/17 11:11 AM

## 2017-08-14 NOTE — Telephone Encounter (Signed)
I see he went to urgent care

## 2017-09-05 ENCOUNTER — Encounter: Payer: Self-pay | Admitting: Family Medicine

## 2017-09-08 NOTE — Telephone Encounter (Signed)
It is best to follow up with [name]. Have him make an OV so we can evaluate him

## 2017-09-11 ENCOUNTER — Encounter: Payer: Self-pay | Admitting: Family Medicine

## 2017-09-11 ENCOUNTER — Ambulatory Visit: Payer: BLUE CROSS/BLUE SHIELD | Admitting: Family Medicine

## 2017-09-11 VITALS — BP 138/70 | HR 95 | Temp 98.3°F | Wt 254.8 lb

## 2017-09-11 DIAGNOSIS — J4521 Mild intermittent asthma with (acute) exacerbation: Secondary | ICD-10-CM

## 2017-09-11 MED ORDER — ALBUTEROL SULFATE (2.5 MG/3ML) 0.083% IN NEBU: 2.5000 mg | INHALATION_SOLUTION | RESPIRATORY_TRACT | 5 refills | Status: DC | PRN

## 2017-09-11 NOTE — Progress Notes (Signed)
   Subjective:    Patient ID: Thomas [name], male    DOB: [birth year], 57 y.o.   MRN: [id number]  HPI Here to discuss asthma. He has had 2 acute flares in the past month, and he is not sure as to why. Each time he was able to take a few days of Prednisone and get over it (he has a bottle of 20 mg Prednisone at home). He admits to using QVar only sporadically.    Review of Systems  Constitutional: Negative.   Respiratory: Positive for cough, shortness of breath and wheezing.   Cardiovascular: Negative.   Neurological: Negative.        Objective:   Physical Exam  Constitutional: He is oriented to person, place, and time. He appears well-developed and well-nourished.  Cardiovascular: Normal rate.  Pulmonary/Chest: Effort normal and breath sounds normal. No stridor. No respiratory distress. He has no wheezes. He has no rales. He exhibits no tenderness.  Neurological: He is alert and oriented to person, place, and time.          Assessment & Plan:  Asthma. He needs to focus more on prevention and I urged him to use the QVar every day no matter how he feels. We will also write for him to have a nebulizer at home for prn use. Alysia Penna, MD

## 2017-09-12 ENCOUNTER — Ambulatory Visit: Payer: BLUE CROSS/BLUE SHIELD | Admitting: Family Medicine

## 2017-09-12 ENCOUNTER — Encounter

## 2017-09-13 ENCOUNTER — Other Ambulatory Visit: Payer: Self-pay | Admitting: Family Medicine

## 2017-09-13 NOTE — Telephone Encounter (Signed)
Per Dr. Sarajane Jews, pt can have a 30 day supply.  Is due for A1C check Sept 2019.

## 2017-09-27 ENCOUNTER — Encounter: Payer: Self-pay | Admitting: Family Medicine

## 2017-09-27 NOTE — Telephone Encounter (Signed)
It's almost time for a refill of the metformin. However, the metformin causes me to have stomach aches, diarrhea, and fatigue. Is there another pill to try such as Januvia, with less side effects?  Thanks,  Thomas [name]   Dr. Sarajane Jews please advise. Thanks

## 2017-09-27 NOTE — Telephone Encounter (Signed)
Tell him to stop the Metformin and let's get another A1c in the next week or two. Then I can make a decision about meds

## 2017-10-03 ENCOUNTER — Encounter (INDEPENDENT_AMBULATORY_CARE_PROVIDER_SITE_OTHER): Payer: BLUE CROSS/BLUE SHIELD

## 2017-10-09 ENCOUNTER — Ambulatory Visit: Payer: BLUE CROSS/BLUE SHIELD | Admitting: Family Medicine

## 2017-10-09 ENCOUNTER — Encounter: Payer: Self-pay | Admitting: Family Medicine

## 2017-10-09 VITALS — BP 120/70 | HR 80 | Temp 98.7°F | Ht 70.5 in | Wt 252.6 lb

## 2017-10-09 DIAGNOSIS — E119 Type 2 diabetes mellitus without complications: Secondary | ICD-10-CM | POA: Diagnosis not present

## 2017-10-09 NOTE — Progress Notes (Signed)
   Subjective:    Patient ID: Thomas [name], male    DOB: 23-Jan-1961, 57 y.o.   MRN: [id number]  HPI Here to follow up on diabetes. He feels great. He has made a lot of dietary changes and he is off Metformin. His A1c today is down to 6.4.   Review of Systems  Constitutional: Negative.   Respiratory: Negative.   Cardiovascular: Negative.   Neurological: Negative.        Objective:   Physical Exam  Constitutional: He is oriented to person, place, and time. He appears well-developed and well-nourished.  Cardiovascular: Normal rate, regular rhythm, normal heart sounds and intact distal pulses.  Pulmonary/Chest: Effort normal and breath sounds normal.  Musculoskeletal: He exhibits no edema.  Neurological: He is alert and oriented to person, place, and time.          Assessment & Plan:  His diabetes is now well controlled. Recheck an A1c in 6 months. He has a meeting with Nutrition coming up soon. Alysia Penna, MD

## 2017-10-13 ENCOUNTER — Ambulatory Visit: Payer: Self-pay | Admitting: Family Medicine

## 2017-10-13 NOTE — Telephone Encounter (Signed)
Dr. Sarajane Jews, please advise request for steroids + nighttime cough medicine.

## 2017-10-13 NOTE — Telephone Encounter (Signed)
Yes I can send these in but I need a pharmacy to send them to

## 2017-10-13 NOTE — Telephone Encounter (Signed)
Pt callled in for medications for his asthma see (CRM).  Triaged for disposition.  Pt states he is on the road and has had a flare up of asthma symptoms. Pt has a cough runny nose and wheezing. He has albuterol inhaler with him which he is using per instructions with some relief. He also says he will be in town later tonight to pick up his medications. Pt refused appointment tomorrow at the [facility] Saturday clinic. He will seek urgent care or ED if symptom worsen.  Care advice given.  Reason for Disposition . [1] MILD asthma attack (e.g., no SOB at rest, mild SOB with walking, speaks normally in sentences, mild wheezing) AND [2]  persists > 24 hours on appropriate treatment  Answer Assessment - Initial Assessment Questions 1. RESPIRATORY STATUS: "Describe your breathing?" (e.g., wheezing, shortness of breath, unable to speak, severe coughing)      Wheezing cough 2. ONSET: "When did this asthma attack begin?"      Wednesday night 3. TRIGGER: "What do you think triggered this attack?" (e.g., URI, exposure to pollen or other allergen, tobacco smoke)      pollen 4. PEAK EXPIRATORY FLOW RATE (PEFR): "Do you use a peak flow meter?" If so, ask: "What's the current peak flow? What's your personal best peak flow?"      no 5. SEVERITY: "How bad is this attack?"    - MILD: No SOB at rest, mild SOB with walking, speaks normally in sentences, can lay down, no retractions, pulse < 100. (GREEN Zone: PEFR 80-100%)   - MODERATE: SOB at rest, SOB with minimal exertion and prefers to sit, cannot lie down flat, speaks in phrases, mild retractions, audible wheezing, pulse 100-120. (YELLOW Zone: PEFR 50-80%)    - SEVERE: Very SOB at rest, speaks in single words, struggling to breathe, sitting hunched forward, retractions, usually loud wheezing, sometimes minimal wheezing because of decreased air movement, pulse > 120. (RED Zone: PEFR < 50%).      severe 6. MEDICATIONS (Inhaler or nebs): "What are your asthma medications?"  and "What treatments have you given so far?"    - Quick-relief: albuterol, metaproterenol, salbutamol, or other inhaled or nebulized beta-agonist medicines   - Long-term-control: steroids, cromolyn, or other anti-inflammatory medicines.     albuterol 7. OTHER SYMPTOMS: "Do you have any other symptoms? (e.g., runny nose, chest pain, fever)     Runny nose 8. PREGNANCY: "Is there any chance you are pregnant?" "When was your last menstrual period?"     N/A  Protocols used: ASTHMA ATTACK-A-AH

## 2017-10-13 NOTE — Telephone Encounter (Unsigned)
Copied from [place] [id number]. Topic: General - Other >> Oct 13, 2017 10:32 AM Keene Breath wrote: Reason for CRM: Patient called to request that the doctor call in a prescription for his Prednisone for his asthma.  Patient stated that the doctor told him to call if he has a flare up so he could get the medication as soon as possible.  Patient also stated that he needed the cough medicine to control his coughing at night so he could sleep.  CB# [phone]

## 2017-10-16 ENCOUNTER — Encounter (INDEPENDENT_AMBULATORY_CARE_PROVIDER_SITE_OTHER): Payer: Self-pay

## 2017-10-16 ENCOUNTER — Ambulatory Visit (INDEPENDENT_AMBULATORY_CARE_PROVIDER_SITE_OTHER): Payer: BLUE CROSS/BLUE SHIELD | Admitting: Bariatrics

## 2017-10-16 DIAGNOSIS — Z0289 Encounter for other administrative examinations: Secondary | ICD-10-CM

## 2017-10-17 MED ORDER — METHYLPREDNISOLONE 4 MG PO TBPK: ORAL_TABLET | ORAL | 0 refills | Status: DC

## 2017-10-17 MED ORDER — HYDROCODONE-HOMATROPINE 5-1.5 MG/5ML PO SYRP: 5.0000 mL | ORAL_SOLUTION | ORAL | 0 refills | Status: DC | PRN

## 2017-10-17 NOTE — Telephone Encounter (Signed)
Thomas [name] 10/17/2017 12:27 PM  Per triage note [name] would like to know pt's preferred pharmacy.   Pt would like to have medication sent to pharmacy on file.    Pharmacy:  [id number] [name], [place] [phone] (Phone) [phone] (Fax)

## 2017-10-17 NOTE — Telephone Encounter (Signed)
Need to know pt's preferred pharmacy.   LMTCB

## 2017-10-17 NOTE — Telephone Encounter (Signed)
These were sent in  

## 2017-10-23 ENCOUNTER — Encounter (INDEPENDENT_AMBULATORY_CARE_PROVIDER_SITE_OTHER): Payer: Self-pay | Admitting: Family Medicine

## 2017-10-23 ENCOUNTER — Ambulatory Visit (INDEPENDENT_AMBULATORY_CARE_PROVIDER_SITE_OTHER): Payer: BLUE CROSS/BLUE SHIELD | Admitting: Family Medicine

## 2017-10-23 VITALS — BP 130/74 | HR 74 | Temp 98.1°F | Ht 70.0 in | Wt 245.0 lb

## 2017-10-23 DIAGNOSIS — R5383 Other fatigue: Secondary | ICD-10-CM

## 2017-10-23 DIAGNOSIS — J4540 Moderate persistent asthma, uncomplicated: Secondary | ICD-10-CM

## 2017-10-23 DIAGNOSIS — E119 Type 2 diabetes mellitus without complications: Secondary | ICD-10-CM | POA: Diagnosis not present

## 2017-10-23 DIAGNOSIS — Z6835 Body mass index (BMI) 35.0-35.9, adult: Secondary | ICD-10-CM

## 2017-10-23 DIAGNOSIS — Z9189 Other specified personal risk factors, not elsewhere classified: Secondary | ICD-10-CM | POA: Diagnosis not present

## 2017-10-23 DIAGNOSIS — Z1331 Encounter for screening for depression: Secondary | ICD-10-CM

## 2017-10-24 LAB — LIPID PANEL WITH LDL/HDL RATIO
Cholesterol, Total: 143 mg/dL (ref 100–199)
HDL: 36 mg/dL — AB (ref >39)
LDL Calculated: 94 mg/dL (ref 0–99)
LDL/HDL RATIO: 2.6 ratio (ref 0.0–3.6)
TRIGLYCERIDES: 64 mg/dL (ref 0–149)
VLDL Cholesterol Cal: 13 mg/dL (ref 5–40)

## 2017-10-24 LAB — CBC WITH DIFFERENTIAL
BASOS: 1 %
Basophils Absolute: 0 10*3/uL (ref 0.0–0.2)
EOS (ABSOLUTE): 0.5 10*3/uL — AB (ref 0.0–0.4)
Eos: 6 %
HEMATOCRIT: 42.6 % (ref 37.5–51.0)
Hemoglobin: 16 g/dL (ref 13.0–17.7)
Immature Grans (Abs): 0 10*3/uL (ref 0.0–0.1)
Immature Granulocytes: 1 %
Lymphocytes Absolute: 2.2 10*3/uL (ref 0.7–3.1)
Lymphs: 26 %
MCH: 33 pg (ref 26.6–33.0)
MCHC: 37.6 g/dL — AB (ref 31.5–35.7)
MCV: 88 fL (ref 79–97)
MONOS ABS: 0.6 10*3/uL (ref 0.1–0.9)
Monocytes: 8 %
NEUTROS ABS: 4.9 10*3/uL (ref 1.4–7.0)
Neutrophils: 58 %
RBC: 4.85 x10E6/uL (ref 4.14–5.80)
RDW: 13.4 % (ref 12.3–15.4)
WBC: 8.2 10*3/uL (ref 3.4–10.8)

## 2017-10-24 LAB — COMPREHENSIVE METABOLIC PANEL
A/G RATIO: 1.9 (ref 1.2–2.2)
ALT: 29 IU/L (ref 0–44)
AST: 18 IU/L (ref 0–40)
Albumin: 4.2 g/dL (ref 3.5–5.5)
Alkaline Phosphatase: 110 IU/L (ref 39–117)
BUN/Creatinine Ratio: 12 (ref 9–20)
BUN: 12 mg/dL (ref 6–24)
Bilirubin Total: 0.6 mg/dL (ref 0.0–1.2)
CALCIUM: 9.5 mg/dL (ref 8.7–10.2)
CO2: 24 mmol/L (ref 20–29)
Chloride: 99 mmol/L (ref 96–106)
Creatinine, Ser: 1 mg/dL (ref 0.76–1.27)
GFR, EST AFRICAN AMERICAN: 95 mL/min/{1.73_m2} (ref >59)
GFR, EST NON AFRICAN AMERICAN: 82 mL/min/{1.73_m2} (ref >59)
Globulin, Total: 2.2 g/dL (ref 1.5–4.5)
Glucose: 114 mg/dL — ABNORMAL HIGH (ref 65–99)
POTASSIUM: 4.4 mmol/L (ref 3.5–5.2)
SODIUM: 139 mmol/L (ref 134–144)
TOTAL PROTEIN: 6.4 g/dL (ref 6.0–8.5)

## 2017-10-24 LAB — T4, FREE: Free T4: 1.39 ng/dL (ref 0.82–1.77)

## 2017-10-24 LAB — HEMOGLOBIN A1C
ESTIMATED AVERAGE GLUCOSE: 140 mg/dL
Hgb A1c MFr Bld: 6.5 % — ABNORMAL HIGH (ref 4.8–5.6)

## 2017-10-24 LAB — T3: T3, Total: 123 ng/dL (ref 71–180)

## 2017-10-24 LAB — MICROALBUMIN / CREATININE URINE RATIO
Creatinine, Urine: 175.3 mg/dL
Microalb/Creat Ratio: <1.7 mg/g creat (ref 0.0–30.0)

## 2017-10-24 LAB — VITAMIN B12: Vitamin B-12: 691 pg/mL (ref 232–1245)

## 2017-10-24 LAB — TSH: TSH: 0.695 u[IU]/mL (ref 0.450–4.500)

## 2017-10-24 LAB — INSULIN, RANDOM: INSULIN: 14.1 u[IU]/mL (ref 2.6–24.9)

## 2017-10-24 LAB — FOLATE: Folate: 19.8 ng/mL (ref >3.0)

## 2017-10-24 LAB — VITAMIN D 25 HYDROXY (VIT D DEFICIENCY, FRACTURES): VIT D 25 HYDROXY: 38.4 ng/mL (ref 30.0–100.0)

## 2017-10-24 NOTE — Progress Notes (Signed)
Office: [phone]  /  Fax: [phone]   Dear Dr. Sarajane [name],   Thank you for referring Thomas [name] to our clinic. The following note includes my evaluation and treatment recommendations.  HPI:   Chief Complaint: OBESITY    Thomas [name] has been referred by Thomas [name]. Thomas Jews, MD for consultation regarding his obesity and obesity related comorbidities.    Thomas [name] (MR# [id number]) is a 57 y.o. male who presents on 10/24/2017 for obesity evaluation and treatment. Current BMI is Body mass index is 35.15 kg/m.Thomas [name] has been struggling with his weight for many years and has been unsuccessful in either losing weight, maintaining weight loss, or reaching his healthy weight goal.     Thomas [name] attended our information session and states he is currently in the action stage of change and ready to dedicate time achieving and maintaining a healthier weight. Thomas [name] is interested in becoming our patient and working on intensive lifestyle modifications including (but not limited to) diet, exercise and weight loss.    Thomas [name] states his family eats meals together he thinks his family will eat healthier with  him his desired weight loss is 23 lbs he started gaining weight after job change his heaviest weight ever was 267 lbs.   Fatigue Thomas [name] feels his energy is lower than it should be. This has worsened with weight gain and has not worsened recently. Thomas [name] admits to daytime somnolence and admits to waking up still tired. Patient is at risk for obstructive sleep apnea. Patent has a history of symptoms of daytime fatigue and morning fatigue. Patient generally gets 6 to 8 hours of sleep per night, and states they generally have restful sleep. Snoring is present. Apneic episodes are not present. Epworth Sleepiness Score is 6  Moderate Persistent Asthma Thomas [name] has a diagnosis of moderate persistent asthma and he was on steroids recently. Thomas [name] is now on Qvar and Albuterol and he is doing  better.  Diabetes II Thomas [name] has a diagnosis of diabetes type II. Thomas [name] states fasting BGs range between 99 and 123 and he denies any hypoglycemic episodes. Thomas [name] is no longer on metformin and he is attempting to work on intensive lifestyle modifications including diet, exercise, and weight loss to help control his blood glucose levels.   At risk for cardiovascular disease Thomas [name] is at a higher than average risk for cardiovascular disease due to obesity and diabetes. He currently denies any chest pain.  Depression Screen Thomas [name]'s Food and Mood (modified PHQ-9) score was  Depression screen PHQ 2/9 10/23/2017  Decreased Interest 1  Down, Depressed, Hopeless 0  PHQ - 2 Score 1  Altered sleeping 1  Tired, decreased energy 1  Change in appetite 0  Feeling bad or failure about yourself  0  Trouble concentrating 0  Moving slowly or fidgety/restless 0  Suicidal thoughts 0  PHQ-9 Score 3  Difficult doing work/chores Not difficult at all    ALLERGIES: Allergies  Allergen Reactions  . No Known Allergies     MEDICATIONS: Current Outpatient Medications on File Prior to Visit  Medication Sig Dispense Refill  . albuterol (PROAIR HFA) 108 (90 Base) MCG/ACT inhaler INHALE 2 PUFFS EVERY 6 HOURS AS NEEDED FOR WHEEZING OR SHORTNESS OF BREATH. 3 Inhaler 3  . albuterol (PROVENTIL) (2.5 MG/3ML) 0.083% nebulizer solution Take 3 mLs (2.5 mg total) by nebulization every 4 (four) hours as needed for wheezing or shortness of breath. 75 mL 5  . beclomethasone (QVAR REDIHALER) 80 MCG/ACT inhaler TAKE 1 PUFF  BY MOUTH TWICE A DAY 3 Inhaler 3  . fluticasone (FLONASE) 50 MCG/ACT nasal spray Place 2 sprays into both nostrils daily. 48 g 3  . HYDROcodone-homatropine (HYDROMET) 5-1.5 MG/5ML syrup Take 5 mLs by mouth every 4 (four) hours as needed. 240 mL 0  . hyoscyamine (LEVBID) 0.375 MG 12 hr tablet Take 0.375 mg by mouth daily as needed for cramping.   11  . lansoprazole (PREVACID) 15 MG capsule Take 15  mg by mouth daily.     Marland Kitchen tiZANidine (ZANAFLEX) 4 MG capsule Take 4 mg by mouth 3 (three) times daily as needed for muscle spasms.     No current facility-administered medications on file prior to visit.     PAST MEDICAL HISTORY: Past Medical History:  Diagnosis Date  . Allergy   . Asthma   . Back pain   . Diabetes ([place])   . Dyspnea   . GERD (gastroesophageal reflux disease)   . History of hiatal hernia   . Pneumonia    5 -7 yrs ago  . Swallowing difficulty     PAST SURGICAL HISTORY: Past Surgical History:  Procedure Laterality Date  . ANTERIOR CERVICAL DECOMP/DISCECTOMY FUSION N/A 07/14/2016   Procedure: Cervical five-six5- Cervical six-seven Anterior cervical decompression/discectomy/fusion;  Surgeon: Erline Levine, MD;  Location: [facility];  Service: Neurosurgery;  Laterality: N/A;  . COLON SURGERY    . COLONOSCOPY  12/22/2014   per Dr. Earlean Shawl, clear, repeat in 5 yrs (hx of adenomas)   . DG THUMB LEFT HAND    . DG THUMB RIGHT HAND ([place] HX)    . FUNCTIONAL ENDOSCOPIC SINUS SURGERY    . PARTIAL COLECTOMY     sigmoid, Dr. Earlean Shawl    SOCIAL HISTORY: Social History   Tobacco Use  . Smoking status: Former Smoker    Packs/day: 1.00    Years: 10.00    Pack years: 10.00    Types: Cigarettes    Last attempt to quit: 01/31/1986    Years since quitting: 31.7  . Smokeless tobacco: Never Used  Substance Use Topics  . Alcohol use: Yes    Comment: rarely  . Drug use: No    FAMILY HISTORY: Family History  Problem Relation Age of Onset  . Cancer Mother   . Cancer Father   . Hyperlipidemia Father   . Diabetes Unknown        maternal family    ROS: Review of Systems  Constitutional: Positive for malaise/fatigue.  HENT: Positive for congestion (nasal stuffiness) and sinus pain.        + Nasal Discharge + Hay Fever + Hoarseness + Difficult or Painful Swallowing  Eyes:       + Wear Glasses or Contacts + Floaters  Respiratory: Positive for cough and wheezing.        +  Painful or Difficulty Breathing  Cardiovascular: Negative for chest pain.       + Sudden Awakening from Sleep with Shortness of Breath + Leg Cramping  Gastrointestinal: Positive for heartburn and nausea.       + Swallowing Difficulty  Musculoskeletal: Positive for neck pain.       + Neck Stiffness  Endo/Heme/Allergies:       Negative for hypoglycemia    PHYSICAL EXAM: Blood pressure 130/74, pulse 74, temperature 98.1 F (36.7 C), temperature source Oral, height 5\' 10"  (1.778 m), weight 245 lb (111.1 kg), SpO2 98 %. Body mass index is 35.15 kg/m. Physical Exam  Constitutional: He is oriented to person,  place, and time. He appears well-developed and well-nourished.  HENT:  Head: Normocephalic and atraumatic.  Nose: Nose normal.  Eyes: EOM are normal. No scleral icterus.  Neck: Normal range of motion. Neck supple. No thyromegaly present.  Cardiovascular: Normal rate and regular rhythm.  Pulmonary/Chest: Effort normal. No respiratory distress.  Abdominal: Soft. There is no tenderness.  + Obesity  Musculoskeletal: Normal range of motion.  Range of Motion normal in all 4 extremities   Neurological: He is alert and oriented to person, place, and time. Coordination normal.  Skin: Skin is warm and dry.  Psychiatric: He has a normal mood and affect. His behavior is normal.  Vitals reviewed.   RECENT LABS AND TESTS: BMET    Component Value Date/Time   NA 139 10/23/2017 0953   K 4.4 10/23/2017 0953   CL 99 10/23/2017 0953   CO2 24 10/23/2017 0953   GLUCOSE 114 (H) 10/23/2017 0953   GLUCOSE 126 (H) 07/31/2017 0947   BUN 12 10/23/2017 0953   CREATININE 1.00 10/23/2017 0953   CREATININE 1.05 04/24/2015 1428   CALCIUM 9.5 10/23/2017 0953   GFRNONAA 82 10/23/2017 0953   GFRNONAA 79 04/24/2015 1428   GFRAA 95 10/23/2017 0953   GFRAA >89 04/24/2015 1428   Lab Results  Component Value Date   HGBA1C 6.5 (H) 10/23/2017   Lab Results  Component Value Date   INSULIN 14.1  10/23/2017   CBC    Component Value Date/Time   WBC 8.2 10/23/2017 0953   WBC 6.6 07/31/2017 0947   RBC 4.85 10/23/2017 0953   RBC 4.78 07/31/2017 0947   HGB 16.0 10/23/2017 0953   HCT 42.6 10/23/2017 0953   PLT 345.0 07/31/2017 0947   MCV 88 10/23/2017 0953   MCH 33.0 10/23/2017 0953   MCH 30.6 07/11/2016 1211   MCHC 37.6 (H) 10/23/2017 0953   MCHC 34.9 07/31/2017 0947   RDW 13.4 10/23/2017 0953   LYMPHSABS 2.2 10/23/2017 0953   MONOABS 0.4 07/31/2017 0947   EOSABS 0.5 (H) 10/23/2017 0953   BASOSABS 0.0 10/23/2017 0953   Iron/TIBC/Ferritin/ %Sat No results found for: IRON, TIBC, FERRITIN, IRONPCTSAT Lipid Panel     Component Value Date/Time   CHOL 143 10/23/2017 0953   TRIG 64 10/23/2017 0953   HDL 36 (L) 10/23/2017 0953   CHOLHDL 5 07/31/2017 0947   VLDL 20.4 07/31/2017 0947   LDLCALC 94 10/23/2017 0953   Hepatic Function Panel     Component Value Date/Time   PROT 6.4 10/23/2017 0953   ALBUMIN 4.2 10/23/2017 0953   AST 18 10/23/2017 0953   ALT 29 10/23/2017 0953   ALKPHOS 110 10/23/2017 0953   BILITOT 0.6 10/23/2017 0953   BILIDIR 0.1 07/31/2017 0947      Component Value Date/Time   TSH 0.695 10/23/2017 0953   TSH 1.04 07/31/2017 0947   TSH 0.41 06/19/2017 1544    ECG  shows NSR with a rate of 75 BPM INDIRECT CALORIMETER done today shows a VO2 of 259 and a REE of 1803.  His calculated basal metabolic rate is 5625 thus his basal metabolic rate is worse than expected.    ASSESSMENT AND PLAN: Other fatigue - Plan: EKG 12-Lead, T3, T4, free, TSH, Lipid Panel With LDL/HDL Ratio, VITAMIN D 25 Hydroxy (Vit-D Deficiency, Fractures), Vitamin B12, Folate, CBC With Differential  Moderate persistent asthma, unspecified whether complicated  Type 2 diabetes mellitus without complication, without long-term current use of insulin ([place]) - Plan: Microalbumin / creatinine urine ratio, Insulin,  random, Hemoglobin A1c, Comprehensive metabolic panel  Depression  screening  At risk for heart disease  Class 2 severe obesity with serious comorbidity and body mass index (BMI) of 35.0 to 35.9 in adult, unspecified obesity type [facility])  PLAN: Fatigue Thomas [name] was informed that his fatigue may be related to obesity, depression or many other causes. Labs will be ordered, and in the meanwhile Thomas [name] has agreed to work on diet, exercise and weight loss to help with fatigue. Proper sleep hygiene was discussed including the need for 7-8 hours of quality sleep each night. A sleep study was not ordered based on symptoms and Epworth score.  Moderate Persistent Asthma We will order Indirect Calorimetry today and Thomas [name] was advised on weight loss to help decrease asthma symptoms and exacerbations. Thomas [name] will follow up with our clinic at the agreed upon time.  Diabetes II Thomas [name] has been given extensive diabetes education by myself today including ideal fasting and post-prandial blood glucose readings, individual ideal Hgb A1c goals and hypoglycemia prevention. We discussed the importance of good blood sugar control to decrease the likelihood of diabetic complications such as nephropathy, neuropathy, limb loss, blindness, coronary artery disease, and death. We discussed the importance of intensive lifestyle modification including diet, exercise and weight loss as the first line treatment for diabetes. We will check labs today. Thomas [name] agrees to start diet prescription and will follow up at the agreed upon time.  Cardiovascular risk counseling Thomas [name] was given extended (15 minutes) coronary artery disease prevention counseling today. He is 57 y.o. male and has risk factors for heart disease including obesity and diabetes. We discussed intensive lifestyle modifications today with an emphasis on specific weight loss instructions and strategies. Pt was also informed of the importance of increasing exercise and decreasing saturated fats to help prevent heart  disease.  Depression Screen Thomas [name] had a negative depression screening. Depression is commonly associated with obesity and often results in emotional eating behaviors. We will monitor this closely and work on CBT to help improve the non-hunger eating patterns.   Obesity Thomas [name] is currently in the action stage of change and his goal is to continue with weight loss efforts. I recommend Thomas [name] begin the structured treatment plan as follows:  He has agreed to follow the Category 3 plan Thomas [name] has been instructed to eventually work up to a goal of 150 minutes of combined cardio and strengthening exercise per week for weight loss and overall health benefits. We discussed the following Behavioral Modification Strategies today: no skipping meals, increasing lean protein intake, decreasing simple carbohydrates  and dealing with family or coworker sabotage   He was informed of the importance of frequent follow up visits to maximize his success with intensive lifestyle modifications for his multiple health conditions. He was informed we would discuss his lab results at his next visit unless there is a critical issue that needs to be addressed sooner. Thomas [name] agreed to keep his next visit at the agreed upon time to discuss these results.    OBESITY BEHAVIORAL INTERVENTION VISIT  Today's visit was # 1   Starting weight: 245 lbs Starting date: 10/23/17 Today's weight : 245 lbs Today's date: 10/23/2017 Total lbs lost to date: 0   ASK: We discussed the diagnosis of obesity with Thomas [name] today and Thomas [name] agreed to give [name] permission to discuss obesity behavioral modification therapy today.  ASSESS: Olie has the diagnosis of obesity and his BMI today is 35.15 Quashawn is in the action stage of change  ADVISE: Dewaine was educated on the multiple health risks of obesity as well as the benefit of weight loss to improve his health. He was advised of the need for long term treatment and  the importance of lifestyle modifications to improve his current health and to decrease his risk of future health problems.  AGREE: Multiple dietary modification options and treatment options were discussed and  Nina agreed to follow the recommendations documented in the above note.  ARRANGE: Thomas [name] was educated on the importance of frequent visits to treat obesity as outlined per CMS and USPSTF guidelines and agreed to schedule his next follow up appointment today.  I, Doreene Nest, am acting as transcriptionist for Dennard Nip, MD  I have reviewed the above documentation for accuracy and completeness, and I agree with the above. -Dennard Nip, MD

## 2017-11-06 ENCOUNTER — Encounter (INDEPENDENT_AMBULATORY_CARE_PROVIDER_SITE_OTHER): Payer: Self-pay | Admitting: Bariatrics

## 2017-11-06 ENCOUNTER — Ambulatory Visit (INDEPENDENT_AMBULATORY_CARE_PROVIDER_SITE_OTHER): Payer: BLUE CROSS/BLUE SHIELD | Admitting: Bariatrics

## 2017-11-06 VITALS — BP 113/71 | HR 73 | Temp 97.4°F | Ht 70.0 in | Wt 243.0 lb

## 2017-11-06 DIAGNOSIS — Z9189 Other specified personal risk factors, not elsewhere classified: Secondary | ICD-10-CM

## 2017-11-06 DIAGNOSIS — Z8639 Personal history of other endocrine, nutritional and metabolic disease: Secondary | ICD-10-CM

## 2017-11-06 DIAGNOSIS — E119 Type 2 diabetes mellitus without complications: Secondary | ICD-10-CM

## 2017-11-06 DIAGNOSIS — E669 Obesity, unspecified: Secondary | ICD-10-CM | POA: Diagnosis not present

## 2017-11-06 DIAGNOSIS — Z6834 Body mass index (BMI) 34.0-34.9, adult: Secondary | ICD-10-CM

## 2017-11-06 MED ORDER — VITAMIN D (ERGOCALCIFEROL) 1.25 MG (50000 UNIT) PO CAPS: 50000.0000 [IU] | ORAL_CAPSULE | ORAL | 0 refills | Status: DC

## 2017-11-07 NOTE — Progress Notes (Signed)
Office: [phone]  /  Fax: [phone]   HPI:   Chief Complaint: OBESITY Thomas [name] is here to discuss his progress with his obesity treatment plan. He is on the Category 3 plan and is following his eating plan approximately 100 % of the time. He states he is exercising 0 minutes 0 times per week. Thomas [name] is currently struggling with his diet plan. He states it is "very bland" and he likes spices. He is not stress or emotional eating.  His weight is 243 lb (110.2 kg) today and has had a weight loss of 2 pounds over a period of 2 weeks since his last visit. He has lost 2 lbs since starting treatment with [name].  Diabetes II Thomas [name] has a diagnosis of diabetes type II. Thomas [name] states that his fasting BGs are in the 90's. He is not taking metformin because he had diarrhea. His last A1c was 6.5 and Insulin was 14.1 on 10/23/17. He denies any hypoglycemic episodes. He has been working on intensive lifestyle modifications including diet, exercise, and weight loss to help control his blood glucose levels.  History of Vitamin D deficiency Thomas [name] has a diagnosis of vitamin D deficiency. He is currently taking OTC vit D 1,000 IU daily. His last vitamin D was 38.4 on 10/23/17.  At risk for osteopenia and osteoporosis Thomas [name] is at higher risk of osteopenia and osteoporosis due to vitamin D deficiency.   ALLERGIES: Allergies  Allergen Reactions  . No Known Allergies     MEDICATIONS: Current Outpatient Medications on File Prior to Visit  Medication Sig Dispense Refill  . albuterol (PROAIR HFA) 108 (90 Base) MCG/ACT inhaler INHALE 2 PUFFS EVERY 6 HOURS AS NEEDED FOR WHEEZING OR SHORTNESS OF BREATH. 3 Inhaler 3  . albuterol (PROVENTIL) (2.5 MG/3ML) 0.083% nebulizer solution Take 3 mLs (2.5 mg total) by nebulization every 4 (four) hours as needed for wheezing or shortness of breath. 75 mL 5  . beclomethasone (QVAR REDIHALER) 80 MCG/ACT inhaler TAKE 1 PUFF BY MOUTH TWICE A DAY 3 Inhaler 3  .  fluticasone (FLONASE) 50 MCG/ACT nasal spray Place 2 sprays into both nostrils daily. 48 g 3  . HYDROcodone-homatropine (HYDROMET) 5-1.5 MG/5ML syrup Take 5 mLs by mouth every 4 (four) hours as needed. 240 mL 0  . hyoscyamine (LEVBID) 0.375 MG 12 hr tablet Take 0.375 mg by mouth daily as needed for cramping.   11  . lansoprazole (PREVACID) 15 MG capsule Take 15 mg by mouth daily.     Marland Kitchen tiZANidine (ZANAFLEX) 4 MG capsule Take 4 mg by mouth 3 (three) times daily as needed for muscle spasms.     No current facility-administered medications on file prior to visit.     PAST MEDICAL HISTORY: Past Medical History:  Diagnosis Date  . Allergy   . Asthma   . Back pain   . Diabetes ([place])   . Dyspnea   . GERD (gastroesophageal reflux disease)   . History of hiatal hernia   . Pneumonia    5 -7 yrs ago  . Swallowing difficulty     PAST SURGICAL HISTORY: Past Surgical History:  Procedure Laterality Date  . ANTERIOR CERVICAL DECOMP/DISCECTOMY FUSION N/A 07/14/2016   Procedure: Cervical five-six5- Cervical six-seven Anterior cervical decompression/discectomy/fusion;  Surgeon: Erline Levine, MD;  Location: [facility];  Service: Neurosurgery;  Laterality: N/A;  . COLON SURGERY    . COLONOSCOPY  12/22/2014   per Dr. Earlean Shawl, clear, repeat in 5 yrs (hx of adenomas)   . DG THUMB  LEFT HAND    . DG THUMB RIGHT HAND ([place] HX)    . FUNCTIONAL ENDOSCOPIC SINUS SURGERY    . PARTIAL COLECTOMY     sigmoid, Dr. Earlean Shawl    SOCIAL HISTORY: Social History   Tobacco Use  . Smoking status: Former Smoker    Packs/day: 1.00    Years: 10.00    Pack years: 10.00    Types: Cigarettes    Last attempt to quit: 01/31/1986    Years since quitting: 31.7  . Smokeless tobacco: Never Used  Substance Use Topics  . Alcohol use: Yes    Comment: rarely  . Drug use: No    FAMILY HISTORY: Family History  Problem Relation Age of Onset  . Cancer Mother   . Cancer Father   . Hyperlipidemia Father   . Diabetes Unknown         maternal family    ROS: Review of Systems  Constitutional: Positive for weight loss.  Gastrointestinal: Positive for diarrhea.  Endo/Heme/Allergies:       Negative for hypoglycemia.    PHYSICAL EXAM: Blood pressure 113/71, pulse 73, temperature (!) 97.4 F (36.3 C), temperature source Oral, height 5\' 10"  (1.778 m), weight 243 lb (110.2 kg), SpO2 96 %. Body mass index is 34.87 kg/m. Physical Exam  Constitutional: He is oriented to person, place, and time. He appears well-developed and well-nourished.  Cardiovascular: Normal rate.  Pulmonary/Chest: Effort normal.  Musculoskeletal: Normal range of motion.  Neurological: He is oriented to person, place, and time.  Skin: Skin is warm and dry.  Psychiatric: He has a normal mood and affect. His behavior is normal.  Vitals reviewed.   RECENT LABS AND TESTS: BMET    Component Value Date/Time   NA 139 10/23/2017 0953   K 4.4 10/23/2017 0953   CL 99 10/23/2017 0953   CO2 24 10/23/2017 0953   GLUCOSE 114 (H) 10/23/2017 0953   GLUCOSE 126 (H) 07/31/2017 0947   BUN 12 10/23/2017 0953   CREATININE 1.00 10/23/2017 0953   CREATININE 1.05 04/24/2015 1428   CALCIUM 9.5 10/23/2017 0953   GFRNONAA 82 10/23/2017 0953   GFRNONAA 79 04/24/2015 1428   GFRAA 95 10/23/2017 0953   GFRAA >89 04/24/2015 1428   Lab Results  Component Value Date   HGBA1C 6.5 (H) 10/23/2017   HGBA1C 8.6 (H) 07/07/2017   Lab Results  Component Value Date   INSULIN 14.1 10/23/2017   CBC    Component Value Date/Time   WBC 8.2 10/23/2017 0953   WBC 6.6 07/31/2017 0947   RBC 4.85 10/23/2017 0953   RBC 4.78 07/31/2017 0947   HGB 16.0 10/23/2017 0953   HCT 42.6 10/23/2017 0953   PLT 345.0 07/31/2017 0947   MCV 88 10/23/2017 0953   MCH 33.0 10/23/2017 0953   MCH 30.6 07/11/2016 1211   MCHC 37.6 (H) 10/23/2017 0953   MCHC 34.9 07/31/2017 0947   RDW 13.4 10/23/2017 0953   LYMPHSABS 2.2 10/23/2017 0953   MONOABS 0.4 07/31/2017 0947   EOSABS 0.5 (H)  10/23/2017 0953   BASOSABS 0.0 10/23/2017 0953   Iron/TIBC/Ferritin/ %Sat No results found for: IRON, TIBC, FERRITIN, IRONPCTSAT Lipid Panel     Component Value Date/Time   CHOL 143 10/23/2017 0953   TRIG 64 10/23/2017 0953   HDL 36 (L) 10/23/2017 0953   CHOLHDL 5 07/31/2017 0947   VLDL 20.4 07/31/2017 0947   LDLCALC 94 10/23/2017 0953   Hepatic Function Panel     Component Value Date/Time  PROT 6.4 10/23/2017 0953   ALBUMIN 4.2 10/23/2017 0953   AST 18 10/23/2017 0953   ALT 29 10/23/2017 0953   ALKPHOS 110 10/23/2017 0953   BILITOT 0.6 10/23/2017 0953   BILIDIR 0.1 07/31/2017 0947      Component Value Date/Time   TSH 0.695 10/23/2017 0953   TSH 1.04 07/31/2017 0947   TSH 0.41 06/19/2017 1544   Results for KAMARRION, STFORT (MRN [id number]) as of 11/07/2017 16:33  Ref. Range 10/23/2017 09:53  Vitamin D, 25-Hydroxy Latest Ref Range: 30.0 - 100.0 ng/mL 38.4    ASSESSMENT AND PLAN: Type 2 diabetes mellitus without complication, without long-term current use of insulin (HCC)  History of vitamin D deficiency - Plan: Vitamin D, Ergocalciferol, (DRISDOL) 50000 units CAPS capsule  At risk for osteoporosis  Class 1 obesity with serious comorbidity and body mass index (BMI) of 34.0 to 34.9 in adult, unspecified obesity type  PLAN:  Diabetes II Abdelaziz has been given extensive diabetes education by myself today including ideal fasting and post-prandial blood glucose readings, individual ideal Hgb A1c goals and hypoglycemia prevention. We discussed the importance of good blood sugar control to decrease the likelihood of diabetic complications such as nephropathy, neuropathy, limb loss, blindness, coronary artery disease, and death. We discussed the importance of intensive lifestyle modification including diet, exercise and weight loss as the first line treatment for diabetes. Thomas [name] declined any metformin or diabetes medications at this time and will follow up at the agreed upon  time in 2 weeks.  Vitamin D Deficiency Thomas [name] was informed that low vitamin D levels contributes to fatigue and are associated with obesity, breast, and colon cancer. He agrees to start to take prescription Vit D @50 ,000 IU, 1 capsule every week #12 with no refills and will follow up for routine testing of vitamin D, at least 2-3 times per year. He was informed of the risk of over-replacement of vitamin D and agrees to not increase his dose unless he discusses this with [name] first. We will recheck labs in 3 months. Thomas [name] agrees to follow up in 2 weeks.  At risk for osteopenia and osteoporosis Thomas [name] was given extended (15 minutes) osteoporosis prevention counseling today. Thomas [name] is at risk for osteopenia and osteoporosis due to his vitamin D deficiency. He was encouraged to take his vitamin D and follow his higher calcium diet and increase strengthening exercise to help strengthen his bones and decrease his risk of osteopenia and osteoporosis.  Obesity Thomas [name] is currently in the action stage of change. As such, his goal is to continue with weight loss efforts. He has agreed to follow the Category 3 plan. He agrees to decrease carbohydrates, increase protein, and will continue to read labels. Thomas [name] has been instructed to work up to a goal of 150 minutes of combined cardio and strengthening exercise per week for weight loss and overall health benefits. We discussed the following Behavioral Modification Strategies today: increasing lean protein intake, decreasing simple carbohydrates, increase H2O, and no skipping meals.  Thomas [name] has agreed to follow up with our clinic in 2 weeks. He was informed of the importance of frequent follow up visits to maximize his success with intensive lifestyle modifications for his multiple health conditions.   OBESITY BEHAVIORAL INTERVENTION VISIT  Today's visit was # 2   Starting weight: 245 lbs Starting date: 10/23/17 Today's weight : Weight: 243 lb (110.2  kg)  Today's date: 11/06/2017 Total lbs lost to date: 2  ASK: We discussed the diagnosis of obesity with  Thomas [name] today and Thomas [name] agreed to give [name] permission to discuss obesity behavioral modification therapy today.  ASSESS: Thomas [name] has the diagnosis of obesity and his BMI today is 34.87. Thomas [name] is in the action stage of change.   ADVISE: Thomas [name] was educated on the multiple health risks of obesity as well as the benefit of weight loss to improve his health. He was advised of the need for long term treatment and the importance of lifestyle modifications to improve his current health and to decrease his risk of future health problems.  AGREE: Multiple dietary modification options and treatment options were discussed and Thomas [name] agreed to follow the recommendations documented in the above note.  ARRANGE: Thomas [name] was educated on the importance of frequent visits to treat obesity as outlined per CMS and USPSTF guidelines and agreed to schedule his next follow up appointment today.  I, Thomas [name], am acting as Location manager for [employer]. Owens Shark, DO  I have reviewed the above documentation for accuracy and completeness, and I agree with the above. -Jearld Lesch, DO

## 2017-11-13 ENCOUNTER — Encounter: Payer: Self-pay | Admitting: Family Medicine

## 2017-11-13 ENCOUNTER — Ambulatory Visit: Payer: BLUE CROSS/BLUE SHIELD | Admitting: Family Medicine

## 2017-11-13 VITALS — BP 118/72 | HR 87 | Temp 98.5°F | Wt 244.0 lb

## 2017-11-13 DIAGNOSIS — J4521 Mild intermittent asthma with (acute) exacerbation: Secondary | ICD-10-CM

## 2017-11-13 DIAGNOSIS — J209 Acute bronchitis, unspecified: Secondary | ICD-10-CM

## 2017-11-13 MED ORDER — AZITHROMYCIN 250 MG PO TABS: ORAL_TABLET | ORAL | 0 refills | Status: DC

## 2017-11-13 NOTE — Progress Notes (Signed)
   Subjective:    Patient ID: Thomas [name], male    DOB: [birth year], 57 y.o.   MRN: [id number]  HPI Here for recurrent SOB and coughing up yellow sputum. He had a fever one day last week but none since. Using his inhalers daily.    Review of Systems  Constitutional: Positive for fever.  HENT: Positive for congestion. Negative for sinus pressure, sinus pain and sore throat.   Eyes: Negative.   Respiratory: Positive for cough, chest tightness and shortness of breath. Negative for wheezing.   Cardiovascular: Negative.        Objective:   Physical Exam  Constitutional: He appears well-developed and well-nourished.  HENT:  Right Ear: External ear normal.  Left Ear: External ear normal.  Nose: Nose normal.  Mouth/Throat: Oropharynx is clear and moist.  Eyes: Conjunctivae are normal.  Neck: No thyromegaly present.  Cardiovascular: Normal rate, regular rhythm, normal heart sounds and intact distal pulses.  Pulmonary/Chest: Effort normal and breath sounds normal. No stridor. No respiratory distress.  Lymphadenopathy:    He has no cervical adenopathy.          Assessment & Plan:  Bronchitis, treat with a Zpack. For the asthma and persistent SOB, refer to Pulmonary. Alysia Penna, MD

## 2017-11-20 ENCOUNTER — Ambulatory Visit (INDEPENDENT_AMBULATORY_CARE_PROVIDER_SITE_OTHER): Payer: BLUE CROSS/BLUE SHIELD | Admitting: Bariatrics

## 2017-11-20 ENCOUNTER — Encounter (INDEPENDENT_AMBULATORY_CARE_PROVIDER_SITE_OTHER): Payer: Self-pay | Admitting: Bariatrics

## 2017-11-20 VITALS — BP 127/74 | HR 83 | Temp 98.4°F | Ht 70.0 in | Wt 235.0 lb

## 2017-11-20 DIAGNOSIS — E669 Obesity, unspecified: Secondary | ICD-10-CM | POA: Diagnosis not present

## 2017-11-20 DIAGNOSIS — E559 Vitamin D deficiency, unspecified: Secondary | ICD-10-CM

## 2017-11-20 DIAGNOSIS — E119 Type 2 diabetes mellitus without complications: Secondary | ICD-10-CM

## 2017-11-20 DIAGNOSIS — Z6833 Body mass index (BMI) 33.0-33.9, adult: Secondary | ICD-10-CM

## 2017-11-23 NOTE — Progress Notes (Signed)
Office: [phone]  /  Fax: [phone]   HPI:   Chief Complaint: OBESITY Thomas [name] is here to discuss his progress with his obesity treatment plan. He is on the Category 3 plan and is following his eating plan approximately 100 % of the time. He states he is walking 40 minutes 4 times per week. Thomas [name] is not stress eating and his appetite is controlled.  His weight is 235 lb (106.6 kg) today and has had a weight loss of 8 pounds over a period of 2 weeks since his last visit. He has lost 10 lbs since starting treatment with [name].  Vitamin D deficiency Thomas [name] has a diagnosis of vitamin D deficiency. He is currently taking high dose vit D with no side effects. He denies nausea, vomiting or muscle weakness.  Diabetes II Thomas [name] has a diagnosis of diabetes type II. Thomas [name] states that his fasting BGs range between 90 and 117 and he denies any hypoglycemic episodes. His last A1c was 6.5 and Insulin was 14.1 on 10/23/17. He is not taking and diabetic medications at this time and he does not want to start. He has been working on intensive lifestyle modifications including diet, exercise, and weight loss to help control his blood glucose levels.  ALLERGIES: Allergies  Allergen Reactions  . No Known Allergies     MEDICATIONS: Current Outpatient Medications on File Prior to Visit  Medication Sig Dispense Refill  . albuterol (PROAIR HFA) 108 (90 Base) MCG/ACT inhaler INHALE 2 PUFFS EVERY 6 HOURS AS NEEDED FOR WHEEZING OR SHORTNESS OF BREATH. 3 Inhaler 3  . albuterol (PROVENTIL) (2.5 MG/3ML) 0.083% nebulizer solution Take 3 mLs (2.5 mg total) by nebulization every 4 (four) hours as needed for wheezing or shortness of breath. 75 mL 5  . azithromycin (ZITHROMAX Z-PAK) 250 MG tablet As directed 6 each 0  . beclomethasone (QVAR REDIHALER) 80 MCG/ACT inhaler TAKE 1 PUFF BY MOUTH TWICE A DAY 3 Inhaler 3  . fluticasone (FLONASE) 50 MCG/ACT nasal spray Place 2 sprays into both nostrils daily. 48 g 3  .  HYDROcodone-homatropine (HYDROMET) 5-1.5 MG/5ML syrup Take 5 mLs by mouth every 4 (four) hours as needed. 240 mL 0  . hyoscyamine (LEVBID) 0.375 MG 12 hr tablet Take 0.375 mg by mouth daily as needed for cramping.   11  . lansoprazole (PREVACID) 15 MG capsule Take 15 mg by mouth daily.     Marland Kitchen tiZANidine (ZANAFLEX) 4 MG capsule Take 4 mg by mouth 3 (three) times daily as needed for muscle spasms.    . Vitamin D, Ergocalciferol, (DRISDOL) 50000 units CAPS capsule Take 1 capsule (50,000 Units total) by mouth every 7 (seven) days. 12 capsule 0   No current facility-administered medications on file prior to visit.     PAST MEDICAL HISTORY: Past Medical History:  Diagnosis Date  . Allergy   . Asthma   . Back pain   . Diabetes ([place])   . Dyspnea   . GERD (gastroesophageal reflux disease)   . History of hiatal hernia   . Pneumonia    5 -7 yrs ago  . Swallowing difficulty     PAST SURGICAL HISTORY: Past Surgical History:  Procedure Laterality Date  . ANTERIOR CERVICAL DECOMP/DISCECTOMY FUSION N/A 07/14/2016   Procedure: Cervical five-six5- Cervical six-seven Anterior cervical decompression/discectomy/fusion;  Surgeon: Erline Levine, MD;  Location: [facility];  Service: Neurosurgery;  Laterality: N/A;  . COLON SURGERY    . COLONOSCOPY  12/22/2014   per Dr. Earlean Shawl, clear, repeat in  5 yrs (hx of adenomas)   . DG THUMB LEFT HAND    . DG THUMB RIGHT HAND ([place] HX)    . FUNCTIONAL ENDOSCOPIC SINUS SURGERY    . PARTIAL COLECTOMY     sigmoid, Dr. Earlean Shawl    SOCIAL HISTORY: Social History   Tobacco Use  . Smoking status: Former Smoker    Packs/day: 1.00    Years: 10.00    Pack years: 10.00    Types: Cigarettes    Last attempt to quit: 01/31/1986    Years since quitting: 31.8  . Smokeless tobacco: Never Used  Substance Use Topics  . Alcohol use: Yes    Comment: rarely  . Drug use: No    FAMILY HISTORY: Family History  Problem Relation Age of Onset  . Cancer Mother   . Cancer Father     . Hyperlipidemia Father   . Diabetes Unknown        maternal family    ROS: Review of Systems  Constitutional: Positive for weight loss.  Gastrointestinal: Negative for nausea and vomiting.  Musculoskeletal:       Negative for muscle weakness.  Endo/Heme/Allergies:       Negative for hypoglycemia.    PHYSICAL EXAM: Blood pressure 127/74, pulse 83, temperature 98.4 F (36.9 C), temperature source Oral, height 5\' 10"  (1.778 m), weight 235 lb (106.6 kg), SpO2 97 %. Body mass index is 33.72 kg/m. Physical Exam  Constitutional: He is oriented to person, place, and time. He appears well-developed and well-nourished.  Cardiovascular: Normal rate.  Pulmonary/Chest: Effort normal.  Musculoskeletal: Normal range of motion.  Neurological: He is oriented to person, place, and time.  Skin: Skin is warm and dry.  Vitals reviewed.   RECENT LABS AND TESTS: BMET    Component Value Date/Time   NA 139 10/23/2017 0953   K 4.4 10/23/2017 0953   CL 99 10/23/2017 0953   CO2 24 10/23/2017 0953   GLUCOSE 114 (H) 10/23/2017 0953   GLUCOSE 126 (H) 07/31/2017 0947   BUN 12 10/23/2017 0953   CREATININE 1.00 10/23/2017 0953   CREATININE 1.05 04/24/2015 1428   CALCIUM 9.5 10/23/2017 0953   GFRNONAA 82 10/23/2017 0953   GFRNONAA 79 04/24/2015 1428   GFRAA 95 10/23/2017 0953   GFRAA >89 04/24/2015 1428   Lab Results  Component Value Date   HGBA1C 6.5 (H) 10/23/2017   HGBA1C 8.6 (H) 07/07/2017   Lab Results  Component Value Date   INSULIN 14.1 10/23/2017   CBC    Component Value Date/Time   WBC 8.2 10/23/2017 0953   WBC 6.6 07/31/2017 0947   RBC 4.85 10/23/2017 0953   RBC 4.78 07/31/2017 0947   HGB 16.0 10/23/2017 0953   HCT 42.6 10/23/2017 0953   PLT 345.0 07/31/2017 0947   MCV 88 10/23/2017 0953   MCH 33.0 10/23/2017 0953   MCH 30.6 07/11/2016 1211   MCHC 37.6 (H) 10/23/2017 0953   MCHC 34.9 07/31/2017 0947   RDW 13.4 10/23/2017 0953   LYMPHSABS 2.2 10/23/2017 0953    MONOABS 0.4 07/31/2017 0947   EOSABS 0.5 (H) 10/23/2017 0953   BASOSABS 0.0 10/23/2017 0953   Iron/TIBC/Ferritin/ %Sat No results found for: IRON, TIBC, FERRITIN, IRONPCTSAT Lipid Panel     Component Value Date/Time   CHOL 143 10/23/2017 0953   TRIG 64 10/23/2017 0953   HDL 36 (L) 10/23/2017 0953   CHOLHDL 5 07/31/2017 0947   VLDL 20.4 07/31/2017 0947   LDLCALC 94 10/23/2017 0953  Hepatic Function Panel     Component Value Date/Time   PROT 6.4 10/23/2017 0953   ALBUMIN 4.2 10/23/2017 0953   AST 18 10/23/2017 0953   ALT 29 10/23/2017 0953   ALKPHOS 110 10/23/2017 0953   BILITOT 0.6 10/23/2017 0953   BILIDIR 0.1 07/31/2017 0947      Component Value Date/Time   TSH 0.695 10/23/2017 0953   TSH 1.04 07/31/2017 0947   TSH 0.41 06/19/2017 1544   Results for ADONIAS, DEMORE (MRN [id number]) as of 11/23/2017 07:11  Ref. Range 10/23/2017 09:53  Vitamin D, 25-Hydroxy Latest Ref Range: 30.0 - 100.0 ng/mL 38.4   ASSESSMENT AND PLAN: Vitamin D deficiency  Type 2 diabetes mellitus without complication, without long-term current use of insulin (HCC)  Class 1 obesity with serious comorbidity and body mass index (BMI) of 33.0 to 33.9 in adult, unspecified obesity type  PLAN:  Vitamin D Deficiency Thomas [name] was informed that low vitamin D levels contributes to fatigue and are associated with obesity, breast, and colon cancer. He agrees to continue to take prescription Vit D @50 ,000 IU every week and will follow up for routine testing of vitamin D, at least 2-3 times per year. He was informed of the risk of over-replacement of vitamin D and agrees to not increase his dose unless he discusses this with [name] first. He agrees to follow up at the agreed upon time.  Diabetes II Thomas [name] has been given extensive diabetes education by myself today including ideal fasting and post-prandial blood glucose readings, individual ideal Hgb A1c goals, and hypoglycemia prevention. We discussed the  importance of good blood sugar control to decrease the likelihood of diabetic complications such as nephropathy, neuropathy, limb loss, blindness, coronary artery disease, and death. We discussed the importance of intensive lifestyle modification including diet, exercise and weight loss as the first line treatment for diabetes. Thomas [name] agrees to continue his diet and exerercise and will follow up at the agreed upon time in 2 weeks.  I spent > than 50% of the 15 minute visit on counseling as documented in the note.  Obesity Thomas [name] is currently in the action stage of change. As such, his goal is to continue with weight loss efforts. He has agreed to follow the Category 3 plan. He agrees to continue increase protein and meal planning. Thomas [name] has been instructed to work up to a goal of 150 minutes of combined cardio and strengthening exercise per week for weight loss and overall health benefits. We discussed the following Behavioral Modification Strategies today: increasing lean protein intake, decrease simple carbohydrates, increasing vegetables, increase H2O intake, no skipping meals, and meal planning and cooking strategies.  Thomas [name] has agreed to follow up with our clinic in 2 weeks. He was informed of the importance of frequent follow up visits to maximize his success with intensive lifestyle modifications for his multiple health conditions.   OBESITY BEHAVIORAL INTERVENTION VISIT  Today's visit was # 3   Starting weight: 245 lbs Starting date: 10/23/17 Today's weight : Weight: 235 lb (106.6 kg)  Today's date: 11/20/2017 Total lbs lost to date: 10  ASK: We discussed the diagnosis of obesity with Thomas [name] today and Thomas [name] agreed to give [name] permission to discuss obesity behavioral modification therapy today.  ASSESS: Thomas [name] has the diagnosis of obesity and his BMI today is 33.72. Thomas [name] is in the action stage of change.   ADVISE: Thomas [name] was educated on the multiple health  risks of obesity as well as the benefit of  weight loss to improve his health. He was advised of the need for long term treatment and the importance of lifestyle modifications to improve his current health and to decrease his risk of future health problems.  AGREE: Multiple dietary modification options and treatment options were discussed and Thomas [name] agreed to follow the recommendations documented in the above note.  ARRANGE: Thomas [name] was educated on the importance of frequent visits to treat obesity as outlined per CMS and USPSTF guidelines and agreed to schedule his next follow up appointment today.  I, Marcille Blanco, am acting as Location manager for [employer]. Owens Shark, DO  I have reviewed the above documentation for accuracy and completeness, and I agree with the above. -Jearld Lesch, DO

## 2017-11-27 ENCOUNTER — Ambulatory Visit (INDEPENDENT_AMBULATORY_CARE_PROVIDER_SITE_OTHER): Payer: BLUE CROSS/BLUE SHIELD | Admitting: Pulmonary Disease

## 2017-11-27 ENCOUNTER — Other Ambulatory Visit: Payer: BLUE CROSS/BLUE SHIELD

## 2017-11-27 ENCOUNTER — Other Ambulatory Visit: Payer: Self-pay | Admitting: Family Medicine

## 2017-11-27 ENCOUNTER — Encounter: Payer: Self-pay | Admitting: Pulmonary Disease

## 2017-11-27 VITALS — BP 128/64 | HR 74 | Ht 69.5 in | Wt 240.6 lb

## 2017-11-27 DIAGNOSIS — R0602 Shortness of breath: Secondary | ICD-10-CM

## 2017-11-27 DIAGNOSIS — J4551 Severe persistent asthma with (acute) exacerbation: Secondary | ICD-10-CM | POA: Diagnosis not present

## 2017-11-27 LAB — NITRIC OXIDE: NITRIC OXIDE: 217

## 2017-11-27 MED ORDER — MONTELUKAST SODIUM 10 MG PO TABS: 10.0000 mg | ORAL_TABLET | Freq: Every day | ORAL | 2 refills | Status: DC

## 2017-11-27 MED ORDER — BUDESONIDE-FORMOTEROL FUMARATE 160-4.5 MCG/ACT IN AERO: 2.0000 | INHALATION_SPRAY | Freq: Two times a day (BID) | RESPIRATORY_TRACT | 6 refills | Status: DC

## 2017-11-27 MED ORDER — PREDNISONE 10 MG PO TABS: ORAL_TABLET | ORAL | 0 refills | Status: DC

## 2017-11-27 NOTE — Progress Notes (Signed)
Thomas [name]    [id number]    Sep 30, 1960  Primary Care Physician:Fry, Ishmael Holter, MD  Referring Physician: Laurey Morale, MD [place], [place] [address]  Chief complaint: Consult for dyspnea  HPI: 57 year old with history of allergies, asthma, diabetes, GERD, dyspnea Complains of occasional dyspnea, productive cough with yellow mucus and wheezing for the past 1 year.  Seen by his primary care on 10/14 and given Z-Pak and referred to pulmonary.  Maintained on Qvar and albuterol.  He is using albuterol up to 3 times a day, has daily nighttime awakening Follows with Dr. 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   Underwent FEES in April 2019.  He saw Dr. [name] today who got sinus cultures.  Continues on Flonase He was on aspirin for chronic pain but is not using it anymore.  Pets: Used to have a dog, no cats, birds, farm animals Occupation: Works as a Administrator for [employer], Theme park manager Exposures: No known exposures, mold mold, hot tub, Jacuzzi Smoking history: 10-pack-year smoker.  Quit in 1990 Travel history: Drives to [place] as a Administrator.  No other significant travel. Relevant family history: No significant family history of lung disease.  Outpatient Encounter Medications as of 11/27/2017  Medication Sig  . albuterol (PROAIR HFA) 108 (90 Base) MCG/ACT inhaler INHALE 2 PUFFS EVERY 6 HOURS AS NEEDED FOR WHEEZING OR SHORTNESS OF BREATH.  Marland Kitchen albuterol (PROVENTIL) (2.5 MG/3ML) 0.083% nebulizer solution Take 3 mLs (2.5 mg total) by nebulization every 4 (four) hours as needed for wheezing or shortness of breath.  Marland Kitchen azithromycin (ZITHROMAX Z-PAK) 250 MG tablet As directed  . beclomethasone (QVAR REDIHALER) 80 MCG/ACT inhaler TAKE 1 PUFF BY MOUTH TWICE A DAY  . fluticasone (FLONASE) 50 MCG/ACT nasal spray Place 2 sprays into both nostrils daily.  Marland Kitchen HYDROcodone-homatropine (HYDROMET) 5-1.5 MG/5ML syrup Take 5 mLs by mouth every 4  (four) hours as needed.  . hyoscyamine (LEVBID) 0.375 MG 12 hr tablet Take 0.375 mg by mouth daily as needed for cramping.   . lansoprazole (PREVACID) 15 MG capsule Take 15 mg by mouth daily.   Marland Kitchen tiZANidine (ZANAFLEX) 4 MG capsule Take 4 mg by mouth 3 (three) times daily as needed for muscle spasms.  . Vitamin D, Ergocalciferol, (DRISDOL) 50000 units CAPS capsule Take 1 capsule (50,000 Units total) by mouth every 7 (seven) days.   No facility-administered encounter medications on file as of 11/27/2017.     Allergies as of 11/27/2017 - Review Complete 11/20/2017  Allergen Reaction Noted  . No known allergies  07/13/2016    Past Medical History:  Diagnosis Date  . Allergy   . Asthma   . Back pain   . Diabetes ([place])   . Dyspnea   . GERD (gastroesophageal reflux disease)   . History of hiatal hernia   . Pneumonia    5 -7 yrs ago  . Swallowing difficulty     Past Surgical History:  Procedure Laterality Date  . ANTERIOR CERVICAL DECOMP/DISCECTOMY FUSION N/A 07/14/2016   Procedure: Cervical five-six5- Cervical six-seven Anterior cervical decompression/discectomy/fusion;  Surgeon: Erline Levine, MD;  Location: [facility];  Service: Neurosurgery;  Laterality: N/A;  . COLON SURGERY    . COLONOSCOPY  12/22/2014   per Dr. Earlean Shawl, clear, repeat in 5 yrs (hx of adenomas)   . DG THUMB LEFT HAND    . DG THUMB RIGHT HAND ([facility] HX)    . FUNCTIONAL ENDOSCOPIC  SINUS SURGERY    . PARTIAL COLECTOMY     sigmoid, Dr. Earlean Shawl    Family History  Problem Relation Age of Onset  . Cancer Mother   . Cancer Father   . Hyperlipidemia Father   . Diabetes Unknown        maternal family    Social History   Socioeconomic History  . Marital status: Married    Spouse name: Davaun Quintela  . Number of children: 3  . Years of education: Not on file  . Highest education level: Not on file  Occupational History  . Not on file  Social Needs  . Financial resource strain: Not on file  . Food insecurity:     Worry: Not on file    Inability: Not on file  . Transportation needs:    Medical: Not on file    Non-medical: Not on file  Tobacco Use  . Smoking status: Former Smoker    Packs/day: 1.00    Years: 10.00    Pack years: 10.00    Types: Cigarettes    Last attempt to quit: 01/31/1986    Years since quitting: 31.8  . Smokeless tobacco: Never Used  Substance and Sexual Activity  . Alcohol use: Yes    Comment: rarely  . Drug use: No  . Sexual activity: Not on file  Lifestyle  . Physical activity:    Days per week: Not on file    Minutes per session: Not on file  . Stress: Not on file  Relationships  . Social connections:    Talks on phone: Not on file    Gets together: Not on file    Attends religious service: Not on file    Active member of club or organization: Not on file    Attends meetings of clubs or organizations: Not on file    Relationship status: Not on file  . Intimate partner violence:    Fear of current or ex partner: Not on file    Emotionally abused: Not on file    Physically abused: Not on file    Forced sexual activity: Not on file  Other Topics Concern  . Not on file  Social History Narrative  . Not on file    Review of systems: Review of Systems  Constitutional: Negative for fever and chills.  HENT: Negative.   Eyes: Negative for blurred vision.  Respiratory: as per HPI  Cardiovascular: Negative for chest pain and palpitations.  Gastrointestinal: Negative for vomiting, diarrhea, blood per rectum. Genitourinary: Negative for dysuria, urgency, frequency and hematuria.  Musculoskeletal: Negative for myalgias, back pain and joint pain.  Skin: Negative for itching and rash.  Neurological: Negative for dizziness, tremors, focal weakness, seizures and loss of consciousness.  Endo/Heme/Allergies: Negative for environmental allergies.  Psychiatric/Behavioral: Negative for depression, suicidal ideas and hallucinations.  All other systems reviewed and are  negative.  Physical Exam: Blood pressure 128/64, pulse 74, height 5' 9.5" (1.765 m), weight 240 lb 9.6 oz (109.1 kg), SpO2 98 %. Gen:      No acute distress HEENT:  EOMI, sclera anicteric Neck:     Drainage at the back of the throat Lungs:    Bilateral expiratory wheeze, scattered crackles. CV:         Regular rate and rhythm; no murmurs Abd:      + bowel sounds; soft, non-tender; no palpable masses, no distension Ext:    No edema; adequate peripheral perfusion Skin:      Warm  and dry; no rash Neuro: alert and oriented x 3 Psych: normal mood and affect  Data Reviewed: Imaging: Chest x-ray 08/12/2017- chronic bronchitic changes, no acute cardiopulmonary ab normalities.  I have reviewed the images personally.  PFTs: Pending  FENO 11/27/17- 217 ACQ6 11/27/17 - 3  Labs:  CBC 10/23/2017-WBC 8.2, eos 6%, absolute eosinophil count 492  Assessment:  Severe persistent asthma Associated with nasal polyposis, chronic sinusitis, possible aspirin allergy, peripheral eosinophilia Advised him to avoid aspirin and NSAIDs  Check IgE level, pulmonary function test We will give a short prednisone taper as he is wheezing in office today. Stop Qvar and start him on Symbicort 160/4.5 Start Singulair daily  If symptoms continue then consider treatment with biologic agents.  Plan/Recommendations: - Check IgE, PFTs - Prednisone taper starting at 40 mg.  Reduce dose by 10 mg every 3 days - Stop Qvar, start Symbicort - Continue albuterol rescue inhaler - Start Singulair.  Marshell Garfinkel MD [facility] 11/27/2017, 2:36 PM  CC: Laurey Morale, MD

## 2017-11-27 NOTE — Patient Instructions (Signed)
We will give you a prednisone taper starting at 40 mg.  Reduce dose by 10 mg every 3 days We will start you on Symbicort 160/4.5 twice daily and singular daily Schedule you for pulmonary function test on return Follow-up in 1 to 2 months.

## 2017-11-28 ENCOUNTER — Telehealth: Payer: Self-pay | Admitting: *Deleted

## 2017-11-28 LAB — IGE: IGE (IMMUNOGLOBULIN E), SERUM: 229 kU/L — AB (ref <=114)

## 2017-11-28 NOTE — Telephone Encounter (Signed)
Spoke with [place] and advised we need signed release for records. She will have pt come by and complete one. Nothing further needed.

## 2017-11-28 NOTE — Telephone Encounter (Signed)
Copied from [place] [id number]. Topic: Medical Record Request - Other >> Nov 28, 2017 10:19 AM Burchel, Abbi R wrote: [facility] Nurse Ph: [phone] ext [phone] Fx: [phone]  Requesting last OV notes and most recent labs for med review/pt check-up.

## 2017-12-04 ENCOUNTER — Ambulatory Visit (INDEPENDENT_AMBULATORY_CARE_PROVIDER_SITE_OTHER): Payer: BLUE CROSS/BLUE SHIELD | Admitting: Family Medicine

## 2017-12-04 ENCOUNTER — Encounter (INDEPENDENT_AMBULATORY_CARE_PROVIDER_SITE_OTHER): Payer: Self-pay | Admitting: Family Medicine

## 2017-12-04 VITALS — BP 118/67 | HR 70 | Temp 98.4°F | Ht 70.0 in | Wt 234.0 lb

## 2017-12-04 DIAGNOSIS — E669 Obesity, unspecified: Secondary | ICD-10-CM

## 2017-12-04 DIAGNOSIS — E559 Vitamin D deficiency, unspecified: Secondary | ICD-10-CM | POA: Diagnosis not present

## 2017-12-04 DIAGNOSIS — E1165 Type 2 diabetes mellitus with hyperglycemia: Secondary | ICD-10-CM | POA: Diagnosis not present

## 2017-12-04 DIAGNOSIS — Z6833 Body mass index (BMI) 33.0-33.9, adult: Secondary | ICD-10-CM

## 2017-12-05 NOTE — Progress Notes (Signed)
Office: [phone]  /  Fax: [phone]   HPI:   Chief Complaint: OBESITY Shayn is here to discuss his progress with his obesity treatment plan. He is on the Category 3 plan and is following his eating plan approximately 95 % of the time. He states he is walking 30 minutes 3 to 4 times per week. Granvel has had lots of breathing issues and is being worked up by pulmonology. He is on prednisone, but hasn't noticed increased hunger. He is getting in between 8 to 10 ounces of meat at night.  His weight is 234 lb (106.1 kg) today and has had a weight loss of 1 pound over a period of 2 weeks since his last visit. He has lost 11 lbs since starting treatment with [name].  Diabetes II without insulin use with hyperglycemia Bearett has a diagnosis of diabetes type II. Vidur's A1c was 6.5 on 10/23/17 and has improved from 8.6 on 07/07/17. He is not on medications currently. He has been working on intensive lifestyle modifications including diet, exercise, and weight loss to help control his blood glucose levels.  Vitamin D deficiency Louden has a diagnosis of vitamin D deficiency. He is currently taking vit D. He admits fatigue and denies nausea, vomiting or muscle weakness.  ALLERGIES: Allergies  Allergen Reactions  . No Known Allergies     MEDICATIONS: Current Outpatient Medications on File Prior to Visit  Medication Sig Dispense Refill  . albuterol (PROAIR HFA) 108 (90 Base) MCG/ACT inhaler INHALE 2 PUFFS EVERY 6 HOURS AS NEEDED FOR WHEEZING OR SHORTNESS OF BREATH. 3 Inhaler 3  . albuterol (PROVENTIL) (2.5 MG/3ML) 0.083% nebulizer solution Take 3 mLs (2.5 mg total) by nebulization every 4 (four) hours as needed for wheezing or shortness of breath. 75 mL 5  . budesonide-formoterol (SYMBICORT) 160-4.5 MCG/ACT inhaler Inhale 2 puffs into the lungs 2 (two) times daily. 1 Inhaler 6  . fluticasone (FLONASE) 50 MCG/ACT nasal spray Place 2 sprays into both nostrils daily. 48 g 3  . hyoscyamine  (LEVBID) 0.375 MG 12 hr tablet Take 0.375 mg by mouth daily as needed for cramping.   11  . lansoprazole (PREVACID) 15 MG capsule Take 15 mg by mouth daily.     . montelukast (SINGULAIR) 10 MG tablet Take 1 tablet (10 mg total) by mouth at bedtime. 30 tablet 2  . predniSONE (DELTASONE) 10 MG tablet 4 tabs x 3 days, 3 tabs x 3 days, 2 tabs x 3 days, 1 tab x 3 days then stop 30 tablet 0  . tiZANidine (ZANAFLEX) 4 MG capsule Take 4 mg by mouth 3 (three) times daily as needed for muscle spasms.    . Vitamin D, Ergocalciferol, (DRISDOL) 50000 units CAPS capsule Take 1 capsule (50,000 Units total) by mouth every 7 (seven) days. 12 capsule 0   No current facility-administered medications on file prior to visit.     PAST MEDICAL HISTORY: Past Medical History:  Diagnosis Date  . Allergy   . Asthma   . Back pain   . Diabetes ([facility])   . Dyspnea   . GERD (gastroesophageal reflux disease)   . History of hiatal hernia   . Pneumonia    5 -7 yrs ago  . Swallowing difficulty     PAST SURGICAL HISTORY: Past Surgical History:  Procedure Laterality Date  . ANTERIOR CERVICAL DECOMP/DISCECTOMY FUSION N/A 07/14/2016   Procedure: Cervical five-six5- Cervical six-seven Anterior cervical decompression/discectomy/fusion;  Surgeon: Erline Levine, MD;  Location: [facility];  Service:  Neurosurgery;  Laterality: N/A;  . COLON SURGERY    . COLONOSCOPY  12/22/2014   per Dr. Earlean Shawl, clear, repeat in 5 yrs (hx of adenomas)   . DG THUMB LEFT HAND    . DG THUMB RIGHT HAND ([facility] HX)    . FUNCTIONAL ENDOSCOPIC SINUS SURGERY    . PARTIAL COLECTOMY     sigmoid, Dr. Earlean Shawl    SOCIAL HISTORY: Social History   Tobacco Use  . Smoking status: Former Smoker    Packs/day: 1.00    Years: 10.00    Pack years: 10.00    Types: Cigarettes    Last attempt to quit: 01/31/1986    Years since quitting: 31.8  . Smokeless tobacco: Never Used  Substance Use Topics  . Alcohol use: Yes    Comment: rarely  . Drug use: No     FAMILY HISTORY: Family History  Problem Relation Age of Onset  . Cancer Mother   . Cancer Father   . Hyperlipidemia Father   . Diabetes Unknown        maternal family    ROS: Review of Systems  Constitutional: Positive for malaise/fatigue and weight loss.  Gastrointestinal: Negative for nausea and vomiting.  Musculoskeletal:       Negative for muscle weakness.  Endo/Heme/Allergies:       Positive for hyperglycemia.    PHYSICAL EXAM: Blood pressure 118/67, pulse 70, temperature 98.4 F (36.9 C), temperature source Oral, height 5\' 10"  (1.778 m), weight 234 lb (106.1 kg), SpO2 98 %. Body mass index is 33.58 kg/m. Physical Exam  Constitutional: He is oriented to person, place, and time. He appears well-developed and well-nourished.  Cardiovascular: Normal rate.  Pulmonary/Chest: Effort normal.  Musculoskeletal: Normal range of motion.  Neurological: He is oriented to person, place, and time.  Skin: Skin is warm and dry.  Psychiatric: He has a normal mood and affect. His behavior is normal.  Vitals reviewed.   RECENT LABS AND TESTS: BMET    Component Value Date/Time   NA 139 10/23/2017 0953   K 4.4 10/23/2017 0953   CL 99 10/23/2017 0953   CO2 24 10/23/2017 0953   GLUCOSE 114 (H) 10/23/2017 0953   GLUCOSE 126 (H) 07/31/2017 0947   BUN 12 10/23/2017 0953   CREATININE 1.00 10/23/2017 0953   CREATININE 1.05 04/24/2015 1428   CALCIUM 9.5 10/23/2017 0953   GFRNONAA 82 10/23/2017 0953   GFRNONAA 79 04/24/2015 1428   GFRAA 95 10/23/2017 0953   GFRAA >89 04/24/2015 1428   Lab Results  Component Value Date   HGBA1C 6.5 (H) 10/23/2017   HGBA1C 8.6 (H) 07/07/2017   Lab Results  Component Value Date   INSULIN 14.1 10/23/2017   CBC    Component Value Date/Time   WBC 8.2 10/23/2017 0953   WBC 6.6 07/31/2017 0947   RBC 4.85 10/23/2017 0953   RBC 4.78 07/31/2017 0947   HGB 16.0 10/23/2017 0953   HCT 42.6 10/23/2017 0953   PLT 345.0 07/31/2017 0947   MCV 88  10/23/2017 0953   MCH 33.0 10/23/2017 0953   MCH 30.6 07/11/2016 1211   MCHC 37.6 (H) 10/23/2017 0953   MCHC 34.9 07/31/2017 0947   RDW 13.4 10/23/2017 0953   LYMPHSABS 2.2 10/23/2017 0953   MONOABS 0.4 07/31/2017 0947   EOSABS 0.5 (H) 10/23/2017 0953   BASOSABS 0.0 10/23/2017 0953   Iron/TIBC/Ferritin/ %Sat No results found for: IRON, TIBC, FERRITIN, IRONPCTSAT Lipid Panel     Component Value Date/Time  CHOL 143 10/23/2017 0953   TRIG 64 10/23/2017 0953   HDL 36 (L) 10/23/2017 0953   CHOLHDL 5 07/31/2017 0947   VLDL 20.4 07/31/2017 0947   LDLCALC 94 10/23/2017 0953   Hepatic Function Panel     Component Value Date/Time   PROT 6.4 10/23/2017 0953   ALBUMIN 4.2 10/23/2017 0953   AST 18 10/23/2017 0953   ALT 29 10/23/2017 0953   ALKPHOS 110 10/23/2017 0953   BILITOT 0.6 10/23/2017 0953   BILIDIR 0.1 07/31/2017 0947      Component Value Date/Time   TSH 0.695 10/23/2017 0953   TSH 1.04 07/31/2017 0947   TSH 0.41 06/19/2017 1544   Results for DAYAN, DESA (MRN [id number]) as of 12/05/2017 09:44  Ref. Range 10/23/2017 09:53  Vitamin D, 25-Hydroxy Latest Ref Range: 30.0 - 100.0 ng/mL 38.4   ASSESSMENT AND PLAN: Type 2 diabetes mellitus with hyperglycemia, without long-term current use of insulin (HCC)  Vitamin D deficiency  Class 1 obesity with serious comorbidity and body mass index (BMI) of 33.0 to 33.9 in adult, unspecified obesity type  PLAN:  Diabetes II without insulin use with hyperglycemia Breyon has been given extensive diabetes education by myself today including ideal fasting and post-prandial blood glucose readings, individual ideal Hgb A1c goals, and hypoglycemia prevention. We discussed the importance of good blood sugar control to decrease the likelihood of diabetic complications such as nephropathy, neuropathy, limb loss, blindness, coronary artery disease, and death. We discussed the importance of intensive lifestyle modification including diet,  exercise and weight loss as the first line treatment for diabetes. We will repeat labs in early January. Jaimere agrees to follow up at the agreed upon time in 2 weeks.  Vitamin D Deficiency [place] was informed that low vitamin D levels contributes to fatigue and are associated with obesity, breast, and colon cancer. He agrees to continue to take prescription Vit D @50 ,000 IU every week with no refills needed and will follow up for routine testing of vitamin D, at least 2-3 times per year. He was informed of the risk of over-replacement of vitamin D and agrees to not increase his dose unless he discusses this with [name] first. Almir agrees to follow up as directed.  I spent > than 50% of the 15 minute visit on counseling as documented in the note.  Obesity Halden is currently in the action stage of change. As such, his goal is to continue with weight loss efforts. He has agreed to follow the Category 3 plan. Griselda has been instructed to work up to a goal of 150 minutes of combined cardio and strengthening exercise per week for weight loss and overall health benefits. We discussed the following Behavioral Modification Strategies today: increasing lean protein intake, increasing vegetables, work on meal planning and easy cooking plans, and planning for success.  Kaydin has agreed to follow up with our clinic in 2 weeks. He was informed of the importance of frequent follow up visits to maximize his success with intensive lifestyle modifications for his multiple health conditions.   OBESITY BEHAVIORAL INTERVENTION VISIT  Today's visit was # 4   Starting weight: 245 lbs Starting date: 10/22/17 Today's weight : Weight: 234 lb (106.1 kg)  Today's date: 12/04/2017 Total lbs lost to date: 11  ASK: We discussed the diagnosis of obesity with Shirlean Schlein today and Legrand Como agreed to give [name] permission to discuss obesity behavioral modification therapy today.  ASSESS: Olivier has the diagnosis  of obesity and his  BMI today is 33.58. Adriann is in the action stage of change.   ADVISE: Ayeden was educated on the multiple health risks of obesity as well as the benefit of weight loss to improve his health. He was advised of the need for long term treatment and the importance of lifestyle modifications to improve his current health and to decrease his risk of future health problems.  AGREE: Multiple dietary modification options and treatment options were discussed and Azeez agreed to follow the recommendations documented in the above note.  ARRANGE: Lawyer was educated on the importance of frequent visits to treat obesity as outlined per CMS and USPSTF guidelines and agreed to schedule his next follow up appointment today.  I, Marcille Blanco, am acting as Location manager for Eber Jones, MD  I have reviewed the above documentation for accuracy and completeness, and I agree with the above. - Ilene Qua, MD

## 2017-12-18 ENCOUNTER — Ambulatory Visit (INDEPENDENT_AMBULATORY_CARE_PROVIDER_SITE_OTHER): Payer: BLUE CROSS/BLUE SHIELD | Admitting: Bariatrics

## 2017-12-18 ENCOUNTER — Encounter (INDEPENDENT_AMBULATORY_CARE_PROVIDER_SITE_OTHER): Payer: Self-pay | Admitting: Bariatrics

## 2017-12-18 VITALS — BP 116/70 | HR 73 | Temp 98.8°F | Ht 70.0 in | Wt 228.0 lb

## 2017-12-18 DIAGNOSIS — E1165 Type 2 diabetes mellitus with hyperglycemia: Secondary | ICD-10-CM

## 2017-12-18 DIAGNOSIS — E559 Vitamin D deficiency, unspecified: Secondary | ICD-10-CM | POA: Diagnosis not present

## 2017-12-18 DIAGNOSIS — E669 Obesity, unspecified: Secondary | ICD-10-CM

## 2017-12-18 DIAGNOSIS — Z6832 Body mass index (BMI) 32.0-32.9, adult: Secondary | ICD-10-CM | POA: Diagnosis not present

## 2017-12-21 ENCOUNTER — Encounter (INDEPENDENT_AMBULATORY_CARE_PROVIDER_SITE_OTHER): Payer: Self-pay | Admitting: Bariatrics

## 2017-12-21 DIAGNOSIS — E669 Obesity, unspecified: Secondary | ICD-10-CM | POA: Insufficient documentation

## 2017-12-21 DIAGNOSIS — Z6832 Body mass index (BMI) 32.0-32.9, adult: Secondary | ICD-10-CM

## 2017-12-21 NOTE — Progress Notes (Signed)
Office: [phone]  /  Fax: [phone]   HPI:   Chief Complaint: OBESITY Thomas [name] is here to discuss his progress with his obesity treatment plan. He is on the Category 3 plan and is following his eating plan approximately 95 to 98 % of the time. He states he is walking 30 to 40 minutes 3 to 4 times per week. Thomas [name] is doing well with the Category 3 plan. He is occasionally struggling with his hunger in the evening. His weight is 228 lb (103.4 kg) today and has had a weight loss of 6 pounds over a period of 2 weeks since his last visit. He has lost 17 lbs since starting treatment with [name].  Diabetes II with hyperglycemia, without insulin Thomas [name] has a diagnosis of diabetes type II. He is not on medications. Thomas [name] states fasting BGs range between 90 and 130's, lowest was 91 and 2 hour post prandial blood sugar was at 113. Thomas [name] denies any hypoglycemic episodes. Last A1c was at 6.5 He has been working on intensive lifestyle modifications including diet, exercise, and weight loss to help control his blood glucose levels.  Vitamin D deficiency Thomas [name] has a diagnosis of vitamin D deficiency. He is currently taking high dose prescription vit D and his last vitamin D level was at 38.4. Thomas [name] denies nausea, vomiting or muscle weakness.  ALLERGIES: Allergies  Allergen Reactions  . No Known Allergies     MEDICATIONS: Current Outpatient Medications on File Prior to Visit  Medication Sig Dispense Refill  . albuterol (PROAIR HFA) 108 (90 Base) MCG/ACT inhaler INHALE 2 PUFFS EVERY 6 HOURS AS NEEDED FOR WHEEZING OR SHORTNESS OF BREATH. 3 Inhaler 3  . albuterol (PROVENTIL) (2.5 MG/3ML) 0.083% nebulizer solution Take 3 mLs (2.5 mg total) by nebulization every 4 (four) hours as needed for wheezing or shortness of breath. 75 mL 5  . budesonide-formoterol (SYMBICORT) 160-4.5 MCG/ACT inhaler Inhale 2 puffs into the lungs 2 (two) times daily. 1 Inhaler 6  . fluticasone (FLONASE) 50 MCG/ACT nasal  spray Place 2 sprays into both nostrils daily. 48 g 3  . hyoscyamine (LEVBID) 0.375 MG 12 hr tablet Take 0.375 mg by mouth daily as needed for cramping.   11  . lansoprazole (PREVACID) 15 MG capsule Take 15 mg by mouth daily.     . montelukast (SINGULAIR) 10 MG tablet Take 1 tablet (10 mg total) by mouth at bedtime. 30 tablet 2  . tiZANidine (ZANAFLEX) 4 MG capsule Take 4 mg by mouth 3 (three) times daily as needed for muscle spasms.    . Vitamin D, Ergocalciferol, (DRISDOL) 50000 units CAPS capsule Take 1 capsule (50,000 Units total) by mouth every 7 (seven) days. 12 capsule 0   No current facility-administered medications on file prior to visit.     PAST MEDICAL HISTORY: Past Medical History:  Diagnosis Date  . Allergy   . Asthma   . Back pain   . Diabetes ([place])   . Dyspnea   . GERD (gastroesophageal reflux disease)   . History of hiatal hernia   . Pneumonia    5 -7 yrs ago  . Swallowing difficulty     PAST SURGICAL HISTORY: Past Surgical History:  Procedure Laterality Date  . ANTERIOR CERVICAL DECOMP/DISCECTOMY FUSION N/A 07/14/2016   Procedure: Cervical five-six5- Cervical six-seven Anterior cervical decompression/discectomy/fusion;  Surgeon: Erline Levine, MD;  Location: [facility];  Service: Neurosurgery;  Laterality: N/A;  . COLON SURGERY    . COLONOSCOPY  12/22/2014   per Dr. Earlean Shawl,  clear, repeat in 5 yrs (hx of adenomas)   . DG THUMB LEFT HAND    . DG THUMB RIGHT HAND ([place] HX)    . FUNCTIONAL ENDOSCOPIC SINUS SURGERY    . PARTIAL COLECTOMY     sigmoid, Dr. Earlean Shawl    SOCIAL HISTORY: Social History   Tobacco Use  . Smoking status: Former Smoker    Packs/day: 1.00    Years: 10.00    Pack years: 10.00    Types: Cigarettes    Last attempt to quit: 01/31/1986    Years since quitting: 31.9  . Smokeless tobacco: Never Used  Substance Use Topics  . Alcohol use: Yes    Comment: rarely  . Drug use: No    FAMILY HISTORY: Family History  Problem Relation Age of Onset   . Cancer Mother   . Cancer Father   . Hyperlipidemia Father   . Diabetes Unknown        maternal family    ROS: Review of Systems  Constitutional: Positive for malaise/fatigue.  Gastrointestinal: Negative for nausea and vomiting.  Musculoskeletal:       Negative for muscle weakness  Endo/Heme/Allergies:       + hyperglycemia    PHYSICAL EXAM: Blood pressure 116/70, pulse 73, temperature 98.8 F (37.1 C), temperature source Oral, height 5\' 10"  (1.778 m), weight 228 lb (103.4 kg), SpO2 98 %. Body mass index is 32.71 kg/m. Physical Exam  Constitutional: He is oriented to person, place, and time. He appears well-developed and well-nourished.  Cardiovascular: Normal rate.  Pulmonary/Chest: Effort normal.  Musculoskeletal: Normal range of motion.  Neurological: He is oriented to person, place, and time.  Skin: Skin is warm and dry.  Psychiatric: He has a normal mood and affect. His behavior is normal.  Vitals reviewed.   RECENT LABS AND TESTS: BMET    Component Value Date/Time   NA 139 10/23/2017 0953   K 4.4 10/23/2017 0953   CL 99 10/23/2017 0953   CO2 24 10/23/2017 0953   GLUCOSE 114 (H) 10/23/2017 0953   GLUCOSE 126 (H) 07/31/2017 0947   BUN 12 10/23/2017 0953   CREATININE 1.00 10/23/2017 0953   CREATININE 1.05 04/24/2015 1428   CALCIUM 9.5 10/23/2017 0953   GFRNONAA 82 10/23/2017 0953   GFRNONAA 79 04/24/2015 1428   GFRAA 95 10/23/2017 0953   GFRAA >89 04/24/2015 1428   Lab Results  Component Value Date   HGBA1C 6.5 (H) 10/23/2017   HGBA1C 8.6 (H) 07/07/2017   Lab Results  Component Value Date   INSULIN 14.1 10/23/2017   CBC    Component Value Date/Time   WBC 8.2 10/23/2017 0953   WBC 6.6 07/31/2017 0947   RBC 4.85 10/23/2017 0953   RBC 4.78 07/31/2017 0947   HGB 16.0 10/23/2017 0953   HCT 42.6 10/23/2017 0953   PLT 345.0 07/31/2017 0947   MCV 88 10/23/2017 0953   MCH 33.0 10/23/2017 0953   MCH 30.6 07/11/2016 1211   MCHC 37.6 (H) 10/23/2017  0953   MCHC 34.9 07/31/2017 0947   RDW 13.4 10/23/2017 0953   LYMPHSABS 2.2 10/23/2017 0953   MONOABS 0.4 07/31/2017 0947   EOSABS 0.5 (H) 10/23/2017 0953   BASOSABS 0.0 10/23/2017 0953   Iron/TIBC/Ferritin/ %Sat No results found for: IRON, TIBC, FERRITIN, IRONPCTSAT Lipid Panel     Component Value Date/Time   CHOL 143 10/23/2017 0953   TRIG 64 10/23/2017 0953   HDL 36 (L) 10/23/2017 0953   CHOLHDL 5 07/31/2017 0947  VLDL 20.4 07/31/2017 0947   LDLCALC 94 10/23/2017 0953   Hepatic Function Panel     Component Value Date/Time   PROT 6.4 10/23/2017 0953   ALBUMIN 4.2 10/23/2017 0953   AST 18 10/23/2017 0953   ALT 29 10/23/2017 0953   ALKPHOS 110 10/23/2017 0953   BILITOT 0.6 10/23/2017 0953   BILIDIR 0.1 07/31/2017 0947      Component Value Date/Time   TSH 0.695 10/23/2017 0953   TSH 1.04 07/31/2017 0947   TSH 0.41 06/19/2017 1544   Results for DARRIK, RICHMAN (MRN [id number]) as of 12/21/2017 10:55  Ref. Range 10/23/2017 09:53  Vitamin D, 25-Hydroxy Latest Ref Range: 30.0 - 100.0 ng/mL 38.4   ASSESSMENT AND PLAN: Type 2 diabetes mellitus with hyperglycemia, without long-term current use of insulin (HCC)  Vitamin D deficiency  Class 1 obesity with serious comorbidity and body mass index (BMI) of 32.0 to 32.9 in adult, unspecified obesity type  PLAN:  Diabetes II with hyperglycemia, without insulin Thomas [name] has been given extensive diabetes education by myself today including ideal fasting and post-prandial blood glucose readings, individual ideal Hgb A1c goals and hypoglycemia prevention. We discussed the importance of good blood sugar control to decrease the likelihood of diabetic complications such as nephropathy, neuropathy, limb loss, blindness, coronary artery disease, and death. We discussed the importance of intensive lifestyle modification including diet, exercise and weight loss as the first line treatment for diabetes. Thomas [name] agrees to continue his diabetes  medications and will follow up at the agreed upon time.  Vitamin D Deficiency Thomas [name] was informed that low vitamin D levels contributes to fatigue and are associated with obesity, breast, and colon cancer. He agrees to continue taking high dose prescription Vit D @50 ,000 IU every week and will follow up for routine testing of vitamin D, at least 2-3 times per year. He was informed of the risk of over-replacement of vitamin D and agrees to not increase his dose unless he discusses this with [name] first.  I spent > than 50% of the 15 minute visit on counseling as documented in the note.  Obesity Thomas [name] is currently in the action stage of change. As such, his goal is to continue with weight loss efforts He has agreed to follow the Category 3 plan Thomas [name] will begin resistance training and will continue walking 30 to 40 minutes 3 to 4 times per week for weight loss and overall health benefits. We discussed the following Behavioral Modification Strategies today: increase H2O intake, no skipping meals, increasing lean protein intake, decreasing simple carbohydrates, increasing vegetables and work on meal planning and easy cooking plans Thomas [name] will move his food around if needed later in the day.  Thomas [name] has agreed to follow up with our clinic in 2 weeks. He was informed of the importance of frequent follow up visits to maximize his success with intensive lifestyle modifications for his multiple health conditions.   OBESITY BEHAVIORAL INTERVENTION VISIT  Today's visit was # 5   Starting weight: 245 lbs Starting date: 10/22/2017 Today's weight : 228 lbs  Today's date: 12/18/2017 Total lbs lost to date: 17   ASK: We discussed the diagnosis of obesity with Thomas [name] today and Thomas [name] agreed to give [name] permission to discuss obesity behavioral modification therapy today.  ASSESS: Thomas [name] has the diagnosis of obesity and his BMI today is 32.71 Thomas [name] is in the action stage of change    ADVISE: Thomas [name] was educated on the multiple health risks of obesity as  well as the benefit of weight loss to improve his health. He was advised of the need for long term treatment and the importance of lifestyle modifications to improve his current health and to decrease his risk of future health problems.  AGREE: Multiple dietary modification options and treatment options were discussed and  Thomas [name] agreed to follow the recommendations documented in the above note.  ARRANGE: Thomas [name] was educated on the importance of frequent visits to treat obesity as outlined per CMS and USPSTF guidelines and agreed to schedule his next follow up appointment today.  Corey Skains, am acting as Location manager for [employer]. Owens Shark, DO  I have reviewed the above documentation for accuracy and completeness, and I agree with the above. -Jearld Lesch, DO

## 2018-01-01 ENCOUNTER — Ambulatory Visit (INDEPENDENT_AMBULATORY_CARE_PROVIDER_SITE_OTHER): Payer: BLUE CROSS/BLUE SHIELD | Admitting: Bariatrics

## 2018-01-01 ENCOUNTER — Encounter (INDEPENDENT_AMBULATORY_CARE_PROVIDER_SITE_OTHER): Payer: Self-pay | Admitting: Bariatrics

## 2018-01-01 ENCOUNTER — Ambulatory Visit: Payer: BLUE CROSS/BLUE SHIELD | Admitting: Family Medicine

## 2018-01-01 VITALS — BP 152/85 | HR 86 | Temp 98.1°F | Ht 70.0 in | Wt 330.0 lb

## 2018-01-01 DIAGNOSIS — E669 Obesity, unspecified: Secondary | ICD-10-CM

## 2018-01-01 DIAGNOSIS — E559 Vitamin D deficiency, unspecified: Secondary | ICD-10-CM

## 2018-01-01 DIAGNOSIS — E1165 Type 2 diabetes mellitus with hyperglycemia: Secondary | ICD-10-CM | POA: Diagnosis not present

## 2018-01-01 DIAGNOSIS — Z6833 Body mass index (BMI) 33.0-33.9, adult: Secondary | ICD-10-CM

## 2018-01-01 DIAGNOSIS — Z9189 Other specified personal risk factors, not elsewhere classified: Secondary | ICD-10-CM | POA: Diagnosis not present

## 2018-01-01 DIAGNOSIS — B370 Candidal stomatitis: Secondary | ICD-10-CM

## 2018-01-01 DIAGNOSIS — E66811 Obesity, class 1: Secondary | ICD-10-CM

## 2018-01-01 MED ORDER — NYSTATIN 100000 UNIT/ML MT SUSP: 5.0000 mL | Freq: Four times a day (QID) | OROMUCOSAL | 1 refills | Status: DC

## 2018-01-04 ENCOUNTER — Encounter: Payer: Self-pay | Admitting: Pulmonary Disease

## 2018-01-04 ENCOUNTER — Ambulatory Visit (INDEPENDENT_AMBULATORY_CARE_PROVIDER_SITE_OTHER): Payer: BLUE CROSS/BLUE SHIELD | Admitting: Pulmonary Disease

## 2018-01-04 VITALS — BP 128/72 | HR 73 | Ht 70.0 in | Wt 231.0 lb

## 2018-01-04 DIAGNOSIS — R0602 Shortness of breath: Secondary | ICD-10-CM

## 2018-01-04 DIAGNOSIS — J4550 Severe persistent asthma, uncomplicated: Secondary | ICD-10-CM

## 2018-01-04 LAB — PULMONARY FUNCTION TEST
DL/VA % pred: 114 %
DL/VA: 5.38 ml/min/mmHg/L
DLCO UNC % PRED: 114 %
DLCO UNC: 38.46 ml/min/mmHg
FEF 25-75 PRE: 5.49 L/s
FEF 25-75 Post: 5.58 L/sec
FEF2575-%Change-Post: 1 %
FEF2575-%PRED-POST: 182 %
FEF2575-%PRED-PRE: 180 %
FEV1-%Change-Post: 0 %
FEV1-%PRED-POST: 133 %
FEV1-%Pred-Pre: 132 %
FEV1-POST: 4.38 L
FEV1-Pre: 4.34 L
FEV1FVC-%CHANGE-POST: 1 %
FEV1FVC-%Pred-Pre: 107 %
FEV6-%Change-Post: 0 %
FEV6-%PRED-POST: 125 %
FEV6-%Pred-Pre: 126 %
FEV6-Post: 5.07 L
FEV6-Pre: 5.1 L
FEV6FVC-%CHANGE-POST: 0 %
FEV6FVC-%Pred-Post: 102 %
FEV6FVC-%Pred-Pre: 102 %
FVC-%CHANGE-POST: 0 %
FVC-%Pred-Post: 122 %
FVC-%Pred-Pre: 122 %
FVC-Post: 5.11 L
FVC-Pre: 5.13 L
POST FEV1/FVC RATIO: 86 %
POST FEV6/FVC RATIO: 99 %
PRE FEV1/FVC RATIO: 85 %
Pre FEV6/FVC Ratio: 99 %
RV % PRED: -17 %
RV: -0.4 L
TLC % pred: 66 %
TLC: 4.75 L

## 2018-01-04 NOTE — Patient Instructions (Signed)
I am glad that your breathing is stabilized with Symbicort and Singulair We will continue the current regimen for now Follow-up in 6 months  Please give [name] a call sooner if there is any change in his symptoms.

## 2018-01-04 NOTE — Progress Notes (Signed)
Thomas [name]    [id number]    21-Jul-1960  Primary Care Physician:Fry, Ishmael Holter, MD  Referring Physician: Laurey Morale, MD [place], [place] [address]  Chief complaint: Follow up for severe persistent asthma, nasal polyposis, chronic sinusitis.  HPI: 57 year old with history of allergies, asthma, diabetes, GERD, dyspnea Complains of occasional dyspnea, productive cough with yellow mucus and wheezing for the past 1 year.  Seen by his primary care on 10/14 and given Z-Pak and referred to pulmonary.  Maintained on Qvar and albuterol.  He is using albuterol up to 3 times a day, has daily nighttime awakening Follows with Dr. 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   Underwent FEES in April 2019.  He saw Dr. Erik [name] today who got sinus cultures showing coag negative staph.  Continues on Flonase. He was on aspirin for chronic pain but is not using it anymore.  Pets: Used to have a dog, no cats, birds, farm animals Occupation: Works as a Administrator for [employer], Theme park manager Exposures: No known exposures, mold mold, hot tub, Jacuzzi Smoking history: 10-pack-year smoker.  Quit in 1990 Travel history: Drives to [place] as a Administrator.  No other significant travel. Relevant family history: No significant family history of lung disease.  Interim history: Started on Symbicort and Singulair at last visit.  States that this has improved his breathing a lot He is started exercising by walking again.  Hardly needs to use his rescue inhaler. No nocturnal awakenings.  Outpatient Encounter Medications as of 01/04/2018  Medication Sig  . albuterol (PROAIR HFA) 108 (90 Base) MCG/ACT inhaler INHALE 2 PUFFS EVERY 6 HOURS AS NEEDED FOR WHEEZING OR SHORTNESS OF BREATH.  Marland Kitchen albuterol (PROVENTIL) (2.5 MG/3ML) 0.083% nebulizer solution Take 3 mLs (2.5 mg total) by nebulization every 4 (four) hours as needed for wheezing or shortness of breath.  .  budesonide-formoterol (SYMBICORT) 160-4.5 MCG/ACT inhaler Inhale 2 puffs into the lungs 2 (two) times daily.  . fluticasone (FLONASE) 50 MCG/ACT nasal spray Place 2 sprays into both nostrils daily.  . hyoscyamine (LEVBID) 0.375 MG 12 hr tablet Take 0.375 mg by mouth daily as needed for cramping.   . lansoprazole (PREVACID) 15 MG capsule Take 15 mg by mouth daily.   . montelukast (SINGULAIR) 10 MG tablet Take 1 tablet (10 mg total) by mouth at bedtime.  Marland Kitchen nystatin (MYCOSTATIN) 100000 UNIT/ML suspension Take 5 mLs (500,000 Units total) by mouth 4 (four) times daily.  Marland Kitchen tiZANidine (ZANAFLEX) 4 MG capsule Take 4 mg by mouth 3 (three) times daily as needed for muscle spasms.  . Vitamin D, Ergocalciferol, (DRISDOL) 50000 units CAPS capsule Take 1 capsule (50,000 Units total) by mouth every 7 (seven) days.   No facility-administered encounter medications on file as of 01/04/2018.    Physical Exam: Blood pressure 128/64, pulse 74, height 5' 9.5" (1.765 m), weight 240 lb 9.6 oz (109.1 kg), SpO2 98 %. Gen:      No acute distress HEENT:  EOMI, sclera anicteric Neck:     Drainage at the back of the throat Lungs:    Bilateral expiratory wheeze, scattered crackles. CV:         Regular rate and rhythm; no murmurs Abd:      + bowel sounds; soft, non-tender; no palpable masses, no distension Ext:    No edema; adequate peripheral perfusion Skin:      Warm and dry; no rash Neuro: alert and  oriented x 3 Psych: normal mood and affect  Data Reviewed: Imaging: Chest x-ray 08/12/2017- chronic bronchitic changes, no acute cardiopulmonary ab normalities.  I have reviewed the images personally.  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  PFTs show mild restriction but there is no evidence of interstitial lung disease on chest x-ray.  Symptoms have stabilized with Symbicort, Singulair. Continue current therapy. Advised him to avoid aspirin and NSAIDs   Plan/Recommendations: - Continue Symbicort, Singulair. - Albuterol rescue inhaler  Thomas Garfinkel MD [facility] 01/04/2018, 2:18 PM  CC: Thomas Morale, MD

## 2018-01-04 NOTE — Progress Notes (Signed)
PFT done today. 

## 2018-01-04 NOTE — Progress Notes (Signed)
Office: [phone]  /  Fax: [phone]   HPI:   Chief Complaint: OBESITY Thomas [name] is here to discuss his progress with his obesity treatment plan. He is following the Category 3 plan and is following his eating plan approximately 90 % of the time. He states he is walking 30-40 minutes 3-4 times per week. Thomas [name] has struggled some during the holidays. He has been traveling and doing less planning and more eating out. He has been taking Clindamycin for a month.   His weight is (!) 330 lb (149.7 kg) today and had a weight gain of 2 lbs since his last visit. He has lost 15 lbs since starting treatment with [name].  Diabetes [name] with hyperglycemia on [name] Thomas [name]. Thomas [name]. Last A1c was Hemoglobin A1C Latest Ref Rng & Units 10/23/2017 07/07/2017  HGBA1C 4.8 - 5.6 % 6.5(H) 8.6(H)  Some recent data might be hidden    He has been working on intensive lifestyle modifications including diet, exercise, and weight loss to help control his blood glucose levels.  Vitamin D deficiency Thomas [name] has a diagnosis of vitamin D deficiency. He is currently taking prescription Vit D and denies nausea, vomiting or muscle weakness  At risk for osteopenia and osteoporosis Thomas [name] is at higher risk of osteopenia and osteoporosis due to vitamin D deficiency.   Thomas Thomas [name] and the back of his throat. He said that he has a slightly sore throat but he is still able to eat and drink.   ALLERGIES: Allergies  Allergen Reactions  . No Known Allergies     [name]: Current Outpatient [name] on File Prior to Visit  Medication Sig Dispense Refill  . albuterol (PROAIR HFA) 108 (90 Base) MCG/ACT inhaler INHALE 2 PUFFS EVERY 6 HOURS AS NEEDED FOR WHEEZING OR SHORTNESS OF BREATH. 3 Inhaler 3  . albuterol (PROVENTIL) (2.5 MG/3ML) 0.083% nebulizer solution Take 3 mLs (2.5 mg total) by nebulization  every 4 (four) hours as needed for wheezing or shortness of breath. 75 mL 5  . budesonide-formoterol (SYMBICORT) 160-4.5 MCG/ACT inhaler Inhale 2 puffs into the lungs 2 (two) times daily. 1 Inhaler 6  . fluticasone (FLONASE) 50 MCG/ACT nasal spray Place 2 sprays into both nostrils daily. 48 g 3  . hyoscyamine (LEVBID) 0.375 MG 12 hr tablet Take 0.375 mg by mouth daily as needed for cramping.   11  . lansoprazole (PREVACID) 15 MG capsule Take 15 mg by mouth daily.     . montelukast (SINGULAIR) 10 MG tablet Take 1 tablet (10 mg total) by mouth at bedtime. 30 tablet 2  . tiZANidine (ZANAFLEX) 4 MG capsule Take 4 mg by mouth 3 (three) times daily as needed for muscle spasms.    . Vitamin D, Ergocalciferol, (DRISDOL) 50000 units CAPS capsule Take 1 capsule (50,000 Units total) by mouth every 7 (seven) days. 12 capsule 0   No current facility-administered [name] on file prior to visit.     PAST MEDICAL HISTORY: Past Medical History:  Diagnosis Date  . Allergy   . Asthma   . Back pain   . Diabetes ([place])   . Dyspnea   . GERD (gastroesophageal reflux disease)   . History of hiatal hernia   . Pneumonia    5 -7 yrs ago  . Swallowing difficulty     PAST SURGICAL HISTORY: Past Surgical History:  Procedure Laterality Date  . ANTERIOR CERVICAL DECOMP/DISCECTOMY FUSION  N/A 07/14/2016   Procedure: Cervical five-six5- Cervical six-seven Anterior cervical decompression/discectomy/fusion;  Surgeon: Erline Levine, MD;  Location: [facility];  Service: Neurosurgery;  Laterality: N/A;  . COLON SURGERY    . COLONOSCOPY  12/22/2014   per Dr. Earlean Shawl, clear, repeat in 5 yrs (hx of adenomas)   . DG THUMB LEFT HAND    . DG THUMB RIGHT HAND ([place] HX)    . FUNCTIONAL ENDOSCOPIC SINUS SURGERY    . PARTIAL COLECTOMY     sigmoid, Dr. Earlean Shawl    SOCIAL HISTORY: Social History   Tobacco Use  . Smoking status: Former Smoker    Packs/day: 1.00    Years: 10.00    Pack years: 10.00    Types: Cigarettes     Last attempt to quit: 01/31/1986    Years since quitting: 31.9  . Smokeless tobacco: Never Used  Substance Use Topics  . Alcohol use: Yes    Comment: rarely  . Drug use: No    FAMILY HISTORY: Family History  Problem Relation Age of Onset  . Cancer Mother   . Cancer Father   . Hyperlipidemia Father   . Diabetes Unknown        maternal family    ROS: Review of Systems  Constitutional: Negative for weight loss.  Gastrointestinal: Negative for nausea and vomiting.  Musculoskeletal:       Negative for muscle weakness  Endo/Heme/Allergies:       Negative for hypoglycemic episodes Positive for hyperglycemic episodes    PHYSICAL EXAM: Blood pressure (!) 152/85, pulse 86, temperature 98.1 F (36.7 C), temperature source Oral, height 5\' 10"  (1.778 m), weight (!) 330 lb (149.7 kg), SpO2 96 %. Body mass index is 47.35 kg/m. Physical Exam  Constitutional: He is oriented to person, place, and time. He appears well-developed and well-nourished.  Cardiovascular: Normal rate.  Pulmonary/Chest: Effort normal.  Musculoskeletal: Normal range of motion.  Neurological: He is alert and oriented to person, place, and time.  Skin: Skin is warm and dry.  Psychiatric: He has a normal mood and affect. His behavior is normal.  Vitals reviewed.   RECENT LABS AND TESTS: BMET    Component Value Date/Time   NA 139 10/23/2017 0953   K 4.4 10/23/2017 0953   CL 99 10/23/2017 0953   CO2 24 10/23/2017 0953   GLUCOSE 114 (H) 10/23/2017 0953   GLUCOSE 126 (H) 07/31/2017 0947   BUN 12 10/23/2017 0953   CREATININE 1.00 10/23/2017 0953   CREATININE 1.05 04/24/2015 1428   CALCIUM 9.5 10/23/2017 0953   GFRNONAA 82 10/23/2017 0953   GFRNONAA 79 04/24/2015 1428   GFRAA 95 10/23/2017 0953   GFRAA >89 04/24/2015 1428   Lab Results  Component Value Date   HGBA1C 6.5 (H) 10/23/2017   HGBA1C 8.6 (H) 07/07/2017   Lab Results  Component Value Date   [name] 14.1 10/23/2017   CBC    Component  Value Date/Time   WBC 8.2 10/23/2017 0953   WBC 6.6 07/31/2017 0947   RBC 4.85 10/23/2017 0953   RBC 4.78 07/31/2017 0947   HGB 16.0 10/23/2017 0953   HCT 42.6 10/23/2017 0953   PLT 345.0 07/31/2017 0947   MCV 88 10/23/2017 0953   MCH 33.0 10/23/2017 0953   MCH 30.6 07/11/2016 1211   MCHC 37.6 (H) 10/23/2017 0953   MCHC 34.9 07/31/2017 0947   RDW 13.4 10/23/2017 0953   LYMPHSABS 2.2 10/23/2017 0953   MONOABS 0.4 07/31/2017 0947   EOSABS 0.5 (H)  10/23/2017 0953   BASOSABS 0.0 10/23/2017 0953   Iron/TIBC/Ferritin/ %Sat No results found for: IRON, TIBC, FERRITIN, IRONPCTSAT Lipid Panel     Component Value Date/Time   CHOL 143 10/23/2017 0953   TRIG 64 10/23/2017 0953   HDL 36 (L) 10/23/2017 0953   CHOLHDL 5 07/31/2017 0947   VLDL 20.4 07/31/2017 0947   LDLCALC 94 10/23/2017 0953   Hepatic Function Panel     Component Value Date/Time   PROT 6.4 10/23/2017 0953   ALBUMIN 4.2 10/23/2017 0953   AST 18 10/23/2017 0953   ALT 29 10/23/2017 0953   ALKPHOS 110 10/23/2017 0953   BILITOT 0.6 10/23/2017 0953   BILIDIR 0.1 07/31/2017 0947      Component Value Date/Time   TSH 0.695 10/23/2017 0953   TSH 1.04 07/31/2017 0947   TSH 0.41 06/19/2017 1544   Results for CHAYSEN, TILLMAN (MRN [id number]) as of 01/04/2018 09:29  Ref. Range 10/23/2017 09:53  Vitamin D, 25-Hydroxy Latest Ref Range: 30.0 - 100.0 ng/mL 38.4   ASSESSMENT AND PLAN: Type 2 diabetes mellitus with hyperglycemia, without long-term current use of [name] (HCC)  Vitamin D deficiency  Thrush  At risk for osteoporosis  Class 1 obesity with serious comorbidity and body mass index (BMI) of 33.0 to 33.9 in adult, unspecified obesity type  PLAN: Diabetes [name] with hyperglycemia on [name] Zachariah has been given extensive diabetes [name] by myself today including ideal fasting and post-prandial blood glucose readings, individual ideal HgA1c goals  and hypoglycemia prevention. We discussed the importance of good  blood sugar control to decrease the likelihood of diabetic complications such as nephropathy, neuropathy, limb loss, blindness, coronary artery disease, and death. We discussed the importance of intensive lifestyle modification including diet, exercise and weight loss as the first line treatment for diabetes. Tasman agrees to continue his diabetes [name], decrease carbohydrates and increase protein. Deontrey agrees to follow up with our office in 2 weeks.   Vitamin D Deficiency Thomas [name] was informed that low vitamin D levels contributes to fatigue and are associated with obesity, breast, and colon cancer. He agrees to continue taking prescription Vit D @50 ,000 IU every week and will follow up for routine testing of vitamin D, at least 2-3 times per year. He was informed of the risk of over-replacement of vitamin D and agrees to not increase his dose unless he discusses this with [name] first. Wojciech agrees to follow up with our office in 2 weeks.   At risk for osteopenia and osteoporosis Thomas [name] was given extended  (15 minutes) osteoporosis prevention counseling today. Thomas [name] is at risk for osteopenia and osteoporsis due to his vitamin D deficiency. He was encouraged to take his vitamin D and follow his higher calcium diet and increase strengthening exercise to help strengthen his bones and decrease his risk of osteopenia and osteoporosis.Thomas [name] agrees to follow up with our office in 2 weeks.   Sivan Quast was informed of the diagnosis of thrush. We will prescribe Nystatin oral suspension 5cc for 4 days. 60cc with 1 refill. Thomas [name] agrees to follow up with our office in 2 weeks.   Obesity Terreon is currently in the action stage of change. As such, his goal is to continue with weight loss efforts He has agreed to follow the Category 3 plan Thomas [name] has been instructed to work up to a goal of 150 minutes of combined cardio and strengthening exercise per week for weight loss and overall health  benefits. We discussed the following Behavioral Modification Stratagies  today: increasing lean protein intake, increase H2O, celebration eating strategies, decreasing simple carbohydrates , increasing vegetables and decrease liquid calories  Rueben has agreed to follow up with our clinic in 2 weeks. He was informed of the importance of frequent follow up visits to maximize his success with intensive lifestyle modifications for his multiple health conditions.   OBESITY BEHAVIORAL INTERVENTION VISIT  Today's visit was # 6   Starting weight: 245 lbs Starting date: 10/22/2017 Today's weight : Weight: (!) 330 lb (149.7 kg)  Today's date: 12/02/2017 Total lbs lost to date: 15 lbs At least 15 minutes were spent on discussing the following behavioral intervention visit.   ASK: We discussed the diagnosis of obesity with Shirlean Schlein today and Beckett agreed to give [name] permission to discuss obesity behavioral modification therapy today.  ASSESS: Lazarus has the diagnosis of obesity and his BMI today is 47.35 Hawken is in the action stage of change   ADVISE: Suhan was educated on the multiple health risks of obesity as well as the benefit of weight loss to improve his health. He was advised of the need for long term treatment and the importance of lifestyle modifications to improve his current health and to decrease his risk of future health problems.  AGREE: Multiple dietary modification options and treatment options were discussed and  Edge agreed to follow the recommendations documented in the above note.  ARRANGE: Trayson was educated on the importance of frequent visits to treat obesity as outlined per CMS and USPSTF guidelines and agreed to schedule his next follow up appointment today.  I, Remi Deter, CMA, am acting as Location manager for [employer], DO.   I have reviewed the above documentation for accuracy and completeness, and I agree with the above. -Jearld Lesch,  DO  I have reviewed the above documentation for accuracy and completeness, and I agree with the above. -Jearld Lesch, DO

## 2018-01-15 ENCOUNTER — Encounter (INDEPENDENT_AMBULATORY_CARE_PROVIDER_SITE_OTHER): Payer: Self-pay | Admitting: Bariatrics

## 2018-01-15 ENCOUNTER — Ambulatory Visit (INDEPENDENT_AMBULATORY_CARE_PROVIDER_SITE_OTHER): Payer: BLUE CROSS/BLUE SHIELD | Admitting: Bariatrics

## 2018-01-15 VITALS — BP 149/76 | HR 73 | Temp 98.0°F | Ht 70.0 in | Wt 230.0 lb

## 2018-01-15 DIAGNOSIS — Z6832 Body mass index (BMI) 32.0-32.9, adult: Secondary | ICD-10-CM

## 2018-01-15 DIAGNOSIS — Z794 Long term (current) use of insulin: Secondary | ICD-10-CM | POA: Diagnosis not present

## 2018-01-15 DIAGNOSIS — E669 Obesity, unspecified: Secondary | ICD-10-CM | POA: Diagnosis not present

## 2018-01-15 DIAGNOSIS — E1165 Type 2 diabetes mellitus with hyperglycemia: Secondary | ICD-10-CM

## 2018-01-15 DIAGNOSIS — E559 Vitamin D deficiency, unspecified: Secondary | ICD-10-CM | POA: Diagnosis not present

## 2018-01-15 NOTE — Progress Notes (Signed)
Office: [phone]  /  Fax: [phone]   HPI:   Chief Complaint: OBESITY Thomas [name] is here to discuss his progress with his obesity treatment plan. He is on the Category 3 plan and is following his eating plan approximately 80 % of the time. He states he is walking 30 to 40 minutes 3 to 4 times per week. Thomas [name] is doing well with the plan. He has had to travel and is eating out more. His weight is 230 lb (104.3 kg) today and has had a weight loss of 6 pounds over a period of 7 weeks since his last visit. He has lost 15 lbs since starting treatment with [name].  Diabetes II Thomas [name] has a diagnosis of diabetes type II. He is not on medications currently. Thomas [name] states fasting BGs range between 90 and 121 and he denies any hypoglycemic episodes. Last A1c was at 6.5 He has been working on intensive lifestyle modifications including diet, exercise, and weight loss to help control his blood glucose levels.  Vitamin D deficiency Thomas [name] has a diagnosis of vitamin D deficiency. He is currently taking high dose prescription vit D and denies nausea, vomiting or muscle weakness.  ASSESSMENT AND PLAN:  Type 2 diabetes mellitus with hyperglycemia, with long-term current use of insulin (HCC)  Vitamin D deficiency  Class 1 obesity with serious comorbidity and body mass index (BMI) of 32.0 to 32.9 in adult, unspecified obesity type  PLAN:  Diabetes II Barnet has been given extensive diabetes education by myself today including ideal fasting and post-prandial blood glucose readings, individual ideal Hgb A1c goals and hypoglycemia prevention. We discussed the importance of good blood sugar control to decrease the likelihood of diabetic complications such as nephropathy, neuropathy, limb loss, blindness, coronary artery disease, and death. We discussed the importance of intensive lifestyle modification including diet, exercise and weight loss as the first line treatment for diabetes. Neiman will  continue to decrease carbohydrates and increase lean protein in his diet.  Vitamin D Deficiency Gregrey was informed that low vitamin D levels contributes to fatigue and are associated with obesity, breast, and colon cancer. He agrees to continue to take prescription Vit D @50 ,000 IU every week and will follow up for routine testing of vitamin D, at least 2-3 times per year. He was informed of the risk of over-replacement of vitamin D and agrees to not increase his dose unless he discusses this with [name] first.  I spent > than 50% of the 15 minute visit on counseling as documented in the note.  Obesity Thomas [name] is currently in the action stage of change. As such, his goal is to continue with weight loss efforts He has agreed to follow the Category 3 plan Thomas [name] will continue to walk 30 to 40 minutes 3 to 4 times per weeks for weight loss and overall health benefits. We discussed the following Behavioral Modification Strategies today: increase H2O intake, no skipping meals, increasing lean protein intake, decreasing simple carbohydrates , increasing vegetables, decrease eating out and work on meal planning and easy cooking plans "Making smart fruit choices" handout was provided to patient today.  Thomas [name] has agreed to follow up with our clinic in 2 weeks fasting. He was informed of the importance of frequent follow up visits to maximize his success with intensive lifestyle modifications for his multiple health conditions.  ALLERGIES: Allergies  Allergen Reactions  . No Known Allergies     MEDICATIONS: Current Outpatient Medications on File Prior to Visit  Medication Sig Dispense  Refill  . albuterol (PROAIR HFA) 108 (90 Base) MCG/ACT inhaler INHALE 2 PUFFS EVERY 6 HOURS AS NEEDED FOR WHEEZING OR SHORTNESS OF BREATH. 3 Inhaler 3  . albuterol (PROVENTIL) (2.5 MG/3ML) 0.083% nebulizer solution Take 3 mLs (2.5 mg total) by nebulization every 4 (four) hours as needed for wheezing or shortness of  breath. 75 mL 5  . budesonide-formoterol (SYMBICORT) 160-4.5 MCG/ACT inhaler Inhale 2 puffs into the lungs 2 (two) times daily. 1 Inhaler 6  . fluticasone (FLONASE) 50 MCG/ACT nasal spray Place 2 sprays into both nostrils daily. 48 g 3  . hyoscyamine (LEVBID) 0.375 MG 12 hr tablet Take 0.375 mg by mouth daily as needed for cramping.   11  . lansoprazole (PREVACID) 15 MG capsule Take 15 mg by mouth daily.     . montelukast (SINGULAIR) 10 MG tablet Take 1 tablet (10 mg total) by mouth at bedtime. 30 tablet 2  . nystatin (MYCOSTATIN) 100000 UNIT/ML suspension Take 5 mLs (500,000 Units total) by mouth 4 (four) times daily. 60 mL 1  . tiZANidine (ZANAFLEX) 4 MG capsule Take 4 mg by mouth 3 (three) times daily as needed for muscle spasms.    . Vitamin D, Ergocalciferol, (DRISDOL) 50000 units CAPS capsule Take 1 capsule (50,000 Units total) by mouth every 7 (seven) days. 12 capsule 0   No current facility-administered medications on file prior to visit.     PAST MEDICAL HISTORY: Past Medical History:  Diagnosis Date  . Allergy   . Asthma   . Back pain   . Diabetes ([place])   . Dyspnea   . GERD (gastroesophageal reflux disease)   . History of hiatal hernia   . Pneumonia    5 -7 yrs ago  . Swallowing difficulty     PAST SURGICAL HISTORY: Past Surgical History:  Procedure Laterality Date  . ANTERIOR CERVICAL DECOMP/DISCECTOMY FUSION N/A 07/14/2016   Procedure: Cervical five-six5- Cervical six-seven Anterior cervical decompression/discectomy/fusion;  Surgeon: Erline Levine, MD;  Location: [facility];  Service: Neurosurgery;  Laterality: N/A;  . COLON SURGERY    . COLONOSCOPY  12/22/2014   per Dr. Earlean Shawl, clear, repeat in 5 yrs (hx of adenomas)   . DG THUMB LEFT HAND    . DG THUMB RIGHT HAND ([place] HX)    . FUNCTIONAL ENDOSCOPIC SINUS SURGERY    . PARTIAL COLECTOMY     sigmoid, Dr. Earlean Shawl    SOCIAL HISTORY: Social History   Tobacco Use  . Smoking status: Former Smoker    Packs/day: 1.00     Years: 10.00    Pack years: 10.00    Types: Cigarettes    Last attempt to quit: 01/31/1986    Years since quitting: 31.9  . Smokeless tobacco: Never Used  Substance Use Topics  . Alcohol use: Yes    Comment: rarely  . Drug use: No    FAMILY HISTORY: Family History  Problem Relation Age of Onset  . Cancer Mother   . Cancer Father   . Hyperlipidemia Father   . Diabetes Unknown        maternal family    ROS: Review of Systems  Constitutional: Positive for weight loss.  Gastrointestinal: Negative for nausea and vomiting.  Musculoskeletal:       Negative for muscle weakness  Endo/Heme/Allergies:       Negative for hypoglycemia    PHYSICAL EXAM: Blood pressure (!) 149/76, pulse 73, temperature 98 F (36.7 C), temperature source Oral, height 5\' 10"  (1.778 m), weight 230 lb (  104.3 kg), SpO2 98 %. Body mass index is 33 kg/m. Physical Exam Vitals signs reviewed.  Constitutional:      Appearance: Normal appearance. He is well-developed. He is obese.  Cardiovascular:     Rate and Rhythm: Normal rate.  Pulmonary:     Effort: Pulmonary effort is normal.  Musculoskeletal: Normal range of motion.  Skin:    General: Skin is warm and dry.  Neurological:     Mental Status: He is alert and oriented to person, place, and time.  Psychiatric:        Mood and Affect: Mood normal.        Behavior: Behavior normal.     RECENT LABS AND TESTS: BMET    Component Value Date/Time   NA 139 10/23/2017 0953   K 4.4 10/23/2017 0953   CL 99 10/23/2017 0953   CO2 24 10/23/2017 0953   GLUCOSE 114 (H) 10/23/2017 0953   GLUCOSE 126 (H) 07/31/2017 0947   BUN 12 10/23/2017 0953   CREATININE 1.00 10/23/2017 0953   CREATININE 1.05 04/24/2015 1428   CALCIUM 9.5 10/23/2017 0953   GFRNONAA 82 10/23/2017 0953   GFRNONAA 79 04/24/2015 1428   GFRAA 95 10/23/2017 0953   GFRAA >89 04/24/2015 1428   Lab Results  Component Value Date   HGBA1C 6.5 (H) 10/23/2017   HGBA1C 8.6 (H) 07/07/2017    Lab Results  Component Value Date   INSULIN 14.1 10/23/2017   CBC    Component Value Date/Time   WBC 8.2 10/23/2017 0953   WBC 6.6 07/31/2017 0947   RBC 4.85 10/23/2017 0953   RBC 4.78 07/31/2017 0947   HGB 16.0 10/23/2017 0953   HCT 42.6 10/23/2017 0953   PLT 345.0 07/31/2017 0947   MCV 88 10/23/2017 0953   MCH 33.0 10/23/2017 0953   MCH 30.6 07/11/2016 1211   MCHC 37.6 (H) 10/23/2017 0953   MCHC 34.9 07/31/2017 0947   RDW 13.4 10/23/2017 0953   LYMPHSABS 2.2 10/23/2017 0953   MONOABS 0.4 07/31/2017 0947   EOSABS 0.5 (H) 10/23/2017 0953   BASOSABS 0.0 10/23/2017 0953   Iron/TIBC/Ferritin/ %Sat No results found for: IRON, TIBC, FERRITIN, IRONPCTSAT Lipid Panel     Component Value Date/Time   CHOL 143 10/23/2017 0953   TRIG 64 10/23/2017 0953   HDL 36 (L) 10/23/2017 0953   CHOLHDL 5 07/31/2017 0947   VLDL 20.4 07/31/2017 0947   LDLCALC 94 10/23/2017 0953   Hepatic Function Panel     Component Value Date/Time   PROT 6.4 10/23/2017 0953   ALBUMIN 4.2 10/23/2017 0953   AST 18 10/23/2017 0953   ALT 29 10/23/2017 0953   ALKPHOS 110 10/23/2017 0953   BILITOT 0.6 10/23/2017 0953   BILIDIR 0.1 07/31/2017 0947      Component Value Date/Time   TSH 0.695 10/23/2017 0953   TSH 1.04 07/31/2017 0947   TSH 0.41 06/19/2017 1544    Ref. Range 10/23/2017 09:53  Vitamin D, 25-Hydroxy Latest Ref Range: 30.0 - 100.0 ng/mL 38.4     OBESITY BEHAVIORAL INTERVENTION VISIT  Today's visit was # 7   Starting weight: 245 lbs Starting date: 10/23/2017 Today's weight : 230 lbs  Today's date: 01/15/2018 Total lbs lost to date: 15   ASK: We discussed the diagnosis of obesity with Shirlean Schlein today and Legrand Como agreed to give [name] permission to discuss obesity behavioral modification therapy today.  ASSESS: Zyler has the diagnosis of obesity and his BMI today is 72 Cordel is  in the action stage of change   ADVISE: Jep was educated on the multiple health risks of  obesity as well as the benefit of weight loss to improve his health. He was advised of the need for long term treatment and the importance of lifestyle modifications to improve his current health and to decrease his risk of future health problems.  AGREE: Multiple dietary modification options and treatment options were discussed and  Grantley agreed to follow the recommendations documented in the above note.  ARRANGE: Deivi was educated on the importance of frequent visits to treat obesity as outlined per CMS and USPSTF guidelines and agreed to schedule his next follow up appointment today.  Corey Skains, am acting as Location manager for [employer]. Owens Shark, DO  I have reviewed the above documentation for accuracy and completeness, and I agree with the above. -Jearld Lesch, DO

## 2018-02-06 ENCOUNTER — Telehealth (INDEPENDENT_AMBULATORY_CARE_PROVIDER_SITE_OTHER): Payer: Self-pay | Admitting: Bariatrics

## 2018-02-06 NOTE — Telephone Encounter (Signed)
Pt needs refill on 90-day supply of calcium called in to [facility], [address]. [address]

## 2018-02-06 NOTE — Telephone Encounter (Signed)
Per Dr Owens Shark, left the patient a message that she would like to wait until his office visit on Monday the 13th before prescribing na 90 day supply due to the patient last Vit D levels are just about at goal and he may not require that dose. Left a detailed message to that affect on the pt voice mail. Thomas [name], [place]

## 2018-02-12 ENCOUNTER — Encounter (INDEPENDENT_AMBULATORY_CARE_PROVIDER_SITE_OTHER): Payer: Self-pay | Admitting: Bariatrics

## 2018-02-12 ENCOUNTER — Ambulatory Visit (INDEPENDENT_AMBULATORY_CARE_PROVIDER_SITE_OTHER): Payer: BLUE CROSS/BLUE SHIELD | Admitting: Bariatrics

## 2018-02-12 VITALS — BP 112/65 | HR 78 | Temp 97.7°F | Ht 70.0 in | Wt 221.0 lb

## 2018-02-12 DIAGNOSIS — Z6831 Body mass index (BMI) 31.0-31.9, adult: Secondary | ICD-10-CM

## 2018-02-12 DIAGNOSIS — E559 Vitamin D deficiency, unspecified: Secondary | ICD-10-CM

## 2018-02-12 DIAGNOSIS — E669 Obesity, unspecified: Secondary | ICD-10-CM | POA: Diagnosis not present

## 2018-02-12 DIAGNOSIS — E119 Type 2 diabetes mellitus without complications: Secondary | ICD-10-CM | POA: Diagnosis not present

## 2018-02-12 DIAGNOSIS — Z9189 Other specified personal risk factors, not elsewhere classified: Secondary | ICD-10-CM | POA: Diagnosis not present

## 2018-02-12 MED ORDER — VITAMIN D (ERGOCALCIFEROL) 1.25 MG (50000 UNIT) PO CAPS: 50000.0000 [IU] | ORAL_CAPSULE | ORAL | 0 refills | Status: DC

## 2018-02-13 LAB — COMPREHENSIVE METABOLIC PANEL
ALT: 31 IU/L (ref 0–44)
AST: 24 IU/L (ref 0–40)
Albumin/Globulin Ratio: 1.7 (ref 1.2–2.2)
Albumin: 4.3 g/dL (ref 3.5–5.5)
Alkaline Phosphatase: 93 IU/L (ref 39–117)
BUN/Creatinine Ratio: 15 (ref 9–20)
BUN: 15 mg/dL (ref 6–24)
Bilirubin Total: 0.6 mg/dL (ref 0.0–1.2)
CO2: 23 mmol/L (ref 20–29)
Calcium: 9.7 mg/dL (ref 8.7–10.2)
Chloride: 104 mmol/L (ref 96–106)
Creatinine, Ser: 1 mg/dL (ref 0.76–1.27)
GFR calc Af Amer: 95 mL/min/{1.73_m2} (ref >59)
GFR calc non Af Amer: 82 mL/min/{1.73_m2} (ref >59)
Globulin, Total: 2.6 g/dL (ref 1.5–4.5)
Glucose: 110 mg/dL — ABNORMAL HIGH (ref 65–99)
POTASSIUM: 4.6 mmol/L (ref 3.5–5.2)
Sodium: 142 mmol/L (ref 134–144)
Total Protein: 6.9 g/dL (ref 6.0–8.5)

## 2018-02-13 LAB — INSULIN, RANDOM: INSULIN: 10.9 u[IU]/mL (ref 2.6–24.9)

## 2018-02-13 LAB — HEMOGLOBIN A1C
ESTIMATED AVERAGE GLUCOSE: 117 mg/dL
Hgb A1c MFr Bld: 5.7 % — ABNORMAL HIGH (ref 4.8–5.6)

## 2018-02-13 LAB — VITAMIN D 25 HYDROXY (VIT D DEFICIENCY, FRACTURES): Vit D, 25-Hydroxy: 56.1 ng/mL (ref 30.0–100.0)

## 2018-02-13 NOTE — Progress Notes (Signed)
Office: [phone]  /  Fax: [phone]   HPI:   Chief Complaint: OBESITY Thomas [name] is here to discuss his progress with his obesity treatment plan. He is on the Category 3 plan and is following his eating plan approximately 85 % of the time. He states he is walking 40 minutes 3 to 4 times per week. Thomas [name] is doing very well overall. He denies struggling and he states that he is getting into better habits. His weight is 221 lb (100.2 kg) today and has had a weight loss of 9 pounds over a period of 4 weeks since his last visit. He has lost 24 lbs since starting treatment with [name].  Vitamin D deficiency Thomas [name] has a diagnosis of vitamin D deficiency. He is currently taking vit D and denies nausea, vomiting or muscle weakness.  At risk for osteopenia and osteoporosis Thomas [name] is at higher risk of osteopenia and osteoporosis due to vitamin D deficiency.   Diabetes II Thomas [name] has a diagnosis of diabetes type II. He is not on medications. Thomas [name] states fasting BGs range between 90 and 120 and he denies any hypoglycemic episodes or polyphagia. Last A1c was at 6.5 He has been working on intensive lifestyle modifications including diet, exercise, and weight loss to help control his blood glucose levels.  ASSESSMENT AND PLAN:  Vitamin D deficiency - Plan: VITAMIN D 25 Hydroxy (Vit-D Deficiency, Fractures), Vitamin D, Ergocalciferol, (DRISDOL) 1.25 MG (50000 UT) CAPS capsule  Type 2 diabetes mellitus without complication, without long-term current use of insulin (HCC) - Plan: Comprehensive metabolic panel, Hemoglobin A1c, Insulin, random  At risk for osteoporosis  Class 1 obesity with serious comorbidity and body mass index (BMI) of 31.0 to 31.9 in adult, unspecified obesity type  PLAN:  Vitamin D Deficiency Thomas [name] was informed that low vitamin D levels contributes to fatigue and are associated with obesity, breast, and colon cancer. He agrees to continue to take prescription Vit D  @50 ,000 IU every week #4 with no refills and will follow up for routine testing of vitamin D, at least 2-3 times per year. He was informed of the risk of over-replacement of vitamin D and agrees to not increase his dose unless he discusses this with [name] first. We will check vitamin D level today and Thomas [name] agrees to follow up as directed.  At risk for osteopenia and osteoporosis Thomas [name] was given extended  (15 minutes) osteoporosis prevention counseling today. Thomas [name] is at risk for osteopenia and osteoporosis due to his vitamin D deficiency. He was encouraged to take his vitamin D and follow his higher calcium diet and increase strengthening exercise to help strengthen his bones and decrease his risk of osteopenia and osteoporosis.  Diabetes II Thomas [name] has been given extensive diabetes education by myself today including ideal fasting and post-prandial blood glucose readings, individual ideal Hgb A1c goals and hypoglycemia prevention. We discussed the importance of good blood sugar control to decrease the likelihood of diabetic complications such as nephropathy, neuropathy, limb loss, blindness, coronary artery disease, and death. We discussed the importance of intensive lifestyle modification including diet, exercise and weight loss as the first line treatment for diabetes. We will check Hgb A1c and insulin level today and Thomas [name] agrees to follow up as directed.  Obesity Thomas [name] is currently in the action stage of change. As such, his goal is to continue with weight loss efforts He has agreed to follow the Category 3 plan Thomas [name] has been instructed to work up to a goal of 150 minutes  of combined cardio and strengthening exercise per week for weight loss and overall health benefits. We discussed the following Behavioral Modification Strategies today: increase H2O intake, keeping healthy foods in the home, increasing lean protein intake, decreasing simple carbohydrates, increasing vegetables,  decrease eating out and work on meal planning and intentional eating  Thomas [name] has agreed to follow up with our clinic in 2 weeks. He was informed of the importance of frequent follow up visits to maximize his success with intensive lifestyle modifications for his multiple health conditions.  ALLERGIES: Allergies  Allergen Reactions  . No Known Allergies     MEDICATIONS: Current Outpatient Medications on File Prior to Visit  Medication Sig Dispense Refill  . albuterol (PROAIR HFA) 108 (90 Base) MCG/ACT inhaler INHALE 2 PUFFS EVERY 6 HOURS AS NEEDED FOR WHEEZING OR SHORTNESS OF BREATH. 3 Inhaler 3  . albuterol (PROVENTIL) (2.5 MG/3ML) 0.083% nebulizer solution Take 3 mLs (2.5 mg total) by nebulization every 4 (four) hours as needed for wheezing or shortness of breath. 75 mL 5  . budesonide-formoterol (SYMBICORT) 160-4.5 MCG/ACT inhaler Inhale 2 puffs into the lungs 2 (two) times daily. 1 Inhaler 6  . fluticasone (FLONASE) 50 MCG/ACT nasal spray Place 2 sprays into both nostrils daily. 48 g 3  . hyoscyamine (LEVBID) 0.375 MG 12 hr tablet Take 0.375 mg by mouth daily as needed for cramping.   11  . lansoprazole (PREVACID) 15 MG capsule Take 15 mg by mouth daily.     . montelukast (SINGULAIR) 10 MG tablet Take 1 tablet (10 mg total) by mouth at bedtime. 30 tablet 2  . nystatin (MYCOSTATIN) 100000 UNIT/ML suspension Take 5 mLs (500,000 Units total) by mouth 4 (four) times daily. 60 mL 1  . tiZANidine (ZANAFLEX) 4 MG capsule Take 4 mg by mouth 3 (three) times daily as needed for muscle spasms.     No current facility-administered medications on file prior to visit.     PAST MEDICAL HISTORY: Past Medical History:  Diagnosis Date  . Allergy   . Asthma   . Back pain   . Diabetes ([place])   . Dyspnea   . GERD (gastroesophageal reflux disease)   . History of hiatal hernia   . Pneumonia    5 -7 yrs ago  . Swallowing difficulty     PAST SURGICAL HISTORY: Past Surgical History:  Procedure  Laterality Date  . ANTERIOR CERVICAL DECOMP/DISCECTOMY FUSION N/A 07/14/2016   Procedure: Cervical five-six5- Cervical six-seven Anterior cervical decompression/discectomy/fusion;  Surgeon: Erline Levine, MD;  Location: [place];  Service: Neurosurgery;  Laterality: N/A;  . COLON SURGERY    . COLONOSCOPY  12/22/2014   per Dr. Earlean Shawl, clear, repeat in 5 yrs (hx of adenomas)   . DG THUMB LEFT HAND    . DG THUMB RIGHT HAND ([place] HX)    . FUNCTIONAL ENDOSCOPIC SINUS SURGERY    . PARTIAL COLECTOMY     sigmoid, Dr. Earlean Shawl    SOCIAL HISTORY: Social History   Tobacco Use  . Smoking status: Former Smoker    Packs/day: 1.00    Years: 10.00    Pack years: 10.00    Types: Cigarettes    Last attempt to quit: 01/31/1986    Years since quitting: 32.0  . Smokeless tobacco: Never Used  Substance Use Topics  . Alcohol use: Yes    Comment: rarely  . Drug use: No    FAMILY HISTORY: Family History  Problem Relation Age of Onset  . Cancer Mother   .  Cancer Father   . Hyperlipidemia Father   . Diabetes Unknown        maternal family    ROS: Review of Systems  Constitutional: Positive for weight loss.  Gastrointestinal: Negative for diarrhea, nausea and vomiting.  Musculoskeletal:       Negative for muscle weakness  Endo/Heme/Allergies:       Negative for hypoglycemia Negative for polyphagia    PHYSICAL EXAM: Blood pressure 112/65, pulse 78, temperature 97.7 F (36.5 C), temperature source Oral, height 5\' 10"  (1.778 m), weight 221 lb (100.2 kg), SpO2 97 %. Body mass index is 31.71 kg/m. Physical Exam Vitals signs reviewed.  Constitutional:      Appearance: Normal appearance. He is well-developed. He is obese.  Cardiovascular:     Rate and Rhythm: Normal rate.  Pulmonary:     Effort: Pulmonary effort is normal.  Musculoskeletal: Normal range of motion.  Skin:    General: Skin is warm and dry.  Neurological:     Mental Status: He is alert and oriented to person, place, and time.    Psychiatric:        Mood and Affect: Mood normal.        Behavior: Behavior normal.     RECENT LABS AND TESTS: BMET    Component Value Date/Time   NA 142 02/12/2018 1328   K 4.6 02/12/2018 1328   CL 104 02/12/2018 1328   CO2 23 02/12/2018 1328   GLUCOSE 110 (H) 02/12/2018 1328   GLUCOSE 126 (H) 07/31/2017 0947   BUN 15 02/12/2018 1328   CREATININE 1.00 02/12/2018 1328   CREATININE 1.05 04/24/2015 1428   CALCIUM 9.7 02/12/2018 1328   GFRNONAA 82 02/12/2018 1328   GFRNONAA 79 04/24/2015 1428   GFRAA 95 02/12/2018 1328   GFRAA >89 04/24/2015 1428   Lab Results  Component Value Date   HGBA1C 5.7 (H) 02/12/2018   HGBA1C 6.5 (H) 10/23/2017   HGBA1C 8.6 (H) 07/07/2017   Lab Results  Component Value Date   INSULIN 10.9 02/12/2018   INSULIN 14.1 10/23/2017   CBC    Component Value Date/Time   WBC 8.2 10/23/2017 0953   WBC 6.6 07/31/2017 0947   RBC 4.85 10/23/2017 0953   RBC 4.78 07/31/2017 0947   HGB 16.0 10/23/2017 0953   HCT 42.6 10/23/2017 0953   PLT 345.0 07/31/2017 0947   MCV 88 10/23/2017 0953   MCH 33.0 10/23/2017 0953   MCH 30.6 07/11/2016 1211   MCHC 37.6 (H) 10/23/2017 0953   MCHC 34.9 07/31/2017 0947   RDW 13.4 10/23/2017 0953   LYMPHSABS 2.2 10/23/2017 0953   MONOABS 0.4 07/31/2017 0947   EOSABS 0.5 (H) 10/23/2017 0953   BASOSABS 0.0 10/23/2017 0953   Iron/TIBC/Ferritin/ %Sat No results found for: IRON, TIBC, FERRITIN, IRONPCTSAT Lipid Panel     Component Value Date/Time   CHOL 143 10/23/2017 0953   TRIG 64 10/23/2017 0953   HDL 36 (L) 10/23/2017 0953   CHOLHDL 5 07/31/2017 0947   VLDL 20.4 07/31/2017 0947   LDLCALC 94 10/23/2017 0953   Hepatic Function Panel     Component Value Date/Time   PROT 6.9 02/12/2018 1328   ALBUMIN 4.3 02/12/2018 1328   AST 24 02/12/2018 1328   ALT 31 02/12/2018 1328   ALKPHOS 93 02/12/2018 1328   BILITOT 0.6 02/12/2018 1328   BILIDIR 0.1 07/31/2017 0947      Component Value Date/Time   TSH 0.695  10/23/2017 0953   TSH 1.04 07/31/2017 0947  TSH 0.41 06/19/2017 1544     Ref. Range 10/23/2017 09:53  Vitamin D, 25-Hydroxy Latest Ref Range: 30.0 - 100.0 ng/mL 38.4     OBESITY BEHAVIORAL INTERVENTION VISIT  Today's visit was # 8   Starting weight: 245 lbs Starting date: 10/23/2017 Today's weight : 221 lbs  Today's date: 02/12/2018 Total lbs lost to date: 24   ASK: We discussed the diagnosis of obesity with Thomas [name] today and Thomas [name] agreed to give [name] permission to discuss obesity behavioral modification therapy today.  ASSESS: Thomas [name] has the diagnosis of obesity and his BMI today is 31.71 Thomas [name] is in the action stage of change   ADVISE: Thomas [name] was educated on the multiple health risks of obesity as well as the benefit of weight loss to improve his health. He was advised of the need for long term treatment and the importance of lifestyle modifications to improve his current health and to decrease his risk of future health problems.  AGREE: Multiple dietary modification options and treatment options were discussed and  Thomas [name] agreed to follow the recommendations documented in the above note.  ARRANGE: Thomas [name] was educated on the importance of frequent visits to treat obesity as outlined per CMS and USPSTF guidelines and agreed to schedule his next follow up appointment today.  Corey Skains, am acting as Location manager for [employer]. Owens Shark, DO  I have reviewed the above documentation for accuracy and completeness, and I agree with the above. -Jearld Lesch, DO

## 2018-02-18 ENCOUNTER — Other Ambulatory Visit: Payer: Self-pay | Admitting: Pulmonary Disease

## 2018-02-26 ENCOUNTER — Ambulatory Visit (INDEPENDENT_AMBULATORY_CARE_PROVIDER_SITE_OTHER): Payer: BLUE CROSS/BLUE SHIELD | Admitting: Bariatrics

## 2018-02-26 VITALS — BP 125/67 | HR 69 | Temp 98.3°F | Ht 70.0 in | Wt 223.0 lb

## 2018-02-26 DIAGNOSIS — E669 Obesity, unspecified: Secondary | ICD-10-CM

## 2018-02-26 DIAGNOSIS — E559 Vitamin D deficiency, unspecified: Secondary | ICD-10-CM | POA: Diagnosis not present

## 2018-02-26 DIAGNOSIS — E119 Type 2 diabetes mellitus without complications: Secondary | ICD-10-CM | POA: Diagnosis not present

## 2018-02-26 DIAGNOSIS — Z9189 Other specified personal risk factors, not elsewhere classified: Secondary | ICD-10-CM

## 2018-02-26 DIAGNOSIS — Z6832 Body mass index (BMI) 32.0-32.9, adult: Secondary | ICD-10-CM

## 2018-02-26 MED ORDER — VITAMIN D (ERGOCALCIFEROL) 1.25 MG (50000 UNIT) PO CAPS: 50000.0000 [IU] | ORAL_CAPSULE | ORAL | 0 refills | Status: DC

## 2018-02-27 NOTE — Progress Notes (Signed)
Office: [phone]  /  Fax: [phone]   HPI:   Chief Complaint: OBESITY Thomas [name] is here to discuss his progress with his obesity treatment plan. He is on the Category 3 plan and is following his eating plan approximately 85 % of the time. He states he is walking 40 minutes 3 to 4 times per week. Thomas [name] is doing well overall. She had an increase of approximately 3 pounds in water weight. His weight is 223 lb (101.2 kg) today and has had a weight gain of 2 pounds over a period of 2 weeks since his last visit. He has lost 22 lbs since starting treatment with [name].  Vitamin D deficiency Thomas [name] has a diagnosis of vitamin D deficiency. He is currently taking vit D and denies nausea, vomiting or muscle weakness.  At risk for osteopenia and osteoporosis Thomas [name] is at higher risk of osteopenia and osteoporosis due to vitamin D deficiency.   Diabetes II Thomas [name] has a diagnosis of diabetes type II. He is not on medications. Thomas [name] denies polyphagia. Last A1c was at 5.7, down from 8.6 on 07/07/17 He has been working on intensive lifestyle modifications including diet, exercise, and weight loss to help control his blood glucose levels.  ASSESSMENT AND PLAN:  Vitamin D deficiency - Plan: Vitamin D, Ergocalciferol, (DRISDOL) 1.25 MG (50000 UT) CAPS capsule  Type 2 diabetes mellitus without complication, without long-term current use of insulin (HCC)  At risk for osteoporosis  Class 1 obesity with serious comorbidity and body mass index (BMI) of 32.0 to 32.9 in adult, unspecified obesity type  PLAN:  Vitamin D Deficiency Thomas [name] was informed that low vitamin D levels contributes to fatigue and are associated with obesity, breast, and colon cancer. He agrees to continue to take prescription Vit D @50 ,000 IU every week #4 with no refills and will follow up for routine testing of vitamin D, at least 2-3 times per year. He was informed of the risk of over-replacement of vitamin D and agrees to  not increase his dose unless he discusses this with [name] first. Thomas [name] agrees to follow up as directed.  At risk for osteopenia and osteoporosis Thomas [name] was given extended  (15 minutes) osteoporosis prevention counseling today. Thomas [name] is at risk for osteopenia and osteoporosis due to his vitamin D deficiency. He was encouraged to take his vitamin D and follow his higher calcium diet and increase strengthening exercise to help strengthen his bones and decrease his risk of osteopenia and osteoporosis.  Diabetes II Thomas [name] has been given extensive diabetes education by myself today including ideal fasting and post-prandial blood glucose readings, individual ideal Hgb A1c goals and hypoglycemia prevention. We discussed the importance of good blood sugar control to decrease the likelihood of diabetic complications such as nephropathy, neuropathy, limb loss, blindness, coronary artery disease, and death. We discussed the importance of intensive lifestyle modification including diet, exercise and weight loss as the first line treatment for diabetes. Thomas [name] will work on decreasing simple carbohydrates and will follow up at the agreed upon time.  Obesity Thomas [name] is currently in the action stage of change. As such, his goal is to continue with weight loss efforts He has agreed to follow the Category 3 plan Thomas [name] has been instructed to work up to a goal of 150 minutes of combined cardio and strengthening exercise per week for weight loss and overall health benefits. We discussed the following Behavioral Modification Strategies today: increase H2O intake, keeping healthy foods in the home, increasing lean protein intake, decreasing  simple carbohydrates, increasing vegetables and work on meal planning and easy cooking plans  Thomas [name] has agreed to follow up with our clinic in 2 weeks. He was informed of the importance of frequent follow up visits to maximize his success with intensive lifestyle modifications  for his multiple health conditions.  ALLERGIES: Allergies  Allergen Reactions  . No Known Allergies     MEDICATIONS: Current Outpatient Medications on File Prior to Visit  Medication Sig Dispense Refill  . albuterol (PROAIR HFA) 108 (90 Base) MCG/ACT inhaler INHALE 2 PUFFS EVERY 6 HOURS AS NEEDED FOR WHEEZING OR SHORTNESS OF BREATH. 3 Inhaler 3  . albuterol (PROVENTIL) (2.5 MG/3ML) 0.083% nebulizer solution Take 3 mLs (2.5 mg total) by nebulization every 4 (four) hours as needed for wheezing or shortness of breath. 75 mL 5  . budesonide-formoterol (SYMBICORT) 160-4.5 MCG/ACT inhaler Inhale 2 puffs into the lungs 2 (two) times daily. 1 Inhaler 6  . fluticasone (FLONASE) 50 MCG/ACT nasal spray Place 2 sprays into both nostrils daily. 48 g 3  . hyoscyamine (LEVBID) 0.375 MG 12 hr tablet Take 0.375 mg by mouth daily as needed for cramping.   11  . lansoprazole (PREVACID) 15 MG capsule Take 15 mg by mouth daily.     . montelukast (SINGULAIR) 10 MG tablet TAKE 1 TABLET BY MOUTH EVERYDAY AT BEDTIME 90 tablet 0  . nystatin (MYCOSTATIN) 100000 UNIT/ML suspension Take 5 mLs (500,000 Units total) by mouth 4 (four) times daily. 60 mL 1  . tiZANidine (ZANAFLEX) 4 MG capsule Take 4 mg by mouth 3 (three) times daily as needed for muscle spasms.     No current facility-administered medications on file prior to visit.     PAST MEDICAL HISTORY: Past Medical History:  Diagnosis Date  . Allergy   . Asthma   . Back pain   . Diabetes ([place])   . Dyspnea   . GERD (gastroesophageal reflux disease)   . History of hiatal hernia   . Pneumonia    5 -7 yrs ago  . Swallowing difficulty     PAST SURGICAL HISTORY: Past Surgical History:  Procedure Laterality Date  . ANTERIOR CERVICAL DECOMP/DISCECTOMY FUSION N/A 07/14/2016   Procedure: Cervical five-six5- Cervical six-seven Anterior cervical decompression/discectomy/fusion;  Surgeon: Erline Levine, MD;  Location: [place];  Service: Neurosurgery;  Laterality:  N/A;  . COLON SURGERY    . COLONOSCOPY  12/22/2014   per Dr. Earlean Shawl, clear, repeat in 5 yrs (hx of adenomas)   . DG THUMB LEFT HAND    . DG THUMB RIGHT HAND ([place] HX)    . FUNCTIONAL ENDOSCOPIC SINUS SURGERY    . PARTIAL COLECTOMY     sigmoid, Dr. Earlean Shawl    SOCIAL HISTORY: Social History   Tobacco Use  . Smoking status: Former Smoker    Packs/day: 1.00    Years: 10.00    Pack years: 10.00    Types: Cigarettes    Last attempt to quit: 01/31/1986    Years since quitting: 32.0  . Smokeless tobacco: Never Used  Substance Use Topics  . Alcohol use: Yes    Comment: rarely  . Drug use: No    FAMILY HISTORY: Family History  Problem Relation Age of Onset  . Cancer Mother   . Cancer Father   . Hyperlipidemia Father   . Diabetes Unknown        maternal family    ROS: Review of Systems  Constitutional: Negative for weight loss.  Gastrointestinal: Negative for nausea and  vomiting.  Musculoskeletal:       Negative for muscle weakness  Endo/Heme/Allergies:       Negative for polyphagia    PHYSICAL EXAM: Blood pressure 125/67, pulse 69, temperature 98.3 F (36.8 C), temperature source Oral, height 5\' 10"  (1.778 m), weight 223 lb (101.2 kg), SpO2 99 %. Body mass index is 32 kg/m. Physical Exam Vitals signs reviewed.  Constitutional:      Appearance: Normal appearance. He is well-developed. He is obese.  Cardiovascular:     Rate and Rhythm: Normal rate.  Pulmonary:     Effort: Pulmonary effort is normal.  Musculoskeletal: Normal range of motion.  Skin:    General: Skin is warm and dry.  Neurological:     Mental Status: He is alert and oriented to person, place, and time.  Psychiatric:        Mood and Affect: Mood normal.        Behavior: Behavior normal.     RECENT LABS AND TESTS: BMET    Component Value Date/Time   NA 142 02/12/2018 1328   K 4.6 02/12/2018 1328   CL 104 02/12/2018 1328   CO2 23 02/12/2018 1328   GLUCOSE 110 (H) 02/12/2018 1328   GLUCOSE  126 (H) 07/31/2017 0947   BUN 15 02/12/2018 1328   CREATININE 1.00 02/12/2018 1328   CREATININE 1.05 04/24/2015 1428   CALCIUM 9.7 02/12/2018 1328   GFRNONAA 82 02/12/2018 1328   GFRNONAA 79 04/24/2015 1428   GFRAA 95 02/12/2018 1328   GFRAA >89 04/24/2015 1428   Lab Results  Component Value Date   HGBA1C 5.7 (H) 02/12/2018   HGBA1C 6.5 (H) 10/23/2017   HGBA1C 8.6 (H) 07/07/2017   Lab Results  Component Value Date   INSULIN 10.9 02/12/2018   INSULIN 14.1 10/23/2017   CBC    Component Value Date/Time   WBC 8.2 10/23/2017 0953   WBC 6.6 07/31/2017 0947   RBC 4.85 10/23/2017 0953   RBC 4.78 07/31/2017 0947   HGB 16.0 10/23/2017 0953   HCT 42.6 10/23/2017 0953   PLT 345.0 07/31/2017 0947   MCV 88 10/23/2017 0953   MCH 33.0 10/23/2017 0953   MCH 30.6 07/11/2016 1211   MCHC 37.6 (H) 10/23/2017 0953   MCHC 34.9 07/31/2017 0947   RDW 13.4 10/23/2017 0953   LYMPHSABS 2.2 10/23/2017 0953   MONOABS 0.4 07/31/2017 0947   EOSABS 0.5 (H) 10/23/2017 0953   BASOSABS 0.0 10/23/2017 0953   Iron/TIBC/Ferritin/ %Sat No results found for: IRON, TIBC, FERRITIN, IRONPCTSAT Lipid Panel     Component Value Date/Time   CHOL 143 10/23/2017 0953   TRIG 64 10/23/2017 0953   HDL 36 (L) 10/23/2017 0953   CHOLHDL 5 07/31/2017 0947   VLDL 20.4 07/31/2017 0947   LDLCALC 94 10/23/2017 0953   Hepatic Function Panel     Component Value Date/Time   PROT 6.9 02/12/2018 1328   ALBUMIN 4.3 02/12/2018 1328   AST 24 02/12/2018 1328   ALT 31 02/12/2018 1328   ALKPHOS 93 02/12/2018 1328   BILITOT 0.6 02/12/2018 1328   BILIDIR 0.1 07/31/2017 0947      Component Value Date/Time   TSH 0.695 10/23/2017 0953   TSH 1.04 07/31/2017 0947   TSH 0.41 06/19/2017 1544     Ref. Range 02/12/2018 13:28  Vitamin D, 25-Hydroxy Latest Ref Range: 30.0 - 100.0 ng/mL 56.1     OBESITY BEHAVIORAL INTERVENTION VISIT  Today's visit was # 9   Starting weight: 245 lbs  Starting date: 10/23/2017 Today's  weight : 223 lbs Today's date: 02/26/2018 Total lbs lost to date: 22   ASK: We discussed the diagnosis of obesity with Thomas [name] today and Thomas [name] agreed to give [name] permission to discuss obesity behavioral modification therapy today.  ASSESS: Thomas [name] has the diagnosis of obesity and his BMI today is 86 Thomas [name] is in the action stage of change   ADVISE: Thomas [name] was educated on the multiple health risks of obesity as well as the benefit of weight loss to improve his health. He was advised of the need for long term treatment and the importance of lifestyle modifications to improve his current health and to decrease his risk of future health problems.  AGREE: Multiple dietary modification options and treatment options were discussed and  Thomas [name] agreed to follow the recommendations documented in the above note.  ARRANGE: Thomas [name] was educated on the importance of frequent visits to treat obesity as outlined per CMS and USPSTF guidelines and agreed to schedule his next follow up appointment today.  Corey Skains, am acting as Location manager for [employer]. Owens Shark, DO  I have reviewed the above documentation for accuracy and completeness, and I agree with the above. -Jearld Lesch, DO

## 2018-03-12 ENCOUNTER — Ambulatory Visit (INDEPENDENT_AMBULATORY_CARE_PROVIDER_SITE_OTHER): Payer: BLUE CROSS/BLUE SHIELD | Admitting: Family Medicine

## 2018-03-12 ENCOUNTER — Encounter (INDEPENDENT_AMBULATORY_CARE_PROVIDER_SITE_OTHER): Payer: Self-pay | Admitting: Family Medicine

## 2018-03-12 VITALS — BP 149/69 | HR 70 | Temp 98.1°F | Ht 70.0 in | Wt 224.0 lb

## 2018-03-12 DIAGNOSIS — Z9189 Other specified personal risk factors, not elsewhere classified: Secondary | ICD-10-CM

## 2018-03-12 DIAGNOSIS — E1165 Type 2 diabetes mellitus with hyperglycemia: Secondary | ICD-10-CM | POA: Diagnosis not present

## 2018-03-12 DIAGNOSIS — E669 Obesity, unspecified: Secondary | ICD-10-CM | POA: Diagnosis not present

## 2018-03-12 DIAGNOSIS — E559 Vitamin D deficiency, unspecified: Secondary | ICD-10-CM

## 2018-03-12 DIAGNOSIS — Z6832 Body mass index (BMI) 32.0-32.9, adult: Secondary | ICD-10-CM

## 2018-03-12 MED ORDER — VITAMIN D (ERGOCALCIFEROL) 1.25 MG (50000 UNIT) PO CAPS: 50000.0000 [IU] | ORAL_CAPSULE | ORAL | 0 refills | Status: DC

## 2018-03-13 NOTE — Progress Notes (Signed)
Office: [phone]  /  Fax: [phone]   HPI:   Chief Complaint: OBESITY Thomas [name] is here to discuss his progress with his obesity treatment plan. He is on the Category 3 plan and is following his eating plan approximately 85-90 % of the time. He states he is walking for 35 minutes 4 times per week. Thomas [name] is occasionally substituting an egg at breakfast for a packet of oatmeal. He is drinking 2% milk at breakfast. He has had 3 deaths in the past month. He reports that it made it more difficult to follow the plan.  His weight is 224 lb (101.6 kg) today and has gained 1 pound since his last visit. He has lost 21 lbs since starting treatment with [name].  Vitamin D Deficiency Thomas [name] has a diagnosis of vitamin D deficiency. He is currently taking prescription Vit D. He notes fatigue and denies nausea, vomiting or muscle weakness.  At risk for osteopenia and osteoporosis Thomas [name] is at higher risk of osteopenia and osteoporosis due to vitamin D deficiency.   Diabetes II with Hyperglycemia Thomas [name] has a diagnosis of diabetes type II. Thomas [name] states he is not checking BGs and he denies feeling hypoglycemia. Last A1c was 5.7. He has been working on intensive lifestyle modifications including diet, exercise, and weight loss to help control his blood glucose levels.  ASSESSMENT AND PLAN:  Vitamin D deficiency - Plan: Vitamin D, Ergocalciferol, (DRISDOL) 1.25 MG (50000 UT) CAPS capsule  Type 2 diabetes mellitus with hyperglycemia, without long-term current use of insulin (HCC)  At risk for osteoporosis  Class 1 obesity with serious comorbidity and body mass index (BMI) of 32.0 to 32.9 in adult, unspecified obesity type  PLAN:  Vitamin D Deficiency Thomas [name] was informed that low vitamin D levels contributes to fatigue and are associated with obesity, breast, and colon cancer. Thomas [name] agrees to continue taking prescription Vit D @50 ,000 IU every week #4 and we will refill for 1 month. He will  follow up for routine testing of vitamin D, at least 2-3 times per year. He was informed of the risk of over-replacement of vitamin D and agrees to not increase his dose unless he discusses this with [name] first. Thomas [name] agrees to follow up with our clinic in 2 weeks.  At risk for osteopenia and osteoporosis Thomas [name] was given extended (15 minutes) osteoporosis prevention counseling today. Thomas [name] is at risk for osteopenia and osteoporsis due to his vitamin D deficiency. He was encouraged to take his vitamin D and follow his higher calcium diet and increase strengthening exercise to help strengthen his bones and decrease his risk of osteopenia and osteoporosis.  Diabetes II with Hyperglycemia Thomas [name] has been given extensive diabetes education by myself today including ideal fasting and post-prandial blood glucose readings, individual ideal Hgb A1c goals and hypoglycemia prevention. We discussed the importance of good blood sugar control to decrease the likelihood of diabetic complications such as nephropathy, neuropathy, limb loss, blindness, coronary artery disease, and death. We discussed the importance of intensive lifestyle modification including diet, exercise and weight loss as the first line treatment for diabetes. Thomas [name] will continue his Category 4 plan, and he agrees to follow up with our clinic in 2 weeks.  Obesity Thomas [name] is currently in the action stage of change. As such, his goal is to continue with weight loss efforts He has agreed to follow the Category 4 plan Thomas [name] has been instructed to work up to a goal of 150 minutes of combined cardio and strengthening exercise per  week for weight loss and overall health benefits. We discussed the following Behavioral Modification Strategies today: increasing lean protein intake, increasing vegetables, work on meal planning and easy cooking plans, better snacking choices, and planning for success   Thomas [name] has agreed to follow up with our  clinic in 2 weeks. He was informed of the importance of frequent follow up visits to maximize his success with intensive lifestyle modifications for his multiple health conditions.  ALLERGIES: Allergies  Allergen Reactions  . No Known Allergies     MEDICATIONS: Current Outpatient Medications on File Prior to Visit  Medication Sig Dispense Refill  . albuterol (PROAIR HFA) 108 (90 Base) MCG/ACT inhaler INHALE 2 PUFFS EVERY 6 HOURS AS NEEDED FOR WHEEZING OR SHORTNESS OF BREATH. 3 Inhaler 3  . albuterol (PROVENTIL) (2.5 MG/3ML) 0.083% nebulizer solution Take 3 mLs (2.5 mg total) by nebulization every 4 (four) hours as needed for wheezing or shortness of breath. 75 mL 5  . budesonide-formoterol (SYMBICORT) 160-4.5 MCG/ACT inhaler Inhale 2 puffs into the lungs 2 (two) times daily. 1 Inhaler 6  . fluticasone (FLONASE) 50 MCG/ACT nasal spray Place 2 sprays into both nostrils daily. 48 g 3  . hyoscyamine (LEVBID) 0.375 MG 12 hr tablet Take 0.375 mg by mouth daily as needed for cramping.   11  . lansoprazole (PREVACID) 15 MG capsule Take 15 mg by mouth daily.     . montelukast (SINGULAIR) 10 MG tablet TAKE 1 TABLET BY MOUTH EVERYDAY AT BEDTIME 90 tablet 0  . nystatin (MYCOSTATIN) 100000 UNIT/ML suspension Take 5 mLs (500,000 Units total) by mouth 4 (four) times daily. 60 mL 1  . tiZANidine (ZANAFLEX) 4 MG capsule Take 4 mg by mouth 3 (three) times daily as needed for muscle spasms.     No current facility-administered medications on file prior to visit.     PAST MEDICAL HISTORY: Past Medical History:  Diagnosis Date  . Allergy   . Asthma   . Back pain   . Diabetes ([place])   . Dyspnea   . GERD (gastroesophageal reflux disease)   . History of hiatal hernia   . Pneumonia    5 -7 yrs ago  . Swallowing difficulty     PAST SURGICAL HISTORY: Past Surgical History:  Procedure Laterality Date  . ANTERIOR CERVICAL DECOMP/DISCECTOMY FUSION N/A 07/14/2016   Procedure: Cervical five-six5- Cervical  six-seven Anterior cervical decompression/discectomy/fusion;  Surgeon: Erline Levine, MD;  Location: [facility];  Service: Neurosurgery;  Laterality: N/A;  . COLON SURGERY    . COLONOSCOPY  12/22/2014   per Dr. Earlean Shawl, clear, repeat in 5 yrs (hx of adenomas)   . DG THUMB LEFT HAND    . DG THUMB RIGHT HAND ([place] HX)    . FUNCTIONAL ENDOSCOPIC SINUS SURGERY    . PARTIAL COLECTOMY     sigmoid, Dr. Earlean Shawl    SOCIAL HISTORY: Social History   Tobacco Use  . Smoking status: Former Smoker    Packs/day: 1.00    Years: 10.00    Pack years: 10.00    Types: Cigarettes    Last attempt to quit: 01/31/1986    Years since quitting: 32.1  . Smokeless tobacco: Never Used  Substance Use Topics  . Alcohol use: Yes    Comment: rarely  . Drug use: No    FAMILY HISTORY: Family History  Problem Relation Age of Onset  . Cancer Mother   . Cancer Father   . Hyperlipidemia Father   . Diabetes Unknown  maternal family    ROS: Review of Systems  Constitutional: Positive for malaise/fatigue. Negative for weight loss.  Gastrointestinal: Negative for nausea and vomiting.  Musculoskeletal:       Negative muscle weakness  Endo/Heme/Allergies:       Negative hypoglycemia    PHYSICAL EXAM: Blood pressure (!) 149/69, pulse 70, temperature 98.1 F (36.7 C), temperature source Oral, height 5\' 10"  (1.778 m), weight 224 lb (101.6 kg), SpO2 98 %. Body mass index is 32.14 kg/m. Physical Exam Vitals signs reviewed.  Constitutional:      Appearance: Normal appearance. He is obese.  Cardiovascular:     Rate and Rhythm: Normal rate.     Pulses: Normal pulses.  Pulmonary:     Effort: Pulmonary effort is normal.     Breath sounds: Normal breath sounds.  Musculoskeletal: Normal range of motion.  Skin:    General: Skin is warm and dry.  Neurological:     Mental Status: He is alert and oriented to person, place, and time.  Psychiatric:        Mood and Affect: Mood normal.        Behavior: Behavior  normal.     RECENT LABS AND TESTS: BMET    Component Value Date/Time   NA 142 02/12/2018 1328   K 4.6 02/12/2018 1328   CL 104 02/12/2018 1328   CO2 23 02/12/2018 1328   GLUCOSE 110 (H) 02/12/2018 1328   GLUCOSE 126 (H) 07/31/2017 0947   BUN 15 02/12/2018 1328   CREATININE 1.00 02/12/2018 1328   CREATININE 1.05 04/24/2015 1428   CALCIUM 9.7 02/12/2018 1328   GFRNONAA 82 02/12/2018 1328   GFRNONAA 79 04/24/2015 1428   GFRAA 95 02/12/2018 1328   GFRAA >89 04/24/2015 1428   Lab Results  Component Value Date   HGBA1C 5.7 (H) 02/12/2018   HGBA1C 6.5 (H) 10/23/2017   HGBA1C 8.6 (H) 07/07/2017   Lab Results  Component Value Date   INSULIN 10.9 02/12/2018   INSULIN 14.1 10/23/2017   CBC    Component Value Date/Time   WBC 8.2 10/23/2017 0953   WBC 6.6 07/31/2017 0947   RBC 4.85 10/23/2017 0953   RBC 4.78 07/31/2017 0947   HGB 16.0 10/23/2017 0953   HCT 42.6 10/23/2017 0953   PLT 345.0 07/31/2017 0947   MCV 88 10/23/2017 0953   MCH 33.0 10/23/2017 0953   MCH 30.6 07/11/2016 1211   MCHC 37.6 (H) 10/23/2017 0953   MCHC 34.9 07/31/2017 0947   RDW 13.4 10/23/2017 0953   LYMPHSABS 2.2 10/23/2017 0953   MONOABS 0.4 07/31/2017 0947   EOSABS 0.5 (H) 10/23/2017 0953   BASOSABS 0.0 10/23/2017 0953   Iron/TIBC/Ferritin/ %Sat No results found for: IRON, TIBC, FERRITIN, IRONPCTSAT Lipid Panel     Component Value Date/Time   CHOL 143 10/23/2017 0953   TRIG 64 10/23/2017 0953   HDL 36 (L) 10/23/2017 0953   CHOLHDL 5 07/31/2017 0947   VLDL 20.4 07/31/2017 0947   LDLCALC 94 10/23/2017 0953   Hepatic Function Panel     Component Value Date/Time   PROT 6.9 02/12/2018 1328   ALBUMIN 4.3 02/12/2018 1328   AST 24 02/12/2018 1328   ALT 31 02/12/2018 1328   ALKPHOS 93 02/12/2018 1328   BILITOT 0.6 02/12/2018 1328   BILIDIR 0.1 07/31/2017 0947      Component Value Date/Time   TSH 0.695 10/23/2017 0953   TSH 1.04 07/31/2017 0947   TSH 0.41 06/19/2017 1544  OBESITY BEHAVIORAL INTERVENTION VISIT  Today's visit was # 10   Starting weight: 245 lbs Starting date: 10/23/17 Today's weight : 224 lbs  Today's date: 03/12/2018 Total lbs lost to date: 21    ASK: We discussed the diagnosis of obesity with Shirlean Schlein today and Legrand Como agreed to give [name] permission to discuss obesity behavioral modification therapy today.  ASSESS: Rikki has the diagnosis of obesity and his BMI today is 32.14 Cashis is in the action stage of change   ADVISE: Trevonte was educated on the multiple health risks of obesity as well as the benefit of weight loss to improve his health. He was advised of the need for long term treatment and the importance of lifestyle modifications to improve his current health and to decrease his risk of future health problems.  AGREE: Multiple dietary modification options and treatment options were discussed and  Kruze agreed to follow the recommendations documented in the above note.  ARRANGE: Bush was educated on the importance of frequent visits to treat obesity as outlined per CMS and USPSTF guidelines and agreed to schedule his next follow up appointment today.  I, Trixie Dredge, am acting as transcriptionist for Ilene Qua, MD  I have reviewed the above documentation for accuracy and completeness, and I agree with the above. - Ilene Qua, MD

## 2018-03-14 ENCOUNTER — Encounter: Payer: Self-pay | Admitting: Family Medicine

## 2018-03-14 ENCOUNTER — Ambulatory Visit: Payer: BLUE CROSS/BLUE SHIELD | Admitting: Family Medicine

## 2018-03-14 VITALS — BP 120/60 | HR 66 | Temp 98.7°F | Ht 70.0 in | Wt 229.8 lb

## 2018-03-14 DIAGNOSIS — S96911A Strain of unspecified muscle and tendon at ankle and foot level, right foot, initial encounter: Secondary | ICD-10-CM | POA: Diagnosis not present

## 2018-03-14 DIAGNOSIS — S76911A Strain of unspecified muscles, fascia and tendons at thigh level, right thigh, initial encounter: Secondary | ICD-10-CM | POA: Diagnosis not present

## 2018-03-14 DIAGNOSIS — Z23 Encounter for immunization: Secondary | ICD-10-CM | POA: Diagnosis not present

## 2018-03-14 MED ORDER — NAPROXEN 500 MG PO TABS: 500.0000 mg | ORAL_TABLET | Freq: Two times a day (BID) | ORAL | 1 refills | Status: DC

## 2018-03-14 NOTE — Progress Notes (Signed)
   Subjective:    Patient ID: Thomas [name], male    DOB: [birth year], 58 y.o.   MRN: [id number]  HPI Here for 3 days of mild pain in the right anterior thigh just above the knee and for pain in the right medial ankle. No redness or swelling or warmth. No recent trauma, but he does say he chnaged the batteries in the smoke detectors in his father's house last weekend this required climbing up and down on a step ladder. He has tried Tylenol with no relief.    Review of Systems  Constitutional: Negative.   Respiratory: Negative.   Cardiovascular: Negative.   Musculoskeletal: Positive for arthralgias.       Objective:   Physical Exam Constitutional:      General: He is not in acute distress.    Appearance: Normal appearance.  Cardiovascular:     Rate and Rhythm: Normal rate and regular rhythm.     Pulses: Normal pulses.     Heart sounds: Normal heart sounds.  Pulmonary:     Effort: Pulmonary effort is normal.     Breath sounds: Normal breath sounds.  Musculoskeletal:     Comments: He is mildly tender in the right thigh above the knee and along the right medial malleolus. ROM is full. There is no warmth or swelling or erythema  Neurological:     Mental Status: He is alert.           Assessment & Plan:  These are muscle strains that should resolve fairly quickly. Use moist heat. Switch to Naproxen 500 mg bid as needed.  Alysia Penna, MD

## 2018-04-02 ENCOUNTER — Encounter (INDEPENDENT_AMBULATORY_CARE_PROVIDER_SITE_OTHER): Payer: Self-pay | Admitting: Bariatrics

## 2018-04-02 ENCOUNTER — Ambulatory Visit (INDEPENDENT_AMBULATORY_CARE_PROVIDER_SITE_OTHER): Payer: BLUE CROSS/BLUE SHIELD | Admitting: Bariatrics

## 2018-04-02 VITALS — BP 108/67 | Temp 97.9°F | Ht 70.0 in | Wt 222.0 lb

## 2018-04-02 DIAGNOSIS — E669 Obesity, unspecified: Secondary | ICD-10-CM | POA: Diagnosis not present

## 2018-04-02 DIAGNOSIS — E119 Type 2 diabetes mellitus without complications: Secondary | ICD-10-CM

## 2018-04-02 DIAGNOSIS — E559 Vitamin D deficiency, unspecified: Secondary | ICD-10-CM | POA: Diagnosis not present

## 2018-04-02 DIAGNOSIS — Z6832 Body mass index (BMI) 32.0-32.9, adult: Secondary | ICD-10-CM | POA: Diagnosis not present

## 2018-04-02 NOTE — Progress Notes (Signed)
Office: [phone]  /  Fax: [phone]   HPI:   Chief Complaint: OBESITY Thomas Thomas [name] is here to discuss his progress with his obesity treatment plan. He is on the Category 4 plan and is following his eating plan approximately 90 % of the time. He states he is walking for 35 minutes 4 times per week. Thomas Thomas [name] is doing well overall. He started on a 21 day fasting (spiritual); Thomas Thomas [name]. His weight is 222 lb (100.7 kg) today and has had a weight loss of 2 pounds over a period of 3 weeks since his last visit. He has lost 23 lbs since starting treatment with [name].  Vitamin D deficiency Harm has a diagnosis of vitamin D deficiency. He is currently taking high dose vit D and denies nausea, vomiting or muscle weakness.  Diabetes II with hyperglycemia Thomas Thomas [name] has a diagnosis of diabetes type II. Thomas Thomas [name] denies any hypoglycemic episodes. Last A1c was at 5.7 and last insulin level was at 10.9 He has been working on intensive lifestyle modifications including diet, exercise, and weight loss to help control his blood glucose levels.  ASSESSMENT AND PLAN:  Vitamin D deficiency  Type 2 diabetes mellitus without complication, without long-term current use of insulin (HCC)  Class 1 obesity with serious comorbidity and body mass index (BMI) of 32.0 to 32.9 in adult, unspecified obesity type  PLAN:  Vitamin D Deficiency Thomas [name] was informed that low vitamin D levels contributes to fatigue and are associated with obesity, breast, and colon cancer. He agrees to continue to take prescription Vit D @50 ,000 IU every week and will follow up for routine testing of vitamin D, at least 2-3 times per year. He was informed of the risk of over-replacement of vitamin D and agrees to not increase his dose unless he discusses this with [name] first.  Diabetes II with hyperglycemia Thomas [name] has been given extensive diabetes education by myself today including ideal fasting and post-prandial blood glucose readings,  individual ideal Hgb A1c goals and hypoglycemia prevention. We discussed the importance of good blood sugar control to decrease the likelihood of diabetic complications such as nephropathy, neuropathy, limb loss, blindness, coronary artery disease, and death. We discussed the importance of intensive lifestyle modification including diet, exercise and weight loss as the first line treatment for diabetes. Thomas Thomas [name] will continue decreasing simple carbohydrates and increasing lean protein, but he will use meat substitutes at this time. Thomas Thomas [name] will follow up at the agreed upon time.  I spent > than 50% of the 15 minute visit on counseling as documented in the note.  Obesity Thomas Thomas [name] is currently in the action stage of change. As such, his goal is to continue with weight loss efforts He has agreed to follow the Category 3 plan Thomas Thomas [name] has been instructed to work up to a goal of 150 minutes of combined cardio and strengthening exercise per week for weight loss and overall health benefits. We discussed the following Behavioral Modification Strategies today: increase H2O intake, no skipping meals, keeping healthy foods in the home, increasing lean protein intake, decreasing simple carbohydrates, increasing vegetables, decrease eating out, work on meal planning and easy cooking plans and decrease liquid calories Thomas Thomas [name] will increase plant protein during the Thomas [name].   Thomas Thomas [name] has agreed to follow up with our clinic in 2 weeks. He was informed of the importance of frequent follow up visits to maximize his success with intensive lifestyle modifications for his multiple health conditions.  ALLERGIES: Allergies  Allergen Reactions  . No Known Allergies  MEDICATIONS: Current Outpatient Medications on File Prior to Visit  Medication Sig Dispense Refill  . albuterol (PROAIR HFA) 108 (90 Base) MCG/ACT inhaler INHALE 2 PUFFS EVERY 6 HOURS AS NEEDED FOR WHEEZING OR SHORTNESS OF BREATH. 3 Inhaler 3  . albuterol  (PROVENTIL) (2.5 MG/3ML) 0.083% nebulizer solution Take 3 mLs (2.5 mg total) by nebulization every 4 (four) hours as needed for wheezing or shortness of breath. 75 mL 5  . budesonide-formoterol (SYMBICORT) 160-4.5 MCG/ACT inhaler Inhale 2 puffs into the lungs 2 (two) times daily. 1 Inhaler 6  . fluticasone (FLONASE) 50 MCG/ACT nasal spray Place 2 sprays into both nostrils daily. 48 g 3  . hyoscyamine (LEVBID) 0.375 MG 12 hr tablet Take 0.375 mg by mouth daily as needed for cramping.   11  . lansoprazole (PREVACID) 15 MG capsule Take 15 mg by mouth daily.     . montelukast (SINGULAIR) 10 MG tablet TAKE 1 TABLET BY MOUTH EVERYDAY AT BEDTIME 90 tablet 0  . naproxen (NAPROSYN) 500 MG tablet Take 1 tablet (500 mg total) by mouth 2 (two) times daily with a meal. 60 tablet 1  . nystatin (MYCOSTATIN) 100000 UNIT/ML suspension Take 5 mLs (500,000 Units total) by mouth 4 (four) times daily. 60 mL 1  . tiZANidine (ZANAFLEX) 4 MG capsule Take 4 mg by mouth 3 (three) times daily as needed for muscle spasms.    . Vitamin D, Ergocalciferol, (DRISDOL) 1.25 MG (50000 UT) CAPS capsule Take 1 capsule (50,000 Units total) by mouth every 7 (seven) days. 4 capsule 0   No current facility-administered medications on file prior to visit.     PAST MEDICAL HISTORY: Past Medical History:  Diagnosis Date  . Allergy   . Asthma   . Back pain   . Diabetes ([place])   . Dyspnea   . GERD (gastroesophageal reflux disease)   . History of hiatal hernia   . Pneumonia    5 -7 yrs ago  . Swallowing difficulty     PAST SURGICAL HISTORY: Past Surgical History:  Procedure Laterality Date  . ANTERIOR CERVICAL DECOMP/DISCECTOMY FUSION N/A 07/14/2016   Procedure: Cervical five-six5- Cervical six-seven Anterior cervical decompression/discectomy/fusion;  Surgeon: Erline Levine, MD;  Location: [facility];  Service: Neurosurgery;  Laterality: N/A;  . COLON SURGERY    . COLONOSCOPY  12/22/2014   per Dr. Earlean Shawl, clear, repeat in 5 yrs (hx  of adenomas)   . DG THUMB LEFT HAND    . DG THUMB RIGHT HAND ([facility] HX)    . FUNCTIONAL ENDOSCOPIC SINUS SURGERY    . PARTIAL COLECTOMY     sigmoid, Dr. Earlean Shawl    SOCIAL HISTORY: Social History   Tobacco Use  . Smoking status: Former Smoker    Packs/day: 1.00    Years: 10.00    Pack years: 10.00    Types: Cigarettes    Last attempt to quit: 01/31/1986    Years since quitting: 32.1  . Smokeless tobacco: Never Used  Substance Use Topics  . Alcohol use: Yes    Comment: rarely  . Drug use: No    FAMILY HISTORY: Family History  Problem Relation Age of Onset  . Cancer Mother   . Cancer Father   . Hyperlipidemia Father   . Diabetes Unknown        maternal family    ROS: Review of Systems  Constitutional: Positive for weight loss.  Gastrointestinal: Negative for nausea and vomiting.  Musculoskeletal:       Negative for muscle weakness  Endo/Heme/Allergies:       Negative for hypoglycemia Positive for hyperglycemia    PHYSICAL EXAM: Blood pressure 108/67, temperature 97.9 F (36.6 C), temperature source Oral, height 5\' 10"  (1.778 m), weight 222 lb (100.7 kg), SpO2 98 %. Body mass index is 31.85 kg/m. Physical Exam Vitals signs reviewed.  Constitutional:      Appearance: Normal appearance. He is well-developed. He is obese.  Cardiovascular:     Rate and Rhythm: Normal rate.  Pulmonary:     Effort: Pulmonary effort is normal.  Musculoskeletal: Normal range of motion.  Skin:    General: Skin is warm and dry.  Neurological:     Mental Status: He is alert and oriented to person, place, and time.  Psychiatric:        Mood and Affect: Mood normal.        Behavior: Behavior normal.     RECENT LABS AND TESTS: BMET    Component Value Date/Time   NA 142 02/12/2018 1328   K 4.6 02/12/2018 1328   CL 104 02/12/2018 1328   CO2 23 02/12/2018 1328   GLUCOSE 110 (H) 02/12/2018 1328   GLUCOSE 126 (H) 07/31/2017 0947   BUN 15 02/12/2018 1328   CREATININE 1.00  02/12/2018 1328   CREATININE 1.05 04/24/2015 1428   CALCIUM 9.7 02/12/2018 1328   GFRNONAA 82 02/12/2018 1328   GFRNONAA 79 04/24/2015 1428   GFRAA 95 02/12/2018 1328   GFRAA >89 04/24/2015 1428   Lab Results  Component Value Date   HGBA1C 5.7 (H) 02/12/2018   HGBA1C 6.5 (H) 10/23/2017   HGBA1C 8.6 (H) 07/07/2017   Lab Results  Component Value Date   INSULIN 10.9 02/12/2018   INSULIN 14.1 10/23/2017   CBC    Component Value Date/Time   WBC 8.2 10/23/2017 0953   WBC 6.6 07/31/2017 0947   RBC 4.85 10/23/2017 0953   RBC 4.78 07/31/2017 0947   HGB 16.0 10/23/2017 0953   HCT 42.6 10/23/2017 0953   PLT 345.0 07/31/2017 0947   MCV 88 10/23/2017 0953   MCH 33.0 10/23/2017 0953   MCH 30.6 07/11/2016 1211   MCHC 37.6 (H) 10/23/2017 0953   MCHC 34.9 07/31/2017 0947   RDW 13.4 10/23/2017 0953   LYMPHSABS 2.2 10/23/2017 0953   MONOABS 0.4 07/31/2017 0947   EOSABS 0.5 (H) 10/23/2017 0953   BASOSABS 0.0 10/23/2017 0953   Iron/TIBC/Ferritin/ %Sat No results found for: IRON, TIBC, FERRITIN, IRONPCTSAT Lipid Panel     Component Value Date/Time   CHOL 143 10/23/2017 0953   TRIG 64 10/23/2017 0953   HDL 36 (L) 10/23/2017 0953   CHOLHDL 5 07/31/2017 0947   VLDL 20.4 07/31/2017 0947   LDLCALC 94 10/23/2017 0953   Hepatic Function Panel     Component Value Date/Time   PROT 6.9 02/12/2018 1328   ALBUMIN 4.3 02/12/2018 1328   AST 24 02/12/2018 1328   ALT 31 02/12/2018 1328   ALKPHOS 93 02/12/2018 1328   BILITOT 0.6 02/12/2018 1328   BILIDIR 0.1 07/31/2017 0947      Component Value Date/Time   TSH 0.695 10/23/2017 0953   TSH 1.04 07/31/2017 0947   TSH 0.41 06/19/2017 1544     Ref. Range 02/12/2018 13:28  Vitamin D, 25-Hydroxy Latest Ref Range: 30.0 - 100.0 ng/mL 56.1     OBESITY BEHAVIORAL INTERVENTION VISIT  Today's visit was # 11   Starting weight: 245 lbs Starting date: 10/23/2017 Today's weight : 222 lbs Today's date: 04/02/2018 Total lbs  lost to date: 23     04/02/2018  Height 5\' 10"  (1.778 m)  Weight 222 lb (100.7 kg)  BMI (Calculated) 31.85  BLOOD PRESSURE - SYSTOLIC 638  BLOOD PRESSURE - DIASTOLIC 67   Body Fat % 46.6 %  Total Body Water (lbs) 107.4 lbs    ASK: We discussed the diagnosis of obesity with Shirlean Schlein today and Legrand Como agreed to give [name] permission to discuss obesity behavioral modification therapy today.  ASSESS: Jyquan has the diagnosis of obesity and his BMI today is 31.85 Dannon is in the action stage of change   ADVISE: Drequan was educated on the multiple health risks of obesity as well as the benefit of weight loss to improve his health. He was advised of the need for long term treatment and the importance of lifestyle modifications to improve his current health and to decrease his risk of future health problems.  AGREE: Multiple dietary modification options and treatment options were discussed and  Quency agreed to follow the recommendations documented in the above note.  ARRANGE: Azaryah was educated on the importance of frequent visits to treat obesity as outlined per CMS and USPSTF guidelines and agreed to schedule his next follow up appointment today.  Corey Skains, am acting as Location manager for [employer]. Owens Shark, DO  I have reviewed the above documentation for accuracy and completeness, and I agree with the above. -Jearld Lesch, DO

## 2018-04-16 ENCOUNTER — Encounter (INDEPENDENT_AMBULATORY_CARE_PROVIDER_SITE_OTHER): Payer: Self-pay | Admitting: Bariatrics

## 2018-04-16 ENCOUNTER — Ambulatory Visit (INDEPENDENT_AMBULATORY_CARE_PROVIDER_SITE_OTHER): Payer: BLUE CROSS/BLUE SHIELD | Admitting: Bariatrics

## 2018-04-16 ENCOUNTER — Other Ambulatory Visit: Payer: Self-pay

## 2018-04-16 VITALS — BP 138/68 | HR 69 | Temp 98.0°F | Ht 70.0 in | Wt 222.0 lb

## 2018-04-16 DIAGNOSIS — E559 Vitamin D deficiency, unspecified: Secondary | ICD-10-CM

## 2018-04-16 DIAGNOSIS — E669 Obesity, unspecified: Secondary | ICD-10-CM | POA: Diagnosis not present

## 2018-04-16 DIAGNOSIS — Z6831 Body mass index (BMI) 31.0-31.9, adult: Secondary | ICD-10-CM

## 2018-04-16 DIAGNOSIS — E119 Type 2 diabetes mellitus without complications: Secondary | ICD-10-CM | POA: Diagnosis not present

## 2018-04-16 DIAGNOSIS — Z794 Long term (current) use of insulin: Secondary | ICD-10-CM

## 2018-04-17 DIAGNOSIS — E559 Vitamin D deficiency, unspecified: Secondary | ICD-10-CM | POA: Insufficient documentation

## 2018-04-17 NOTE — Progress Notes (Signed)
Office: [phone]  /  Fax: [phone]   HPI:   Chief Complaint: OBESITY Thomas [name] is here to discuss his progress with his obesity treatment plan. He is on the Category 3 plan and is following his eating plan approximately 60 % of the time. He states he is walking for 35 minutes 4 times per week. Thomas [name] [name] been on a 21 day fasting (spiritual). He is doing the "[employer] His weight [name] stayed the same. He finishes his fast on 04/17/2018.  Fahim will keep his soy to a limit. His goal weight is 217. His weight is 222 lb (100.7 kg) today and he [name] maintained weight over a period of 2 weeks since his last visit. He [name] lost 23 lbs since starting treatment with [name].  Vitamin D deficiency Thomas [name] [name] a diagnosis of vitamin D deficiency. He is currently taking high dose vit D and denies nausea, vomiting or muscle weakness.  Diabetes II Thomas [name] [name] a diagnosis of diabetes type II. Thomas [name] is now in the prediabetic range. Last A1c was at 5.7 and last insulin level was at 10.9 He [name] been working on intensive lifestyle modifications including diet, exercise, and weight loss to help control his blood glucose levels.  ASSESSMENT AND PLAN:  Vitamin D deficiency  Type 2 diabetes mellitus without complication, with long-term current use of insulin (HCC)  Class 1 obesity with serious comorbidity and body mass index (BMI) of 31.0 to 31.9 in adult, unspecified obesity type  PLAN:  Vitamin D Deficiency Thomas [name] was informed that low vitamin D levels contributes to fatigue and are associated with obesity, breast, and colon cancer. He agrees to continue to take high dose prescription Vit D @50 ,000 IU and take every other week and will follow up for routine testing of vitamin D, at least 2-3 times per year. He was informed of the risk of over-replacement of vitamin D and agrees to not increase his dose unless he discusses this with [name] first.   Diabetes II Thomas [name] [name] been given extensive  diabetes education by myself today including ideal fasting and post-prandial blood glucose readings, individual ideal Hgb A1c goals  and hypoglycemia prevention. We discussed the importance of good blood sugar control to decrease the likelihood of diabetic complications such as nephropathy, neuropathy, limb loss, blindness, coronary artery disease, and death. We discussed the importance of intensive lifestyle modification including diet, exercise and weight loss as the first line treatment for diabetes. Thomas [name] agrees to continue increasing exercise and work on weight loss. Thomas [name] will follow up at the agreed upon time.  Obesity Thomas [name] is currently in the action stage of change. As such, his goal is to continue with weight loss efforts He [name] agreed to follow the Category 3 plan Thomas [name] will continue his walking regimen for weight loss and overall health benefits. We discussed the following Behavioral Modification Strategies today: increase H2O intake, increasing lean protein intake, decreasing simple carbohydrates and increasing vegetables  Thomas [name] agreed to follow up with our clinic in 2 weeks. He was informed of the importance of frequent follow up visits to maximize his success with intensive lifestyle modifications for his multiple health conditions.  ALLERGIES: Allergies  Allergen Reactions  . No Known Allergies     MEDICATIONS: Current Outpatient Medications on File Prior to Visit  Medication Sig Dispense Refill  . albuterol (PROAIR HFA) 108 (90 Base) MCG/ACT inhaler INHALE 2 PUFFS EVERY 6 HOURS AS NEEDED FOR WHEEZING OR SHORTNESS OF BREATH. 3 Inhaler 3  .  albuterol (PROVENTIL) (2.5 MG/3ML) 0.083% nebulizer solution Take 3 mLs (2.5 mg total) by nebulization every 4 (four) hours as needed for wheezing or shortness of breath. 75 mL 5  . budesonide-formoterol (SYMBICORT) 160-4.5 MCG/ACT inhaler Inhale 2 puffs into the lungs 2 (two) times daily. 1 Inhaler 6  . fluticasone (FLONASE) 50  MCG/ACT nasal spray Place 2 sprays into both nostrils daily. 48 g 3  . hyoscyamine (LEVBID) 0.375 MG 12 hr tablet Take 0.375 mg by mouth daily as needed for cramping.   11  . lansoprazole (PREVACID) 15 MG capsule Take 15 mg by mouth daily.     . montelukast (SINGULAIR) 10 MG tablet TAKE 1 TABLET BY MOUTH EVERYDAY AT BEDTIME 90 tablet 0  . naproxen (NAPROSYN) 500 MG tablet Take 1 tablet (500 mg total) by mouth 2 (two) times daily with a meal. 60 tablet 1  . nystatin (MYCOSTATIN) 100000 UNIT/ML suspension Take 5 mLs (500,000 Units total) by mouth 4 (four) times daily. 60 mL 1  . tiZANidine (ZANAFLEX) 4 MG capsule Take 4 mg by mouth 3 (three) times daily as needed for muscle spasms.    . Vitamin D, Ergocalciferol, (DRISDOL) 1.25 MG (50000 UT) CAPS capsule Take 1 capsule (50,000 Units total) by mouth every 7 (seven) days. 4 capsule 0   No current facility-administered medications on file prior to visit.     PAST MEDICAL HISTORY: Past Medical History:  Diagnosis Date  . Allergy   . Asthma   . Back pain   . Diabetes ([place])   . Dyspnea   . GERD (gastroesophageal reflux disease)   . History of hiatal hernia   . Pneumonia    5 -7 yrs ago  . Swallowing difficulty     PAST SURGICAL HISTORY: Past Surgical History:  Procedure Laterality Date  . ANTERIOR CERVICAL DECOMP/DISCECTOMY FUSION N/A 07/14/2016   Procedure: Cervical five-six5- Cervical six-seven Anterior cervical decompression/discectomy/fusion;  Surgeon: Erline Levine, MD;  Location: [facility];  Service: Neurosurgery;  Laterality: N/A;  . COLON SURGERY    . COLONOSCOPY  12/22/2014   per Dr. Earlean Shawl, clear, repeat in 5 yrs (hx of adenomas)   . DG THUMB LEFT HAND    . DG THUMB RIGHT HAND ([place] [facility]X)    . FUNCTIONAL ENDOSCOPIC SINUS SURGERY    . PARTIAL COLECTOMY     sigmoid, Dr. Earlean Shawl    SOCIAL HISTORY: Social History   Tobacco Use  . Smoking status: Former Smoker    Packs/day: 1.00    Years: 10.00    Pack years: 10.00    Types:  Cigarettes    Last attempt to quit: 01/31/1986    Years since quitting: 32.2  . Smokeless tobacco: Never Used  Substance Use Topics  . Alcohol use: Yes    Comment: rarely  . Drug use: No    FAMILY HISTORY: Family History  Problem Relation Age of Onset  . Cancer Mother   . Cancer Father   . Hyperlipidemia Father   . Diabetes Unknown        maternal family    ROS: Review of Systems  Constitutional: Negative for weight loss.  Gastrointestinal: Negative for nausea and vomiting.  Musculoskeletal:       Negative for muscle weakness    PHYSICAL EXAM: Blood pressure 138/68, pulse 69, temperature 98 F (36.7 C), temperature source Oral, height 5\' 10"  (1.778 m), weight 222 lb (100.7 kg), SpO2 97 %. Body mass index is 31.85 kg/m. Physical Exam Vitals signs reviewed.  Constitutional:      Appearance: Normal appearance. He is well-developed. He is obese.  Cardiovascular:     Rate and Rhythm: Normal rate.  Pulmonary:     Effort: Pulmonary effort is normal.  Musculoskeletal: Normal range of motion.  Skin:    General: Skin is warm and dry.  Neurological:     Mental Status: He is alert and oriented to person, place, and time.  Psychiatric:        Mood and Affect: Mood normal.        Behavior: Behavior normal.     RECENT LABS AND TESTS: BMET    Component Value Date/Time   NA 142 02/12/2018 1328   K 4.6 02/12/2018 1328   CL 104 02/12/2018 1328   CO2 23 02/12/2018 1328   GLUCOSE 110 (H) 02/12/2018 1328   GLUCOSE 126 (H) 07/31/2017 0947   BUN 15 02/12/2018 1328   CREATININE 1.00 02/12/2018 1328   CREATININE 1.05 04/24/2015 1428   CALCIUM 9.7 02/12/2018 1328   GFRNONAA 82 02/12/2018 1328   GFRNONAA 79 04/24/2015 1428   GFRAA 95 02/12/2018 1328   GFRAA >89 04/24/2015 1428   Lab Results  Component Value Date   HGBA1C 5.7 (H) 02/12/2018   HGBA1C 6.5 (H) 10/23/2017   HGBA1C 8.6 (H) 07/07/2017   Lab Results  Component Value Date   INSULIN 10.9 02/12/2018   INSULIN  14.1 10/23/2017   CBC    Component Value Date/Time   WBC 8.2 10/23/2017 0953   WBC 6.6 07/31/2017 0947   RBC 4.85 10/23/2017 0953   RBC 4.78 07/31/2017 0947   HGB 16.0 10/23/2017 0953   HCT 42.6 10/23/2017 0953   PLT 345.0 07/31/2017 0947   MCV 88 10/23/2017 0953   MCH 33.0 10/23/2017 0953   MCH 30.6 07/11/2016 1211   MCHC 37.6 (H) 10/23/2017 0953   MCHC 34.9 07/31/2017 0947   RDW 13.4 10/23/2017 0953   LYMPHSABS 2.2 10/23/2017 0953   MONOABS 0.4 07/31/2017 0947   EOSABS 0.5 (H) 10/23/2017 0953   BASOSABS 0.0 10/23/2017 0953   Iron/TIBC/Ferritin/ %Sat No results found for: IRON, TIBC, FERRITIN, IRONPCTSAT Lipid Panel     Component Value Date/Time   CHOL 143 10/23/2017 0953   TRIG 64 10/23/2017 0953   HDL 36 (L) 10/23/2017 0953   CHOLHDL 5 07/31/2017 0947   VLDL 20.4 07/31/2017 0947   LDLCALC 94 10/23/2017 0953   Hepatic Function Panel     Component Value Date/Time   PROT 6.9 02/12/2018 1328   ALBUMIN 4.3 02/12/2018 1328   AST 24 02/12/2018 1328   ALT 31 02/12/2018 1328   ALKPHOS 93 02/12/2018 1328   BILITOT 0.6 02/12/2018 1328   BILIDIR 0.1 07/31/2017 0947      Component Value Date/Time   TSH 0.695 10/23/2017 0953   TSH 1.04 07/31/2017 0947   TSH 0.41 06/19/2017 1544     Ref. Range 02/12/2018 13:28  Vitamin D, 25-Hydroxy Latest Ref Range: 30.0 - 100.0 ng/mL 56.1     OBESITY BEHAVIORAL INTERVENTION VISIT  Today's visit was # 12   Starting weight: 245 lbs Starting date: 10/23/2017 Today's weight : 222 lbs  Today's date: 04/16/2018 Total lbs lost to date: 23    04/16/2018  Height 5\' 10"  (1.778 m)  Weight 222 lb (100.7 kg)  BMI (Calculated) 31.85  BLOOD PRESSURE - SYSTOLIC 696  BLOOD PRESSURE - DIASTOLIC 68   Body Fat % 29.5 %  Total Body Water (lbs) 106.6 lbs  ASK: We discussed the diagnosis of obesity with Thomas [name] today and Thomas [name] agreed to give [name] permission to discuss obesity behavioral modification therapy today.  ASSESS:  Thomas [name] [name] the diagnosis of obesity and his BMI today is 31.85 Thomas [name] is in the action stage of change   ADVISE: Thomas [name] was educated on the multiple health risks of obesity as well as the benefit of weight loss to improve his health. He was advised of the need for long term treatment and the importance of lifestyle modifications to improve his current health and to decrease his risk of future health problems.  AGREE: Multiple dietary modification options and treatment options were discussed and  Thomas [name] agreed to follow the recommendations documented in the above note.  ARRANGE: Thomas [name] was educated on the importance of frequent visits to treat obesity as outlined per CMS and USPSTF guidelines and agreed to schedule his next follow up appointment today.  Corey Skains, am acting as Location manager for [employer]. Owens Shark, DO  I have reviewed the above documentation for accuracy and completeness, and I agree with the above. -Jearld Lesch, DO

## 2018-04-24 ENCOUNTER — Encounter (INDEPENDENT_AMBULATORY_CARE_PROVIDER_SITE_OTHER): Payer: Self-pay

## 2018-05-06 ENCOUNTER — Other Ambulatory Visit: Payer: Self-pay | Admitting: Family Medicine

## 2018-05-07 ENCOUNTER — Ambulatory Visit (INDEPENDENT_AMBULATORY_CARE_PROVIDER_SITE_OTHER): Payer: BLUE CROSS/BLUE SHIELD | Admitting: Bariatrics

## 2018-05-16 ENCOUNTER — Other Ambulatory Visit: Payer: Self-pay | Admitting: Pulmonary Disease

## 2018-05-28 ENCOUNTER — Telehealth: Payer: Self-pay | Admitting: Pulmonary Disease

## 2018-05-28 NOTE — Telephone Encounter (Signed)
Called [facility] to verify status of 10 mg Singulair. Technician status it has been on back order for several weeks. She says it manifest shows it should be there today. Called patient and made aware. Asked that he check back with [facility] this afternoon then call office back and let us know if we need to send rx to another pharmacy. Pt verbalized understanding.

## 2018-05-29 ENCOUNTER — Telehealth: Payer: Self-pay | Admitting: Pulmonary Disease

## 2018-05-29 MED ORDER — MONTELUKAST SODIUM 10 MG PO TABS: ORAL_TABLET | ORAL | 1 refills | Status: DC

## 2018-05-29 NOTE — Telephone Encounter (Signed)
Called and spoke with patient.  Patient requested a refill for Singulair to be sent to [facility].  Singulair refill sent to requested pharmacy.  Nothing further at this time.

## 2018-06-18 ENCOUNTER — Other Ambulatory Visit (INDEPENDENT_AMBULATORY_CARE_PROVIDER_SITE_OTHER): Payer: Self-pay | Admitting: Family Medicine

## 2018-06-18 DIAGNOSIS — E559 Vitamin D deficiency, unspecified: Secondary | ICD-10-CM

## 2018-06-28 ENCOUNTER — Other Ambulatory Visit: Payer: Self-pay | Admitting: Pulmonary Disease

## 2018-09-19 ENCOUNTER — Other Ambulatory Visit: Payer: Self-pay | Admitting: Pulmonary Disease

## 2018-10-07 ENCOUNTER — Other Ambulatory Visit: Payer: Self-pay | Admitting: Family Medicine

## 2018-10-07 DIAGNOSIS — B9689 Other specified bacterial agents as the cause of diseases classified elsewhere: Secondary | ICD-10-CM

## 2018-12-18 ENCOUNTER — Other Ambulatory Visit: Payer: Self-pay | Admitting: Pulmonary Disease

## 2018-12-19 ENCOUNTER — Telehealth: Payer: Self-pay | Admitting: Pulmonary Disease

## 2018-12-19 MED ORDER — MONTELUKAST SODIUM 10 MG PO TABS: 10.0000 mg | ORAL_TABLET | Freq: Every day | ORAL | 0 refills | Status: DC

## 2018-12-19 MED ORDER — BUDESONIDE-FORMOTEROL FUMARATE 160-4.5 MCG/ACT IN AERO: INHALATION_SPRAY | RESPIRATORY_TRACT | 0 refills | Status: DC

## 2018-12-19 NOTE — Telephone Encounter (Signed)
Rxs were refilled  Spoke with the pt and notified that this was done and to keep planned appt with Dr Vaughan Browner for 01/03/19  He verbalized understanding and nothing further needed

## 2018-12-24 ENCOUNTER — Other Ambulatory Visit: Payer: Self-pay

## 2018-12-25 ENCOUNTER — Encounter: Payer: Self-pay | Admitting: Family Medicine

## 2018-12-25 ENCOUNTER — Ambulatory Visit (INDEPENDENT_AMBULATORY_CARE_PROVIDER_SITE_OTHER): Payer: BC Managed Care – PPO | Admitting: Family Medicine

## 2018-12-25 VITALS — BP 130/80 | HR 52 | Temp 98.2°F | Ht 70.0 in | Wt 258.0 lb

## 2018-12-25 DIAGNOSIS — R5383 Other fatigue: Secondary | ICD-10-CM | POA: Diagnosis not present

## 2018-12-25 LAB — CBC WITH DIFFERENTIAL/PLATELET
Basophils Absolute: 0.1 10*3/uL (ref 0.0–0.1)
Basophils Relative: 0.9 % (ref 0.0–3.0)
Eosinophils Absolute: 0.1 10*3/uL (ref 0.0–0.7)
Eosinophils Relative: 2.3 % (ref 0.0–5.0)
HCT: 43.9 % (ref 39.0–52.0)
Hemoglobin: 15.2 g/dL (ref 13.0–17.0)
Lymphocytes Relative: 31.9 % (ref 12.0–46.0)
Lymphs Abs: 2.1 10*3/uL (ref 0.7–4.0)
MCHC: 34.6 g/dL (ref 30.0–36.0)
MCV: 91.7 fl (ref 78.0–100.0)
Monocytes Absolute: 0.5 10*3/uL (ref 0.1–1.0)
Monocytes Relative: 7.8 % (ref 3.0–12.0)
Neutro Abs: 3.8 10*3/uL (ref 1.4–7.7)
Neutrophils Relative %: 57.1 % (ref 43.0–77.0)
Platelets: 275 10*3/uL (ref 150.0–400.0)
RBC: 4.78 Mil/uL (ref 4.22–5.81)
RDW: 13.4 % (ref 11.5–15.5)
WBC: 6.6 10*3/uL (ref 4.0–10.5)

## 2018-12-25 LAB — HEPATIC FUNCTION PANEL
ALT: 34 U/L (ref 0–53)
AST: 24 U/L (ref 0–37)
Albumin: 4.3 g/dL (ref 3.5–5.2)
Alkaline Phosphatase: 78 U/L (ref 39–117)
Bilirubin, Direct: 0.1 mg/dL (ref 0.0–0.3)
Total Bilirubin: 0.7 mg/dL (ref 0.2–1.2)
Total Protein: 7.2 g/dL (ref 6.0–8.3)

## 2018-12-25 LAB — POCT URINALYSIS DIPSTICK
Bilirubin, UA: NEGATIVE
Blood, UA: NEGATIVE
Glucose, UA: NEGATIVE
Ketones, UA: NEGATIVE
Leukocytes, UA: NEGATIVE
Nitrite, UA: NEGATIVE
Protein, UA: POSITIVE — AB
Spec Grav, UA: 1.02 (ref 1.010–1.025)
Urobilinogen, UA: 0.2 E.U./dL
pH, UA: 6 (ref 5.0–8.0)

## 2018-12-25 LAB — BASIC METABOLIC PANEL
BUN: 13 mg/dL (ref 6–23)
CO2: 30 mEq/L (ref 19–32)
Calcium: 9.9 mg/dL (ref 8.4–10.5)
Chloride: 102 mEq/L (ref 96–112)
Creatinine, Ser: 1.02 mg/dL (ref 0.40–1.50)
GFR: 90.07 mL/min (ref >60.00)
Glucose, Bld: 94 mg/dL (ref 70–99)
Potassium: 4.4 mEq/L (ref 3.5–5.1)
Sodium: 139 mEq/L (ref 135–145)

## 2018-12-25 LAB — TSH: TSH: 0.87 u[IU]/mL (ref 0.35–4.50)

## 2018-12-25 LAB — HEMOGLOBIN A1C: Hgb A1c MFr Bld: 6 % (ref 4.6–6.5)

## 2018-12-25 NOTE — Patient Instructions (Signed)
Health Maintenance Due  Topic Date Due  . Hepatitis C Screening  [birth year]  . PNEUMOCOCCAL POLYSACCHARIDE VACCINE AGE 60-64 HIGH RISK  10/03/1960  . FOOT EXAM  10/03/1968  . OPHTHALMOLOGY EXAM  10/03/1968  . HIV Screening  10/03/1973  . HEMOGLOBIN A1C  08/13/2018  . INFLUENZA VACCINE  09/01/2018  . URINE MICROALBUMIN  10/24/2018    Depression screen [facility] 2/9 10/23/2017 08/12/2017 08/17/2016  Decreased Interest 1 0 0  Down, Depressed, Hopeless 0 0 0  PHQ - 2 Score 1 0 0  Altered sleeping 1 - 0  Tired, decreased energy 1 - 0  Change in appetite 0 - 0  Feeling bad or failure about yourself  0 - 0  Trouble concentrating 0 - 0  Moving slowly or fidgety/restless 0 - 0  Suicidal thoughts 0 - 0  PHQ-9 Score 3 - 0  Difficult doing work/chores Not difficult at all - -

## 2018-12-25 NOTE — Progress Notes (Signed)
   Subjective:    Patient ID: Thomas [name], male    DOB: July 12, 1960, 58 y.o.   MRN: [id number]  HPI Here for 3 days of feeling fatigued and light headed. No fever or body aches. No cough or SOB. No abdominal pain or NVD. He says his urine smells a bit stronger than usual but there is no burning or urgency.    Review of Systems  Constitutional: Positive for fatigue. Negative for appetite change, chills, diaphoresis and fever.  HENT: Negative.   Eyes: Negative.   Respiratory: Negative.   Cardiovascular: Negative.   Gastrointestinal: Negative.   Genitourinary: Negative for dysuria, flank pain, frequency, hematuria and urgency.  Neurological: Positive for light-headedness. Negative for dizziness, tremors, seizures, syncope, facial asymmetry, speech difficulty, weakness, numbness and headaches.       Objective:   Physical Exam Constitutional:      Appearance: Normal appearance. He is not ill-appearing.  Cardiovascular:     Rate and Rhythm: Normal rate and regular rhythm.     Pulses: Normal pulses.     Heart sounds: Normal heart sounds.  Pulmonary:     Effort: Pulmonary effort is normal.     Breath sounds: Normal breath sounds.  Abdominal:     General: Abdomen is flat. Bowel sounds are normal. There is no distension.     Palpations: Abdomen is soft. There is no mass.     Tenderness: There is no abdominal tenderness. There is no guarding or rebound.     Hernia: No hernia is present.  Musculoskeletal:     Right lower leg: No edema.     Left lower leg: No edema.  Lymphadenopathy:     Cervical: No cervical adenopathy.  Neurological:     General: No focal deficit present.     Mental Status: He is alert and oriented to person, place, and time.           Assessment & Plan:  Fatigue and light headedness of uncertain etiology. He may be dehydrated so I encouraged him to drink plenty of water. We will get labs today. He will return of anything changes. Alysia Penna, MD

## 2019-01-03 ENCOUNTER — Other Ambulatory Visit: Payer: Self-pay

## 2019-01-03 ENCOUNTER — Encounter: Payer: Self-pay | Admitting: Pulmonary Disease

## 2019-01-03 ENCOUNTER — Ambulatory Visit (INDEPENDENT_AMBULATORY_CARE_PROVIDER_SITE_OTHER): Payer: BC Managed Care – PPO | Admitting: Pulmonary Disease

## 2019-01-03 VITALS — BP 120/82 | HR 83 | Temp 97.7°F | Ht 70.0 in | Wt 263.6 lb

## 2019-01-03 DIAGNOSIS — Z23 Encounter for immunization: Secondary | ICD-10-CM

## 2019-01-03 DIAGNOSIS — J4540 Moderate persistent asthma, uncomplicated: Secondary | ICD-10-CM | POA: Diagnosis not present

## 2019-01-03 NOTE — Patient Instructions (Signed)
Glad you are doing well with regard to your breathing Continue the Symbicort and Singulair You can check with your pharmacist or your insurance company if there is a cheaper alternative ordered Thomas [name] that they would prefer Follow-up in 6 months.

## 2019-01-03 NOTE — Progress Notes (Signed)
Thomas [name]    [id number]    1960/12/22  Primary Care Physician:Fry, Ishmael Holter, MD  Referring Physician: Laurey Morale, MD [place],  [place] [address]  Chief complaint: Follow up for moderate persistent asthma, nasal polyposis, chronic sinusitis.  HPI: 58 year old with history of allergies, asthma, diabetes, GERD, dyspnea Complains of occasional dyspnea, productive cough with yellow mucus and wheezing for the past 1 year.  Seen by his primary care on 10/14 and given Z-Pak and referred to pulmonary.  Maintained on Qvar and albuterol.  He is using albuterol up to 3 times a day, has daily nighttime awakening Follows with Dr. 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   Underwent FEES in April 2019.  He saw Dr. [name] today who got sinus cultures showing coag negative staph.  Continues on Flonase. He was on aspirin for chronic pain but is not using it anymore.  Started on Symbicort and Singulair in Oct 2019 with significant improvement in symptoms  Pets: Used to have a dog, no cats, birds, farm animals Occupation: Works as a Administrator for [employer], Theme park manager Exposures: No known exposures, mold mold, hot tub, Jacuzzi Smoking history: 10-pack-year smoker.  Quit in 1990 Travel history: Drives to [place] as a Administrator.  No other significant travel. Relevant family history: No significant family history of lung disease.  Interim history: Continues on Symbicort, Singulair States that breathing is doing well with no issues.  Hardly needs to use his rescue inhaler  Wants to find out if there is a cheaper alternative to Symbicort.  Outpatient Encounter Medications as of 01/03/2019  Medication Sig  . albuterol (PROAIR HFA) 108 (90 Base) MCG/ACT inhaler INHALE 2 PUFFS EVERY 6 HOURS AS NEEDED FOR WHEEZING OR SHORTNESS OF BREATH.  Marland Kitchen albuterol (PROVENTIL) (2.5 MG/3ML) 0.083% nebulizer solution Take 3 mLs (2.5 mg total) by  nebulization every 4 (four) hours as needed for wheezing or shortness of breath.  . budesonide-formoterol (SYMBICORT) 160-4.5 MCG/ACT inhaler TAKE 2 PUFFS BY MOUTH TWICE A DAY  . fluticasone (FLONASE) 50 MCG/ACT nasal spray SPRAY 2 SPRAYS INTO EACH NOSTRIL EVERY DAY  . hyoscyamine (LEVBID) 0.375 MG 12 hr tablet Take 0.375 mg by mouth daily as needed for cramping.   . lansoprazole (PREVACID) 15 MG capsule Take 15 mg by mouth daily.   . montelukast (SINGULAIR) 10 MG tablet Take 1 tablet (10 mg total) by mouth at bedtime.  . naproxen (NAPROSYN) 500 MG tablet TAKE 1 TABLET BY MOUTH TIMES DAILY WITH A MEAL.  . [DISCONTINUED] montelukast (SINGULAIR) 10 MG tablet TAKE 1 TABLET BY MOUTH EVERYDAY AT BEDTIME  . [DISCONTINUED] nystatin (MYCOSTATIN) 100000 UNIT/ML suspension Take 5 mLs (500,000 Units total) by mouth 4 (four) times daily.  . [DISCONTINUED] tiZANidine (ZANAFLEX) 4 MG capsule Take 4 mg by mouth 3 (three) times daily as needed for muscle spasms.  . [DISCONTINUED] Vitamin D, Ergocalciferol, (DRISDOL) 1.25 MG (50000 UT) CAPS capsule Take 1 capsule (50,000 Units total) by mouth every 7 (seven) days.   No facility-administered encounter medications on file as of 01/03/2019.    Physical Exam: Blood pressure 120/82, pulse 83, temperature 97.7 F (36.5 C), temperature source Temporal, height 5\' 10"  (1.778 m), weight 263 lb 9.6 oz (119.6 kg), SpO2 98 %. Gen:      No acute distress HEENT:  EOMI, sclera anicteric Neck:     No masses; no thyromegaly Lungs:    Clear to auscultation bilaterally;  normal respiratory effort CV:         Regular rate and rhythm; no murmurs Abd:      + bowel sounds; soft, non-tender; no palpable masses, no distension Ext:    No edema; adequate peripheral perfusion Skin:      Warm and dry; no rash Neuro: alert and oriented x 3 Psych: normal mood and affect  Data Reviewed: Imaging: Chest x-ray 08/12/2017- chronic bronchitic changes, no acute cardiopulmonary ab normalities.   I have reviewed the images personally.  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  PFTs show mild restriction but there is no evidence of interstitial lung disease on chest x-ray.  Symptoms have stabilized with Symbicort, Singulair. I have advised him to check with his pharmacy if generic inhaler but got better for him Continue current therapy. Advised him to avoid aspirin and NSAIDs   Plan/Recommendations: - Continue Symbicort, Singulair. - Albuterol rescue inhaler  Marshell Garfinkel MD [facility] 01/03/2019, 12:00 PM  CC: Laurey Morale, MD

## 2019-01-10 ENCOUNTER — Other Ambulatory Visit: Payer: Self-pay

## 2019-01-10 ENCOUNTER — Ambulatory Visit (INDEPENDENT_AMBULATORY_CARE_PROVIDER_SITE_OTHER): Payer: BC Managed Care – PPO | Admitting: Bariatrics

## 2019-01-10 ENCOUNTER — Encounter (INDEPENDENT_AMBULATORY_CARE_PROVIDER_SITE_OTHER): Payer: Self-pay | Admitting: Bariatrics

## 2019-01-10 VITALS — BP 136/80 | HR 95 | Temp 98.5°F | Ht 70.0 in | Wt 257.0 lb

## 2019-01-10 DIAGNOSIS — E119 Type 2 diabetes mellitus without complications: Secondary | ICD-10-CM

## 2019-01-10 DIAGNOSIS — E559 Vitamin D deficiency, unspecified: Secondary | ICD-10-CM | POA: Diagnosis not present

## 2019-01-10 DIAGNOSIS — K219 Gastro-esophageal reflux disease without esophagitis: Secondary | ICD-10-CM | POA: Diagnosis not present

## 2019-01-10 DIAGNOSIS — Z6837 Body mass index (BMI) 37.0-37.9, adult: Secondary | ICD-10-CM

## 2019-01-14 ENCOUNTER — Encounter (INDEPENDENT_AMBULATORY_CARE_PROVIDER_SITE_OTHER): Payer: Self-pay | Admitting: Bariatrics

## 2019-01-14 NOTE — Progress Notes (Signed)
Office: [phone]  /  Fax: [phone]   HPI:  Chief Complaint: OBESITY Thomas [name] is here to discuss his progress with his obesity treatment plan. He is on the Category 3 plan and states he is following his eating plan approximately 30% of the time. He states he is walking 3 miles 4 times per week.  Thomas [name] is up 35 lbs since his last visit on 04/16/2018. 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 He had labs elsewhere on 12/25/2018.  Today's visit was #13 Starting weight: 245 lbs Starting date: 10/23/2017 Today's weight: 257 lbs  Today's date: 01/10/2019 Total lbs lost to date: 0 Total lbs lost since last in-office visit: 0  Type 2 Diabetes Mellitus (Worsening) Thomas [name] has a diagnosis of type II diabetes mellitus and is on no medications. His most recent A1c was 6.0 on 12/25/2018; previous A1c 5.7 on 02/12/2018.  Gastroesophageal Reflux Disease (GERD) Thomas [name] has a diagnosis of GERD and is taking Levbid and Prevacid. He denies abdominal cramping.  Vitamin D deficiency Thomas [name] has a diagnosis of Vitamin D deficiency and is taking Vitamin D 1,000 mg daily with Zinc, Vitamin C, and probiotic.  ASSESSMENT AND PLAN:  Type 2 diabetes mellitus without complication, without long-term current use of insulin (HCC)  Gastroesophageal reflux disease, unspecified whether esophagitis present  Vitamin D deficiency  Class 2 severe obesity with serious comorbidity and body mass index (BMI) of 37.0 to 37.9 in adult, unspecified obesity type ([place])  PLAN:  Diabetes II (Worsening) Thomas [name] has been given diabetes education by myself today. Good blood sugar control is important to decrease the likelihood of diabetic complications such as nephropathy, neuropathy, limb loss, blindness, coronary artery disease, and death. Intensive lifestyle modification including diet, exercise and weight loss were discussed as the first line treatment for  diabetes. Jewelz will decrease carbohydrates and increase protein. He will follow-up as directed to monitor his progress.  Gastroesophageal Reflux Disease (GERD) Thomas [name] was instructed to continue his medications as prescribed and follow-up as directed.  Vitamin D Deficiency Thomas [name] was informed that low Vitamin D levels contributes to fatigue and are associated with obesity, breast, and colon cancer. He agrees to continue Vit D OTC and will follow-up for routine testing of Vitamin D, at least 2-3 times per year. He was informed of the risk of over-replacement of Vitamin D and agrees to not increase his dose unless he discusses this with [name] first. Thomas [name] agrees to follow-up with our clinic in 2-4 weeks.  I spent > than 50% of the 20 minute visit on counseling as documented in the note.  TIME SPENT: 22 minutes  Obesity Thomas [name] is currently in the action stage of change. As such, his goal is to continue with weight loss efforts. He has agreed to follow the Category 3 plan with additional breakfast and lunch options. Thomas [name] will work on meal planning. We reviewed labs including CMP, lipids, CBC, A1c, and thyroid panel. He was given 2 ounce meat substitutes. Thomas [name] will have IC at his next visit and will arrive 15 minutes early for that appointment. Thomas [name] has been instructed to continue walking 3 miles 4 times per week for weight loss and overall health benefits. We discussed the following Behavioral Modification Strategies today: increasing lean protein intake, decreasing simple carbohydrates, increasing vegetables, increase H20 intake, decrease eating out, no skipping meals, work on meal planning and easy cooking plans, and keeping healthy foods in the home.  Thomas [name] has agreed  to follow-up with our clinic in 2-4 weeks. He was informed of the importance of frequent follow-up visits to maximize his success with intensive lifestyle modifications for his multiple health  conditions.  ALLERGIES: Allergies  Allergen Reactions  . No Known Allergies     MEDICATIONS: Current Outpatient Medications on File Prior to Visit  Medication Sig Dispense Refill  . albuterol (PROAIR HFA) 108 (90 Base) MCG/ACT inhaler INHALE 2 PUFFS EVERY 6 HOURS AS NEEDED FOR WHEEZING OR SHORTNESS OF BREATH. 3 Inhaler 3  . albuterol (PROVENTIL) (2.5 MG/3ML) 0.083% nebulizer solution Take 3 mLs (2.5 mg total) by nebulization every 4 (four) hours as needed for wheezing or shortness of breath. 75 mL 5  . budesonide-formoterol (SYMBICORT) 160-4.5 MCG/ACT inhaler TAKE 2 PUFFS BY MOUTH TWICE A DAY 30.6 Inhaler 0  . fluticasone (FLONASE) 50 MCG/ACT nasal spray SPRAY 2 SPRAYS INTO EACH NOSTRIL EVERY DAY 48 mL 0  . hyoscyamine (LEVBID) 0.375 MG 12 hr tablet Take 0.375 mg by mouth daily as needed for cramping.   11  . lansoprazole (PREVACID) 15 MG capsule Take 15 mg by mouth daily.     . montelukast (SINGULAIR) 10 MG tablet Take 1 tablet (10 mg total) by mouth at bedtime. 90 tablet 0  . naproxen (NAPROSYN) 500 MG tablet TAKE 1 TABLET BY MOUTH TIMES DAILY WITH A MEAL. 60 tablet 1   No current facility-administered medications on file prior to visit.    PAST MEDICAL HISTORY: Past Medical History:  Diagnosis Date  . Allergy   . Asthma   . Back pain   . Diabetes ([place])   . Dyspnea   . GERD (gastroesophageal reflux disease)   . History of hiatal hernia   . Pneumonia    5 -7 yrs ago  . Swallowing difficulty     PAST SURGICAL HISTORY: Past Surgical History:  Procedure Laterality Date  . ANTERIOR CERVICAL DECOMP/DISCECTOMY FUSION N/A 07/14/2016   Procedure: Cervical five-six5- Cervical six-seven Anterior cervical decompression/discectomy/fusion;  Surgeon: Erline Levine, MD;  Location: [facility];  Service: Neurosurgery;  Laterality: N/A;  . COLON SURGERY    . COLONOSCOPY  12/22/2014   per Dr. Earlean Shawl, clear, repeat in 5 yrs (hx of adenomas)   . DG THUMB LEFT HAND    . DG THUMB RIGHT HAND ([place]  HX)    . FUNCTIONAL ENDOSCOPIC SINUS SURGERY    . PARTIAL COLECTOMY     sigmoid, Dr. Earlean Shawl    SOCIAL HISTORY: Social History   Tobacco Use  . Smoking status: Former Smoker    Packs/day: 1.00    Years: 10.00    Pack years: 10.00    Types: Cigarettes    Quit date: 01/31/1986    Years since quitting: 32.9  . Smokeless tobacco: Never Used  Substance Use Topics  . Alcohol use: Yes    Comment: rarely  . Drug use: No    FAMILY HISTORY: Family History  Problem Relation Age of Onset  . Cancer Mother   . Cancer Father   . Hyperlipidemia Father   . Diabetes Unknown        maternal family   ROS: Review of Systems  Gastrointestinal:       Negative for abdominal cramping.   PHYSICAL EXAM: Blood pressure 136/80, pulse 95, temperature 98.5 F (36.9 C), temperature source Oral, height 5\' 10"  (1.778 m), weight 257 lb (116.6 kg), SpO2 97 %. Body mass index is 36.88 kg/m. Physical Exam Vitals reviewed.  Constitutional:  Appearance: Normal appearance. He is obese.  Cardiovascular:     Rate and Rhythm: Normal rate.     Pulses: Normal pulses.  Pulmonary:     Effort: Pulmonary effort is normal.     Breath sounds: Normal breath sounds.  Musculoskeletal:        General: Normal range of motion.  Skin:    General: Skin is warm and dry.  Neurological:     Mental Status: He is alert and oriented to person, place, and time.  Psychiatric:        Behavior: Behavior normal.   RECENT LABS AND TESTS: BMET    Component Value Date/Time   NA 139 12/25/2018 1444   NA 142 02/12/2018 1328   K 4.4 12/25/2018 1444   CL 102 12/25/2018 1444   CO2 30 12/25/2018 1444   GLUCOSE 94 12/25/2018 1444   BUN 13 12/25/2018 1444   BUN 15 02/12/2018 1328   CREATININE 1.02 12/25/2018 1444   CREATININE 1.05 04/24/2015 1428   CALCIUM 9.9 12/25/2018 1444   GFRNONAA 82 02/12/2018 1328   GFRNONAA 79 04/24/2015 1428   GFRAA 95 02/12/2018 1328   GFRAA >89 04/24/2015 1428   Lab Results  Component  Value Date   HGBA1C 6.0 12/25/2018   HGBA1C 5.7 (H) 02/12/2018   HGBA1C 6.5 (H) 10/23/2017   HGBA1C 8.6 (H) 07/07/2017   Lab Results  Component Value Date   INSULIN 10.9 02/12/2018   INSULIN 14.1 10/23/2017   CBC    Component Value Date/Time   WBC 6.6 12/25/2018 1444   RBC 4.78 12/25/2018 1444   HGB 15.2 12/25/2018 1444   HGB 16.0 10/23/2017 0953   HCT 43.9 12/25/2018 1444   HCT 42.6 10/23/2017 0953   PLT 275.0 12/25/2018 1444   MCV 91.7 12/25/2018 1444   MCV 88 10/23/2017 0953   MCH 33.0 10/23/2017 0953   MCH 30.6 07/11/2016 1211   MCHC 34.6 12/25/2018 1444   RDW 13.4 12/25/2018 1444   RDW 13.4 10/23/2017 0953   LYMPHSABS 2.1 12/25/2018 1444   LYMPHSABS 2.2 10/23/2017 0953   MONOABS 0.5 12/25/2018 1444   EOSABS 0.1 12/25/2018 1444   EOSABS 0.5 (H) 10/23/2017 0953   BASOSABS 0.1 12/25/2018 1444   BASOSABS 0.0 10/23/2017 0953   Iron/TIBC/Ferritin/ %Sat No results found for: IRON, TIBC, FERRITIN, IRONPCTSAT Lipid Panel     Component Value Date/Time   CHOL 143 10/23/2017 0953   TRIG 64 10/23/2017 0953   HDL 36 (L) 10/23/2017 0953   CHOLHDL 5 07/31/2017 0947   VLDL 20.4 07/31/2017 0947   LDLCALC 94 10/23/2017 0953   Hepatic Function Panel     Component Value Date/Time   PROT 7.2 12/25/2018 1444   PROT 6.9 02/12/2018 1328   ALBUMIN 4.3 12/25/2018 1444   ALBUMIN 4.3 02/12/2018 1328   AST 24 12/25/2018 1444   ALT 34 12/25/2018 1444   ALKPHOS 78 12/25/2018 1444   BILITOT 0.7 12/25/2018 1444   BILITOT 0.6 02/12/2018 1328   BILIDIR 0.1 12/25/2018 1444      Component Value Date/Time   TSH 0.87 12/25/2018 1444   TSH 0.695 10/23/2017 0953   TSH 1.04 07/31/2017 0947    OBESITY BEHAVIORAL INTERVENTION VISIT DOCUMENTATION FOR INSURANCE (~15 minutes)  I, Michaelene Song, am acting as Location manager for [employer], DO  I have reviewed the above documentation for accuracy and completeness, and I agree with the above. Jearld Lesch, DO

## 2019-01-17 ENCOUNTER — Other Ambulatory Visit: Payer: Self-pay | Admitting: Family Medicine

## 2019-01-17 DIAGNOSIS — B9689 Other specified bacterial agents as the cause of diseases classified elsewhere: Secondary | ICD-10-CM

## 2019-01-17 DIAGNOSIS — J019 Acute sinusitis, unspecified: Secondary | ICD-10-CM

## 2019-02-05 ENCOUNTER — Encounter (INDEPENDENT_AMBULATORY_CARE_PROVIDER_SITE_OTHER): Payer: Self-pay | Admitting: Bariatrics

## 2019-02-05 ENCOUNTER — Ambulatory Visit (INDEPENDENT_AMBULATORY_CARE_PROVIDER_SITE_OTHER): Payer: BC Managed Care – PPO | Admitting: Bariatrics

## 2019-02-05 ENCOUNTER — Other Ambulatory Visit: Payer: Self-pay

## 2019-02-05 VITALS — BP 118/60 | HR 83 | Temp 98.6°F | Ht 70.0 in | Wt 253.0 lb

## 2019-02-05 DIAGNOSIS — R0602 Shortness of breath: Secondary | ICD-10-CM | POA: Diagnosis not present

## 2019-02-05 DIAGNOSIS — E119 Type 2 diabetes mellitus without complications: Secondary | ICD-10-CM

## 2019-02-05 DIAGNOSIS — R5383 Other fatigue: Secondary | ICD-10-CM

## 2019-02-05 DIAGNOSIS — Z6836 Body mass index (BMI) 36.0-36.9, adult: Secondary | ICD-10-CM

## 2019-02-06 NOTE — Progress Notes (Signed)
Chief Complaint: OBESITY Thomas [name] is here to discuss his progress with his obesity treatment plan along with follow-up of his obesity related diagnoses. Thomas [name] is on the Category 3 Plan and states he is following his eating plan approximately 60% of the time. Thomas [name] states he is exercising 0 minutes 0 times per week.  Today's visit was #: 14 Starting weight: 245 lbs Starting date: 10/23/2017 Today's weight: 253 lbs Today's date: 02/05/2019 Total lbs lost to date: 0 Total lbs lost since last in-office visit: 4  Interim History: Thomas [name]'s last visit was on 01/10/2019 and prior to that his visit was 04/16/2018. He is down 4 lbs. He reports getting more water and protein.  Subjective:   Other fatigue and Shortness of breath. Thomas [name] has fatigue and shortness of breath with certain activities, increased with weight gain. He had been out of the program for about 9 months, and had gained back weight.  IC today [id number]; previously [id number].  Type 2 diabetes mellitus without complication, without long-term current use of insulin (Thomas [name]). Thomas [name] is on no medications. He states fasting blood sugars range between 91 and 129 with 2-hour postprandials 140. Last A1c 6.0 on 12/25/2018; previously 5.7 on 02/12/2018.  Assessment/Plan:   Other fatigue and Shortness of breath. IC was done to determine current RMR this was  discussed with the patient and planadjusted accordingly.  Type 2 diabetes mellitus without complication, without long-term current use of insulin (Thomas [name]). We reviewed labs. Thomas [name] will decrease carbohydrates, increase healthy fats and protein.  Class 2 severe obesity with serious comorbidity and body mass index (BMI) of 36.0 to 36.9 in adult, unspecified obesity type Thomas [name])  Thomas [name] is currently in the action stage of change. As such, his goal is to continue with weight loss efforts. He has agreed to Category 4 Plan. He will work on meal planning and will continue to increase his water  and protein intake.  We discussed the following exercise goals today: For substantial health benefits, adults should do at least 150 minutes (2 hours and 30 minutes) a week of moderate-intensity, or 75 minutes (1 hour and 15 minutes) a week of vigorous-intensity aerobic physical activity, or an equivalent combination of moderate- and vigorous-intensity aerobic activity. Aerobic activity should be performed in episodes of at least 10 minutes, and preferably, it should be spread throughout the week. Adults should also include muscle-strengthening activities that involve all major muscle groups on 2 or more days a week. He will increase walking and begin weights.  We discussed the following behavioral modification strategies today: increasing lean protein intake, decreasing simple carbohydrates, increasing vegetables, increasing water intake, decreasing eating out, no skipping meals, meal planning and cooking strategies, keeping healthy foods in the home, avoiding temptations and planning for success.  Thomas [name] has agreed to follow-up with our clinic in 2-3 weeks. He was informed of the importance of frequent follow-up visits to maximize his success with intensive lifestyle modifications for his multiple health conditions.  Objective:   Blood pressure 118/60, pulse 83, temperature 98.6 F (37 C), height 5\' 10"  (1.778 m), weight 253 lb (114.8 kg), SpO2 97 %. Body mass index is 36.3 kg/m.  General: Cooperative, alert, well developed, in no acute distress. HEENT: Conjunctivae and lids unremarkable. Neck: No thyromegaly.  Cardiovascular: Regular rhythm.  Lungs: Normal work of breathing. Extremities: No edema.  Neurologic: No focal deficits.   Lab Results  Component Value Date   CREATININE 1.02 12/25/2018   BUN 13 12/25/2018  NA 139 12/25/2018   K 4.4 12/25/2018   CL 102 12/25/2018   CO2 30 12/25/2018   Lab Results  Component Value Date   ALT 34 12/25/2018   AST 24 12/25/2018    ALKPHOS 78 12/25/2018   BILITOT 0.7 12/25/2018   Lab Results  Component Value Date   HGBA1C 6.0 12/25/2018   HGBA1C 5.7 (H) 02/12/2018   HGBA1C 6.5 (H) 10/23/2017   HGBA1C 8.6 (H) 07/07/2017   Lab Results  Component Value Date   INSULIN 10.9 02/12/2018   INSULIN 14.1 10/23/2017   Lab Results  Component Value Date   TSH 0.87 12/25/2018   Lab Results  Component Value Date   CHOL 143 10/23/2017   HDL 36 (L) 10/23/2017   LDLCALC 94 10/23/2017   TRIG 64 10/23/2017   CHOLHDL 5 07/31/2017   Lab Results  Component Value Date   WBC 6.6 12/25/2018   HGB 15.2 12/25/2018   HCT 43.9 12/25/2018   MCV 91.7 12/25/2018   PLT 275.0 12/25/2018   No results found for: IRON, TIBC, FERRITIN  Attestation Statements:   Reviewed by clinician on day of visit: allergies, medications, problem list, medical history, surgical history, family history, social history and previous encounter notes.  This visit occurred during the SARS-CoV-2 public health emergency. Safety protocols were in place, including screening questions prior to the visit, additional usage of staff PPE, and extensive cleaning of exam room while observing appropriate contact time as indicated for disinfecting solutions. (CPT [id number])  I, Michaelene Song, am acting as transcriptionist for [employer], DO  I have reviewed the above documentation for accuracy and completeness, and I agree with the above. Jearld Lesch, DO

## 2019-02-08 ENCOUNTER — Encounter: Payer: Self-pay | Admitting: Family Medicine

## 2019-02-26 ENCOUNTER — Ambulatory Visit (INDEPENDENT_AMBULATORY_CARE_PROVIDER_SITE_OTHER): Payer: BC Managed Care – PPO | Admitting: Bariatrics

## 2019-02-26 ENCOUNTER — Encounter (INDEPENDENT_AMBULATORY_CARE_PROVIDER_SITE_OTHER): Payer: Self-pay | Admitting: Bariatrics

## 2019-02-26 ENCOUNTER — Other Ambulatory Visit: Payer: Self-pay

## 2019-02-26 VITALS — BP 146/85 | HR 91 | Temp 98.8°F | Ht 70.0 in | Wt 251.0 lb

## 2019-02-26 DIAGNOSIS — Z6836 Body mass index (BMI) 36.0-36.9, adult: Secondary | ICD-10-CM | POA: Diagnosis not present

## 2019-02-26 DIAGNOSIS — E119 Type 2 diabetes mellitus without complications: Secondary | ICD-10-CM | POA: Diagnosis not present

## 2019-02-26 DIAGNOSIS — K219 Gastro-esophageal reflux disease without esophagitis: Secondary | ICD-10-CM | POA: Diagnosis not present

## 2019-02-26 NOTE — Progress Notes (Signed)
Chief Complaint:   OBESITY Thomas [name] is here to discuss his progress with his obesity treatment plan along with follow-up of his obesity related diagnoses. Thomas [name] is on the Category 4 Plan and states he is following his eating plan approximately 90% of the time. Thomas [name] states he is walking 3 miles 4 times per week.  Today's visit was #: 15 Starting weight: 245 lbs Starting date: 10/23/2017 Today's weight: 251 lbs Today's date: 02/26/2019 Total lbs lost to date: 0 Total lbs lost since last in-office visit: 2  Interim History: Thomas [name] is down 2 lbs. He states that he  is slowly getting back into the plan.  Subjective:   Type 2 diabetes mellitus without complication, without long-term current use of insulin (Thomas [name]). Last A1c and insulin levels listed below.  Lab Results  Component Value Date   HGBA1C 6.0 12/25/2018   HGBA1C 5.7 (H) 02/12/2018   HGBA1C 6.5 (H) 10/23/2017   Lab Results  Component Value Date   LDLCALC 94 10/23/2017   CREATININE 1.02 12/25/2018   Lab Results  Component Value Date   INSULIN 10.9 02/12/2018   INSULIN 14.1 10/23/2017   Gastroesophageal reflux disease, unspecified whether esophagitis present. Thomas [name] is taking Prevacid.  Assessment/Plan:   Type 2 diabetes mellitus without complication, without long-term current use of insulin ([place]). Good blood sugar control is important to decrease the likelihood of diabetic complications such as nephropathy, neuropathy, limb loss, blindness, coronary artery disease, and death. Intensive lifestyle modification including diet, exercise and weight loss are the first line of treatment for diabetes. Thomas [name] will decrease carbohydrates, increase protein and healthy fats, and will continue exercise.  Gastroesophageal reflux disease, unspecified whether esophagitis present. Intensive lifestyle modifications are the first line treatment for this issue. We discussed several lifestyle modifications today and he will  continue to work on diet, exercise and weight loss efforts. Orders and follow up as documented in patient record. Thomas [name] will continue his medications and will continue probiotic.  Counseling . If a person has gastroesophageal reflux disease (GERD), food and stomach acid move back up into the esophagus and cause symptoms or problems such as damage to the esophagus. . Anti-reflux measures include: raising the head of the bed, avoiding tight clothing or belts, avoiding eating late at night, not lying down shortly after mealtime, and achieving weight loss. . Avoid ASA, NSAID's, caffeine, alcohol, and tobacco.  . OTC Pepcid and/or Tums are often very helpful for as needed use.  Marland Kitchen However, for persisting chronic or daily symptoms, stronger medications like Omeprazole may be needed. . You may need to avoid foods and drinks such as: ? Coffee and tea (with or without caffeine). ? Drinks that contain alcohol. ? Energy drinks and sports drinks. ? Bubbly (carbonated) drinks or sodas. ? Chocolate and cocoa. ? Peppermint and mint flavorings. ? Garlic and onions. ? Horseradish. ? Spicy and acidic foods. These include peppers, chili powder, curry powder, vinegar, hot sauces, and BBQ sauce. ? Citrus fruit juices and citrus fruits, such as oranges, lemons, and limes. ? Tomato-based foods. These include red sauce, chili, salsa, and pizza with red sauce. ? Fried and fatty foods. These include donuts, french fries, potato chips, and high-fat dressings. ? High-fat meats. These include hot dogs, rib eye steak, sausage, ham, and bacon.  Class 2 severe obesity with serious comorbidity and body mass index (BMI) of 36.0 to 36.9 in adult, unspecified obesity type ([place]).  Thomas [name] is currently in the action stage of change. As  such, his goal is to continue with weight loss efforts. He has agreed to the Category 4 Plan.   He will work on meal planning, intentional eating, increasing protein, decreasing carbohydrates,  increasing healthy fats and raw vegetables.  Exercise goals: Thomas [name] will continue walking as above. He has not gotten back into weights.  Behavioral modification strategies: increasing lean protein intake, decreasing simple carbohydrates, increasing vegetables, increasing water intake, decreasing eating out, no skipping meals, meal planning and cooking strategies, keeping healthy foods in the home and planning for success.  Thomas [name] has agreed to follow-up with our clinic in 2 weeks. He was informed of the importance of frequent follow-up visits to maximize his success with intensive lifestyle modifications for his multiple health conditions.   Objective:   Blood pressure (!) 146/85, pulse 91, temperature 98.8 F (37.1 C), height 5\' 10"  (1.778 m), weight 251 lb (113.9 kg), SpO2 98 %. Body mass index is 36.01 kg/m.  General: Cooperative, alert, well developed, in no acute distress. HEENT: Conjunctivae and lids unremarkable. Cardiovascular: Regular rhythm.  Lungs: Normal work of breathing. Neurologic: No focal deficits.   Lab Results  Component Value Date   CREATININE 1.02 12/25/2018   BUN 13 12/25/2018   NA 139 12/25/2018   K 4.4 12/25/2018   CL 102 12/25/2018   CO2 30 12/25/2018   Lab Results  Component Value Date   ALT 34 12/25/2018   AST 24 12/25/2018   ALKPHOS 78 12/25/2018   BILITOT 0.7 12/25/2018   Lab Results  Component Value Date   HGBA1C 6.0 12/25/2018   HGBA1C 5.7 (H) 02/12/2018   HGBA1C 6.5 (H) 10/23/2017   HGBA1C 8.6 (H) 07/07/2017   Lab Results  Component Value Date   INSULIN 10.9 02/12/2018   INSULIN 14.1 10/23/2017   Lab Results  Component Value Date   TSH 0.87 12/25/2018   Lab Results  Component Value Date   CHOL 143 10/23/2017   HDL 36 (L) 10/23/2017   LDLCALC 94 10/23/2017   TRIG 64 10/23/2017   CHOLHDL 5 07/31/2017   Lab Results  Component Value Date   WBC 6.6 12/25/2018   HGB 15.2 12/25/2018   HCT 43.9 12/25/2018   MCV 91.7  12/25/2018   PLT 275.0 12/25/2018   No results found for: IRON, TIBC, FERRITIN  Attestation Statements:   Reviewed by clinician on day of visit: allergies, medications, problem list, medical history, surgical history, family history, social history, and previous encounter notes.  Time spent on visit including pre-visit chart review and post-visit care was 20 minutes.   Migdalia Dk, am acting as Location manager for [employer], DO  I have reviewed the above documentation for accuracy and completeness, and I agree with the above. Jearld Lesch, DO

## 2019-02-27 ENCOUNTER — Encounter (INDEPENDENT_AMBULATORY_CARE_PROVIDER_SITE_OTHER): Payer: Self-pay | Admitting: Bariatrics

## 2019-03-12 ENCOUNTER — Ambulatory Visit: Payer: BC Managed Care – PPO | Attending: Internal Medicine

## 2019-03-12 ENCOUNTER — Other Ambulatory Visit: Payer: Self-pay

## 2019-03-12 DIAGNOSIS — Z23 Encounter for immunization: Secondary | ICD-10-CM

## 2019-03-12 NOTE — Progress Notes (Signed)
   Covid-19 Vaccination Clinic  Name:  Thomas [name]    MRN: [id number] DOB: 1960-06-03  03/12/2019  Thomas [name] was observed post Covid-19 immunization for 15 minutes without incidence. He was provided with Vaccine Information Sheet and instruction to access the V-Safe system.   Thomas [name] was instructed to call 911 with any severe reactions post vaccine: Marland Kitchen Difficulty breathing  . Swelling of your face and throat  . A fast heartbeat  . A bad rash all over your body  . Dizziness and weakness    Immunizations Administered    Name Date Dose VIS Date Route   Pfizer COVID-19 Vaccine 03/12/2019  1:24 PM 0.3 mL 01/11/2019 Intramuscular   Manufacturer: [place]   Lot: [id number]   [place]: [id number]

## 2019-03-14 ENCOUNTER — Ambulatory Visit: Payer: BC Managed Care – PPO

## 2019-03-17 ENCOUNTER — Other Ambulatory Visit: Payer: Self-pay | Admitting: Pulmonary Disease

## 2019-03-19 ENCOUNTER — Other Ambulatory Visit: Payer: Self-pay

## 2019-03-19 ENCOUNTER — Encounter (INDEPENDENT_AMBULATORY_CARE_PROVIDER_SITE_OTHER): Payer: Self-pay | Admitting: Bariatrics

## 2019-03-19 ENCOUNTER — Ambulatory Visit (INDEPENDENT_AMBULATORY_CARE_PROVIDER_SITE_OTHER): Payer: BC Managed Care – PPO | Admitting: Bariatrics

## 2019-03-19 VITALS — BP 138/76 | HR 80 | Temp 98.5°F | Ht 70.0 in | Wt 258.0 lb

## 2019-03-19 DIAGNOSIS — E119 Type 2 diabetes mellitus without complications: Secondary | ICD-10-CM | POA: Diagnosis not present

## 2019-03-19 DIAGNOSIS — Z6837 Body mass index (BMI) 37.0-37.9, adult: Secondary | ICD-10-CM | POA: Diagnosis not present

## 2019-03-19 DIAGNOSIS — R609 Edema, unspecified: Secondary | ICD-10-CM

## 2019-03-19 NOTE — Progress Notes (Signed)
Chief Complaint:   OBESITY Thomas [name] is here to discuss his progress with his obesity treatment plan along with follow-up of his obesity related diagnoses. Thomas [name] is on the Category 4 Plan and states he is following his eating plan approximately 90% of the time. Thomas [name] states he is walking 3-4 miles 3 times per week.  Today's visit was #: 104 Starting weight: 245 lbs Starting date: 10/23/2017 Today's weight: 258 lbs Today's date: 03/19/2019 Total lbs lost to date: 0 Total lbs lost since last in-office visit: 0  Interim History: Thomas [name] is up 7 lbs (mainly water) per the bioimpedance scale. He denies any increase in sodium or eating out more frequently.   Subjective:   Fluid retention. Thomas [name] is up approximately 7 lbs and has trace edema in his lower legs. He denies SOB and has been exercising without problems.   Type 2 diabetes mellitus without complication, without long-term current use of insulin (Thomas [name]). This is well controlled.  Lab Results  Component Value Date   HGBA1C 6.0 12/25/2018   HGBA1C 5.7 (H) 02/12/2018   HGBA1C 6.5 (H) 10/23/2017   Lab Results  Component Value Date   LDLCALC 94 10/23/2017   CREATININE 1.02 12/25/2018   Lab Results  Component Value Date   INSULIN 10.9 02/12/2018   INSULIN 14.1 10/23/2017   Assessment/Plan:   Fluid retention. Thomas [name] will increase water slightly, will cut out added salt, and will decrease carbohydrates in his diet ( begin low carbohydrate diet ).  He will be sent back to his PCP for evaluation if fluid remains or worsens.  Type 2 diabetes mellitus without complication, without long-term current use of insulin (Thomas [name]). Good blood sugar control is important to decrease the likelihood of diabetic complications such as nephropathy, neuropathy, limb loss, blindness, coronary artery disease, and death. Intensive lifestyle modification including diet, exercise and weight loss are the first line of treatment for diabetes.  Thomas [name] will decrease carbohydrates and increase healthy fats and protein.  Class 2 severe obesity with serious comorbidity and body mass index (BMI) of 37.0 to 37.9 in adult, unspecified obesity type (Thomas [name]).  Thomas [name] is currently in the action stage of change. As such, his goal is to continue with weight loss efforts. He has agreed to the Category 4 Plan.   He will work on meal planning and mindless eating.  Exercise goals: Thomas [name] has been walking, but has decreased due to the weather.  Behavioral modification strategies: increasing lean protein intake, decreasing simple carbohydrates, increasing vegetables, increasing water intake, decreasing eating out, no skipping meals, meal planning and cooking strategies and keeping healthy foods in the home.  Thomas [name] has agreed to follow-up with our clinic in 2 weeks. He was informed of the importance of frequent follow-up visits to maximize his success with intensive lifestyle modifications for his multiple health conditions.   Objective:   Blood pressure 138/76, pulse 80, temperature 98.5 F (36.9 C), height 5\' 10"  (1.778 m), weight 258 lb (117 kg), SpO2 96 %. Body mass index is 37.02 kg/m.  General: Cooperative, alert, well developed, in no acute distress. HEENT: Conjunctivae and lids unremarkable. Cardiovascular: Regular rhythm. Trace edema in lower extremities. Lungs: Normal work of breathing. No shortness of breath.  Neurologic: No focal deficits.   Lab Results  Component Value Date   CREATININE 1.02 12/25/2018   BUN 13 12/25/2018   NA 139 12/25/2018   K 4.4 12/25/2018   CL 102 12/25/2018   CO2 30 12/25/2018   Lab  Results  Component Value Date   ALT 34 12/25/2018   AST 24 12/25/2018   ALKPHOS 78 12/25/2018   BILITOT 0.7 12/25/2018   Lab Results  Component Value Date   HGBA1C 6.0 12/25/2018   HGBA1C 5.7 (H) 02/12/2018   HGBA1C 6.5 (H) 10/23/2017   HGBA1C 8.6 (H) 07/07/2017   Lab Results  Component Value Date   INSULIN  10.9 02/12/2018   INSULIN 14.1 10/23/2017   Lab Results  Component Value Date   TSH 0.87 12/25/2018   Lab Results  Component Value Date   CHOL 143 10/23/2017   HDL 36 (L) 10/23/2017   LDLCALC 94 10/23/2017   TRIG 64 10/23/2017   CHOLHDL 5 07/31/2017   Lab Results  Component Value Date   WBC 6.6 12/25/2018   HGB 15.2 12/25/2018   HCT 43.9 12/25/2018   MCV 91.7 12/25/2018   PLT 275.0 12/25/2018   No results found for: IRON, TIBC, FERRITIN  Attestation Statements:   Reviewed by clinician on day of visit: allergies, medications, problem list, medical history, surgical history, family history, social history, and previous encounter notes.  Time spent on visit including pre-visit chart review and post-visit care was 20 minutes.   Migdalia Dk, am acting as Location manager for [employer], DO   I have reviewed the above documentation for accuracy and completeness, and I agree with the above. Jearld Lesch, DO
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
## 2019-04-02 ENCOUNTER — Ambulatory Visit (INDEPENDENT_AMBULATORY_CARE_PROVIDER_SITE_OTHER): Payer: BC Managed Care – PPO | Admitting: Bariatrics

## 2019-04-02 ENCOUNTER — Encounter (INDEPENDENT_AMBULATORY_CARE_PROVIDER_SITE_OTHER): Payer: Self-pay | Admitting: Bariatrics

## 2019-04-02 ENCOUNTER — Other Ambulatory Visit: Payer: Self-pay

## 2019-04-02 VITALS — BP 132/81 | HR 96 | Temp 98.4°F | Ht 70.0 in | Wt 250.0 lb

## 2019-04-02 DIAGNOSIS — E119 Type 2 diabetes mellitus without complications: Secondary | ICD-10-CM | POA: Diagnosis not present

## 2019-04-02 DIAGNOSIS — Z6835 Body mass index (BMI) 35.0-35.9, adult: Secondary | ICD-10-CM | POA: Diagnosis not present

## 2019-04-02 DIAGNOSIS — K219 Gastro-esophageal reflux disease without esophagitis: Secondary | ICD-10-CM

## 2019-04-02 MED ORDER — LANSOPRAZOLE 15 MG PO CPDR: 15.0000 mg | DELAYED_RELEASE_CAPSULE | Freq: Every day | ORAL | 0 refills | Status: DC

## 2019-04-03 ENCOUNTER — Encounter (INDEPENDENT_AMBULATORY_CARE_PROVIDER_SITE_OTHER): Payer: Self-pay | Admitting: Bariatrics

## 2019-04-03 NOTE — Progress Notes (Signed)
Chief Complaint:   OBESITY Thomas [name] is here to discuss his progress with his obesity treatment plan along with follow-up of his obesity related diagnoses. Thomas [name] is following a lower carbohydrate, vegetable and lean protein rich diet plan and states he is following his eating plan approximately 99% of the time. Jerramie states he is walking 3 miles 3 times per week.  Today's visit was #: 27 Starting weight: 245 lbs Starting date: 10/23/2017 Today's weight: 250 lbs Today's date: 04/03/2019 Total lbs lost to date: 0 Total lbs lost since last in-office visit: 8  Interim History: Thomas [name] is down 8 lbs from his last visit. He was up approximately 7 lbs of water at his last visit. He reports he is drinking more water.  Subjective:   Gastroesophageal reflux disease, unspecified whether esophagitis present. Thomas [name] reports limited signs and symptoms.  Type 2 diabetes mellitus without complication, without long-term current use of insulin (Thomas [name]). No polyphagia. Fasting blood sugars are in the range of 80-90's.   Lab Results  Component Value Date   HGBA1C 6.0 12/25/2018   HGBA1C 5.7 (H) 02/12/2018   HGBA1C 6.5 (H) 10/23/2017   Lab Results  Component Value Date   LDLCALC 94 10/23/2017   CREATININE 1.02 12/25/2018   Lab Results  Component Value Date   INSULIN 10.9 02/12/2018   INSULIN 14.1 10/23/2017    Assessment/Plan:   Gastroesophageal reflux disease, unspecified whether esophagitis present. Intensive lifestyle modifications are the first line treatment for this issue. We discussed several lifestyle modifications today and he will continue to work on diet, exercise and weight loss efforts. Orders and follow up as documented in patient record. Yon was given a prescription for lansoprazole (PREVACID) 15 MG capsule 1 PO daily #30 with 2 refills.  Counseling . If a person has gastroesophageal reflux disease (GERD), food and stomach acid move back up into the esophagus  and cause symptoms or problems such as damage to the esophagus. . Anti-reflux measures include: raising the head of the bed, avoiding tight clothing or belts, avoiding eating late at night, not lying down shortly after mealtime, and achieving weight loss. . Avoid ASA, NSAID's, caffeine, alcohol, and tobacco.  . OTC Pepcid and/or Tums are often very helpful for as needed use.  Marland Kitchen However, for persisting chronic or daily symptoms, stronger medications like Omeprazole may be needed. . You may need to avoid foods and drinks such as: ? Coffee and tea (with or without caffeine). ? Drinks that contain alcohol. ? Energy drinks and sports drinks. ? Bubbly (carbonated) drinks or sodas. ? Chocolate and cocoa. ? Peppermint and mint flavorings. ? Garlic and onions. ? Horseradish. ? Spicy and acidic foods. These include peppers, chili powder, curry powder, vinegar, hot sauces, and BBQ sauce. ? Citrus fruit juices and citrus fruits, such as oranges, lemons, and limes. ? Tomato-based foods. These include red sauce, chili, salsa, and pizza with red sauce. ? Fried and fatty foods. These include donuts, french fries, potato chips, and high-fat dressings. ? High-fat meats. These include hot dogs, rib eye steak, sausage, ham, and bacon.    Type 2 diabetes mellitus without complication, without long-term current use of insulin (Thomas [name]). Good blood sugar control is important to decrease the likelihood of diabetic complications such as nephropathy, neuropathy, limb loss, blindness, coronary artery disease, and death. Intensive lifestyle modification including diet, exercise and weight loss are the first line of treatment for diabetes. Thomas [name] will decrease carbohydrates and increase healthy fats and protein.  Class 2 severe obesity with serious comorbidity and body mass index (BMI) of 35.0 to 35.9 in adult, unspecified obesity type (Thomas [name]).  Thomas [name] is currently in the action stage of change. As such, his goal is to  continue with weight loss efforts. He has agreed to following a lower carbohydrate, vegetable and lean protein rich diet plan.   He will work on meal planning, mindful eating, and decreasing his salt intake.  Exercise goals: All adults should avoid inactivity. Some physical activity is better than none, and adults who participate in any amount of physical activity gain some health benefits.  Behavioral modification strategies: increasing lean protein intake, decreasing simple carbohydrates, increasing vegetables, increasing water intake, decreasing eating out, no skipping meals, meal planning and cooking strategies and keeping healthy foods in the home.  Thomas [name] has agreed to follow-up with our clinic in 2 weeks. He was informed of the importance of frequent follow-up visits to maximize his success with intensive lifestyle modifications for his multiple health conditions.   Objective:   Blood pressure 132/81, pulse 96, temperature 98.4 F (36.9 C), height 5\' 10"  (1.778 m), weight 250 lb (113.4 kg), SpO2 96 %. Body mass index is 35.87 kg/m.  General: Cooperative, alert, well developed, in no acute distress. HEENT: Conjunctivae and lids unremarkable. Cardiovascular: Regular rhythm.  Lungs: Normal work of breathing. Neurologic: No focal deficits.   Lab Results  Component Value Date   CREATININE 1.02 12/25/2018   BUN 13 12/25/2018   NA 139 12/25/2018   K 4.4 12/25/2018   CL 102 12/25/2018   CO2 30 12/25/2018   Lab Results  Component Value Date   ALT 34 12/25/2018   AST 24 12/25/2018   ALKPHOS 78 12/25/2018   BILITOT 0.7 12/25/2018   Lab Results  Component Value Date   HGBA1C 6.0 12/25/2018   HGBA1C 5.7 (H) 02/12/2018   HGBA1C 6.5 (H) 10/23/2017   HGBA1C 8.6 (H) 07/07/2017   Lab Results  Component Value Date   INSULIN 10.9 02/12/2018   INSULIN 14.1 10/23/2017   Lab Results  Component Value Date   TSH 0.87 12/25/2018   Lab Results  Component Value Date   CHOL 143  10/23/2017   HDL 36 (L) 10/23/2017   LDLCALC 94 10/23/2017   TRIG 64 10/23/2017   CHOLHDL 5 07/31/2017   Lab Results  Component Value Date   WBC 6.6 12/25/2018   HGB 15.2 12/25/2018   HCT 43.9 12/25/2018   MCV 91.7 12/25/2018   PLT 275.0 12/25/2018   No results found for: IRON, TIBC, FERRITIN  Attestation Statements:   Reviewed by clinician on day of visit: allergies, medications, problem list, medical history, surgical history, family history, social history, and previous encounter notes.  Migdalia Dk, am acting as Location manager for [employer], DO   I have reviewed the above documentation for accuracy and completeness, and I agree with the above. Jearld Lesch, DO

## 2019-04-06 ENCOUNTER — Ambulatory Visit: Payer: BC Managed Care – PPO | Attending: Internal Medicine

## 2019-04-06 DIAGNOSIS — Z23 Encounter for immunization: Secondary | ICD-10-CM | POA: Insufficient documentation

## 2019-04-06 NOTE — Progress Notes (Signed)
   Covid-19 Vaccination Clinic  Name:  Thomas [name]    MRN: [id number] DOB: 1960-06-26  04/06/2019  [name] was observed post Covid-19 immunization for 15 minutes without incident. He was provided with Vaccine Information Sheet and instruction to access the V-Safe system.   [name] was instructed to call 911 with any severe reactions post vaccine: Marland Kitchen Difficulty breathing  . Swelling of face and throat  . A fast heartbeat  . A bad rash all over body  . Dizziness and weakness   Immunizations Administered    Name Date Dose VIS Date Route   Pfizer COVID-19 Vaccine 04/06/2019  1:28 PM 0.3 mL 01/11/2019 Intramuscular   Manufacturer: [place]   Lot: [id number]   [place]: [id number]

## 2019-04-15 ENCOUNTER — Other Ambulatory Visit: Payer: Self-pay | Admitting: Family Medicine

## 2019-04-15 DIAGNOSIS — B9689 Other specified bacterial agents as the cause of diseases classified elsewhere: Secondary | ICD-10-CM

## 2019-04-15 DIAGNOSIS — J019 Acute sinusitis, unspecified: Secondary | ICD-10-CM

## 2019-04-19 ENCOUNTER — Other Ambulatory Visit: Payer: Self-pay | Admitting: Pulmonary Disease

## 2019-04-23 ENCOUNTER — Other Ambulatory Visit: Payer: Self-pay

## 2019-04-23 ENCOUNTER — Ambulatory Visit (INDEPENDENT_AMBULATORY_CARE_PROVIDER_SITE_OTHER): Payer: BC Managed Care – PPO | Admitting: Bariatrics

## 2019-04-23 ENCOUNTER — Encounter (INDEPENDENT_AMBULATORY_CARE_PROVIDER_SITE_OTHER): Payer: Self-pay | Admitting: Bariatrics

## 2019-04-23 VITALS — BP 135/74 | HR 74 | Temp 98.6°F | Ht 70.0 in | Wt 241.0 lb

## 2019-04-23 DIAGNOSIS — E669 Obesity, unspecified: Secondary | ICD-10-CM

## 2019-04-23 DIAGNOSIS — E119 Type 2 diabetes mellitus without complications: Secondary | ICD-10-CM

## 2019-04-23 DIAGNOSIS — K219 Gastro-esophageal reflux disease without esophagitis: Secondary | ICD-10-CM

## 2019-04-23 DIAGNOSIS — Z6834 Body mass index (BMI) 34.0-34.9, adult: Secondary | ICD-10-CM | POA: Diagnosis not present

## 2019-04-23 NOTE — Progress Notes (Signed)
Chief Complaint:   Thomas [name] is here to discuss his progress with his Thomas treatment plan along with follow-up of his Thomas related diagnoses. Thomas [name] is following a lower carbohydrate, vegetable and lean protein rich diet plan and states he is following his eating plan approximately 98% of the time. Thomas [name] states he is walking 3-4 miles 3-4 times per week.  Today's visit was #: 18 Starting weight: 245 lbs Starting date: 10/23/2017 Today's weight: 241 lbs Today's date: 04/23/2019 Total lbs lost to date: 4 Total lbs lost since last in-office visit: 9  Interim History: Thomas [name] is down 9 lbs and doing well. He reports drinking more water.  Subjective:   Type 2 diabetes mellitus without complication, without long-term current use of insulin ([place]).  Diabetes is extremely well controlled. He is on no medications. Fasting blood sugars range between 90 and 120's.  Lab Results  Component Value Date   HGBA1C 6.0 12/25/2018   HGBA1C 5.7 (H) 02/12/2018   HGBA1C 6.5 (H) 10/23/2017   Lab Results  Component Value Date   LDLCALC 94 10/23/2017   CREATININE 1.02 12/25/2018   Lab Results  Component Value Date   INSULIN 10.9 02/12/2018   INSULIN 14.1 10/23/2017   Gastroesophageal reflux disease without esophagitis. Thomas [name] is taking Prevacid. GERD is controlled.  Assessment/Plan:   Type 2 diabetes mellitus without complication, without long-term current use of insulin ([name]). Good blood sugar control is important to decrease the likelihood of diabetic complications such as nephropathy, neuropathy, limb loss, blindness, coronary artery disease, and death. Intensive lifestyle modification including diet, exercise and weight loss are the first line of treatment for diabetes. Thomas [name] will decrease carbohydrates and will increase healthy fats and protein.  Gastroesophageal reflux disease without esophagitis. Intensive lifestyle modifications are the first line treatment for  this issue. We discussed several lifestyle modifications today and he will continue to work on diet, exercise and weight loss efforts. Orders and follow up as documented in patient record. Thomas [name] will continue his medication as directed.  Counseling . If a person has gastroesophageal reflux disease (GERD), food and stomach acid move back up into the esophagus and cause symptoms or problems such as damage to the esophagus. . Anti-reflux measures include: raising the head of the bed, avoiding tight clothing or belts, avoiding eating late at night, not lying down shortly after mealtime, and achieving weight loss. . Avoid ASA, NSAID's, caffeine, alcohol, and tobacco.  . OTC Pepcid and/or Tums are often very helpful for as needed use.  Marland Kitchen However, for persisting chronic or daily symptoms, stronger medications like Omeprazole may be needed. . You may need to avoid foods and drinks such as: ? Coffee and tea (with or without caffeine). ? Drinks that contain alcohol. ? Energy drinks and sports drinks. ? Bubbly (carbonated) drinks or sodas. ? Chocolate and cocoa. ? Peppermint and mint flavorings. ? Garlic and onions. ? Horseradish. ? Spicy and acidic foods. These include peppers, chili powder, curry powder, vinegar, hot sauces, and BBQ sauce. ? Citrus fruit juices and citrus fruits, such as oranges, lemons, and limes. ? Tomato-based foods. These include red sauce, chili, salsa, and pizza with red sauce. ? Fried and fatty foods. These include donuts, french fries, potato chips, and high-fat dressings. ? High-fat meats. These include hot dogs, rib eye steak, sausage, ham, and bacon.  Class 1 Thomas with serious comorbidity and body mass index (BMI) of 34.0 to 34.9 in adult, unspecified Thomas type.  Thomas [name] is currently  in the action stage of change. As such, his goal is to continue with weight loss efforts. He has agreed to following a lower carbohydrate, vegetable and lean protein rich diet plan.    He will work on meal planning, continue to increase his water intake, and will get to the gym when possible.  Exercise goals: Thomas [name] will continue to walk and will increase over time.  Behavioral modification strategies: increasing lean protein intake, decreasing simple carbohydrates, increasing vegetables, increasing water intake, decreasing eating out, no skipping meals, meal planning and cooking strategies and keeping healthy foods in the home.  Thomas [name] has agreed to follow-up with our clinic in 2 weeks. He was informed of the importance of frequent follow-up visits to maximize his success with intensive lifestyle modifications for his multiple health conditions.   Objective:   Blood pressure 135/74, pulse 74, temperature 98.6 F (37 C), height 5\' 10"  (1.778 m), weight 241 lb (109.3 kg), SpO2 97 %. Body mass index is 34.58 kg/m.  General: Cooperative, alert, well developed, in no acute distress. HEENT: Conjunctivae and lids unremarkable. Cardiovascular: Regular rhythm.  Lungs: Normal work of breathing. Neurologic: No focal deficits.   Lab Results  Component Value Date   CREATININE 1.02 12/25/2018   BUN 13 12/25/2018   NA 139 12/25/2018   K 4.4 12/25/2018   CL 102 12/25/2018   CO2 30 12/25/2018   Lab Results  Component Value Date   ALT 34 12/25/2018   AST 24 12/25/2018   ALKPHOS 78 12/25/2018   BILITOT 0.7 12/25/2018   Lab Results  Component Value Date   HGBA1C 6.0 12/25/2018   HGBA1C 5.7 (H) 02/12/2018   HGBA1C 6.5 (H) 10/23/2017   HGBA1C 8.6 (H) 07/07/2017   Lab Results  Component Value Date   INSULIN 10.9 02/12/2018   INSULIN 14.1 10/23/2017   Lab Results  Component Value Date   TSH 0.87 12/25/2018   Lab Results  Component Value Date   CHOL 143 10/23/2017   HDL 36 (L) 10/23/2017   LDLCALC 94 10/23/2017   TRIG 64 10/23/2017   CHOLHDL 5 07/31/2017   Lab Results  Component Value Date   WBC 6.6 12/25/2018   HGB 15.2 12/25/2018   HCT 43.9  12/25/2018   MCV 91.7 12/25/2018   PLT 275.0 12/25/2018   No results found for: IRON, TIBC, FERRITIN  Attestation Statements:   Reviewed by clinician on day of visit: allergies, medications, problem list, medical history, surgical history, family history, social history, and previous encounter notes.  Time spent on visit including pre-visit chart review and post-visit charting and care was 20 minutes.   Migdalia Dk, am acting as Location manager for [employer], DO   I have reviewed the above documentation for accuracy and completeness, and I agree with the above. Jearld Lesch, DO

## 2019-04-24 ENCOUNTER — Other Ambulatory Visit: Payer: Self-pay | Admitting: Family Medicine

## 2019-04-24 ENCOUNTER — Other Ambulatory Visit: Payer: Self-pay

## 2019-04-24 ENCOUNTER — Other Ambulatory Visit: Payer: Self-pay | Admitting: Pulmonary Disease

## 2019-04-24 DIAGNOSIS — B9689 Other specified bacterial agents as the cause of diseases classified elsewhere: Secondary | ICD-10-CM

## 2019-04-24 DIAGNOSIS — J019 Acute sinusitis, unspecified: Secondary | ICD-10-CM

## 2019-04-24 MED ORDER — BUDESONIDE-FORMOTEROL FUMARATE 160-4.5 MCG/ACT IN AERO: INHALATION_SPRAY | RESPIRATORY_TRACT | 0 refills | Status: DC

## 2019-04-24 MED ORDER — MONTELUKAST SODIUM 10 MG PO TABS: 10.0000 mg | ORAL_TABLET | Freq: Every day | ORAL | 0 refills | Status: DC

## 2019-04-25 NOTE — Telephone Encounter (Signed)
RX SENT IN ON 04/15/2019

## 2019-05-07 ENCOUNTER — Encounter (INDEPENDENT_AMBULATORY_CARE_PROVIDER_SITE_OTHER): Payer: Self-pay | Admitting: Bariatrics

## 2019-05-07 ENCOUNTER — Ambulatory Visit (INDEPENDENT_AMBULATORY_CARE_PROVIDER_SITE_OTHER): Payer: BC Managed Care – PPO | Admitting: Bariatrics

## 2019-05-07 ENCOUNTER — Other Ambulatory Visit: Payer: Self-pay

## 2019-05-07 VITALS — BP 146/69 | HR 81 | Temp 98.3°F | Ht 70.0 in | Wt 242.0 lb

## 2019-05-07 DIAGNOSIS — K219 Gastro-esophageal reflux disease without esophagitis: Secondary | ICD-10-CM

## 2019-05-07 DIAGNOSIS — E669 Obesity, unspecified: Secondary | ICD-10-CM | POA: Diagnosis not present

## 2019-05-07 DIAGNOSIS — E119 Type 2 diabetes mellitus without complications: Secondary | ICD-10-CM | POA: Diagnosis not present

## 2019-05-07 DIAGNOSIS — Z6834 Body mass index (BMI) 34.0-34.9, adult: Secondary | ICD-10-CM

## 2019-05-07 DIAGNOSIS — J45909 Unspecified asthma, uncomplicated: Secondary | ICD-10-CM

## 2019-05-07 MED ORDER — ALBUTEROL SULFATE HFA 108 (90 BASE) MCG/ACT IN AERS: INHALATION_SPRAY | RESPIRATORY_TRACT | 1 refills | Status: DC

## 2019-05-07 NOTE — Progress Notes (Signed)
Chief Complaint:   OBESITY Thomas [name] is here to discuss his progress with his obesity treatment plan along with follow-up of his obesity related diagnoses. Thomas [name] is following a lower carbohydrate, vegetable and lean protein rich diet plan and states he is following his eating plan approximately 90% of the time. Thomas [name] states he is walking 3 miles 3 times per week.  Today's visit was #: 52 Starting weight: 245 lbs Starting date: 10/23/2017 Today's weight: 242 lbs Today's date: 05/07/2019 Total lbs lost to date: 3 Total lbs lost since last in-office visit: 0  Interim History: Thomas [name] is up 1 lb. He is up 2.2 lbs of water. He reports drinking adequate water. He has been very active. He states he is not eating any extra salt.  Subjective:   Gastroesophageal reflux disease without esophagitis. Thomas [name] is taking Prevacid.  Type 2 diabetes mellitus without complication, without long-term current use of insulin (Thomas [name]). Thomas [name] is on no medications. Diabetes is well controlled.   Lab Results  Component Value Date   HGBA1C 6.0 12/25/2018   HGBA1C 5.7 (H) 02/12/2018   HGBA1C 6.5 (H) 10/23/2017   Lab Results  Component Value Date   LDLCALC 94 10/23/2017   CREATININE 1.02 12/25/2018   Lab Results  Component Value Date   INSULIN 10.9 02/12/2018   INSULIN 14.1 10/23/2017   Reactive airway disease without complication, unspecified asthma severity, unspecified whether persistent. Thomas [name] reports slight increase of shortness of breath with seasonal allergies.  Assessment/Plan:   Gastroesophageal reflux disease without esophagitis. Intensive lifestyle modifications are the first line treatment for this issue. We discussed several lifestyle modifications today and he will continue to work on diet, exercise and weight loss efforts. Orders and follow up as documented in patient record. Thomas [name] will continue medication as directed.  Counseling . If a person has gastroesophageal  reflux disease (GERD), food and stomach acid move back up into the esophagus and cause symptoms or problems such as damage to the esophagus. . Anti-reflux measures include: raising the head of the bed, avoiding tight clothing or belts, avoiding eating late at night, not lying down shortly after mealtime, and achieving weight loss. . Avoid ASA, NSAID's, caffeine, alcohol, and tobacco.  . OTC Pepcid and/or Tums are often very helpful for as needed use.  Thomas [name] Kitchen However, for persisting chronic or daily symptoms, stronger medications like Omeprazole may be needed. . You may need to avoid foods and drinks such as: ? Coffee and tea (with or without caffeine). ? Drinks that contain alcohol. ? Energy drinks and sports drinks. ? Bubbly (carbonated) drinks or sodas. ? Chocolate and cocoa. ? Peppermint and mint flavorings. ? Garlic and onions. ? Horseradish. ? Spicy and acidic foods. These include peppers, chili powder, curry powder, vinegar, hot sauces, and BBQ sauce. ? Citrus fruit juices and citrus fruits, such as oranges, lemons, and limes. ? Tomato-based foods. These include red sauce, chili, salsa, and pizza with red sauce. ? Fried and fatty foods. These include donuts, french fries, potato chips, and high-fat dressings. ? High-fat meats. These include hot dogs, rib eye steak, sausage, ham, and bacon.  Type 2 diabetes mellitus without complication, without long-term current use of insulin (Thomas [name]). Good blood sugar control is important to decrease the likelihood of diabetic complications such as nephropathy, neuropathy, limb loss, blindness, coronary artery disease, and death. Intensive lifestyle modification including diet, exercise and weight loss are the first line of treatment for diabetes. Thomas [name] will decrease carbohydrates and increase activity.  Reactive airway disease without complication, unspecified asthma severity, unspecified whether persistent. Thomas [name] was given a prescription for Albuterol  sulfate inhaler 2 puffs every 6 hours as needed for wheezing/shortness of breath, #1 inhaler with 1 refill.  Class 1 obesity with serious comorbidity and body mass index (BMI) of 34.0 to 34.9 in adult, unspecified obesity type.  Thomas [name] is currently in the action stage of change. As such, his goal is to continue with weight loss efforts. He has agreed to the Category 4 Plan with additional Category 3 breakfast options.   He will work on meal planning and mindful eating.  Exercise goals: Thomas [name] will continue walking and weight training.  Behavioral modification strategies: increasing lean protein intake, decreasing simple carbohydrates, increasing vegetables, increasing water intake, decreasing eating out, no skipping meals, meal planning and cooking strategies and keeping healthy foods in the home.  Thomas [name] has agreed to follow-up with our clinic in 2 weeks. He was informed of the importance of frequent follow-up visits to maximize his success with intensive lifestyle modifications for his multiple health conditions.   Objective:   Blood pressure (!) 146/69, pulse 81, temperature 98.3 F (36.8 C), height 5\' 10"  (1.778 m), weight 242 lb (109.8 kg), SpO2 96 %. Body mass index is 34.72 kg/m.  General: Cooperative, alert, well developed, in no acute distress. HEENT: Conjunctivae and lids unremarkable. Cardiovascular: Regular rhythm.  Lungs: Normal work of breathing. Neurologic: No focal deficits.   Lab Results  Component Value Date   CREATININE 1.02 12/25/2018   BUN 13 12/25/2018   NA 139 12/25/2018   K 4.4 12/25/2018   CL 102 12/25/2018   CO2 30 12/25/2018   Lab Results  Component Value Date   ALT 34 12/25/2018   AST 24 12/25/2018   ALKPHOS 78 12/25/2018   BILITOT 0.7 12/25/2018   Lab Results  Component Value Date   HGBA1C 6.0 12/25/2018   HGBA1C 5.7 (H) 02/12/2018   HGBA1C 6.5 (H) 10/23/2017   HGBA1C 8.6 (H) 07/07/2017   Lab Results  Component Value Date   INSULIN  10.9 02/12/2018   INSULIN 14.1 10/23/2017   Lab Results  Component Value Date   TSH 0.87 12/25/2018   Lab Results  Component Value Date   CHOL 143 10/23/2017   HDL 36 (L) 10/23/2017   LDLCALC 94 10/23/2017   TRIG 64 10/23/2017   CHOLHDL 5 07/31/2017   Lab Results  Component Value Date   WBC 6.6 12/25/2018   HGB 15.2 12/25/2018   HCT 43.9 12/25/2018   MCV 91.7 12/25/2018   PLT 275.0 12/25/2018   No results found for: IRON, TIBC, FERRITIN  Attestation Statements:   Reviewed by clinician on day of visit: allergies, medications, problem list, medical history, surgical history, family history, social history, and previous encounter notes.  Migdalia Dk, am acting as Location manager for [employer], DO   I have reviewed the above documentation for accuracy and completeness, and I agree with the above. Jearld Lesch, DO

## 2019-05-21 ENCOUNTER — Ambulatory Visit (INDEPENDENT_AMBULATORY_CARE_PROVIDER_SITE_OTHER): Payer: BC Managed Care – PPO | Admitting: Bariatrics

## 2019-05-28 ENCOUNTER — Other Ambulatory Visit: Payer: Self-pay | Admitting: Family Medicine

## 2019-06-04 ENCOUNTER — Encounter (INDEPENDENT_AMBULATORY_CARE_PROVIDER_SITE_OTHER): Payer: Self-pay | Admitting: Bariatrics

## 2019-06-04 ENCOUNTER — Ambulatory Visit (INDEPENDENT_AMBULATORY_CARE_PROVIDER_SITE_OTHER): Payer: BC Managed Care – PPO | Admitting: Bariatrics

## 2019-06-04 ENCOUNTER — Other Ambulatory Visit: Payer: Self-pay

## 2019-06-04 VITALS — BP 145/81 | HR 96 | Temp 98.5°F | Ht 70.0 in | Wt 244.0 lb

## 2019-06-04 DIAGNOSIS — J302 Other seasonal allergic rhinitis: Secondary | ICD-10-CM

## 2019-06-04 DIAGNOSIS — K219 Gastro-esophageal reflux disease without esophagitis: Secondary | ICD-10-CM | POA: Diagnosis not present

## 2019-06-04 DIAGNOSIS — Z6835 Body mass index (BMI) 35.0-35.9, adult: Secondary | ICD-10-CM | POA: Diagnosis not present

## 2019-06-04 NOTE — Progress Notes (Signed)
Chief Complaint:   OBESITY Thomas [name] is here to discuss his progress with his obesity treatment plan along with follow-up of his obesity related diagnoses. Thomas [name] is on the Category 4 Plan and states he is following his eating plan approximately 90% of the time. Thomas [name] states he is walking 3 miles and gardening 5 times per week.  Today's visit was #: 20 Starting weight: 245 lbs Starting date: 10/23/2017 Today's weight: 244 lbs Today's date: 06/04/2019 Total lbs lost to date: 1 Total lbs lost since last in-office visit: 0  Interim History: Thomas [name] is up 2 lbs. He states he was on vacation for 3 weeks.  Subjective:   Gastroesophageal reflux disease without esophagitis. Thomas [name] is taking Prevacid.  Seasonal allergies. Thomas [name] is taking Flonase and Ventolin.  Assessment/Plan:   Gastroesophageal reflux disease without esophagitis. Intensive lifestyle modifications are the first line treatment for this issue. We discussed several lifestyle modifications today and he will continue to work on diet, exercise and weight loss efforts. Orders and follow up as documented in patient record. Thomas [name] will continue his medication as directed.  Counseling . If a person has gastroesophageal reflux disease (GERD), food and stomach acid move back up into the esophagus and cause symptoms or problems such as damage to the esophagus. . Anti-reflux measures include: raising the head of the bed, avoiding tight clothing or belts, avoiding eating late at night, not lying down shortly after mealtime, and achieving weight loss. . Avoid ASA, NSAID's, caffeine, alcohol, and tobacco.  . OTC Pepcid and/or Tums are often very helpful for as needed use.  Marland Kitchen However, for persisting chronic or daily symptoms, stronger medications like Omeprazole may be needed. . You may need to avoid foods and drinks such as: ? Coffee and tea (with or without caffeine). ? Drinks that contain alcohol. ? Energy drinks and  sports drinks. ? Bubbly (carbonated) drinks or sodas. ? Chocolate and cocoa. ? Peppermint and mint flavorings. ? Garlic and onions. ? Horseradish. ? Spicy and acidic foods. These include peppers, chili powder, curry powder, vinegar, hot sauces, and BBQ sauce. ? Citrus fruit juices and citrus fruits, such as oranges, lemons, and limes. ? Tomato-based foods. These include red sauce, chili, salsa, and pizza with red sauce. ? Fried and fatty foods. These include donuts, french fries, potato chips, and high-fat dressings. ? High-fat meats. These include hot dogs, rib eye steak, sausage, ham, and bacon.  Seasonal allergies. Thomas [name] will continue his medications as directed.  Class 2 severe obesity with serious comorbidity and body mass index (BMI) of 35.0 to 35.9 in adult, unspecified obesity type ([place]).  Thomas [name] is currently in the action stage of change. As such, his goal is to continue with weight loss efforts. He has agreed to the Category 4 Plan.   He will work on meal planning, intentional eating, and not skipping meals.  Exercise goals: All adults should avoid inactivity. Some physical activity is better than none, and adults who participate in any amount of physical activity gain some health benefits.  Behavioral modification strategies: increasing lean protein intake, decreasing simple carbohydrates, increasing vegetables, increasing water intake, decreasing eating out, no skipping meals, meal planning and cooking strategies, keeping healthy foods in the home and planning for success.  Thomas [name] has agreed to follow-up with our clinic in 2 weeks. He was informed of the importance of frequent follow-up visits to maximize his success with intensive lifestyle modifications for his multiple health conditions.   Objective:   Blood  pressure (!) 145/81, pulse 96, temperature 98.5 F (36.9 C), height 5\' 10"  (1.778 m), weight 244 lb (110.7 kg), SpO2 97 %. Body mass index is 35.01  kg/m.  General: Cooperative, alert, well developed, in no acute distress. HEENT: Conjunctivae and lids unremarkable. Cardiovascular: Regular rhythm.  Lungs: Normal work of breathing. Neurologic: No focal deficits.   Lab Results  Component Value Date   CREATININE 1.02 12/25/2018   BUN 13 12/25/2018   NA 139 12/25/2018   K 4.4 12/25/2018   CL 102 12/25/2018   CO2 30 12/25/2018   Lab Results  Component Value Date   ALT 34 12/25/2018   AST 24 12/25/2018   ALKPHOS 78 12/25/2018   BILITOT 0.7 12/25/2018   Lab Results  Component Value Date   HGBA1C 6.0 12/25/2018   HGBA1C 5.7 (H) 02/12/2018   HGBA1C 6.5 (H) 10/23/2017   HGBA1C 8.6 (H) 07/07/2017   Lab Results  Component Value Date   INSULIN 10.9 02/12/2018   INSULIN 14.1 10/23/2017   Lab Results  Component Value Date   TSH 0.87 12/25/2018   Lab Results  Component Value Date   CHOL 143 10/23/2017   HDL 36 (L) 10/23/2017   LDLCALC 94 10/23/2017   TRIG 64 10/23/2017   CHOLHDL 5 07/31/2017   Lab Results  Component Value Date   WBC 6.6 12/25/2018   HGB 15.2 12/25/2018   HCT 43.9 12/25/2018   MCV 91.7 12/25/2018   PLT 275.0 12/25/2018   No results found for: IRON, TIBC, FERRITIN  Attestation Statements:   Reviewed by clinician on day of visit: allergies, medications, problem list, medical history, surgical history, family history, social history, and previous encounter notes.  Time spent on visit including pre-visit chart review and post-visit charting and care was 20 minutes.   Migdalia Dk, am acting as Location manager for [employer], DO   I have reviewed the above documentation for accuracy and completeness, and I agree with the above. Jearld Lesch, DO

## 2019-06-25 ENCOUNTER — Ambulatory Visit (INDEPENDENT_AMBULATORY_CARE_PROVIDER_SITE_OTHER): Payer: BC Managed Care – PPO | Admitting: Bariatrics

## 2019-06-26 ENCOUNTER — Encounter (INDEPENDENT_AMBULATORY_CARE_PROVIDER_SITE_OTHER): Payer: Self-pay | Admitting: Bariatrics

## 2019-06-26 ENCOUNTER — Other Ambulatory Visit: Payer: Self-pay

## 2019-06-26 ENCOUNTER — Ambulatory Visit (INDEPENDENT_AMBULATORY_CARE_PROVIDER_SITE_OTHER): Payer: BC Managed Care – PPO | Admitting: Bariatrics

## 2019-06-26 VITALS — BP 136/73 | HR 82 | Temp 98.2°F | Ht 70.0 in | Wt 244.0 lb

## 2019-06-26 DIAGNOSIS — E119 Type 2 diabetes mellitus without complications: Secondary | ICD-10-CM | POA: Diagnosis not present

## 2019-06-26 DIAGNOSIS — K219 Gastro-esophageal reflux disease without esophagitis: Secondary | ICD-10-CM | POA: Diagnosis not present

## 2019-06-26 DIAGNOSIS — Z6835 Body mass index (BMI) 35.0-35.9, adult: Secondary | ICD-10-CM

## 2019-06-26 MED ORDER — LANSOPRAZOLE 15 MG PO CPDR: 15.0000 mg | DELAYED_RELEASE_CAPSULE | Freq: Every day | ORAL | 0 refills | Status: DC

## 2019-06-26 NOTE — Progress Notes (Signed)
Chief Complaint:   Thomas [name] is here to discuss his progress with his Thomas treatment plan along with follow-up of his Thomas related diagnoses. Thomas [name] is on the Category 4 Plan and states he is following his eating plan approximately 90% of the time. Thomas [name] states he is walking 3 miles 4 times per week.  Today's visit was #: 21 Starting weight: 245 lbs Starting date: 10/23/2017 Today's weight: 244 lbs Today's date: 06/26/2019 Total lbs lost to date: 1 Total lbs lost since last in-office visit: 0  Interim History: Thomas [name] weight remains the same. He states that he is losing inches.  Subjective:   Gastroesophageal reflux disease without esophagitis. Thomas [name] is taking Prevacid.  Type 2 diabetes mellitus without complication, without long-term current use of insulin (Thomas [name]). Thomas [name] is on no medication. Diabetes is extremely well controlled.   Lab Results  Component Value Date   HGBA1C 6.0 12/25/2018   HGBA1C 5.7 (H) 02/12/2018   HGBA1C 6.5 (H) 10/23/2017   Lab Results  Component Value Date   LDLCALC 94 10/23/2017   CREATININE 1.02 12/25/2018   Lab Results  Component Value Date   INSULIN 10.9 02/12/2018   INSULIN 14.1 10/23/2017   Assessment/Plan:   Gastroesophageal reflux disease without esophagitis. Intensive lifestyle modifications are the first line treatment for this issue. We discussed several lifestyle modifications today and he will continue to work on diet, exercise and weight loss efforts. Orders and follow up as documented in patient record. Prescription was given for lansoprazole (PREVACID) 15 MG capsule 1 PO daily #90 with 0 refills.  Counseling . If a person has gastroesophageal reflux disease (GERD), food and stomach acid move back up into the esophagus and cause symptoms or problems such as damage to the esophagus. . Anti-reflux measures include: raising the head of the bed, avoiding tight clothing or belts, avoiding eating late at  night, not lying down shortly after mealtime, and achieving weight loss. . Avoid ASA, NSAID's, caffeine, alcohol, and tobacco.  . OTC Pepcid and/or Tums are often very helpful for as needed use.  Marland Kitchen However, for persisting chronic or daily symptoms, stronger medications like Omeprazole may be needed. . You may need to avoid foods and drinks such as: ? Coffee and tea (with or without caffeine). ? Drinks that contain alcohol. ? Energy drinks and sports drinks. ? Bubbly (carbonated) drinks or sodas. ? Chocolate and cocoa. ? Peppermint and mint flavorings. ? Garlic and onions. ? Horseradish. ? Spicy and acidic foods. These include peppers, chili powder, curry powder, vinegar, hot sauces, and BBQ sauce. ? Citrus fruit juices and citrus fruits, such as oranges, lemons, and limes. ? Tomato-based foods. These include red sauce, chili, salsa, and pizza with red sauce. ? Fried and fatty foods. These include donuts, french fries, potato chips, and high-fat dressings. ? High-fat meats. These include hot dogs, rib eye steak, sausage, ham, and bacon.   Type 2 diabetes mellitus without complication, without long-term current use of insulin ([name]). Good blood sugar control is important to decrease the likelihood of diabetic complications such as nephropathy, neuropathy, limb loss, blindness, coronary artery disease, and death. Intensive lifestyle modification including diet, exercise and weight loss are the first line of treatment for diabetes. Thomas [name] will decrease carbohydrates and increase healthy fats and protein.  Class 2 severe Thomas with serious comorbidity and body mass index (BMI) of 35.0 to 35.9 in adult, unspecified Thomas type (Thomas [name]).  Thomas [name] is currently in the action stage of change. As  such, his goal is to continue with weight loss efforts. He has agreed to the Category 4 Plan.   He will work on meal planning, decrease calories to Category 3, and avoid salt in his diet.  Exercise goals:  All adults should avoid inactivity. Some physical activity is better than none, and adults who participate in any amount of physical activity gain some health benefits.  Behavioral modification strategies: increasing lean protein intake, decreasing simple carbohydrates, increasing vegetables, increasing water intake, decreasing eating out, no skipping meals, meal planning and cooking strategies and keeping healthy foods in the home.  Thomas [name] has agreed to follow-up with our clinic in 3-4 weeks. He was informed of the importance of frequent follow-up visits to maximize his success with intensive lifestyle modifications for his multiple health conditions.   Objective:   Blood pressure 136/73, pulse 82, temperature 98.2 F (36.8 C), temperature source Oral, height 5\' 10"  (1.778 m), weight 244 lb (110.7 kg), SpO2 98 %. Body mass index is 35.01 kg/m.  General: Cooperative, alert, well developed, in no acute distress. HEENT: Conjunctivae and lids unremarkable. Cardiovascular: Regular rhythm.  Lungs: Normal work of breathing. Neurologic: No focal deficits.   Lab Results  Component Value Date   CREATININE 1.02 12/25/2018   BUN 13 12/25/2018   NA 139 12/25/2018   K 4.4 12/25/2018   CL 102 12/25/2018   CO2 30 12/25/2018   Lab Results  Component Value Date   ALT 34 12/25/2018   AST 24 12/25/2018   ALKPHOS 78 12/25/2018   BILITOT 0.7 12/25/2018   Lab Results  Component Value Date   HGBA1C 6.0 12/25/2018   HGBA1C 5.7 (H) 02/12/2018   HGBA1C 6.5 (H) 10/23/2017   HGBA1C 8.6 (H) 07/07/2017   Lab Results  Component Value Date   INSULIN 10.9 02/12/2018   INSULIN 14.1 10/23/2017   Lab Results  Component Value Date   TSH 0.87 12/25/2018   Lab Results  Component Value Date   CHOL 143 10/23/2017   HDL 36 (L) 10/23/2017   LDLCALC 94 10/23/2017   TRIG 64 10/23/2017   CHOLHDL 5 07/31/2017   Lab Results  Component Value Date   WBC 6.6 12/25/2018   HGB 15.2 12/25/2018   HCT 43.9  12/25/2018   MCV 91.7 12/25/2018   PLT 275.0 12/25/2018   No results found for: IRON, TIBC, FERRITIN  Attestation Statements:   Reviewed by clinician on day of visit: allergies, medications, problem list, medical history, surgical history, family history, social history, and previous encounter notes.  Migdalia Dk, am acting as Location manager for [employer], DO   I have reviewed the above documentation for accuracy and completeness, and I agree with the above. Jearld Lesch, DO

## 2019-07-02 ENCOUNTER — Telehealth: Payer: Self-pay | Admitting: Family Medicine

## 2019-07-02 ENCOUNTER — Telehealth (INDEPENDENT_AMBULATORY_CARE_PROVIDER_SITE_OTHER): Payer: BC Managed Care – PPO | Admitting: Internal Medicine

## 2019-07-02 DIAGNOSIS — J209 Acute bronchitis, unspecified: Secondary | ICD-10-CM

## 2019-07-02 MED ORDER — AZITHROMYCIN 250 MG PO TABS: ORAL_TABLET | ORAL | 0 refills | Status: DC

## 2019-07-02 NOTE — Progress Notes (Signed)
Virtual Visit via Video Note  I connected with Thomas [name] on 07/02/19 at  3:45 PM EDT by a video enabled telemedicine application and verified that I am speaking with the correct person using two identifiers.  Location patient: home Location provider: work office Persons participating in the virtual visit: patient, provider  I discussed the limitations of evaluation and management by telemedicine and the availability of in person appointments. The patient expressed understanding and agreed to proceed.   HPI: He has scheduled this visit to discuss some acute issues.  He has a h/o recurrent sinus infections that have improved since having sinus surgery 3 years ago.  Over the past 4 to 5 days he has been feeling more fatigued, has been having a cough productive of copious amounts of green phlegm.  He also has been having rhinorrhea, he denies fevers, he has been doing sinus flushes and taking Claritin over-the-counter.  He completed his Covid vaccination series as of March 6.  He has no sick contacts or recent travel.  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  He is an asthmatic and is concerned because the sinus infections will usually precipitate an asthma attack.   ROS: Constitutional: Denies fever, chills, diaphoresis, appetite change. HEENT: Denies photophobia, eye pain, redness, hearing loss, ear pain,  mouth sores, trouble swallowing, neck pain, neck stiffness and tinnitus.   Respiratory: Denies SOB, DOE,  chest tightness,  and wheezing.   Cardiovascular: Denies chest pain, palpitations and leg swelling.  Gastrointestinal: Denies nausea, vomiting, abdominal pain, diarrhea, constipation, blood in stool and abdominal distention.  Genitourinary: Denies dysuria, urgency, frequency, hematuria, flank pain and difficulty urinating.  Endocrine: Denies: hot or cold intolerance, sweats, changes in hair or nails, polyuria,  polydipsia. Musculoskeletal: Denies myalgias, back pain, joint swelling, arthralgias and gait problem.  Skin: Denies pallor, rash and wound.  Neurological: Denies dizziness, seizures, syncope, weakness, light-headedness, numbness and headaches.  Hematological: Denies adenopathy. Easy bruising, personal or family bleeding history  Psychiatric/Behavioral: Denies suicidal ideation, mood changes, confusion, nervousness, sleep disturbance and agitation   Past Medical History:  Diagnosis Date  . Allergy   . Asthma   . Back pain   . Diabetes ([place])   . Dyspnea   . GERD (gastroesophageal reflux disease)   . History of hiatal hernia   . Pneumonia    5 -7 yrs ago  . Swallowing difficulty     Past Surgical History:  Procedure Laterality Date  . ANTERIOR CERVICAL DECOMP/DISCECTOMY FUSION N/A 07/14/2016   Procedure: Cervical five-six5- Cervical six-seven Anterior cervical decompression/discectomy/fusion;  Surgeon: Erline Levine, MD;  Location: [place];  Service: Neurosurgery;  Laterality: N/A;  . COLON SURGERY    . COLONOSCOPY  12/22/2014   per Dr. Earlean Shawl, clear, repeat in 5 yrs (hx of adenomas)   . DG THUMB LEFT HAND    . DG THUMB RIGHT HAND ([place] HX)    . FUNCTIONAL ENDOSCOPIC SINUS SURGERY    . PARTIAL COLECTOMY     sigmoid, Dr. Earlean Shawl    Family History  Problem Relation Age of Onset  . Cancer Mother   . Cancer Father   . Hyperlipidemia Father   . Diabetes Unknown        maternal family    SOCIAL HX:   reports that he quit smoking about 33 years ago. His smoking use included cigarettes. He has a 10.00 pack-year smoking history. He has never used  smokeless tobacco. He reports current alcohol use. He reports that he does not use drugs.   Current Outpatient Medications:  .  albuterol (PROAIR HFA) 108 (90 Base) MCG/ACT inhaler, INHALE 2 PUFFS EVERY 6 HOURS AS NEEDED FOR WHEEZING OR SHORTNESS OF BREATH., Disp: 8 g, Rfl: 1 .  budesonide-formoterol (SYMBICORT) 160-4.5 MCG/ACT inhaler,  TAKE 2 PUFFS BY MOUTH TWICE A DAY, Disp: 30.6 Inhaler, Rfl: 5 .  fluticasone (FLONASE) 50 MCG/ACT nasal spray, SPRAY 2 SPRAYS INTO EACH NOSTRIL EVERY DAY, Disp: 48 mL, Rfl: 0 .  lansoprazole (PREVACID) 15 MG capsule, Take 1 capsule (15 mg total) by mouth daily., Disp: 90 capsule, Rfl: 0 .  montelukast (SINGULAIR) 10 MG tablet, Take 1 tablet (10 mg total) by mouth daily., Disp: 90 tablet, Rfl: 0 .  naproxen (NAPROSYN) 500 MG tablet, TAKE 1 TABLET BY MOUTH TIMES DAILY WITH A MEAL., Disp: 60 tablet, Rfl: 1 .  azithromycin (ZITHROMAX) 250 MG tablet, Take as directed, Disp: 6 tablet, Rfl: 0  EXAM:   VITALS per patient if applicable: None reported  GENERAL: alert, oriented, appears well and in no acute distress, hoarse voice, coughing throughout our interview  HEENT: atraumatic, conjunttiva clear, no obvious abnormalities on inspection of external nose and ears  NECK: normal movements of the head and neck  LUNGS: on inspection no signs of respiratory distress, breathing rate appears normal, no obvious gross increased work of breathing, gasping or wheezing  CV: no obvious cyanosis  MS: moves all visible extremities without noticeable abnormality  PSYCH/NEURO: pleasant and cooperative, no obvious depression or anxiety, speech and thought processing grossly intact  ASSESSMENT AND PLAN:   Acute bronchitis, unspecified organism  -Since he has a history of asthma, given his copious amounts of green sputum production with his cough, I will go ahead and treat as acute bronchitis with Z-Pak. -He may also use an over-the-counter nasal decongestant/cough suppressant/antihistamine as he sees fit. -He has been advised to return to clinic in 7 to 10 days or sooner if no significant improvement or worsening.    I discussed the assessment and treatment plan with the patient. The patient was provided an opportunity to ask questions and all were answered. The patient agreed with the plan and demonstrated  an understanding of the instructions.   The patient was advised to call back or seek an in-person evaluation if the symptoms worsen or if the condition fails to improve as anticipated.    Lelon Frohlich, MD   Primary Care at [facility]

## 2019-07-02 NOTE — Telephone Encounter (Signed)
No longer needed

## 2019-07-18 ENCOUNTER — Ambulatory Visit (INDEPENDENT_AMBULATORY_CARE_PROVIDER_SITE_OTHER): Payer: BC Managed Care – PPO | Admitting: Bariatrics

## 2019-07-22 ENCOUNTER — Other Ambulatory Visit: Payer: Self-pay | Admitting: Family Medicine

## 2019-07-22 DIAGNOSIS — J019 Acute sinusitis, unspecified: Secondary | ICD-10-CM

## 2019-07-22 NOTE — Telephone Encounter (Signed)
Patient need to schedule an ov for more refills. 

## 2019-07-23 ENCOUNTER — Other Ambulatory Visit: Payer: Self-pay

## 2019-07-24 ENCOUNTER — Encounter: Payer: Self-pay | Admitting: Family Medicine

## 2019-07-24 ENCOUNTER — Ambulatory Visit (INDEPENDENT_AMBULATORY_CARE_PROVIDER_SITE_OTHER): Payer: BC Managed Care – PPO | Admitting: Family Medicine

## 2019-07-24 VITALS — BP 130/70 | HR 69 | Temp 98.1°F | Wt 248.2 lb

## 2019-07-24 DIAGNOSIS — S00412A Abrasion of left ear, initial encounter: Secondary | ICD-10-CM

## 2019-07-24 DIAGNOSIS — K219 Gastro-esophageal reflux disease without esophagitis: Secondary | ICD-10-CM | POA: Diagnosis not present

## 2019-07-24 DIAGNOSIS — B9689 Other specified bacterial agents as the cause of diseases classified elsewhere: Secondary | ICD-10-CM

## 2019-07-24 DIAGNOSIS — J019 Acute sinusitis, unspecified: Secondary | ICD-10-CM | POA: Diagnosis not present

## 2019-07-24 MED ORDER — FLUTICASONE PROPIONATE 50 MCG/ACT NA SUSP: NASAL | 3 refills | Status: DC

## 2019-07-24 MED ORDER — LANSOPRAZOLE 15 MG PO CPDR: 15.0000 mg | DELAYED_RELEASE_CAPSULE | Freq: Every day | ORAL | 3 refills | Status: DC

## 2019-07-24 MED ORDER — ALBUTEROL SULFATE (2.5 MG/3ML) 0.083% IN NEBU: 2.5000 mg | INHALATION_SOLUTION | Freq: Four times a day (QID) | RESPIRATORY_TRACT | 5 refills | Status: DC | PRN

## 2019-07-24 NOTE — Progress Notes (Signed)
° °  Subjective:    Patient ID: Thomas [name], male    DOB: Jul 29, 1960, 59 y.o.   MRN: [id number]  HPI Here to check his left ear. For the past 2 weeks it has been itchy in the canal and he sometimes feels a popping sensation. No pain or DC. No sinus congestion. He admits to using Q Tips in the ears every week or two.    Review of Systems  Constitutional: Negative.   HENT: Negative for congestion, ear discharge, ear pain, facial swelling, postnasal drip, sinus pressure and sore throat.   Eyes: Negative.   Respiratory: Negative.        Objective:   Physical Exam Constitutional:      Appearance: Normal appearance. He is not ill-appearing.  HENT:     Right Ear: Tympanic membrane, ear canal and external ear normal.     Left Ear: Tympanic membrane and external ear normal.     Ears:     Comments: Left canal has a small abrasion which appears to be healing    Nose: Nose normal.     Mouth/Throat:     Pharynx: Oropharynx is clear.  Cardiovascular:     Rate and Rhythm: Normal rate and regular rhythm.     Pulses: Normal pulses.     Heart sounds: Normal heart sounds.  Pulmonary:     Effort: Pulmonary effort is normal.     Breath sounds: Normal breath sounds.  Lymphadenopathy:     Cervical: No cervical adenopathy.  Neurological:     Mental Status: He is alert.           Assessment & Plan:  Abrasion of the ear canal. This is healing as expected. I advised him to never use Q Tips in the ears.  Alysia Penna, MD

## 2019-08-26 ENCOUNTER — Other Ambulatory Visit: Payer: Self-pay | Admitting: Pulmonary Disease

## 2019-10-16 ENCOUNTER — Encounter: Payer: Self-pay | Admitting: Family Medicine

## 2019-10-16 ENCOUNTER — Other Ambulatory Visit: Payer: Self-pay | Admitting: Family Medicine

## 2019-10-16 ENCOUNTER — Other Ambulatory Visit: Payer: Self-pay | Admitting: Pulmonary Disease

## 2019-10-16 NOTE — Telephone Encounter (Signed)
Pt call and want a refill for 90 days on   ketoconazole (NIZORAL) 2 % cream   [id number] [name], [place] - [address] Phone:  [phone]  Fax:  [phone]

## 2019-10-16 NOTE — Telephone Encounter (Signed)
This medication is not on current list. Please advise if ok to refill.

## 2019-10-18 NOTE — Telephone Encounter (Signed)
Call in 45 grams with 5 rf, apply BID as needed

## 2019-10-21 MED ORDER — KETOCONAZOLE 2 % EX CREA: 1.0000 "application " | TOPICAL_CREAM | Freq: Two times a day (BID) | CUTANEOUS | 5 refills | Status: DC | PRN

## 2019-10-21 NOTE — Telephone Encounter (Signed)
Rx sent 

## 2019-11-20 ENCOUNTER — Encounter: Payer: Self-pay | Admitting: Family Medicine

## 2019-11-20 ENCOUNTER — Telehealth (INDEPENDENT_AMBULATORY_CARE_PROVIDER_SITE_OTHER): Payer: BC Managed Care – PPO | Admitting: Family Medicine

## 2019-11-20 VITALS — Temp 97.1°F

## 2019-11-20 DIAGNOSIS — J329 Chronic sinusitis, unspecified: Secondary | ICD-10-CM | POA: Diagnosis not present

## 2019-11-20 MED ORDER — AMOXICILLIN-POT CLAVULANATE 875-125 MG PO TABS
1.0000 | ORAL_TABLET | Freq: Two times a day (BID) | ORAL | 0 refills | Status: DC
Start: 2019-11-20 — End: 2019-12-04

## 2019-11-20 NOTE — Progress Notes (Signed)
Subjective:    Patient ID: Thomas [name], male    DOB: 07/09/1960, 59 y.o.   MRN: [id number]  HPI Virtual Visit via Video Note  I connected with the patient on 11/20/19 at  1:15 PM EDT by a video enabled telemedicine application and verified that I am speaking with the correct person using two identifiers.  Location patient: home Location provider:work or home office Persons participating in the virtual visit: patient, provider  I discussed the limitations of evaluation and management by telemedicine and the availability of in person appointments. The patient expressed understanding and agreed to proceed.   HPI: Here for one week of a sinus infection. He has sinus pressure, PND, ST, and is blowing yellow mucus from the nose. No cough or fever.    ROS: See pertinent positives and negatives per HPI.  Past Medical History:  Diagnosis Date  . Allergy   . Asthma   . Back pain   . Diabetes ([place])   . Dyspnea   . GERD (gastroesophageal reflux disease)   . History of hiatal hernia   . Pneumonia    5 -7 yrs ago  . Swallowing difficulty     Past Surgical History:  Procedure Laterality Date  . ANTERIOR CERVICAL DECOMP/DISCECTOMY FUSION N/A 07/14/2016   Procedure: Cervical five-six5- Cervical six-seven Anterior cervical decompression/discectomy/fusion;  Surgeon: Erline Levine, MD;  Location: [facility];  Service: Neurosurgery;  Laterality: N/A;  . COLON SURGERY    . COLONOSCOPY  12/22/2014   per Dr. Earlean Shawl, clear, repeat in 5 yrs (hx of adenomas)   . DG THUMB LEFT HAND    . DG THUMB RIGHT HAND ([place] HX)    . FUNCTIONAL ENDOSCOPIC SINUS SURGERY    . PARTIAL COLECTOMY     sigmoid, Dr. Earlean Shawl    Family History  Problem Relation Age of Onset  . Cancer Mother   . Cancer Father   . Hyperlipidemia Father   . Diabetes Other        maternal family     Current Outpatient Medications:  .  albuterol (PROAIR HFA) 108 (90 Base) MCG/ACT inhaler, INHALE 2 PUFFS EVERY 6 HOURS AS NEEDED FOR  WHEEZING OR SHORTNESS OF BREATH., Disp: 8 g, Rfl: 1 .  albuterol (PROVENTIL) (2.5 MG/3ML) 0.083% nebulizer solution, Take 3 mLs (2.5 mg total) by nebulization every 6 (six) hours as needed for wheezing or shortness of breath., Disp: 75 mL, Rfl: 5 .  amoxicillin-clavulanate (AUGMENTIN) 875-125 MG tablet, Take 1 tablet by mouth 2 (two) times daily., Disp: 20 tablet, Rfl: 0 .  budesonide-formoterol (SYMBICORT) 160-4.5 MCG/ACT inhaler, TAKE 2 PUFFS BY MOUTH TWICE A DAY, Disp: 30.6 Inhaler, Rfl: 5 .  fluticasone (FLONASE) 50 MCG/ACT nasal spray, SPRAY 2 SPRAYS INTO EACH NOSTRIL EVERY DAY, Disp: 48 mL, Rfl: 3 .  ketoconazole (NIZORAL) 2 % cream, Apply 1 application topically 2 (two) times daily as needed for irritation., Disp: 45 g, Rfl: 5 .  lansoprazole (PREVACID) 15 MG capsule, Take 1 capsule (15 mg total) by mouth daily., Disp: 90 capsule, Rfl: 3 .  montelukast (SINGULAIR) 10 MG tablet, TAKE 1 TABLET BY MOUTH EVERY DAY, Disp: 90 tablet, Rfl: 0 .  naproxen (NAPROSYN) 500 MG tablet, TAKE 1 TABLET BY MOUTH TIMES DAILY WITH A MEAL., Disp: 60 tablet, Rfl: 1  EXAM:  VITALS per patient if applicable:  GENERAL: alert, oriented, appears well and in no acute distress  HEENT: atraumatic, conjunttiva clear, no obvious abnormalities on inspection of external nose and ears  NECK: normal movements of the head and neck  LUNGS: on inspection no signs of respiratory distress, breathing rate appears normal, no obvious gross SOB, gasping or wheezing  CV: no obvious cyanosis  MS: moves all visible extremities without noticeable abnormality  PSYCH/NEURO: pleasant and cooperative, no obvious depression or anxiety, speech and thought processing grossly intact  ASSESSMENT AND PLAN: Sinusitis, treat with Augmentin. Add Mucinex prn. Alysia Penna, MD  Discussed the following assessment and plan:  Chronic sinusitis, unspecified location - Plan: Ambulatory referral to ENT     I discussed the assessment and  treatment plan with the patient. The patient was provided an opportunity to ask questions and all were answered. The patient agreed with the plan and demonstrated an understanding of the instructions.   The patient was advised to call back or seek an in-person evaluation if the symptoms worsen or if the condition fails to improve as anticipated.     Review of Systems     Objective:   Physical Exam        Assessment & Plan:

## 2019-11-21 ENCOUNTER — Telehealth: Payer: Self-pay | Admitting: *Deleted

## 2019-11-21 MED ORDER — METHYLPREDNISOLONE 4 MG PO TBPK: ORAL_TABLET | ORAL | 0 refills | Status: DC

## 2019-11-21 NOTE — Telephone Encounter (Signed)
I sent in a Medrol dose pack  

## 2019-11-21 NOTE — Telephone Encounter (Signed)
Patient called stating he had a mychart visit with Dr Sarajane Jews yesterday. Patient wants to know if Dr Sarajane Jews can prescribe the prednisone that they discuessed yesterday. Please advise [phone]

## 2019-11-21 NOTE — Addendum Note (Signed)
Addended by: Alysia Penna A on: 11/21/2019 12:10 PM   Modules accepted: Orders

## 2019-12-04 ENCOUNTER — Encounter: Payer: Self-pay | Admitting: Family Medicine

## 2019-12-04 ENCOUNTER — Ambulatory Visit: Payer: BC Managed Care – PPO | Admitting: Family Medicine

## 2019-12-04 ENCOUNTER — Ambulatory Visit (INDEPENDENT_AMBULATORY_CARE_PROVIDER_SITE_OTHER): Payer: BC Managed Care – PPO

## 2019-12-04 ENCOUNTER — Other Ambulatory Visit: Payer: Self-pay

## 2019-12-04 VITALS — BP 138/78 | HR 83 | Temp 98.1°F | Ht 71.0 in | Wt 247.0 lb

## 2019-12-04 DIAGNOSIS — S63632A Sprain of interphalangeal joint of right middle finger, initial encounter: Secondary | ICD-10-CM

## 2019-12-04 DIAGNOSIS — Z23 Encounter for immunization: Secondary | ICD-10-CM | POA: Diagnosis not present

## 2019-12-04 NOTE — Progress Notes (Signed)
   Subjective:    Patient ID: JULIUS MATUS, male    DOB: Jun 01, 1960, 59 y.o.   MRN: [id number]  HPI Here for an injury to the right 3rd finger 3 days ago. He was attempting to break a piece of plastic off a new belt when he felt a "pop" and had a sudden pain in the PIP joint. It swelled up quite a bit, but now the swelling is down some. He has been taking naproxen.   Review of Systems  Constitutional: Negative.   Respiratory: Negative.   Cardiovascular: Negative.   Musculoskeletal: Positive for arthralgias.       Objective:   Physical Exam Constitutional:      General: He is not in acute distress.    Appearance: Normal appearance.  Cardiovascular:     Rate and Rhythm: Normal rate and regular rhythm.     Pulses: Normal pulses.     Heart sounds: Normal heart sounds.  Pulmonary:     Effort: Pulmonary effort is normal.     Breath sounds: Normal breath sounds.  Musculoskeletal:     Comments: The right 3rd PIP joint is swollen and tender, not warm or red. No ecchymosis. Extension is full, but he cannot achieve full flexion. No crepitus   Neurological:     Mental Status: He is alert.           Assessment & Plan:  Finger sprain. We will get Xrays today. He will rest the finger and apply ice packs. This should resolve over the next few weeks.  Alysia Penna, MD

## 2020-01-14 ENCOUNTER — Ambulatory Visit: Payer: BC Managed Care – PPO | Attending: Internal Medicine

## 2020-01-14 ENCOUNTER — Encounter: Payer: BC Managed Care – PPO | Admitting: Family Medicine

## 2020-01-14 DIAGNOSIS — Z23 Encounter for immunization: Secondary | ICD-10-CM

## 2020-01-14 NOTE — Progress Notes (Signed)
   Covid-19 Vaccination Clinic  Name:  Thomas [name]    MRN: [id number] DOB: 09-27-1960  01/14/2020  Thomas [name] was observed post Covid-19 immunization for 15 minutes without incident. He was provided with Vaccine Information Sheet and instruction to access the V-Safe system.   Thomas [name] was instructed to call 911 with any severe reactions post vaccine: Marland Kitchen Difficulty breathing  . Swelling of face and throat  . A fast heartbeat  . A bad rash all over body  . Dizziness and weakness   Immunizations Administered    Name Date Dose VIS Date Route   Pfizer COVID-19 Vaccine 01/14/2020  1:22 PM 0.3 mL 11/20/2019 Intramuscular   Manufacturer: [place]   Lot: [id number]   [place]: [id number]

## 2020-02-03 ENCOUNTER — Other Ambulatory Visit: Payer: Self-pay | Admitting: Pulmonary Disease

## 2020-02-28 ENCOUNTER — Other Ambulatory Visit: Payer: Self-pay

## 2020-03-03 ENCOUNTER — Other Ambulatory Visit: Payer: Self-pay | Admitting: Pulmonary Disease

## 2020-03-03 ENCOUNTER — Telehealth: Payer: Self-pay | Admitting: Pulmonary Disease

## 2020-03-03 MED ORDER — BUDESONIDE-FORMOTEROL FUMARATE 160-4.5 MCG/ACT IN AERO: INHALATION_SPRAY | RESPIRATORY_TRACT | 1 refills | Status: DC

## 2020-03-03 MED ORDER — MONTELUKAST SODIUM 10 MG PO TABS
10.0000 mg | ORAL_TABLET | Freq: Every day | ORAL | 0 refills | Status: DC
Start: 2020-03-03 — End: 2020-04-03

## 2020-03-03 NOTE — Telephone Encounter (Signed)
Called spoke with patient.  I told him at his appointment in March he needs to ask for more refills as I am sending in 1 refill of each to get him to his next appointment.  Patient verbalized understanding.  Nothing further needed at this time.

## 2020-04-03 ENCOUNTER — Telehealth: Payer: Self-pay | Admitting: Pulmonary Disease

## 2020-04-03 MED ORDER — MONTELUKAST SODIUM 10 MG PO TABS
10.0000 mg | ORAL_TABLET | Freq: Every day | ORAL | 0 refills | Status: DC
Start: 2020-04-03 — End: 2020-04-29

## 2020-04-03 NOTE — Telephone Encounter (Signed)
Refill has been sent to the pharmacy and pt has pending appt on 03/10

## 2020-04-09 ENCOUNTER — Encounter: Payer: Self-pay | Admitting: Pulmonary Disease

## 2020-04-09 ENCOUNTER — Other Ambulatory Visit: Payer: Self-pay

## 2020-04-09 ENCOUNTER — Ambulatory Visit: Payer: BC Managed Care – PPO | Admitting: Pulmonary Disease

## 2020-04-09 VITALS — BP 142/78 | HR 88 | Temp 97.3°F | Ht 71.0 in | Wt 254.8 lb

## 2020-04-09 DIAGNOSIS — J4540 Moderate persistent asthma, uncomplicated: Secondary | ICD-10-CM

## 2020-04-09 DIAGNOSIS — R079 Chest pain, unspecified: Secondary | ICD-10-CM | POA: Diagnosis not present

## 2020-04-09 LAB — TROPONIN I (HIGH SENSITIVITY): High Sens Troponin I: 7 ng/L (ref 2–17)

## 2020-04-09 NOTE — Patient Instructions (Signed)
We will get an EKG and check some troponin labs  Continue the Symbicort and Singulair Please follow-up with your primary care regarding the intermittent chest pain  Return to clinic in 1 year.

## 2020-04-09 NOTE — Progress Notes (Signed)
Thomas [name]    [id number]    Jan 16, 1961  Primary Care Physician:Fry, Ishmael Holter, MD  Referring Physician: Laurey Morale, MD [place],   [id number]  Chief complaint: Follow up for moderate persistent asthma, nasal polyposis, chronic sinusitis.  HPI: 60 year old with history of allergies, asthma, diabetes, GERD, dyspnea  Follows with Dr. 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   Underwent FEES in April 2019.   He was on aspirin for chronic pain but is not using it anymore.  He was originally maintained on Qvar and albuterol with poor control of symptoms. Started on Symbicort and Singulair in Oct 2019 with significant improvement in symptoms  Pets: Used to have a dog, no cats, birds, farm animals Occupation: Works as a Administrator for [employer], Theme park manager Exposures: No known exposures, mold mold, hot tub, Jacuzzi Smoking history: 10-pack-year smoker.  Quit in 1990 Travel history: Drives to [place] as a Administrator.  No other significant travel. Relevant family history: No significant family history of lung disease.  Interim history: Continues on Symbicort, Singulair States that breathing is doing well with no issues.  Hardly needs to use his rescue inhaler  New complaint is intermittent subcostal chest pain.  He complains of persistent pain on the for the past 1 day.  He is try to make an appointment with primary care for further evaluation  Outpatient Encounter Medications as of 04/09/2020  Medication Sig  . albuterol (PROAIR HFA) 108 (90 Base) MCG/ACT inhaler INHALE 2 PUFFS EVERY 6 HOURS AS NEEDED FOR WHEEZING OR SHORTNESS OF BREATH.  . budesonide-formoterol (SYMBICORT) 160-4.5 MCG/ACT inhaler TAKE 2 PUFFS BY MOUTH TWICE A DAY  . fluticasone (FLONASE) 50 MCG/ACT nasal spray SPRAY 2 SPRAYS INTO EACH NOSTRIL EVERY DAY  . ketoconazole (NIZORAL) 2 % cream Apply 1 application topically 2 (two) times daily as needed for  irritation.  . lansoprazole (PREVACID) 15 MG capsule Take 1 capsule (15 mg total) by mouth daily.  . montelukast (SINGULAIR) 10 MG tablet Take 1 tablet (10 mg total) by mouth daily.   No facility-administered encounter medications on file as of 04/09/2020.   Physical Exam: Blood pressure (!) 142/78, pulse 88, temperature (!) 97.3 F (36.3 C), temperature source Temporal, height 5\' 11"  (1.803 m), weight 254 lb 12.8 oz (115.6 kg), SpO2 97 %. Gen:      No acute distress HEENT:  EOMI, sclera anicteric Neck:     No masses; no thyromegaly Lungs:    Clear to auscultation bilaterally; normal respiratory effort CV:         Regular rate and rhythm; no murmurs Abd:      + bowel sounds; soft, non-tender; no palpable masses, no distension Ext:    No edema; adequate peripheral perfusion Skin:      Warm and dry; no rash Neuro: alert and oriented x 3 Psych: normal mood and affect  Data Reviewed: Imaging: Chest x-ray 08/12/2017- chronic bronchitic changes, no acute cardiopulmonary ab normalities.  I have reviewed the images personally.  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  PFTs show mild restriction but there is no evidence of interstitial lung disease on chest  x-ray.  Symptoms have stabilized with Symbicort, Singulair. Continue current therapy. Advised him to avoid aspirin and NSAIDs   Left subcostal pain May be musculoskeletal as it is reproducible on palpitation We will check EKG and troponins to ensure no cardiac issue He is arranging follow-up with primary care as well.  Plan/Recommendations: - Continue Symbicort, Singulair. -  Albuterol rescue inhaler - EKG, troponin  Marshell Garfinkel MD [facility] 04/09/2020, 11:43 AM  CC: Laurey Morale, MD

## 2020-04-10 ENCOUNTER — Ambulatory Visit: Payer: BC Managed Care – PPO | Admitting: Family Medicine

## 2020-04-10 ENCOUNTER — Other Ambulatory Visit: Payer: Self-pay

## 2020-04-10 ENCOUNTER — Encounter: Payer: Self-pay | Admitting: Family Medicine

## 2020-04-10 VITALS — BP 120/78 | HR 92 | Temp 98.6°F | Wt 256.0 lb

## 2020-04-10 DIAGNOSIS — D171 Benign lipomatous neoplasm of skin and subcutaneous tissue of trunk: Secondary | ICD-10-CM

## 2020-04-10 NOTE — Progress Notes (Signed)
   Subjective:    Patient ID: Thomas [name], male    DOB: 08-Aug-1960, 60 y.o.   MRN: [id number]  HPI Here for a mildly tender area on the left lower chest that he noticed several days ago. No SOB or cough. No recent trauma. He saw Dr. Vaughan Browner, his pulmonologist, yesterday for an asthma follow up, and he did an EKG and a troponin that were normal. He referred Neill back to see [name]. He has taken Naproxen for this with no benefit.    Review of Systems  Constitutional: Negative.   Respiratory: Negative.   Cardiovascular: Positive for chest pain. Negative for palpitations and leg swelling.  Gastrointestinal: Negative.        Objective:   Physical Exam Constitutional:      Appearance: Normal appearance. He is not ill-appearing.  Cardiovascular:     Rate and Rhythm: Normal rate and regular rhythm.     Pulses: Normal pulses.     Heart sounds: Normal heart sounds.  Pulmonary:     Effort: Pulmonary effort is normal.     Breath sounds: Normal breath sounds.     Comments: The left lower anterior chest has a firm mobile tender mass just under the skin that measures about 8 cm in diameter. The ribs themselves are normal. No rashes or skin changes Neurological:     Mental Status: He is alert.           Assessment & Plan:  This is a lipoma, and I reassured him it is benign. It has become inflamed however so I suggestd he apply ice packs and Voltaren gel to the area for a few days. Recheck as needed.  Alysia Penna, MD

## 2020-04-28 ENCOUNTER — Other Ambulatory Visit: Payer: Self-pay | Admitting: Pulmonary Disease

## 2020-05-12 ENCOUNTER — Other Ambulatory Visit: Payer: Self-pay | Admitting: Otolaryngology

## 2020-05-12 DIAGNOSIS — R221 Localized swelling, mass and lump, neck: Secondary | ICD-10-CM

## 2020-06-02 ENCOUNTER — Other Ambulatory Visit: Payer: Self-pay

## 2020-06-02 ENCOUNTER — Ambulatory Visit
Admission: RE | Admit: 2020-06-02 | Discharge: 2020-06-02 | Disposition: A | Discharge status: Home / Self Care | Payer: BC Managed Care – PPO | Source: Ambulatory Visit | Attending: Otolaryngology | Admitting: Otolaryngology

## 2020-06-02 DIAGNOSIS — R221 Localized swelling, mass and lump, neck: Secondary | ICD-10-CM

## 2020-06-02 MED ORDER — IOPAMIDOL (ISOVUE-300) INJECTION 61%
75.0000 mL | Freq: Once | INTRAVENOUS | Status: AC | PRN
  Administered 2020-06-02: 75 mL via INTRAVENOUS

## 2020-06-09 DIAGNOSIS — M26622 Arthralgia of left temporomandibular joint: Secondary | ICD-10-CM | POA: Insufficient documentation

## 2020-06-29 ENCOUNTER — Other Ambulatory Visit: Payer: Self-pay | Admitting: *Deleted

## 2020-06-29 MED ORDER — BUDESONIDE-FORMOTEROL FUMARATE 160-4.5 MCG/ACT IN AERO: INHALATION_SPRAY | RESPIRATORY_TRACT | 3 refills | Status: DC

## 2020-07-20 ENCOUNTER — Other Ambulatory Visit: Payer: Self-pay

## 2020-07-21 ENCOUNTER — Encounter: Payer: Self-pay | Admitting: Family Medicine

## 2020-07-21 ENCOUNTER — Ambulatory Visit: Payer: BC Managed Care – PPO | Admitting: Family Medicine

## 2020-07-21 VITALS — BP 132/80 | HR 91 | Temp 98.3°F | Wt 260.0 lb

## 2020-07-21 DIAGNOSIS — M79604 Pain in right leg: Secondary | ICD-10-CM | POA: Diagnosis not present

## 2020-07-21 NOTE — Progress Notes (Signed)
   Subjective:    Patient ID: Thomas [name], male    DOB: October 27, 1960, 60 y.o.   MRN: [id number]  HPI Here for 3 weeks of pain in the back of the right leg, starting just above the knee and extending into the calf. He did not have swelling for the first week, but then 2 weeks ago after loading his Lucianne Lei with luggage for a beach trip. The pain became worse and the calf began to swell. He did go on the trip, driving down to the [place] and then coming back home. The leg has continued to bother him since then. No chest pain or SOB. No hx of blood clots. He has been taking Tylenol for this.    Review of Systems  Constitutional: Negative.   Respiratory: Negative.    Cardiovascular:  Positive for leg swelling.  Musculoskeletal:  Positive for myalgias.      Objective:   Physical Exam Constitutional:      General: He is not in acute distress.    Appearance: Normal appearance.  Cardiovascular:     Rate and Rhythm: Normal rate and regular rhythm.     Pulses: Normal pulses.     Heart sounds: Normal heart sounds.  Pulmonary:     Effort: Pulmonary effort is normal.     Breath sounds: Normal breath sounds.  Musculoskeletal:     Comments: He is tender in the proximal right calf just below the knee. No masses or cords felt. No swelling. Bevelyn Buckles is negative.   Neurological:     Mental Status: He is alert.          Assessment & Plan:  This is likely a calf strain. He probably strained it initially at the gym, and then exacerbated it while he was loading his Lucianne Lei. He will rest it, apply warm compresses, and he can take Ibuprofen TID. We will get a venous doppler to rule out a DVT however.  Alysia Penna, MD

## 2020-07-22 ENCOUNTER — Other Ambulatory Visit: Payer: Self-pay | Admitting: Family Medicine

## 2020-07-22 ENCOUNTER — Other Ambulatory Visit: Payer: Self-pay

## 2020-07-22 ENCOUNTER — Ambulatory Visit (HOSPITAL_COMMUNITY)
Admission: RE | Admit: 2020-07-22 | Discharge: 2020-07-22 | Disposition: A | Discharge status: Home / Self Care | Payer: BC Managed Care – PPO | Source: Ambulatory Visit | Attending: Cardiovascular Disease | Admitting: Cardiovascular Disease

## 2020-07-22 DIAGNOSIS — B9689 Other specified bacterial agents as the cause of diseases classified elsewhere: Secondary | ICD-10-CM

## 2020-07-22 DIAGNOSIS — M79604 Pain in right leg: Secondary | ICD-10-CM | POA: Insufficient documentation

## 2020-07-22 NOTE — Progress Notes (Signed)
Reviewed [name] results with pt verbalized understanding

## 2020-07-29 ENCOUNTER — Telehealth: Payer: BC Managed Care – PPO | Admitting: Family Medicine

## 2020-07-29 ENCOUNTER — Encounter: Payer: Self-pay | Admitting: Family Medicine

## 2020-07-29 VITALS — Temp 101.7°F | Wt 260.0 lb

## 2020-07-29 DIAGNOSIS — J40 Bronchitis, not specified as acute or chronic: Secondary | ICD-10-CM

## 2020-07-29 MED ORDER — HYDROCODONE BIT-HOMATROP MBR 5-1.5 MG/5ML PO SOLN: 5.0000 mL | ORAL | 0 refills | Status: DC | PRN

## 2020-07-29 MED ORDER — AZITHROMYCIN 250 MG PO TABS: ORAL_TABLET | ORAL | 0 refills | Status: DC

## 2020-07-29 NOTE — Progress Notes (Signed)
Subjective:    Patient ID: Thomas [name], male    DOB: 07-13-1960, 60 y.o.   MRN: [id number]  HPI Virtual Visit via Video Note  I connected with the patient on 07/29/20 at  3:30 PM EDT by a video enabled telemedicine application and verified that I am speaking with the correct person using two identifiers.  Location patient: home Location provider:work or home office Persons participating in the virtual visit: patient, provider  I discussed the limitations of evaluation and management by telemedicine and the availability of in person appointments. The patient expressed understanding and agreed to proceed.   HPI: Here for one week of chest congestion and coughing up yellow sputum. This has flared his asthma up so has has been using his nebulizer. No fever or chest pain. He tested negative for the Covid virus 2 days ago.    ROS: See pertinent positives and negatives per HPI.  Past Medical History:  Diagnosis Date   Allergy    Asthma    Back pain    Diabetes ([place])    Dyspnea    GERD (gastroesophageal reflux disease)    History of hiatal hernia    Pneumonia    5 -7 yrs ago   Swallowing difficulty     Past Surgical History:  Procedure Laterality Date   ANTERIOR CERVICAL DECOMP/DISCECTOMY FUSION N/A 07/14/2016   Procedure: Cervical five-six5- Cervical six-seven Anterior cervical decompression/discectomy/fusion;  Surgeon: Erline Levine, MD;  Location: [place] [facility];  Service: Neurosurgery;  Laterality: N/A;   COLON SURGERY     COLONOSCOPY  12/22/2014   per Dr. Earlean Shawl, clear, repeat in 5 yrs (hx of adenomas)    DG THUMB LEFT HAND     DG THUMB RIGHT HAND ([place] HX)     FUNCTIONAL ENDOSCOPIC SINUS SURGERY     PARTIAL COLECTOMY     sigmoid, Dr. Earlean Shawl    Family History  Problem Relation Age of Onset   Cancer Mother    Cancer Father    Hyperlipidemia Father    Diabetes Other        maternal family     Current Outpatient Medications:    albuterol (PROAIR HFA) 108 (90 Base)  MCG/ACT inhaler, INHALE 2 PUFFS EVERY 6 HOURS AS NEEDED FOR WHEEZING OR SHORTNESS OF BREATH., Disp: 8 g, Rfl: 1   azithromycin (ZITHROMAX Z-PAK) 250 MG tablet, As directed, Disp: 6 each, Rfl: 0   budesonide-formoterol (SYMBICORT) 160-4.5 MCG/ACT inhaler, TAKE 2 PUFFS BY MOUTH TWICE A DAY, Disp: 30.6 each, Rfl: 3   fluticasone (FLONASE) 50 MCG/ACT nasal spray, SPRAY 2 SPRAYS INTO EACH NOSTRIL EVERY DAY, Disp: 48 mL, Rfl: 3   HYDROcodone bit-homatropine (HYCODAN) 5-1.5 MG/5ML syrup, Take 5 mLs by mouth every 4 (four) hours as needed., Disp: 240 mL, Rfl: 0   ketoconazole (NIZORAL) 2 % cream, Apply 1 application topically 2 (two) times daily as needed for irritation., Disp: 45 g, Rfl: 5   lansoprazole (PREVACID) 15 MG capsule, Take 1 capsule (15 mg total) by mouth daily., Disp: 90 capsule, Rfl: 3   montelukast (SINGULAIR) 10 MG tablet, TAKE 1 TABLET BY MOUTH EVERY DAY, Disp: 90 tablet, Rfl: 3  EXAM:  VITALS per patient if applicable:  GENERAL: alert, oriented, appears well and in no acute distress  HEENT: atraumatic, conjunttiva clear, no obvious abnormalities on inspection of external nose and ears  NECK: normal movements of the head and neck  LUNGS: on inspection no signs of respiratory distress, breathing rate appears normal, no obvious  gross SOB, gasping or wheezing  CV: no obvious cyanosis  MS: moves all visible extremities without noticeable abnormality  PSYCH/NEURO: pleasant and cooperative, no obvious depression or anxiety, speech and thought processing grossly intact  ASSESSMENT AND PLAN: Bronchitis, treat with a Zpack. Recheck as needed.  Alysia Penna, MD  Discussed the following assessment and plan:  No diagnosis found.     I discussed the assessment and treatment plan with the patient. The patient was provided an opportunity to ask questions and all were answered. The patient agreed with the plan and demonstrated an understanding of the instructions.   The patient was  advised to call back or seek an in-person evaluation if the symptoms worsen or if the condition fails to improve as anticipated.      Review of Systems     Objective:   Physical Exam        Assessment & Plan:

## 2020-10-03 ENCOUNTER — Emergency Department (HOSPITAL_BASED_OUTPATIENT_CLINIC_OR_DEPARTMENT_OTHER)
Admission: EM | Admit: 2020-10-03 | Discharge: 2020-10-03 | Disposition: A | Discharge status: Home / Self Care | Payer: BC Managed Care – PPO | Attending: Emergency Medicine | Admitting: Emergency Medicine

## 2020-10-03 ENCOUNTER — Ambulatory Visit: Payer: BC Managed Care – PPO

## 2020-10-03 ENCOUNTER — Other Ambulatory Visit: Payer: Self-pay

## 2020-10-03 ENCOUNTER — Emergency Department (HOSPITAL_BASED_OUTPATIENT_CLINIC_OR_DEPARTMENT_OTHER): Payer: BC Managed Care – PPO

## 2020-10-03 ENCOUNTER — Encounter (HOSPITAL_BASED_OUTPATIENT_CLINIC_OR_DEPARTMENT_OTHER): Payer: Self-pay | Admitting: Emergency Medicine

## 2020-10-03 DIAGNOSIS — R197 Diarrhea, unspecified: Secondary | ICD-10-CM | POA: Insufficient documentation

## 2020-10-03 DIAGNOSIS — E119 Type 2 diabetes mellitus without complications: Secondary | ICD-10-CM | POA: Insufficient documentation

## 2020-10-03 DIAGNOSIS — R1032 Left lower quadrant pain: Secondary | ICD-10-CM | POA: Insufficient documentation

## 2020-10-03 DIAGNOSIS — K219 Gastro-esophageal reflux disease without esophagitis: Secondary | ICD-10-CM | POA: Diagnosis not present

## 2020-10-03 DIAGNOSIS — J45909 Unspecified asthma, uncomplicated: Secondary | ICD-10-CM | POA: Insufficient documentation

## 2020-10-03 DIAGNOSIS — Z87891 Personal history of nicotine dependence: Secondary | ICD-10-CM | POA: Insufficient documentation

## 2020-10-03 DIAGNOSIS — Z7951 Long term (current) use of inhaled steroids: Secondary | ICD-10-CM | POA: Diagnosis not present

## 2020-10-03 DIAGNOSIS — K5792 Diverticulitis of intestine, part unspecified, without perforation or abscess without bleeding: Secondary | ICD-10-CM

## 2020-10-03 LAB — COMPREHENSIVE METABOLIC PANEL
ALT: 27 U/L (ref 0–44)
AST: 15 U/L (ref 15–41)
Albumin: 4.3 g/dL (ref 3.5–5.0)
Alkaline Phosphatase: 80 U/L (ref 38–126)
Anion gap: 10 (ref 5–15)
BUN: 13 mg/dL (ref 8–23)
CO2: 25 mmol/L (ref 22–32)
Calcium: 9.6 mg/dL (ref 8.9–10.3)
Chloride: 100 mmol/L (ref 98–111)
Creatinine, Ser: 1.06 mg/dL (ref 0.61–1.24)
GFR, Estimated: >60 mL/min (ref >60)
Glucose, Bld: 130 mg/dL — ABNORMAL HIGH (ref 70–99)
Potassium: 4.2 mmol/L (ref 3.5–5.1)
Sodium: 135 mmol/L (ref 135–145)
Total Bilirubin: 1 mg/dL (ref 0.3–1.2)
Total Protein: 7.6 g/dL (ref 6.5–8.1)

## 2020-10-03 LAB — CBC
HCT: 41.5 % (ref 39.0–52.0)
Hemoglobin: 14.6 g/dL (ref 13.0–17.0)
MCH: 31.5 pg (ref 26.0–34.0)
MCHC: 35.2 g/dL (ref 30.0–36.0)
MCV: 89.4 fL (ref 80.0–100.0)
Platelets: 306 10*3/uL (ref 150–400)
RBC: 4.64 MIL/uL (ref 4.22–5.81)
RDW: 12.6 % (ref 11.5–15.5)
WBC: 12.8 10*3/uL — ABNORMAL HIGH (ref 4.0–10.5)
nRBC: 0 % (ref 0.0–0.2)

## 2020-10-03 LAB — URINALYSIS, ROUTINE W REFLEX MICROSCOPIC
Bilirubin Urine: NEGATIVE
Glucose, UA: NEGATIVE mg/dL
Hgb urine dipstick: NEGATIVE
Ketones, ur: NEGATIVE mg/dL
Leukocytes,Ua: NEGATIVE
Nitrite: NEGATIVE
Protein, ur: NEGATIVE mg/dL
Specific Gravity, Urine: 1.022 (ref 1.005–1.030)
pH: 7 (ref 5.0–8.0)

## 2020-10-03 LAB — LIPASE, BLOOD: Lipase: <10 U/L — ABNORMAL LOW (ref 11–51)

## 2020-10-03 MED ORDER — METRONIDAZOLE 500 MG PO TABS: 500.0000 mg | ORAL_TABLET | Freq: Two times a day (BID) | ORAL | 0 refills | Status: DC

## 2020-10-03 MED ORDER — ONDANSETRON HCL 4 MG/2ML IJ SOLN
4.0000 mg | Freq: Once | INTRAMUSCULAR | Status: AC
  Administered 2020-10-03: 4 mg via INTRAVENOUS
  Filled 2020-10-03: qty 2

## 2020-10-03 MED ORDER — ONDANSETRON 4 MG PO TBDP
4.0000 mg | ORAL_TABLET | ORAL | 0 refills | Status: DC | PRN
Start: 2020-10-03 — End: 2020-12-08

## 2020-10-03 MED ORDER — LEVOFLOXACIN 750 MG PO TABS: 750.0000 mg | ORAL_TABLET | Freq: Every day | ORAL | 0 refills | Status: DC

## 2020-10-03 MED ORDER — METRONIDAZOLE 500 MG PO TABS
500.0000 mg | ORAL_TABLET | Freq: Once | ORAL | Status: AC
  Administered 2020-10-03: 500 mg via ORAL
  Filled 2020-10-03: qty 1

## 2020-10-03 MED ORDER — HYDROMORPHONE HCL 1 MG/ML IJ SOLN
1.0000 mg | Freq: Once | INTRAMUSCULAR | Status: AC
  Administered 2020-10-03: 1 mg via INTRAVENOUS
  Filled 2020-10-03: qty 1

## 2020-10-03 MED ORDER — METRONIDAZOLE 500 MG PO TABS
500.0000 mg | ORAL_TABLET | Freq: Two times a day (BID) | ORAL | 0 refills | Status: DC
Start: 2020-10-03 — End: 2020-10-19

## 2020-10-03 MED ORDER — SODIUM CHLORIDE 0.9 % IV BOLUS
1000.0000 mL | Freq: Once | INTRAVENOUS | Status: AC
  Administered 2020-10-03: 1000 mL via INTRAVENOUS

## 2020-10-03 MED ORDER — FENTANYL CITRATE PF 50 MCG/ML IJ SOSY
50.0000 ug | PREFILLED_SYRINGE | Freq: Once | INTRAMUSCULAR | Status: AC
  Administered 2020-10-03: 50 ug via INTRAVENOUS
  Filled 2020-10-03: qty 1

## 2020-10-03 MED ORDER — OXYCODONE-ACETAMINOPHEN 5-325 MG PO TABS: 1.0000 | ORAL_TABLET | Freq: Four times a day (QID) | ORAL | 0 refills | Status: DC | PRN

## 2020-10-03 MED ORDER — IOHEXOL 350 MG/ML SOLN
80.0000 mL | Freq: Once | INTRAVENOUS | Status: AC | PRN
  Administered 2020-10-03: 80 mL via INTRAVENOUS

## 2020-10-03 MED ORDER — LEVOFLOXACIN 750 MG PO TABS
750.0000 mg | ORAL_TABLET | Freq: Once | ORAL | Status: AC
  Administered 2020-10-03: 750 mg via ORAL
  Filled 2020-10-03: qty 1

## 2020-10-03 NOTE — ED Provider Notes (Signed)
[facility] EMERGENCY DEPT Provider Note   CSN: [id number] Arrival date & time: 10/03/20  1209     History Chief Complaint  Patient presents with   Abdominal Pain    Thomas [name] is a 60 y.o. male.  Patient is a 60 year old male with a history of diabetes, GERD, sigmoid colectomy secondary to diverticulitis about 10 years ago who presents with abdominal pain.  He has a 3-day history of pain to his left lower abdomen.  He said it started in his suprapubic area but now is in his left lower abdomen.  He says its been worsening over the last couple days.  He denies any nausea or vomiting.  No fevers.  No urinary symptoms.  He had some loose stool last night but that was after taking MiraLAX.  He denies any constipation.  He says he takes MiraLAX from time to time to prevent constipation given his prior colectomy.  He denies any urinary symptoms or hematuria.  No known fevers.  Pain is worse with movement.  He has not taken anything other than the MiraLAX.      Past Medical History:  Diagnosis Date   Allergy    Asthma    Back pain    Diabetes ([place])    Dyspnea    GERD (gastroesophageal reflux disease)    History of hiatal hernia    Pneumonia    5 -7 yrs ago   Swallowing difficulty     Patient Active Problem List   Diagnosis Date Noted   Vitamin D deficiency 04/17/2018   Class 1 obesity with serious comorbidity and body mass index (BMI) of 32.0 to 32.9 in adult 12/21/2017   Type 2 diabetes mellitus without complications ([place]) 0000000   Chronic sinusitis 08/30/2016   Nasal polyposis 08/30/2016   Herniated cervical disc without myelopathy 07/14/2016   Elevated BP 07/27/2015   Meralgia paresthetica of right side 07/13/2015   Reactive airway disease with wheezing 07/13/2015   Obesity (BMI 30-39.9) 09/19/2012   GERD 09/09/2009   ABDOMINAL PAIN 09/09/2009    Past Surgical History:  Procedure Laterality Date   ANTERIOR CERVICAL DECOMP/DISCECTOMY FUSION N/A  07/14/2016   Procedure: Cervical five-six5- Cervical six-seven Anterior cervical decompression/discectomy/fusion;  Surgeon: Erline Levine, MD;  Location: [place];  Service: Neurosurgery;  Laterality: N/A;   COLON SURGERY     COLONOSCOPY  12/22/2014   per Dr. Earlean Shawl, clear, repeat in 5 yrs (hx of adenomas)    DG THUMB LEFT HAND     DG THUMB RIGHT HAND ([place] HX)     FUNCTIONAL ENDOSCOPIC SINUS SURGERY     PARTIAL COLECTOMY     sigmoid, Dr. Earlean Shawl       Family History  Problem Relation Age of Onset   Cancer Mother    Cancer Father    Hyperlipidemia Father    Diabetes Other        maternal family    Social History   Tobacco Use   Smoking status: Former    Packs/day: 1.00    Years: 10.00    Pack years: 10.00    Types: Cigarettes    Quit date: 01/31/1986    Years since quitting: 34.6   Smokeless tobacco: Never  Substance Use Topics   Alcohol use: Yes    Comment: rarely   Drug use: No    Home Medications Prior to Admission medications   Medication Sig Start Date End Date Taking? Authorizing Provider  albuterol (PROAIR HFA) 108 (90 Base) MCG/ACT  inhaler INHALE 2 PUFFS EVERY 6 HOURS AS NEEDED FOR WHEEZING OR SHORTNESS OF BREATH. 05/07/19   Jearld Lesch A, DO  azithromycin (ZITHROMAX Z-PAK) 250 MG tablet As directed 07/29/20   Laurey Morale, MD  budesonide-formoterol (SYMBICORT) 160-4.5 MCG/ACT inhaler TAKE 2 PUFFS BY MOUTH TWICE A DAY 06/29/20   Mannam, Praveen, MD  fluticasone (FLONASE) 50 MCG/ACT nasal spray SPRAY 2 SPRAYS INTO EACH NOSTRIL EVERY DAY 07/22/20   Laurey Morale, MD  HYDROcodone bit-homatropine (HYCODAN) 5-1.5 MG/5ML syrup Take 5 mLs by mouth every 4 (four) hours as needed. 07/29/20   Laurey Morale, MD  ketoconazole (NIZORAL) 2 % cream Apply 1 application topically 2 (two) times daily as needed for irritation. 10/21/19   Laurey Morale, MD  lansoprazole (PREVACID) 15 MG capsule Take 1 capsule (15 mg total) by mouth daily. 07/24/19   Laurey Morale, MD  montelukast  (SINGULAIR) 10 MG tablet TAKE 1 TABLET BY MOUTH EVERY DAY 04/29/20   Mannam, Hart Robinsons, MD    Allergies    No known allergies  Review of Systems   Review of Systems  Constitutional:  Negative for chills, diaphoresis, fatigue and fever.  HENT:  Negative for congestion, rhinorrhea and sneezing.   Eyes: Negative.   Respiratory:  Negative for cough, chest tightness and shortness of breath.   Cardiovascular:  Negative for chest pain and leg swelling.  Gastrointestinal:  Positive for abdominal pain. Negative for blood in stool, diarrhea, nausea and vomiting.  Genitourinary:  Negative for difficulty urinating, flank pain, frequency and hematuria.  Musculoskeletal:  Negative for arthralgias and back pain.  Skin:  Negative for rash.  Neurological:  Negative for dizziness, speech difficulty, weakness, numbness and headaches.   Physical Exam Updated Vital Signs BP (!) 169/89   Pulse 92   Temp 99.1 F (37.3 C) (Oral)   Resp 18   SpO2 99%   Physical Exam Constitutional:      Appearance: He is well-developed.  HENT:     Head: Normocephalic and atraumatic.  Eyes:     Pupils: Pupils are equal, round, and reactive to light.  Cardiovascular:     Rate and Rhythm: Normal rate and regular rhythm.     Heart sounds: Normal heart sounds.  Pulmonary:     Effort: Pulmonary effort is normal. No respiratory distress.     Breath sounds: Normal breath sounds. No wheezing or rales.  Chest:     Chest wall: No tenderness.  Abdominal:     General: Bowel sounds are normal.     Palpations: Abdomen is soft.     Tenderness: There is abdominal tenderness in the left lower quadrant. There is no guarding or rebound.  Genitourinary:    Comments: No pain in the inguinal canals or scrotum/testicles Musculoskeletal:        General: Normal range of motion.     Cervical back: Normal range of motion and neck supple.  Lymphadenopathy:     Cervical: No cervical adenopathy.  Skin:    General: Skin is warm and dry.      Findings: No rash.  Neurological:     Mental Status: He is alert and oriented to person, place, and time.    ED Results / Procedures / Treatments   Labs (all labs ordered are listed, but only abnormal results are displayed) Labs Reviewed  LIPASE, BLOOD - Abnormal; Notable for the following components:      Result Value   Lipase <10 (*)    All other  components within normal limits  COMPREHENSIVE METABOLIC PANEL - Abnormal; Notable for the following components:   Glucose, Bld 130 (*)    All other components within normal limits  CBC - Abnormal; Notable for the following components:   WBC 12.8 (*)    All other components within normal limits  URINALYSIS, ROUTINE W REFLEX MICROSCOPIC    EKG None  Radiology CT Abdomen Pelvis W Contrast  Result Date: 10/03/2020 CLINICAL DATA:  Diverticulitis suspected EXAM: CT ABDOMEN AND PELVIS WITH CONTRAST TECHNIQUE: Multidetector CT imaging of the abdomen and pelvis was performed using the standard protocol following bolus administration of intravenous contrast. CONTRAST:  60m OMNIPAQUE IOHEXOL 350 MG/ML SOLN COMPARISON:  CT 01/27/2007 FINDINGS: Lower chest: Left basilar scarring and atelectasis. Hepatobiliary: No focal liver abnormality is seen. No gallstones, gallbladder wall thickening, or biliary dilatation. Pancreas: Unremarkable. No pancreatic ductal dilatation or surrounding inflammatory changes. Spleen: Normal in size without focal abnormality. Adrenals/Urinary Tract: Adrenal glands are unremarkable. Kidneys are normal, without renal calculi, focal lesion, or hydronephrosis. Bladder is unremarkable. Stomach/Bowel: Small hiatal hernia. No evidence of bowel obstruction. The appendix is normal. Prior distal colon resection and reanastomosis. There is wall thickening and significant inflammatory stranding along the distal descending and proximal sigmoid colon. Vascular/Lymphatic: No significant vascular findings are present. No enlarged abdominal  or pelvic lymph nodes. Reproductive: Unremarkable. Other: No abdominal wall hernia or abnormality. No abdominopelvic ascites. No drainable fluid collection. No pneumoperitoneum. Musculoskeletal: No acute or significant osseous findings. IMPRESSION: Acute uncomplicated diverticulitis of the distal descending/proximal sigmoid colon. No focal/drainable fluid collection. Electronically Signed   By: JMaurine SimmeringM.D.   On: 10/03/2020 15:22    Procedures Procedures   Medications Ordered in ED Medications  fentaNYL (SUBLIMAZE) injection 50 mcg (50 mcg Intravenous Given 10/03/20 1439)  sodium chloride 0.9 % bolus 1,000 mL (1,000 mLs Intravenous New Bag/Given 10/03/20 1437)  ondansetron (ZOFRAN) injection 4 mg (4 mg Intravenous Given 10/03/20 1437)  iohexol (OMNIPAQUE) 350 MG/ML injection 80 mL (80 mLs Intravenous Contrast Given 10/03/20 1450)    ED Course  I have reviewed the triage vital signs and the nursing notes.  Pertinent labs & imaging results that were available during my care of the patient were reviewed by me and considered in my medical decision making (see chart for details).    MDM Rules/Calculators/A&P                           Patient is a 60year old who presents with left-sided abdominal pain.  His labs show an elevated WBC count.  His urinalysis is clear without hematuria or evidence of infection.  He is awaiting CT of his abdomen pelvis.  Dr. PVallery Ridgeto take over. Final Clinical Impression(s) / ED Diagnoses Final diagnoses:  None    Rx / DC Orders ED Discharge Orders     None        BMalvin Johns MD 10/03/20 1525

## 2020-10-03 NOTE — ED Triage Notes (Signed)
3 days abdominal pain, burning, no has moved to left side, was initially whole abdomen. Pt has hx of partial colectomy.

## 2020-10-03 NOTE — Discharge Instructions (Addendum)
1.  Schedule a follow-up appointment with your gastroenterologist as soon as possible this upcoming week. 2.  Return to the emergency department immediately if you have suddenly worsening pain, you are getting fevers, you feel confused or generally weak, you are having vomiting cannot tolerate your medications or other concerning symptoms.

## 2020-10-06 ENCOUNTER — Telehealth: Payer: Self-pay | Admitting: Family Medicine

## 2020-10-06 ENCOUNTER — Ambulatory Visit: Payer: BC Managed Care – PPO | Admitting: Internal Medicine

## 2020-10-06 NOTE — Telephone Encounter (Signed)
Please advise 

## 2020-10-06 NOTE — Telephone Encounter (Signed)
PT has a apt tomorrow at 3:15pm however PT states they have been unable to go since last Friday and would like some advise on what they can take. Please advise as the PT states would like to find out asap.

## 2020-10-07 ENCOUNTER — Ambulatory Visit: Payer: BC Managed Care – PPO | Admitting: Family Medicine

## 2020-10-07 NOTE — Telephone Encounter (Signed)
FYI Spoke with pt state that he was able to get an appointment with his GI doctor who helped get his medications straight.

## 2020-10-07 NOTE — Telephone Encounter (Signed)
I see he was in the ER on 10-03-20. He will need an OV for [name] to help him

## 2020-10-08 NOTE — Telephone Encounter (Signed)
Noted  

## 2020-10-13 ENCOUNTER — Ambulatory Visit (INDEPENDENT_AMBULATORY_CARE_PROVIDER_SITE_OTHER): Payer: BC Managed Care – PPO | Admitting: Bariatrics

## 2020-10-19 ENCOUNTER — Other Ambulatory Visit: Payer: Self-pay

## 2020-10-19 ENCOUNTER — Ambulatory Visit: Payer: BC Managed Care – PPO | Admitting: Family Medicine

## 2020-10-19 ENCOUNTER — Encounter: Payer: Self-pay | Admitting: Family Medicine

## 2020-10-19 VITALS — BP 140/90 | HR 85 | Temp 98.0°F | Wt 254.0 lb

## 2020-10-19 DIAGNOSIS — K5732 Diverticulitis of large intestine without perforation or abscess without bleeding: Secondary | ICD-10-CM | POA: Insufficient documentation

## 2020-10-19 LAB — CBC WITH DIFFERENTIAL/PLATELET
Basophils Absolute: 0.1 10*3/uL (ref 0.0–0.1)
Basophils Relative: 1.5 % (ref 0.0–3.0)
Eosinophils Absolute: 0.3 10*3/uL (ref 0.0–0.7)
Eosinophils Relative: 4.8 % (ref 0.0–5.0)
HCT: 43.8 % (ref 39.0–52.0)
Hemoglobin: 14.8 g/dL (ref 13.0–17.0)
Lymphocytes Relative: 31.2 % (ref 12.0–46.0)
Lymphs Abs: 1.8 10*3/uL (ref 0.7–4.0)
MCHC: 33.7 g/dL (ref 30.0–36.0)
MCV: 90.9 fl (ref 78.0–100.0)
Monocytes Absolute: 0.5 10*3/uL (ref 0.1–1.0)
Monocytes Relative: 8.3 % (ref 3.0–12.0)
Neutro Abs: 3.2 10*3/uL (ref 1.4–7.7)
Neutrophils Relative %: 54.2 % (ref 43.0–77.0)
Platelets: 327 10*3/uL (ref 150.0–400.0)
RBC: 4.82 Mil/uL (ref 4.22–5.81)
RDW: 13.2 % (ref 11.5–15.5)
WBC: 5.9 10*3/uL (ref 4.0–10.5)

## 2020-10-19 MED ORDER — AMOXICILLIN-POT CLAVULANATE 875-125 MG PO TABS: 1.0000 | ORAL_TABLET | Freq: Two times a day (BID) | ORAL | 0 refills | Status: DC

## 2020-10-19 NOTE — Progress Notes (Signed)
   Subjective:    Patient ID: Thomas [name], male    DOB: 1960-10-16, 60 y.o.   MRN: [id number]  HPI Here to follow up on an ER visit on 10-03-20 for diverticulitis. He presented with LLQ pain, and a CT confirmed diverticulitis of the distal descending colon and the proximal sigmoid. His WBC was elevated at 12.8. He was given 7 days of Levaquin and Flagyl, and he improved somewhat. He saw Rolene Course PA at [facility] on 10-06-20, and she gave him a 10 day course of Augmentin. At this point he feels alittle better but her still has some LLQ pain. No fever. His stools are soft (he uses Miralax daily). He is still weak. He has advanced his diet a bit to include mashed potatoes, pancakes, and eggs.    Review of Systems  Constitutional:  Positive for fatigue. Negative for fever.  Respiratory: Negative.    Cardiovascular: Negative.   Gastrointestinal:  Positive for abdominal pain and nausea. Negative for abdominal distention, anal bleeding, blood in stool, constipation, diarrhea and vomiting.      Objective:   Physical Exam Constitutional:      Appearance: Normal appearance.  Cardiovascular:     Rate and Rhythm: Normal rate and regular rhythm.     Pulses: Normal pulses.     Heart sounds: Normal heart sounds.  Pulmonary:     Effort: Pulmonary effort is normal.     Breath sounds: Normal breath sounds.  Abdominal:     General: Abdomen is flat. Bowel sounds are normal. There is no distension.     Palpations: Abdomen is soft. There is no mass.     Tenderness: There is no guarding or rebound.     Hernia: No hernia is present.     Comments: He is moderately tender in the LLQ  Neurological:     Mental Status: He is alert.          Assessment & Plan:  Diverticulitis. He seems to still be harboring some inflammation in the colon. We will treat with another 10 days of Augmentin. Check a CBC and BMET today. Refer to [facility]. Alysia Penna, MD

## 2020-10-19 NOTE — Addendum Note (Signed)
Addended by: Amanda Cockayne on: 10/19/2020 03:16 PM   Modules accepted: Orders

## 2020-10-20 LAB — BASIC METABOLIC PANEL
BUN: 13 mg/dL (ref 6–23)
CO2: 26 mEq/L (ref 19–32)
Calcium: 9.6 mg/dL (ref 8.4–10.5)
Chloride: 102 mEq/L (ref 96–112)
Creatinine, Ser: 1.05 mg/dL (ref 0.40–1.50)
GFR: 76.32 mL/min (ref >60.00)
Glucose, Bld: 116 mg/dL — ABNORMAL HIGH (ref 70–99)
Potassium: 4.5 mEq/L (ref 3.5–5.1)
Sodium: 136 mEq/L (ref 135–145)

## 2020-10-27 ENCOUNTER — Ambulatory Visit (INDEPENDENT_AMBULATORY_CARE_PROVIDER_SITE_OTHER): Payer: BC Managed Care – PPO | Admitting: Bariatrics

## 2020-12-08 ENCOUNTER — Encounter: Payer: Self-pay | Admitting: Pulmonary Disease

## 2020-12-08 ENCOUNTER — Other Ambulatory Visit: Payer: Self-pay

## 2020-12-08 ENCOUNTER — Telehealth: Payer: Self-pay | Admitting: Pulmonary Disease

## 2020-12-08 ENCOUNTER — Ambulatory Visit (INDEPENDENT_AMBULATORY_CARE_PROVIDER_SITE_OTHER): Payer: BC Managed Care – PPO | Admitting: Pulmonary Disease

## 2020-12-08 VITALS — BP 122/74 | HR 74 | Temp 98.0°F | Ht 71.0 in | Wt 254.0 lb

## 2020-12-08 DIAGNOSIS — J4540 Moderate persistent asthma, uncomplicated: Secondary | ICD-10-CM | POA: Diagnosis not present

## 2020-12-08 MED ORDER — ALBUTEROL SULFATE HFA 108 (90 BASE) MCG/ACT IN AERS: INHALATION_SPRAY | RESPIRATORY_TRACT | 5 refills | Status: DC

## 2020-12-08 MED ORDER — PREDNISONE 20 MG PO TABS: 40.0000 mg | ORAL_TABLET | Freq: Every day | ORAL | 0 refills | Status: DC

## 2020-12-08 MED ORDER — AZITHROMYCIN 250 MG PO TABS
ORAL_TABLET | ORAL | 0 refills | Status: DC
Start: 2020-12-08 — End: 2021-02-08

## 2020-12-08 NOTE — Telephone Encounter (Signed)
Plan/Recommendations: - Z-Pak, prednisone - Continue Symbicort, Singulair. - Flonase, OTC antihistamine    I have called the pt and LM on VM

## 2020-12-08 NOTE — Patient Instructions (Signed)
Continue the inhalers as prescribed Will send in a Z-Pak and prednisone 40 mg a day for 5 days Follow-up in 6 months

## 2020-12-08 NOTE — Progress Notes (Signed)
SESAR MADEWELL    [id number]    05-19-1960  Primary Care Physician:Fry, Ishmael Holter, MD  Referring Physician: Laurey Morale, MD [place],  [place] [address]  Chief complaint: Follow up for moderate persistent asthma, nasal polyposis, chronic sinusitis.  HPI: 60 year old with history of allergies, asthma, diabetes, GERD, dyspnea  Follows with Dr. 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   Underwent FEES in April 2019.   He was on aspirin for chronic pain but is not using it anymore.  Advised him to avoid aspirin and NSAIDs   He was originally maintained on Qvar and albuterol with poor control of symptoms. Started on Symbicort and Singulair in Oct 2019 with significant improvement in symptoms  Pets: Used to have a dog, no cats, birds, farm animals Occupation: Works as a Administrator for [employer], Theme park manager Exposures: No known exposures, mold mold, hot tub, Jacuzzi Smoking history: 10-pack-year smoker.  Quit in 1990 Travel history: Drives to [place] as a Administrator.  No other significant travel. Relevant family history: No significant family history of lung disease.  Interim history: Complains of exacerbation of the past week.  Started off with sinus congestion, sinus discharge with yellow/green mucus.  Now has worsened to chest tightness and wheezing with dyspnea Has had fevers had low-grade fevers 101 over the weekend  Continues on Symbicort, Singulair  Atypical chest pain that was noted at last visit has resolved  Outpatient Encounter Medications as of 12/08/2020  Medication Sig   albuterol (PROAIR HFA) 108 (90 Base) MCG/ACT inhaler INHALE 2 PUFFS EVERY 6 HOURS AS NEEDED FOR WHEEZING OR SHORTNESS OF BREATH.   budesonide-formoterol (SYMBICORT) 160-4.5 MCG/ACT inhaler TAKE 2 PUFFS BY MOUTH TWICE A DAY   fluticasone (FLONASE) 50 MCG/ACT nasal spray SPRAY 2 SPRAYS INTO EACH NOSTRIL EVERY DAY   ketoconazole (NIZORAL) 2 % cream  Apply 1 application topically 2 (two) times daily as needed for irritation.   lansoprazole (PREVACID) 15 MG capsule Take 1 capsule (15 mg total) by mouth daily.   montelukast (SINGULAIR) 10 MG tablet TAKE 1 TABLET BY MOUTH EVERY DAY   [DISCONTINUED] amoxicillin-clavulanate (AUGMENTIN) 875-125 MG tablet Take 1 tablet by mouth 2 (two) times daily.   [DISCONTINUED] HYDROcodone-acetaminophen (NORCO/VICODIN) 5-325 MG tablet Take 1 tablet by mouth every 6 (six) hours as needed.   [DISCONTINUED] ondansetron (ZOFRAN ODT) 4 MG disintegrating tablet Take 1 tablet (4 mg total) by mouth every 4 (four) hours as needed for nausea or vomiting.   [DISCONTINUED] oxyCODONE-acetaminophen (PERCOCET) 5-325 MG tablet Take 1-2 tablets by mouth every 6 (six) hours as needed. (Patient not taking: Reported on 10/19/2020)   No facility-administered encounter medications on file as of 12/08/2020.   Physical Exam: Blood pressure 122/74, pulse 74, temperature 98 F (36.7 C), temperature source Oral, height 5\' 11"  (1.803 m), weight 254 lb (115.2 kg), SpO2 97 %. Gen:      No acute distress HEENT:  EOMI, sclera anicteric Neck:     No masses; no thyromegaly Lungs:    Clear to auscultation bilaterally; normal respiratory effort CV:         Regular rate and rhythm; no murmurs Abd:      + bowel sounds; soft, non-tender; no palpable masses, no distension Ext:    No edema; adequate peripheral perfusion Skin:      Warm and dry; no rash Neuro: alert and oriented x 3 Psych: normal mood and affect   Data  Reviewed: Imaging: Chest x-ray 08/12/2017- chronic bronchitic changes, no acute cardiopulmonary abnormalities.  I have reviewed the images personally.  PFTs: 01/04/2018-FVC 5.11 [122%), FEV1 4.38 [133%), F/F 86, TLC 66%, TLC 114% Mild restriction  FENO 11/27/17- 217 ACQ6 11/27/17 - 3 ACT score 01/03/2019-21  Labs: CBC 10/23/2017-WBC 8.2, eos 6%, absolute eosinophil count 492 IgE 11/19/2017-229  Assessment:  Moderate  persistent asthma Acute visit for sinusitis, asthma exacerbation His asthma is associated with nasal polyposis, chronic sinusitis, possible aspirin allergy, peripheral eosinophilia.  PFTs show mild restriction but there is no evidence of interstitial lung disease on chest x-ray.  Has exacerbation today precipitated by sinusitis We will call in a Z-Pak and prednisone 40 mg a day for 5 days Continue Symbicort, Singulair Continue Flonase.  Use over-the-counter antihistamines  Plan/Recommendations: - Z-Pak, prednisone - Continue Symbicort, Singulair. - Flonase, OTC antihistamine  Marshell Garfinkel MD [facility] 12/08/2020, 9:16 AM  CC: Laurey Morale, MD

## 2020-12-22 ENCOUNTER — Ambulatory Visit: Payer: BC Managed Care – PPO | Admitting: Family Medicine

## 2020-12-22 MED ORDER — PREDNISONE 20 MG PO TABS
40.0000 mg | ORAL_TABLET | Freq: Every day | ORAL | 0 refills | Status: DC
Start: 2020-12-22 — End: 2020-12-29

## 2020-12-22 NOTE — Telephone Encounter (Signed)
ATC LVMTCB x 1  

## 2020-12-22 NOTE — Telephone Encounter (Signed)
Patient is returning phone call. Patient phone number is [phone].

## 2020-12-22 NOTE — Telephone Encounter (Signed)
He will not need more antibiotics. Please call in more prednisone 40mg /day for 5 days

## 2020-12-22 NOTE — Telephone Encounter (Signed)
Called and spoke to pt. Pt states he spoke with the pharmacy regarding the medication.   Pt did say he is still having a productive cough. Pt states overall he feels improved after zpak and pred. However, he still has a productive cough throughout the day that worsens at night. The mucus is clear and foamy per pt. Pt denies CP/tightness and f/c/s. Pt states he is using his albuterol daily now but he is more active as well and at times uses the albuterol as a prophylaxis. Pt has an OV with his PCP on 11/29 and will discuss this with his PCP at that time as well. Pt wanted Dr. Vaughan Browner to be aware since his OV isnt for another 6 months with pulm.    Dr. Vaughan Browner, please advise. Thanks!

## 2020-12-22 NOTE — Telephone Encounter (Signed)
LMTCB

## 2020-12-22 NOTE — Telephone Encounter (Signed)
I called and spoke with the pt and notified of response per Dr Vaughan Browner. Rx was sent to preferred pharm. Nothing further needed.

## 2020-12-29 ENCOUNTER — Ambulatory Visit (INDEPENDENT_AMBULATORY_CARE_PROVIDER_SITE_OTHER): Payer: BC Managed Care – PPO | Admitting: Family Medicine

## 2020-12-29 ENCOUNTER — Encounter: Payer: Self-pay | Admitting: Family Medicine

## 2020-12-29 VITALS — BP 132/80 | HR 87 | Temp 98.6°F | Wt 257.0 lb

## 2020-12-29 DIAGNOSIS — J329 Chronic sinusitis, unspecified: Secondary | ICD-10-CM | POA: Diagnosis not present

## 2020-12-29 DIAGNOSIS — Z23 Encounter for immunization: Secondary | ICD-10-CM | POA: Diagnosis not present

## 2020-12-29 DIAGNOSIS — J45909 Unspecified asthma, uncomplicated: Secondary | ICD-10-CM | POA: Insufficient documentation

## 2020-12-29 DIAGNOSIS — J4540 Moderate persistent asthma, uncomplicated: Secondary | ICD-10-CM | POA: Diagnosis not present

## 2020-12-29 NOTE — Progress Notes (Signed)
   Subjective:    Patient ID: Thomas [name], male    DOB: [birth year], 60 y.o.   MRN: [id number]  HPI Here to follow up on asthma and a recent sinus infection. He saw Dr. Vaughan Browner on 12-08-20 and he gave him a Zpack and Prednisone. He then saw Dr. Wilburn Cornelia on 12-17-20 and he seemed to be doing well. Today he feels fine although he still has an occasional cough.    Review of Systems  Constitutional: Negative.   Respiratory:  Positive for cough. Negative for chest tightness, shortness of breath and wheezing.   Cardiovascular: Negative.       Objective:   Physical Exam Constitutional:      Appearance: Normal appearance. He is not ill-appearing.  Cardiovascular:     Rate and Rhythm: Normal rate and regular rhythm.     Pulses: Normal pulses.     Heart sounds: Normal heart sounds.  Pulmonary:     Effort: Pulmonary effort is normal.     Breath sounds: Normal breath sounds.  Neurological:     Mental Status: He is alert.          Assessment & Plan:  His sinusitis has resolved. His asthma is stable. He is given a flu shot today.  Alysia Penna, MD

## 2020-12-29 NOTE — Addendum Note (Signed)
Addended by: Wyvonne Lenz on: 12/29/2020 03:58 PM   Modules accepted: Orders

## 2021-02-03 ENCOUNTER — Ambulatory Visit: Payer: BC Managed Care – PPO | Admitting: Family Medicine

## 2021-02-08 ENCOUNTER — Encounter: Payer: Self-pay | Admitting: Family Medicine

## 2021-02-08 ENCOUNTER — Telehealth (INDEPENDENT_AMBULATORY_CARE_PROVIDER_SITE_OTHER): Payer: BC Managed Care – PPO | Admitting: Family Medicine

## 2021-02-08 VITALS — Temp 99.6°F

## 2021-02-08 DIAGNOSIS — J019 Acute sinusitis, unspecified: Secondary | ICD-10-CM

## 2021-02-08 MED ORDER — AMOXICILLIN-POT CLAVULANATE 875-125 MG PO TABS: 1.0000 | ORAL_TABLET | Freq: Two times a day (BID) | ORAL | 0 refills | Status: DC

## 2021-02-08 NOTE — Progress Notes (Signed)
Subjective:    Patient ID: Thomas [name], male    DOB: March 20, 1960, 61 y.o.   MRN: [id number]  HPI Virtual Visit via Video Note  I connected with the patient on 02/08/21 at  1:00 PM EST by a video enabled telemedicine application and verified that I am speaking with the correct person using two identifiers.  Location patient: home Location provider:work or home office Persons participating in the virtual visit: patient, provider  I discussed the limitations of evaluation and management by telemedicine and the availability of in person appointments. The patient expressed understanding and agreed to proceed.   HPI: Here for one week of fever to 99.6 degrees, sinus pressure, PND, and blowing yellow mucus from the nose. Taking Mucinex. He tested negative for the Covid-19 virus last night.    ROS: See pertinent positives and negatives per HPI.  Past Medical History:  Diagnosis Date   Allergy    Asthma    Back pain    Diabetes ([place])    Dyspnea    GERD (gastroesophageal reflux disease)    History of hiatal hernia    Pneumonia    5 -7 yrs ago   Swallowing difficulty     Past Surgical History:  Procedure Laterality Date   ANTERIOR CERVICAL DECOMP/DISCECTOMY FUSION N/A 07/14/2016   Procedure: Cervical five-six5- Cervical six-seven Anterior cervical decompression/discectomy/fusion;  Surgeon: Erline Levine, MD;  Location: [facility];  Service: Neurosurgery;  Laterality: N/A;   COLON SURGERY     COLONOSCOPY  12/22/2014   per Dr. Earlean Shawl, clear, repeat in 5 yrs (hx of adenomas)    DG THUMB LEFT HAND     DG THUMB RIGHT HAND ([place] HX)     FUNCTIONAL ENDOSCOPIC SINUS SURGERY     PARTIAL COLECTOMY     sigmoid, Dr. Earlean Shawl    Family History  Problem Relation Age of Onset   Cancer Mother    Cancer Father    Hyperlipidemia Father    Diabetes Other        maternal family     Current Outpatient Medications:    albuterol (PROAIR HFA) 108 (90 Base) MCG/ACT inhaler, INHALE 2 PUFFS EVERY 6  HOURS AS NEEDED FOR WHEEZING OR SHORTNESS OF BREATH., Disp: 8 g, Rfl: 5   amoxicillin-clavulanate (AUGMENTIN) 875-125 MG tablet, Take 1 tablet by mouth 2 (two) times daily., Disp: 20 tablet, Rfl: 0   budesonide-formoterol (SYMBICORT) 160-4.5 MCG/ACT inhaler, TAKE 2 PUFFS BY MOUTH TWICE A DAY, Disp: 30.6 each, Rfl: 3   fluticasone (FLONASE) 50 MCG/ACT nasal spray, SPRAY 2 SPRAYS INTO EACH NOSTRIL EVERY DAY, Disp: 48 mL, Rfl: 3   ketoconazole (NIZORAL) 2 % cream, Apply 1 application topically 2 (two) times daily as needed for irritation., Disp: 45 g, Rfl: 5   lansoprazole (PREVACID) 15 MG capsule, Take 1 capsule (15 mg total) by mouth daily., Disp: 90 capsule, Rfl: 3   montelukast (SINGULAIR) 10 MG tablet, TAKE 1 TABLET BY MOUTH EVERY DAY, Disp: 90 tablet, Rfl: 3  EXAM:  VITALS per patient if applicable:  GENERAL: alert, oriented, appears well and in no acute distress  HEENT: atraumatic, conjunttiva clear, no obvious abnormalities on inspection of external nose and ears  NECK: normal movements of the head and neck  LUNGS: on inspection no signs of respiratory distress, breathing rate appears normal, no obvious gross SOB, gasping or wheezing  CV: no obvious cyanosis  MS: moves all visible extremities without noticeable abnormality  PSYCH/NEURO: pleasant and cooperative, no obvious depression or  anxiety, speech and thought processing grossly intact  ASSESSMENT AND PLAN: Sinusitis, treat with Augmentin. Alysia Penna, MD  Discussed the following assessment and plan:  No diagnosis found.     I discussed the assessment and treatment plan with the patient. The patient was provided an opportunity to ask questions and all were answered. The patient agreed with the plan and demonstrated an understanding of the instructions.   The patient was advised to call back or seek an in-person evaluation if the symptoms worsen or if the condition fails to improve as anticipated.      Review of  Systems     Objective:   Physical Exam        Assessment & Plan:

## 2021-02-24 ENCOUNTER — Encounter: Payer: Self-pay | Admitting: Family Medicine

## 2021-02-24 ENCOUNTER — Ambulatory Visit (INDEPENDENT_AMBULATORY_CARE_PROVIDER_SITE_OTHER): Payer: BC Managed Care – PPO | Admitting: Family Medicine

## 2021-02-24 VITALS — BP 130/80 | HR 83 | Temp 99.0°F | Ht 71.0 in | Wt 251.0 lb

## 2021-02-24 DIAGNOSIS — Z0000 Encounter for general adult medical examination without abnormal findings: Secondary | ICD-10-CM | POA: Diagnosis not present

## 2021-02-24 LAB — BASIC METABOLIC PANEL WITH GFR
BUN: 14 mg/dL (ref 6–23)
CO2: 28 meq/L (ref 19–32)
Calcium: 9.6 mg/dL (ref 8.4–10.5)
Chloride: 103 meq/L (ref 96–112)
Creatinine, Ser: 1.12 mg/dL (ref 0.40–1.50)
GFR: 70.46 mL/min (ref >60.00)
Glucose, Bld: 115 mg/dL — ABNORMAL HIGH (ref 70–99)
Potassium: 4.2 meq/L (ref 3.5–5.1)
Sodium: 139 meq/L (ref 135–145)

## 2021-02-24 LAB — CBC WITH DIFFERENTIAL/PLATELET
Basophils Absolute: 0.1 10*3/uL (ref 0.0–0.1)
Basophils Relative: 2 % (ref 0.0–3.0)
Eosinophils Absolute: 0.3 10*3/uL (ref 0.0–0.7)
Eosinophils Relative: 5.7 % — ABNORMAL HIGH (ref 0.0–5.0)
HCT: 44 % (ref 39.0–52.0)
Hemoglobin: 14.8 g/dL (ref 13.0–17.0)
Lymphocytes Relative: 34.9 % (ref 12.0–46.0)
Lymphs Abs: 2.1 10*3/uL (ref 0.7–4.0)
MCHC: 33.7 g/dL (ref 30.0–36.0)
MCV: 91.1 fl (ref 78.0–100.0)
Monocytes Absolute: 0.5 10*3/uL (ref 0.1–1.0)
Monocytes Relative: 7.9 % (ref 3.0–12.0)
Neutro Abs: 3 10*3/uL (ref 1.4–7.7)
Neutrophils Relative %: 49.5 % (ref 43.0–77.0)
Platelets: 326 10*3/uL (ref 150.0–400.0)
RBC: 4.83 Mil/uL (ref 4.22–5.81)
RDW: 13.4 % (ref 11.5–15.5)
WBC: 6 10*3/uL (ref 4.0–10.5)

## 2021-02-24 LAB — HEPATIC FUNCTION PANEL
ALT: 34 U/L (ref 0–53)
AST: 24 U/L (ref 0–37)
Albumin: 4.5 g/dL (ref 3.5–5.2)
Alkaline Phosphatase: 85 U/L (ref 39–117)
Bilirubin, Direct: 0.1 mg/dL (ref 0.0–0.3)
Total Bilirubin: 0.6 mg/dL (ref 0.2–1.2)
Total Protein: 6.9 g/dL (ref 6.0–8.3)

## 2021-02-24 LAB — TSH: TSH: 1.45 u[IU]/mL (ref 0.35–5.50)

## 2021-02-24 LAB — LIPID PANEL
Cholesterol: 176 mg/dL (ref 0–200)
HDL: 34.4 mg/dL — ABNORMAL LOW (ref >39.00)
LDL Cholesterol: 122 mg/dL — ABNORMAL HIGH (ref 0–99)
NonHDL: 141.43
Total CHOL/HDL Ratio: 5
Triglycerides: 96 mg/dL (ref 0.0–149.0)
VLDL: 19.2 mg/dL (ref 0.0–40.0)

## 2021-02-24 LAB — HEMOGLOBIN A1C: Hgb A1c MFr Bld: 6.5 % (ref 4.6–6.5)

## 2021-02-24 LAB — PSA: PSA: 0.95 ng/mL (ref 0.10–4.00)

## 2021-02-24 NOTE — Progress Notes (Signed)
° °  Subjective:    Patient ID: Thomas [name], male    DOB: 28-Feb-1960, 61 y.o.   MRN: [id number]  HPI Here for a well exam. He feels great.    Review of Systems  Constitutional: Negative.   HENT: Negative.    Eyes: Negative.   Respiratory: Negative.    Cardiovascular: Negative.   Gastrointestinal: Negative.   Genitourinary: Negative.   Musculoskeletal: Negative.   Skin: Negative.   Neurological: Negative.   Psychiatric/Behavioral: Negative.        Objective:   Physical Exam Constitutional:      General: He is not in acute distress.    Appearance: Normal appearance. He is well-developed. He is not diaphoretic.  HENT:     Head: Normocephalic and atraumatic.     Right Ear: External ear normal.     Left Ear: External ear normal.     Nose: Nose normal.     Mouth/Throat:     Pharynx: No oropharyngeal exudate.  Eyes:     General: No scleral icterus.       Right eye: No discharge.        Left eye: No discharge.     Conjunctiva/sclera: Conjunctivae normal.     Pupils: Pupils are equal, round, and reactive to light.  Neck:     Thyroid: No thyromegaly.     Vascular: No JVD.     Trachea: No tracheal deviation.  Cardiovascular:     Rate and Rhythm: Normal rate and regular rhythm.     Heart sounds: Normal heart sounds. No murmur heard.   No friction rub. No gallop.  Pulmonary:     Effort: Pulmonary effort is normal. No respiratory distress.     Breath sounds: Normal breath sounds. No wheezing or rales.  Chest:     Chest wall: No tenderness.  Abdominal:     General: Bowel sounds are normal. There is no distension.     Palpations: Abdomen is soft. There is no mass.     Tenderness: There is no abdominal tenderness. There is no guarding or rebound.  Genitourinary:    Penis: Normal. No tenderness.      Testes: Normal.     Prostate: Normal.     Rectum: Normal. Guaiac result negative.  Musculoskeletal:        General: No tenderness. Normal range of motion.     Cervical back:  Neck supple.  Lymphadenopathy:     Cervical: No cervical adenopathy.  Skin:    General: Skin is warm and dry.     Coloration: Skin is not pale.     Findings: No erythema or rash.  Neurological:     Mental Status: He is alert and oriented to person, place, and time.     Cranial Nerves: No cranial nerve deficit.     Motor: No abnormal muscle tone.     Coordination: Coordination normal.     Deep Tendon Reflexes: Reflexes are normal and symmetric. Reflexes normal.  Psychiatric:        Behavior: Behavior normal.        Thought Content: Thought content normal.        Judgment: Judgment normal.          Assessment & Plan:  Well exam. We discussed diet and exercise. Get fasting labs. Alysia Penna, MD

## 2021-03-11 ENCOUNTER — Telehealth: Payer: Self-pay | Admitting: Family Medicine

## 2021-03-11 NOTE — Telephone Encounter (Signed)
Pt called about seeing  dr Earlean Shawl for colonoscopy. Pt is long term pt of dr Earlean Shawl and calling to see if he needs a referral to see dr Earlean Shawl. I called and spoke with ellen and she will get with referral coordinator to see if pt needs a referral to see md again. Pt last colonoscopy was June 2020. Dorian Pod was also see if pt needs colonoscopy in 3 or 5 yrs and will call pt back. Pt is aware

## 2021-03-11 NOTE — Telephone Encounter (Addendum)
Per ellen pt is not due for colonoscopy until June  2025 . Pt is aware and will call them due to he had diverticulitis in sept 2022

## 2021-03-30 ENCOUNTER — Other Ambulatory Visit: Payer: Self-pay

## 2021-03-30 ENCOUNTER — Other Ambulatory Visit: Payer: Self-pay | Admitting: Pulmonary Disease

## 2021-03-30 ENCOUNTER — Telehealth: Payer: Self-pay | Admitting: Family Medicine

## 2021-03-30 DIAGNOSIS — J4540 Moderate persistent asthma, uncomplicated: Secondary | ICD-10-CM

## 2021-03-30 MED ORDER — ALBUTEROL SULFATE HFA 108 (90 BASE) MCG/ACT IN AERS: INHALATION_SPRAY | RESPIRATORY_TRACT | 1 refills | Status: DC

## 2021-03-30 MED ORDER — ALBUTEROL SULFATE (2.5 MG/3ML) 0.083% IN NEBU: 2.5000 mg | INHALATION_SOLUTION | RESPIRATORY_TRACT | 12 refills | Status: DC | PRN

## 2021-03-30 MED ORDER — ALBUTEROL SULFATE HFA 108 (90 BASE) MCG/ACT IN AERS: INHALATION_SPRAY | RESPIRATORY_TRACT | 5 refills | Status: DC

## 2021-03-30 NOTE — Telephone Encounter (Signed)
Pt is calling and would like a refill albuterol sulfate solution . Pt said it has been over 2 yrs since he had medication and [facility] and would like med to be sent to  [facility], [place] Phone:  [phone]  Fax:  [phone]

## 2021-03-30 NOTE — Telephone Encounter (Addendum)
Patient needs refill on Albuterol Solution .    Medication discontinued off med list.. Pharmacy updated

## 2021-03-30 NOTE — Telephone Encounter (Signed)
I sent it to [facility]

## 2021-03-30 NOTE — Addendum Note (Signed)
Addended by: Alysia Penna A on: 03/30/2021 04:20 PM   Modules accepted: Orders

## 2021-03-30 NOTE — Telephone Encounter (Signed)
Patient aware.

## 2021-04-06 DIAGNOSIS — Z0289 Encounter for other administrative examinations: Secondary | ICD-10-CM

## 2021-04-14 ENCOUNTER — Ambulatory Visit (INDEPENDENT_AMBULATORY_CARE_PROVIDER_SITE_OTHER): Payer: BC Managed Care – PPO | Admitting: Bariatrics

## 2021-04-14 ENCOUNTER — Encounter (INDEPENDENT_AMBULATORY_CARE_PROVIDER_SITE_OTHER): Payer: Self-pay | Admitting: Bariatrics

## 2021-04-14 ENCOUNTER — Other Ambulatory Visit: Payer: Self-pay

## 2021-04-14 VITALS — BP 136/73 | HR 93 | Temp 98.3°F | Ht 70.0 in | Wt 249.0 lb

## 2021-04-14 DIAGNOSIS — E1169 Type 2 diabetes mellitus with other specified complication: Secondary | ICD-10-CM

## 2021-04-14 DIAGNOSIS — E668 Other obesity: Secondary | ICD-10-CM

## 2021-04-14 DIAGNOSIS — Z6835 Body mass index (BMI) 35.0-35.9, adult: Secondary | ICD-10-CM

## 2021-04-14 DIAGNOSIS — R0602 Shortness of breath: Secondary | ICD-10-CM

## 2021-04-14 DIAGNOSIS — E559 Vitamin D deficiency, unspecified: Secondary | ICD-10-CM | POA: Diagnosis not present

## 2021-04-14 DIAGNOSIS — K219 Gastro-esophageal reflux disease without esophagitis: Secondary | ICD-10-CM

## 2021-04-14 DIAGNOSIS — E119 Type 2 diabetes mellitus without complications: Secondary | ICD-10-CM

## 2021-04-14 DIAGNOSIS — R5383 Other fatigue: Secondary | ICD-10-CM

## 2021-04-14 DIAGNOSIS — Z1331 Encounter for screening for depression: Secondary | ICD-10-CM | POA: Diagnosis not present

## 2021-04-15 LAB — INSULIN, RANDOM: INSULIN: 18 u[IU]/mL (ref 2.6–24.9)

## 2021-04-15 LAB — VITAMIN D 25 HYDROXY (VIT D DEFICIENCY, FRACTURES): Vit D, 25-Hydroxy: 43.6 ng/mL (ref 30.0–100.0)

## 2021-04-19 NOTE — Progress Notes (Addendum)
? ? ? ?Chief Complaint:  ? ?OBESITY ?Thomas [name] (MR# [id number]) is a 61 y.o. male who presents for evaluation and treatment of obesity and related comorbidities. Current BMI is Body mass index is 35.73 kg/m?Thomas [name] has been struggling with his weight for many years and has been unsuccessful in either losing weight, maintaining weight loss, or reaching his healthy weight goal. ? ?Thomas [name] is a returning patient with his last visit by 06/26/2019. He states that he does like to cook. He wants to lose about 30-35 lbs.  ? ?Thomas [name] is currently in the action stage of change and ready to dedicate time achieving and maintaining a healthier weight. Thomas [name] is interested in becoming our patient and working on intensive lifestyle modifications including (but not limited to) diet and exercise for weight loss. ? ?Thomas [name]'s habits were reviewed today and are as follows: His family eats meals together, he thinks his family will eat healthier with him, his desired weight loss is 29 pounds, he started gaining weight after career change, his heaviest weight ever was 264 pounds, he is a picky eater but does enjoy  healthier foods, he has some food cravings issues, he snacks frequently in the evenings, he skips meals frequently, he frequently eats larger portions than normal, and he struggles with emotional eating. ? ?Depression Screen ?Thomas [name]'s Food and Mood (modified PHQ-9) score was 7. ? ?Depression screen [facility] 2/9 04/14/2021  ?Decreased Interest 1  ?Down, Depressed, Hopeless 2  ?PHQ - 2 Score 3  ?Altered sleeping 1  ?Tired, decreased energy 2  ?Change in appetite 1  ?Feeling bad or failure about yourself  0  ?Trouble concentrating 0  ?Moving slowly or fidgety/restless 0  ?Suicidal thoughts 0  ?PHQ-9 Score 7  ?Difficult doing work/chores Not difficult at all  ? ?Subjective:  ? ?1. Other fatigue ?Thomas [name]'s IC today was 2693. His RMR 02/20/2019 was 3231.Thomas [name] admits to daytime somnolence and admits to waking up still tired.  Patient has a history of symptoms of morning fatigue. [name] generally gets 5 or 6 hours of sleep per night, and states that he has difficulty falling asleep. Snoring is present. Apneic episodes are not present. Epworth Sleepiness Score is 4.   ? ?2. SOB (shortness of breath) on exertion ?Thomas [name]'s IC today was 2693. His RMR 02/20/2019 wa [id number].Thomas [name] notes increasing shortness of breath with exercising and seems to be worsening over time with weight gain. He notes getting out of breath sooner with activity than he used to. This has not gotten worse recently. [name] denies shortness of breath at rest or orthopnea.  ? ?3. Type 2 diabetes mellitus without complication, without long-term current use of insulin ([name]) ?Draco is currently not on medications. His last A1C was 6.5. ? ?4. Vitamin D deficiency ?[name] is not on medications currently.  ? ?5. Gastroesophageal reflux disease without esophagitis ?[name] is currently not on medications.  ? ?Assessment/Plan:  ? ?1. Other fatigue ?[name] will gradually increase activities and exercise. [name] does feel that his weight is causing his energy to be lower than it should be. Fatigue may be related to obesity, depression or many other causes. Labs will be ordered, and in the meanwhile, Khylon will focus on self care including making healthy food choices, increasing physical activity and focusing on stress reduction.  ? ?2. SOB (shortness of breath) on exertion ?Demonte will gradually increase activities and exercise. Thomas [name] does feel that he gets out of breath more easily that he used to when he exercises. Thomas [name]'s shortness of  breath appears to be obesity related and exercise induced. He has agreed to work on weight loss and gradually increase exercise to treat his exercise induced shortness of breath. Will continue to monitor closely.  ? ?3. Type 2 diabetes mellitus without complication, without long-term current use of insulin (Thomas [name]) ?We will check insulin  and microalbuminuria today.Good blood sugar control is important to decrease the likelihood of diabetic complications such as nephropathy, neuropathy, limb loss, blindness, coronary artery disease, and death. Intensive lifestyle modification including diet, exercise and weight loss are the first line of treatment for diabetes.  ? ?- Insulin, random ? ?4. Vitamin D deficiency ? ?He has had vitamin D deficiency in the past.  ?Low Vitamin D level contributes to fatigue and are associated with obesity, breast, and colon cancer. We will check Vitamin D today and Thomas [name] will follow-up for routine testing of Vitamin D, at least 2-3 times per year to avoid over-replacement. ? ?- VITAMIN D 25 Hydroxy (Vit-D Deficiency, Fractures) ? ?5. Gastroesophageal reflux disease without esophagitis ?Intensive lifestyle modifications are the first line treatment for this issue. Thomas [name] will watch his triggers. We discussed several lifestyle modifications today and he will continue to work on diet, exercise and weight loss efforts. Orders and follow up as documented in patient record.  ? ?Counseling ?If a person has gastroesophageal reflux disease (GERD), food and stomach acid move back up into the esophagus and cause symptoms or problems such as damage to the esophagus. ?Anti-reflux measures include: raising the head of the bed, avoiding tight clothing or belts, avoiding eating late at night, not lying down shortly after mealtime, and achieving weight loss. ?Avoid ASA, NSAID's, caffeine, alcohol, and tobacco.  ?OTC Pepcid and/or Tums are often very helpful for as needed use.  ?However, for persisting chronic or daily symptoms, stronger medications like Omeprazole may be needed. ?You may need to avoid foods and drinks such as: ?Coffee and tea (with or without caffeine). ?Drinks that contain alcohol. ?Energy drinks and sports drinks. ?Bubbly (carbonated) drinks or sodas. ?Chocolate and cocoa. ?Peppermint and mint flavorings. ?Garlic and  onions. ?Horseradish. ?Spicy and acidic foods. These include peppers, chili powder, curry powder, vinegar, hot sauces, and BBQ sauce. ?Citrus fruit juices and citrus fruits, such as oranges, lemons, and limes. ?Tomato-based foods. These include red sauce, chili, salsa, and pizza with red sauce. ?Fried and fatty foods. These include donuts, french fries, potato chips, and high-fat dressings. ?High-fat meats. These include hot dogs, rib eye steak, sausage, ham, and bacon.  ? ?6. Depression screen ?Ayron had a slightly positive depression screening. He denies depression at this time.  ? ?7. Class 2 severe obesity with serious comorbidity and body mass index (BMI) of 35.0 to 35.9 in adult, unspecified obesity type ([place]) ?Obinna is currently in the action stage of change and his goal is to continue with weight loss efforts. I recommend Derald begin the structured treatment plan as follows: ? ?He has agreed to the Category 4 Plan. ? ?Yitzchak will continue meal planning. He will adhere closely to the plan. She will have no sugary beverages. We reviewed labs from 02/24/2021 CMP, Lipids, A1C, and TSH. ? ?Exercise goals: No exercise has been prescribed at this time.  ? ?Behavioral modification strategies: increasing lean protein intake, decreasing simple carbohydrates, increasing vegetables, increasing water intake, decreasing eating out, no skipping meals, meal planning and cooking strategies, keeping healthy foods in the home, and planning for success. ? ?He was informed of the importance of frequent follow-up visits to  maximize his success with intensive lifestyle modifications for his multiple health conditions. He was informed we would discuss his lab results at his next visit unless there is a critical issue that needs to be addressed sooner. Taheem agreed to keep his next visit at the agreed upon time to discuss these results. ? ?Objective:  ? ?Blood pressure 136/73, pulse 93, temperature 98.3 ?F (36.8 ?C), height  '5\' 10"'$  (1.778 m), weight 249 lb (112.9 kg), SpO2 96 %. Body mass index is 35.73 kg/m?. ? ?EKG: Normal sinus rhythm, rate 76 bpm. ? ?Indirect Calorimeter completed today shows a VO2 of 389 and a REE of 26

## 2021-04-20 ENCOUNTER — Encounter (INDEPENDENT_AMBULATORY_CARE_PROVIDER_SITE_OTHER): Payer: Self-pay | Admitting: Bariatrics

## 2021-04-28 ENCOUNTER — Ambulatory Visit (INDEPENDENT_AMBULATORY_CARE_PROVIDER_SITE_OTHER): Payer: BC Managed Care – PPO | Admitting: Bariatrics

## 2021-04-28 ENCOUNTER — Encounter (INDEPENDENT_AMBULATORY_CARE_PROVIDER_SITE_OTHER): Payer: Self-pay | Admitting: Bariatrics

## 2021-04-28 VITALS — BP 149/77 | HR 76 | Temp 98.1°F | Ht 70.0 in | Wt 249.0 lb

## 2021-04-28 DIAGNOSIS — Z6835 Body mass index (BMI) 35.0-35.9, adult: Secondary | ICD-10-CM

## 2021-04-28 DIAGNOSIS — E559 Vitamin D deficiency, unspecified: Secondary | ICD-10-CM

## 2021-04-28 DIAGNOSIS — E119 Type 2 diabetes mellitus without complications: Secondary | ICD-10-CM

## 2021-04-28 DIAGNOSIS — E1169 Type 2 diabetes mellitus with other specified complication: Secondary | ICD-10-CM

## 2021-04-28 DIAGNOSIS — K219 Gastro-esophageal reflux disease without esophagitis: Secondary | ICD-10-CM | POA: Diagnosis not present

## 2021-04-28 DIAGNOSIS — E669 Obesity, unspecified: Secondary | ICD-10-CM | POA: Diagnosis not present

## 2021-04-28 MED ORDER — ONDANSETRON HCL 4 MG PO TABS: 4.0000 mg | ORAL_TABLET | Freq: Three times a day (TID) | ORAL | 0 refills | Status: DC | PRN

## 2021-04-28 MED ORDER — OZEMPIC (0.25 OR 0.5 MG/DOSE) 2 MG/1.5ML ~~LOC~~ SOPN: 0.2500 mg | PEN_INJECTOR | SUBCUTANEOUS | 0 refills | Status: DC

## 2021-04-28 MED ORDER — VITAMIN D (ERGOCALCIFEROL) 1.25 MG (50000 UNIT) PO CAPS: 50000.0000 [IU] | ORAL_CAPSULE | ORAL | 0 refills | Status: DC

## 2021-04-29 ENCOUNTER — Encounter (INDEPENDENT_AMBULATORY_CARE_PROVIDER_SITE_OTHER): Payer: Self-pay | Admitting: Bariatrics

## 2021-04-29 NOTE — Progress Notes (Signed)
? ? ? ?Chief Complaint:  ? ?OBESITY ?Thomas [name] is here to discuss his progress with his obesity treatment plan along with follow-up of his obesity related diagnoses. Thomas [name] is on the Category 4 Plan and states he is following his eating plan approximately 95% of the time. Thomas [name] states he is doing gardening. ? ?Today's visit was #: 2 ?Starting weight: 249 lbs ?Starting date: 04/14/2021 ?Today's weight: 249 lbs ?Today's date: 04/28/2021 ?Total lbs lost to date: 0 ?Total lbs lost since last in-office visit: 0 ? ?Interim History: Thomas [name]'s weight remain the same since his last visit. His bioimpedance test shows that he is up about 3 lbs of water.  ? ?Subjective:  ? ?1. Type 2 diabetes mellitus without complication, without long-term current use of insulin ([place]) ?Sherif has been on Metformin. He notes hunger.  ? ?2. Gastroesophageal reflux disease without esophagitis ?Thomas [name] is currently taking Prevacid. ? ?3. Vitamin D deficiency ?Thomas [name]'s last Vitamin D was 43.6.  ? ?Assessment/Plan:  ? ?1. Type 2 diabetes mellitus without complication, without long-term current use of insulin ([place]) ?We will refill Zofran 4 mg for nausea with no refills. We will refill Ozempic 0.25 mg for 1 month with no refills. Good blood sugar control is important to decrease the likelihood of diabetic complications such as nephropathy, neuropathy, limb loss, blindness, coronary artery disease, and death. Intensive lifestyle modification including diet, exercise and weight loss are the first line of treatment for diabetes.  ? ?- Semaglutide,0.25 or 0.'5MG'$ /DOS, (OZEMPIC, 0.25 OR 0.5 MG/DOSE,) 2 MG/1.5ML SOPN; Inject 0.25 mg into the skin once a week.  Dispense: 1.5 mL; Refill: 0 ?- ondansetron (ZOFRAN) 4 MG tablet; Take 1 tablet (4 mg total) by mouth every 8 (eight) hours as needed for nausea or vomiting.  Dispense: 20 tablet; Refill: 0 ? ?2. Gastroesophageal reflux disease without esophagitis ?Intensive lifestyle modifications are the first line  treatment for this issue. Dagon will continue Prevacid. We discussed several lifestyle modifications today and he will continue to work on diet, exercise and weight loss efforts. Orders and follow up as documented in patient record.  ? ?Counseling ?If a person has gastroesophageal reflux disease (GERD), food and stomach acid move back up into the esophagus and cause symptoms or problems such as damage to the esophagus. ?Anti-reflux measures include: raising the head of the bed, avoiding tight clothing or belts, avoiding eating late at night, not lying down shortly after mealtime, and achieving weight loss. ?Avoid ASA, NSAID's, caffeine, alcohol, and tobacco.  ?OTC Pepcid and/or Tums are often very helpful for as needed use.  ?However, for persisting chronic or daily symptoms, stronger medications like Omeprazole may be needed. ?You may need to avoid foods and drinks such as: ?Coffee and tea (with or without caffeine). ?Drinks that contain alcohol. ?Energy drinks and sports drinks. ?Bubbly (carbonated) drinks or sodas. ?Chocolate and cocoa. ?Peppermint and mint flavorings. ?Garlic and onions. ?Horseradish. ?Spicy and acidic foods. These include peppers, chili powder, curry powder, vinegar, hot sauces, and BBQ sauce. ?Citrus fruit juices and citrus fruits, such as oranges, lemons, and limes. ?Tomato-based foods. These include red sauce, chili, salsa, and pizza with red sauce. ?Fried and fatty foods. These include donuts, french fries, potato chips, and high-fat dressings. ?High-fat meats. These include hot dogs, rib eye steak, sausage, ham, and bacon.  ? ?3. Vitamin D deficiency ?Low Vitamin D level contributes to fatigue and are associated with obesity, breast, and colon cancer. We will refill prescription Vitamin D 50,000 IU every week for 1 month  with no refills and Christpoher will follow-up for routine testing of Vitamin D, at least 2-3 times per year to avoid over-replacement. ? ?- Vitamin D, Ergocalciferol,  (DRISDOL) 1.25 MG (50000 UNIT) CAPS capsule; Take 1 capsule (50,000 Units total) by mouth every 14 (fourteen) days.  Dispense: 4 capsule; Refill: 0 ? ?4. Obesity, current BMI 35.8 ?Thomas [name] is currently in the action stage of change. As such, his goal is to continue with weight loss efforts. He has agreed to the Category 3 Plan plus 100 extra calories.  ? ?Thomas [name] will continue meal planning. He have no added salt. He will increase his water intake. We reviewed labs from 04/14/2021 Vitamin D, and insulin. His A1C was 6.5 from 02/24/2021. ? ?Exercise goals:  Thomas [name] will be more active. He will continue with some gardening.  ? ?Behavioral modification strategies: increasing lean protein intake, decreasing simple carbohydrates, increasing vegetables, increasing water intake, decreasing eating out, no skipping meals, meal planning and cooking strategies, keeping healthy foods in the home, and planning for success. ? ?Thomas [name] has agreed to follow-up with our clinic in 2 weeks with nurse practitioner and 4 weeks with myself. Thomas [name] Kitchen He was informed of the importance of frequent follow-up visits to maximize his success with intensive lifestyle modifications for his multiple health conditions.  ? ?Objective:  ? ?Blood pressure (!) 149/77, pulse 76, temperature 98.1 ?F (36.7 ?C), height '5\' 10"'$  (1.778 m), weight 249 lb (112.9 kg), SpO2 98 %. ?Body mass index is 35.73 kg/m?. ? ?General: Cooperative, alert, well developed, in no acute distress. ?HEENT: Conjunctivae and lids unremarkable. ?Cardiovascular: Regular rhythm.  ?Lungs: Normal work of breathing. ?Neurologic: No focal deficits.  ? ?Lab Results  ?Component Value Date  ? CREATININE 1.12 02/24/2021  ? BUN 14 02/24/2021  ? NA 139 02/24/2021  ? K 4.2 02/24/2021  ? CL 103 02/24/2021  ? CO2 28 02/24/2021  ? ?Lab Results  ?Component Value Date  ? ALT 34 02/24/2021  ? AST 24 02/24/2021  ? ALKPHOS 85 02/24/2021  ? BILITOT 0.6 02/24/2021  ? ?Lab Results  ?Component Value Date  ? HGBA1C  6.5 02/24/2021  ? HGBA1C 6.0 12/25/2018  ? HGBA1C 5.7 (H) 02/12/2018  ? HGBA1C 6.5 (H) 10/23/2017  ? HGBA1C 8.6 (H) 07/07/2017  ? ?Lab Results  ?Component Value Date  ? INSULIN 18.0 04/14/2021  ? INSULIN 10.9 02/12/2018  ? INSULIN 14.1 10/23/2017  ? ?Lab Results  ?Component Value Date  ? TSH 1.45 02/24/2021  ? ?Lab Results  ?Component Value Date  ? CHOL 176 02/24/2021  ? HDL 34.40 (L) 02/24/2021  ? LDLCALC 122 (H) 02/24/2021  ? TRIG 96.0 02/24/2021  ? CHOLHDL 5 02/24/2021  ? ?Lab Results  ?Component Value Date  ? VD25OH 43.6 04/14/2021  ? VD25OH 56.1 02/12/2018  ? VD25OH 38.4 10/23/2017  ? ?Lab Results  ?Component Value Date  ? WBC 6.0 02/24/2021  ? HGB 14.8 02/24/2021  ? HCT 44.0 02/24/2021  ? MCV 91.1 02/24/2021  ? PLT 326.0 02/24/2021  ? ?No results found for: IRON, TIBC, FERRITIN ? ?Attestation Statements:  ? ?Reviewed by clinician on day of visit: allergies, medications, problem list, medical history, surgical history, family history, social history, and previous encounter notes. ? ?I, Lizbeth Bark, RMA, am acting as transcriptionist for [employer], DO. ? ?I have reviewed the above documentation for accuracy and completeness, and I agree with the above. Jearld Lesch, DO ? ?

## 2021-05-13 ENCOUNTER — Encounter (INDEPENDENT_AMBULATORY_CARE_PROVIDER_SITE_OTHER): Payer: Self-pay | Admitting: Nurse Practitioner

## 2021-05-13 ENCOUNTER — Ambulatory Visit (INDEPENDENT_AMBULATORY_CARE_PROVIDER_SITE_OTHER): Payer: BC Managed Care – PPO | Admitting: Nurse Practitioner

## 2021-05-13 VITALS — BP 149/65 | HR 71 | Temp 98.0°F | Ht 70.0 in | Wt 241.0 lb

## 2021-05-13 DIAGNOSIS — R030 Elevated blood-pressure reading, without diagnosis of hypertension: Secondary | ICD-10-CM

## 2021-05-13 DIAGNOSIS — E559 Vitamin D deficiency, unspecified: Secondary | ICD-10-CM

## 2021-05-13 DIAGNOSIS — I10 Essential (primary) hypertension: Secondary | ICD-10-CM

## 2021-05-13 DIAGNOSIS — Z9189 Other specified personal risk factors, not elsewhere classified: Secondary | ICD-10-CM

## 2021-05-13 DIAGNOSIS — E119 Type 2 diabetes mellitus without complications: Secondary | ICD-10-CM

## 2021-05-13 DIAGNOSIS — E785 Hyperlipidemia, unspecified: Secondary | ICD-10-CM | POA: Diagnosis not present

## 2021-05-13 DIAGNOSIS — Z6834 Body mass index (BMI) 34.0-34.9, adult: Secondary | ICD-10-CM

## 2021-05-13 DIAGNOSIS — Z7985 Long-term (current) use of injectable non-insulin antidiabetic drugs: Secondary | ICD-10-CM

## 2021-05-13 DIAGNOSIS — E669 Obesity, unspecified: Secondary | ICD-10-CM

## 2021-05-13 MED ORDER — INSULIN PEN NEEDLE 31G X 5 MM MISC: 0 refills | Status: DC

## 2021-05-13 MED ORDER — VITAMIN D (ERGOCALCIFEROL) 1.25 MG (50000 UNIT) PO CAPS: 50000.0000 [IU] | ORAL_CAPSULE | ORAL | 0 refills | Status: DC

## 2021-05-18 ENCOUNTER — Other Ambulatory Visit (INDEPENDENT_AMBULATORY_CARE_PROVIDER_SITE_OTHER): Payer: Self-pay | Admitting: Bariatrics

## 2021-05-18 DIAGNOSIS — E559 Vitamin D deficiency, unspecified: Secondary | ICD-10-CM

## 2021-05-26 NOTE — Progress Notes (Signed)
? ? ? ?Chief Complaint:  ? ?OBESITY ?Thomas [name] is here to discuss his progress with his obesity treatment plan along with follow-up of his obesity related diagnoses. Corgan is on the Category 3 Plan plus 100 calories and states he is following his eating plan approximately 98% of the time. Aulden states he is increasing his activity. ? ?Today's visit was #: 3 ?Starting weight: 249 lbs ?Starting date: 04/14/2021 ?Today's weight: 241 lbs ?Today's date: 05/13/2021 ?Total lbs lost to date: 8 lbs ?Total lbs lost since last in-office visit: 8 lbs ? ?Interim History: Thomas [name] has done will with weight loss. He denies hunger and cravings. He is overall doing well.  ? ?Subjective:  ? ?1. Type 2 diabetes mellitus without complication, without long-term current use of insulin ([place]) ?Thomas [name] is currently taking Ozempic 0.25 mg. He has had some side effects of nausea and feeling sluggish. He has taken Metformin in the past. His fasting blood sugars range 109-119. He denies hypoglycemia. His last A1C was 6.5.  ? ?2. Elevated blood pressure  ?Thomas [name] has never been on medications. He denies chest pain and palpitations. His last blood pressure on 04/28/2021 was 149/77 and today 05/13/2021 it was 149/65. ? ?3. Hyperlipidemia, unspecified hyperlipidemia type ?Thomas [name] has never been on medications.  ? ?4. Vitamin D deficiency ?Thomas [name] is currently taking Vitamin D 50,000 IU every 14 days. He denies side effects nausea, vomiting and muscle weakness.  ? ?5. At risk for side effect of medication ?Thomas [name] is at risk for side effect of Ozempic. ? ?Assessment/Plan:  ? ?1. Type 2 diabetes mellitus without complication, without long-term current use of insulin ([place]) ?Thomas [name] will continue Ozempic 0.25 mg due to side effects. He feels side effects are better and would like to continue current dose.  We will refill Insulin needles for 1 month with no refills. Good blood sugar control is important to decrease the likelihood of diabetic complications  such as nephropathy, neuropathy, limb loss, blindness, coronary artery disease, and death. Intensive lifestyle modification including diet, exercise and weight loss are the first line of treatment for diabetes.  ? ?- Insulin Pen Needle 31G X 5 MM MISC; Use as directed with Ozempic  Dispense: 30 each; Refill: 0 ? ?2. Elevated blood pressure  ?Thomas [name] will discuss elevated blood pressure with his primary care physician. She is working on healthy weight loss and exercise to improve blood pressure control. We will watch for signs of hypotension as he continues his lifestyle modifications. ? ?3. Hyperlipidemia, unspecified hyperlipidemia type ?Thomas [name] will follow up with his primary care physician. He is working on healthy weight loss and exercise to improve blood pressure control. We will watch for signs of hypotension as he continues his lifestyle modifications. ? ?4. Vitamin D deficiency ?Thomas [name] Vitamin D level contributes to fatigue and are associated with obesity, breast, and colon cancer. We will refill prescription Vitamin D 50,000 IU every 2 weeks for 2 months and Trisha will follow-up for routine testing of Vitamin D, at least 2-3 times per year to avoid over-replacement. We will recheck labs at next office visit.  ? ?- Vitamin D, Ergocalciferol, (DRISDOL) 1.25 MG (50000 UNIT) CAPS capsule; Take 1 capsule (50,000 Units total) by mouth every 14 (fourteen) days.  Dispense: 4 capsule; Refill: 0 ? ?5. At risk for side effect of medication ?Thomas [name] was given approximately 15 minutes of drug side effect counseling today.  We discussed side effect possibility and risk versus benefits. Nova agreed to the medication and will contact this office  if these side effects are intolerable. ? ?Repetitive spaced learning was employed today to elicit superior memory formation and behavioral change.  ? ?6. Obesity, current BMI 34.6 ?Thomas [name] is currently in the action stage of change. As such, his goal is to continue with weight  loss efforts. He has agreed to the Category 3 Plan plus 100 calories.  ? ?Exercise goals:  Thomas [name] will start walking.   ? ?Behavioral modification strategies: increasing lean protein intake, increasing water intake, and no skipping meals. ? ?Thomas [name] has agreed to follow-up with our clinic in 3 weeks. He was informed of the importance of frequent follow-up visits to maximize his success with intensive lifestyle modifications for his multiple health conditions.  ? ?Objective:  ? ?Blood pressure (!) 149/65, pulse 71, temperature 98 ?F (36.7 ?C), temperature source Oral, height '5\' 10"'$  (1.778 m), weight 241 lb (109.3 kg), SpO2 99 %. ?Body mass index is 34.58 kg/m?. ? ?General: Cooperative, alert, well developed, in no acute distress. ?HEENT: Conjunctivae and lids unremarkable. ?Cardiovascular: Regular rhythm.  ?Lungs: Normal work of breathing. ?Neurologic: No focal deficits.  ? ?Lab Results  ?Component Value Date  ? CREATININE 1.12 02/24/2021  ? BUN 14 02/24/2021  ? NA 139 02/24/2021  ? K 4.2 02/24/2021  ? CL 103 02/24/2021  ? CO2 28 02/24/2021  ? ?Lab Results  ?Component Value Date  ? ALT 34 02/24/2021  ? AST 24 02/24/2021  ? ALKPHOS 85 02/24/2021  ? BILITOT 0.6 02/24/2021  ? ?Lab Results  ?Component Value Date  ? HGBA1C 6.5 02/24/2021  ? HGBA1C 6.0 12/25/2018  ? HGBA1C 5.7 (H) 02/12/2018  ? HGBA1C 6.5 (H) 10/23/2017  ? HGBA1C 8.6 (H) 07/07/2017  ? ?Lab Results  ?Component Value Date  ? INSULIN 18.0 04/14/2021  ? INSULIN 10.9 02/12/2018  ? INSULIN 14.1 10/23/2017  ? ?Lab Results  ?Component Value Date  ? TSH 1.45 02/24/2021  ? ?Lab Results  ?Component Value Date  ? CHOL 176 02/24/2021  ? HDL 34.40 (L) 02/24/2021  ? LDLCALC 122 (H) 02/24/2021  ? TRIG 96.0 02/24/2021  ? CHOLHDL 5 02/24/2021  ? ?Lab Results  ?Component Value Date  ? VD25OH 43.6 04/14/2021  ? VD25OH 56.1 02/12/2018  ? VD25OH 38.4 10/23/2017  ? ?Lab Results  ?Component Value Date  ? WBC 6.0 02/24/2021  ? HGB 14.8 02/24/2021  ? HCT 44.0 02/24/2021  ? MCV  91.1 02/24/2021  ? PLT 326.0 02/24/2021  ? ?No results found for: IRON, TIBC, FERRITIN ? ?Attestation Statements:  ? ?Reviewed by clinician on day of visit: allergies, medications, problem list, medical history, surgical history, family history, social history, and previous encounter notes. ? ?I, Lizbeth Bark, RMA, am acting as Location manager for Everardo Pacific, FNP. ? ?I have reviewed the above documentation for accuracy and completeness, and I agree with the above. Everardo Pacific, FNP  ?

## 2021-06-01 ENCOUNTER — Encounter (INDEPENDENT_AMBULATORY_CARE_PROVIDER_SITE_OTHER): Payer: Self-pay | Admitting: Bariatrics

## 2021-06-01 ENCOUNTER — Ambulatory Visit (INDEPENDENT_AMBULATORY_CARE_PROVIDER_SITE_OTHER): Payer: BC Managed Care – PPO | Admitting: Bariatrics

## 2021-06-01 VITALS — BP 147/85 | HR 85 | Temp 98.0°F | Ht 70.0 in | Wt 244.0 lb

## 2021-06-01 DIAGNOSIS — E66812 Obesity, class 2: Secondary | ICD-10-CM

## 2021-06-01 DIAGNOSIS — Z6835 Body mass index (BMI) 35.0-35.9, adult: Secondary | ICD-10-CM

## 2021-06-01 DIAGNOSIS — Z7985 Long-term (current) use of injectable non-insulin antidiabetic drugs: Secondary | ICD-10-CM

## 2021-06-01 DIAGNOSIS — E669 Obesity, unspecified: Secondary | ICD-10-CM | POA: Diagnosis not present

## 2021-06-01 DIAGNOSIS — E1169 Type 2 diabetes mellitus with other specified complication: Secondary | ICD-10-CM

## 2021-06-01 DIAGNOSIS — R030 Elevated blood-pressure reading, without diagnosis of hypertension: Secondary | ICD-10-CM | POA: Diagnosis not present

## 2021-06-01 DIAGNOSIS — E118 Type 2 diabetes mellitus with unspecified complications: Secondary | ICD-10-CM

## 2021-06-03 ENCOUNTER — Encounter: Payer: Self-pay | Admitting: Family Medicine

## 2021-06-03 ENCOUNTER — Ambulatory Visit: Payer: BC Managed Care – PPO | Admitting: Family Medicine

## 2021-06-03 VITALS — BP 132/74 | HR 78 | Temp 99.1°F | Wt 247.0 lb

## 2021-06-03 DIAGNOSIS — R110 Nausea: Secondary | ICD-10-CM

## 2021-06-03 DIAGNOSIS — E119 Type 2 diabetes mellitus without complications: Secondary | ICD-10-CM

## 2021-06-03 DIAGNOSIS — R197 Diarrhea, unspecified: Secondary | ICD-10-CM | POA: Diagnosis not present

## 2021-06-03 DIAGNOSIS — E669 Obesity, unspecified: Secondary | ICD-10-CM | POA: Diagnosis not present

## 2021-06-03 DIAGNOSIS — Z6832 Body mass index (BMI) 32.0-32.9, adult: Secondary | ICD-10-CM

## 2021-06-03 NOTE — Progress Notes (Signed)
? ?  Subjective:  ? ? Patient ID: Thomas [name], male    DOB: 1960/09/20, 61 y.o.   MRN: [id number] ? ?HPI ?Here for 5 days of nausea without vomiting, passing gas, belching, and diarrhea. No abdominal pain or fever. He says this does not feel like it does when his diverticulitis flares up. He has been going to the Weight Management clinic, and 4 weeks ago he was started on Ozempic. His last A1c in January was 6.5. His am fasting glucoses have been averaging 100-110.  ? ? ?Review of Systems  ?Constitutional: Negative.   ?Respiratory: Negative.    ?Cardiovascular: Negative.   ?Gastrointestinal:  Positive for diarrhea and nausea. Negative for abdominal distention, abdominal pain, blood in stool, constipation, rectal pain and vomiting.  ?Genitourinary: Negative.   ? ?   ?Objective:  ? Physical Exam ?Constitutional:   ?   Appearance: Normal appearance. He is not ill-appearing.  ?Cardiovascular:  ?   Rate and Rhythm: Normal rate and regular rhythm.  ?   Pulses: Normal pulses.  ?   Heart sounds: Normal heart sounds.  ?Pulmonary:  ?   Effort: Pulmonary effort is normal.  ?   Breath sounds: Normal breath sounds.  ?Abdominal:  ?   General: Abdomen is flat. Bowel sounds are normal. There is no distension.  ?   Palpations: Abdomen is soft. There is no mass.  ?   Tenderness: There is no abdominal tenderness. There is no guarding or rebound.  ?   Hernia: No hernia is present.  ?Neurological:  ?   Mental Status: He is alert.  ? ? ? ? ? ?   ?Assessment & Plan:  ?His is having typical GI side effects from the Ozempic, so we agreed to stop this. He will continue managing his diet and exercise. He will follow up with Weight Management on 07-06-21.  ?Alysia Penna, MD ? ? ?

## 2021-06-08 ENCOUNTER — Encounter (INDEPENDENT_AMBULATORY_CARE_PROVIDER_SITE_OTHER): Payer: Self-pay | Admitting: Bariatrics

## 2021-06-08 NOTE — Progress Notes (Signed)
? ? ? ?Chief Complaint:  ? ?OBESITY ?Thomas [name] is here to discuss his progress with his obesity treatment plan along with follow-up of his obesity related diagnoses. Thomas [name] is on the Category 3 Plan plus 100 calories and states he is following his eating plan approximately 80% of the time. 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 ? ?Today's visit was #: 4 ?Starting weight: 249 lbs ?Starting date: 04/14/2021 ?Today's weight: 244 lbs ?Today's date: 06/01/2021 ?Total lbs lost to date: 5 lbs ?Total lbs lost since last in-office visit: 0 ? ?Interim History: Thomas [name] is up 3 lbs since his last visit. His body water is up approximately 3 lbs since his last visit.  ? ?Subjective:  ? ?1. Type 2 diabetes with complication (HCC) ?Thomas [name] is taking Mounjaro currently on 0.25 mg. Thomas [name] gastrointestinal status may be viral.  ? ?2. Elevated blood pressure reading ?Thomas [name] is not on medications. His blood pressure was 147/85 he is not feeling well today.  ? ?Assessment/Plan:  ? ?1. Type 2 diabetes with complication (HCC) ?Thomas [name] will stop the [facility] for now and he may resume in the future. Good blood sugar control is important to decrease the likelihood of diabetic complications such as nephropathy, neuropathy, limb loss, blindness, coronary artery disease, and death. Intensive lifestyle modification including diet, exercise and weight loss are the first line of treatment for diabetes.  ? ?2. Elevated blood pressure reading ?Thomas [name] will have no added salt. We will follow over time. He is working on healthy weight loss and exercise to improve blood pressure control. We will watch for signs of hypotension as he continues his lifestyle modifications. ? ?3. Obesity, Current BMI 35.0 ?Thomas [name] is currently in the action stage of change. As such, his goal is to continue with weight loss efforts. He has agreed to the Category 3 Plan.  ? ?Thomas [name] will follow the plan 80-90%. He will continue meal  planning.  ? ?Exercise goals:  As is. He will continue walking.  ? ?Behavioral modification strategies: increasing lean protein intake, decreasing simple carbohydrates, increasing vegetables, increasing water intake, decreasing eating out, no skipping meals, meal planning and cooking strategies, keeping healthy foods in the home, and planning for success. ? ?Thomas [name] has agreed to follow-up with our clinic in 3 weeks with nurse practitioner and 6 weeks with myself. He was informed of the importance of frequent follow-up visits to maximize his success with intensive lifestyle modifications for his multiple health conditions.  ? ?Objective:  ? ?Blood pressure (!) 147/85, pulse 85, temperature 98 ?F (36.7 ?C), height '5\' 10"'$  (1.778 m), weight 244 lb (110.7 kg), SpO2 98 %. ?Body mass index is 35.01 kg/m?. ? ?General: Cooperative, alert, well developed, in no acute distress. ?HEENT: Conjunctivae and lids unremarkable. ?Cardiovascular: Regular rhythm.  ?Lungs: Normal work of breathing. ?Neurologic: No focal deficits.  ? ?Lab Results  ?Component Value Date  ? CREATININE 1.12 02/24/2021  ? BUN 14 02/24/2021  ? NA 139 02/24/2021  ? K 4.2 02/24/2021  ? CL 103 02/24/2021  ? CO2 28 02/24/2021  ? ?Lab Results  ?Component Value Date  ? ALT 34 02/24/2021  ? AST 24 02/24/2021  ? ALKPHOS 85 02/24/2021  ? BILITOT 0.6 02/24/2021  ? ?Lab Results  ?Component Value Date  ? HGBA1C 6.5 02/24/2021  ? HGBA1C 6.0 12/25/2018  ? HGBA1C 5.7 (H) 02/12/2018  ? HGBA1C 6.5 (H) 10/23/2017  ? HGBA1C 8.6 (H) 07/07/2017  ? ?Lab Results  ?Component Value Date  ?  INSULIN 18.0 04/14/2021  ? INSULIN 10.9 02/12/2018  ? INSULIN 14.1 10/23/2017  ? ?Lab Results  ?Component Value Date  ? TSH 1.45 02/24/2021  ? ?Lab Results  ?Component Value Date  ? CHOL 176 02/24/2021  ? HDL 34.40 (L) 02/24/2021  ? LDLCALC 122 (H) 02/24/2021  ? TRIG 96.0 02/24/2021  ? CHOLHDL 5 02/24/2021  ? ?Lab Results  ?Component Value Date  ? VD25OH 43.6 04/14/2021  ? VD25OH 56.1 02/12/2018  ?  VD25OH 38.4 10/23/2017  ? ?Lab Results  ?Component Value Date  ? WBC 6.0 02/24/2021  ? HGB 14.8 02/24/2021  ? HCT 44.0 02/24/2021  ? MCV 91.1 02/24/2021  ? PLT 326.0 02/24/2021  ? ?No results found for: IRON, TIBC, FERRITIN ? ?Attestation Statements:  ? ?Reviewed by clinician on day of visit: allergies, medications, problem list, medical history, surgical history, family history, social history, and previous encounter notes. ? ?I, Lizbeth Bark, RMA, am acting as transcriptionist for [employer], DO. ? ?I have reviewed the above documentation for accuracy and completeness, and I agree with the above. Jearld Lesch, DO ? ?

## 2021-06-14 ENCOUNTER — Other Ambulatory Visit (INDEPENDENT_AMBULATORY_CARE_PROVIDER_SITE_OTHER): Payer: Self-pay | Admitting: Bariatrics

## 2021-06-14 DIAGNOSIS — E559 Vitamin D deficiency, unspecified: Secondary | ICD-10-CM

## 2021-06-15 ENCOUNTER — Ambulatory Visit: Payer: BC Managed Care – PPO | Admitting: Family Medicine

## 2021-06-15 ENCOUNTER — Encounter: Payer: Self-pay | Admitting: Family Medicine

## 2021-06-15 VITALS — BP 138/78 | HR 84 | Temp 98.8°F | Wt 247.0 lb

## 2021-06-15 DIAGNOSIS — J029 Acute pharyngitis, unspecified: Secondary | ICD-10-CM | POA: Diagnosis not present

## 2021-06-15 LAB — POCT RAPID STREP A (OFFICE): Rapid Strep A Screen: NEGATIVE

## 2021-06-15 LAB — POC COVID19 BINAXNOW: SARS Coronavirus 2 Ag: NEGATIVE

## 2021-06-15 MED ORDER — AMOXICILLIN-POT CLAVULANATE 875-125 MG PO TABS: 1.0000 | ORAL_TABLET | Freq: Two times a day (BID) | ORAL | 0 refills | Status: DC

## 2021-06-15 NOTE — Progress Notes (Signed)
? ?  Subjective:  ? ? Patient ID: Thomas [name], male    DOB: 1960-10-30, 61 y.o.   MRN: [id number] ? ?HPI ?Here for 4 days of fever to 101.8 degrees, a bad ST, and fatigue. No cough or body aches. We spoke a few weeks ago about some nausea he was having, and we felt it was a side effect on the Ozempic he was taking. After stopping th Ozempic, the nausea promptly resolved.  ? ? ?Review of Systems  ?Constitutional:  Positive for fatigue and fever.  ?HENT:  Positive for sore throat. Negative for sinus pain.   ?Eyes: Negative.   ?Respiratory: Negative.    ?Gastrointestinal: Negative.   ? ?   ?Objective:  ? Physical Exam ?Constitutional:   ?   Appearance: Normal appearance. He is not ill-appearing.  ?HENT:  ?   Right Ear: Tympanic membrane, ear canal and external ear normal.  ?   Left Ear: Tympanic membrane, ear canal and external ear normal.  ?   Nose: Nose normal.  ?   Mouth/Throat:  ?   Comments: Posterior OP is red and there are patches of white vesicles on both palatine areas  ?Eyes:  ?   Conjunctiva/sclera: Conjunctivae normal.  ?Pulmonary:  ?   Effort: Pulmonary effort is normal.  ?   Breath sounds: Normal breath sounds.  ?Lymphadenopathy:  ?   Cervical: No cervical adenopathy.  ?Neurological:  ?   Mental Status: He is alert.  ? ? ? ? ? ?   ?Assessment & Plan:  ?Pharyngitis, likely streptococcal. Treat with 10 days of Augmentin.  ?Alysia Penna, MD ? ? ? ?

## 2021-06-17 ENCOUNTER — Other Ambulatory Visit: Payer: Self-pay | Admitting: Pulmonary Disease

## 2021-07-04 ENCOUNTER — Other Ambulatory Visit (INDEPENDENT_AMBULATORY_CARE_PROVIDER_SITE_OTHER): Payer: Self-pay | Admitting: Nurse Practitioner

## 2021-07-04 DIAGNOSIS — E559 Vitamin D deficiency, unspecified: Secondary | ICD-10-CM

## 2021-07-06 ENCOUNTER — Ambulatory Visit (INDEPENDENT_AMBULATORY_CARE_PROVIDER_SITE_OTHER): Payer: BC Managed Care – PPO | Admitting: Bariatrics

## 2021-07-06 ENCOUNTER — Encounter (INDEPENDENT_AMBULATORY_CARE_PROVIDER_SITE_OTHER): Payer: Self-pay | Admitting: Bariatrics

## 2021-07-06 VITALS — BP 136/84 | HR 106 | Temp 98.0°F | Ht 70.0 in | Wt 243.0 lb

## 2021-07-06 DIAGNOSIS — E559 Vitamin D deficiency, unspecified: Secondary | ICD-10-CM

## 2021-07-06 DIAGNOSIS — R490 Dysphonia: Secondary | ICD-10-CM

## 2021-07-06 DIAGNOSIS — E669 Obesity, unspecified: Secondary | ICD-10-CM | POA: Diagnosis not present

## 2021-07-06 DIAGNOSIS — E1169 Type 2 diabetes mellitus with other specified complication: Secondary | ICD-10-CM

## 2021-07-06 DIAGNOSIS — E66812 Obesity, class 2: Secondary | ICD-10-CM

## 2021-07-06 DIAGNOSIS — Z6835 Body mass index (BMI) 35.0-35.9, adult: Secondary | ICD-10-CM

## 2021-07-06 MED ORDER — BUDESONIDE-FORMOTEROL FUMARATE 160-4.5 MCG/ACT IN AERO: 2.0000 | INHALATION_SPRAY | Freq: Two times a day (BID) | RESPIRATORY_TRACT | 0 refills | Status: DC

## 2021-07-06 MED ORDER — VITAMIN D (ERGOCALCIFEROL) 1.25 MG (50000 UNIT) PO CAPS: 50000.0000 [IU] | ORAL_CAPSULE | ORAL | 0 refills | Status: DC

## 2021-07-07 NOTE — Progress Notes (Signed)
Chief Complaint:   OBESITY Thomas [name] is here to discuss his progress with his obesity treatment plan along with follow-up of his obesity related diagnoses. Thomas [name] is on the Category 3 Plan and states he is following his eating plan approximately 60% of the time. Thomas [name] states he is walking and gardening for 60 minutes 4 times per week.  Today's visit was #: 5 Starting weight: 249 lbs Starting date: 04/14/2021 Today's weight: 243 lbs Today's date: 07/06/2021 Total lbs lost to date: 6 lbs Total lbs lost since last in-office visit: 1 lb  Interim History: Thomas [name] is down 1 additional pound since his last visit. He has been on vacation.   Subjective:   1. Vitamin D deficiency Thomas [name] is taking his medications as directed.   2. Diabetes mellitus type 2 in obese [facility]) Thomas [name] is not on medications currently.   3. 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 He has a history of bronchitis. He had bronchitis several weeks ago.   Assessment/Plan:   1. Vitamin D deficiency Low Vitamin D level contributes to fatigue and are associated with obesity, breast, and colon cancer. We will refill prescription Vitamin D 50,000 IU every week for 1 month with no refills and Bertha will follow-up for routine testing of Vitamin D, at least 2-3 times per year to avoid over-replacement.  - Vitamin D, Ergocalciferol, (DRISDOL) 1.25 MG (50000 UNIT) CAPS capsule; Take 1 capsule (50,000 Units total) by mouth every 14 (fourteen) days.  Dispense: 4 capsule; Refill: 0  2. Diabetes mellitus type 2 in obese [facility]) Thomas [name] will minimize starches and sweets. Good blood sugar control is important to decrease the likelihood of diabetic complications such as nephropathy, neuropathy, limb loss, blindness, coronary artery disease, and death. Intensive lifestyle modification including diet, exercise and weight loss are the first line of treatment for diabetes.   3. Hoarseness of voice Thomas [name] will  continue to use his inhalers. We will refill Symbicort 160-4.5 mcg with no refills.   - budesonide-formoterol (SYMBICORT) 160-4.5 MCG/ACT inhaler; Inhale 2 puffs into the lungs 2 (two) times daily.  Dispense: 1 each; Refill: 0  4. Obesity, Current BMI 35.0 Thomas [name] is currently in the action stage of change. As such, his goal is to continue with weight loss efforts. He has agreed to the Category 3 Plan.  Jennifer will adhere to the plan 80-90%. He will continue meal planning.    Exercise goals:  Thomas [name] will continue exercise.   Behavioral modification strategies: increasing lean protein intake, decreasing simple carbohydrates, increasing vegetables, increasing water intake, decreasing eating out, no skipping meals, meal planning and cooking strategies, keeping healthy foods in the home, and planning for success.  Thomas [name] has agreed to follow-up with our clinic in 2-3 weeks. He was informed of the importance of frequent follow-up visits to maximize his success with intensive lifestyle modifications for his multiple health conditions.   Objective:   Blood pressure 136/84, pulse (!) 106, temperature 98 F (36.7 C), height '5\' 10"'$  (1.778 m), weight 243 lb (110.2 kg), SpO2 95 %. Body mass index is 34.87 kg/m.  General: Cooperative, alert, well developed, in no acute distress. HEENT: Conjunctivae and lids unremarkable. Cardiovascular: Regular rhythm.  Lungs: Normal work of breathing. Neurologic: No focal deficits.   Lab Results  Component Value Date   CREATININE 1.12 02/24/2021   BUN 14 02/24/2021   NA 139 02/24/2021   K 4.2 02/24/2021   CL 103 02/24/2021   CO2 28 02/24/2021  Lab Results  Component Value Date   ALT 34 02/24/2021   AST 24 02/24/2021   ALKPHOS 85 02/24/2021   BILITOT 0.6 02/24/2021   Lab Results  Component Value Date   HGBA1C 6.5 02/24/2021   HGBA1C 6.0 12/25/2018   HGBA1C 5.7 (H) 02/12/2018   HGBA1C 6.5 (H) 10/23/2017   HGBA1C 8.6 (H) 07/07/2017   Lab  Results  Component Value Date   INSULIN 18.0 04/14/2021   INSULIN 10.9 02/12/2018   INSULIN 14.1 10/23/2017   Lab Results  Component Value Date   TSH 1.45 02/24/2021   Lab Results  Component Value Date   CHOL 176 02/24/2021   HDL 34.40 (L) 02/24/2021   LDLCALC 122 (H) 02/24/2021   TRIG 96.0 02/24/2021   CHOLHDL 5 02/24/2021   Lab Results  Component Value Date   VD25OH 43.6 04/14/2021   VD25OH 56.1 02/12/2018   VD25OH 38.4 10/23/2017   Lab Results  Component Value Date   WBC 6.0 02/24/2021   HGB 14.8 02/24/2021   HCT 44.0 02/24/2021   MCV 91.1 02/24/2021   PLT 326.0 02/24/2021   No results found for: IRON, TIBC, FERRITIN  Attestation Statements:   Reviewed by clinician on day of visit: allergies, medications, problem list, medical history, surgical history, family history, social history, and previous encounter notes.  I, Lizbeth Bark, RMA, am acting as Location manager for [employer], DO.  I have reviewed the above documentation for accuracy and completeness, and I agree with the above. Jearld Lesch, DO

## 2021-07-12 ENCOUNTER — Encounter (INDEPENDENT_AMBULATORY_CARE_PROVIDER_SITE_OTHER): Payer: Self-pay | Admitting: Bariatrics

## 2021-07-14 ENCOUNTER — Other Ambulatory Visit: Payer: Self-pay | Admitting: Family Medicine

## 2021-07-14 DIAGNOSIS — B9689 Other specified bacterial agents as the cause of diseases classified elsewhere: Secondary | ICD-10-CM

## 2021-07-19 ENCOUNTER — Ambulatory Visit (INDEPENDENT_AMBULATORY_CARE_PROVIDER_SITE_OTHER): Payer: BC Managed Care – PPO | Admitting: Bariatrics

## 2021-08-03 ENCOUNTER — Other Ambulatory Visit (INDEPENDENT_AMBULATORY_CARE_PROVIDER_SITE_OTHER): Payer: Self-pay | Admitting: Nurse Practitioner

## 2021-08-03 DIAGNOSIS — E559 Vitamin D deficiency, unspecified: Secondary | ICD-10-CM

## 2021-08-07 ENCOUNTER — Other Ambulatory Visit (INDEPENDENT_AMBULATORY_CARE_PROVIDER_SITE_OTHER): Payer: Self-pay | Admitting: Nurse Practitioner

## 2021-08-07 DIAGNOSIS — E119 Type 2 diabetes mellitus without complications: Secondary | ICD-10-CM

## 2021-08-18 ENCOUNTER — Encounter (INDEPENDENT_AMBULATORY_CARE_PROVIDER_SITE_OTHER): Payer: Self-pay | Admitting: Bariatrics

## 2021-08-18 ENCOUNTER — Ambulatory Visit (INDEPENDENT_AMBULATORY_CARE_PROVIDER_SITE_OTHER): Payer: BC Managed Care – PPO | Admitting: Bariatrics

## 2021-08-18 VITALS — BP 138/69 | HR 91 | Temp 98.1°F | Ht 70.0 in | Wt 245.0 lb

## 2021-08-18 DIAGNOSIS — Z6835 Body mass index (BMI) 35.0-35.9, adult: Secondary | ICD-10-CM

## 2021-08-18 DIAGNOSIS — E559 Vitamin D deficiency, unspecified: Secondary | ICD-10-CM | POA: Diagnosis not present

## 2021-08-18 DIAGNOSIS — K219 Gastro-esophageal reflux disease without esophagitis: Secondary | ICD-10-CM | POA: Diagnosis not present

## 2021-08-18 DIAGNOSIS — E669 Obesity, unspecified: Secondary | ICD-10-CM | POA: Diagnosis not present

## 2021-08-18 MED ORDER — VITAMIN D (ERGOCALCIFEROL) 1.25 MG (50000 UNIT) PO CAPS: 50000.0000 [IU] | ORAL_CAPSULE | ORAL | 0 refills | Status: DC

## 2021-08-24 NOTE — Progress Notes (Unsigned)
Chief Complaint:   OBESITY Thomas [name] is here to discuss his progress with his obesity treatment plan along with follow-up of his obesity related diagnoses. Thomas [name] is on the Category 3 Plan and states he is following his eating plan approximately 85% of the time. Thomas [name] states he is doing cardio, and walking 3 miles 3-4 times per week.    Today's visit was #: 6 Starting weight: 249 lbs Starting date: 04/14/2021 Today's weight: 245 lbs Today's date: 08/18/2021 Total lbs lost to date: 4 Total lbs lost since last in-office visit: 0  Interim History: Thomas [name] is up 2 pounds since his last visit.  He has been on a 10-day course of steroids and on vacation since his last visit  Subjective:   1. Vitamin D deficiency Thomas [name] is taking vitamin D.  2. Gastroesophageal reflux disease, unspecified whether esophagitis present Thomas [name] is currently taking Prevacid.  Assessment/Plan:   1. Vitamin D deficiency Thomas [name] will continue prescription vitamin D 50,000 units every 14 days, we will refill for 2 months.  - Vitamin D, Ergocalciferol, (DRISDOL) 1.25 MG (50000 UNIT) CAPS capsule; Take 1 capsule (50,000 Units total) by mouth every 14 (fourteen) days.  Dispense: 4 capsule; Refill: 0  2. Gastroesophageal reflux disease, unspecified whether esophagitis present Thomas [name] will continue taking Prevacid.   3. Obesity, Current BMI 35.2 Thomas [name] is currently in the action stage of change. As such, his goal is to continue with weight loss efforts. He has agreed to the Category 3 Plan.   Meal planning and intentional eating were discussed.  The complete guide to fasting was given to the patient.  Exercise goals: As is.   Behavioral modification strategies: increasing lean protein intake, decreasing simple carbohydrates, increasing vegetables, increasing water intake, decreasing eating out, no skipping meals, meal planning and cooking strategies, keeping healthy foods in the home, and planning for  success.  Thomas [name] has agreed to follow-up with our clinic in 3 to 4 weeks. He was informed of the importance of frequent follow-up visits to maximize his success with intensive lifestyle modifications for his multiple health conditions.   Objective:   Blood pressure 138/69, pulse 91, temperature 98.1 F (36.7 C), height '5\' 10"'$  (1.778 m), weight 245 lb (111.1 kg), SpO2 98 %. Body mass index is 35.15 kg/m.  General: Cooperative, alert, well developed, in no acute distress. HEENT: Conjunctivae and lids unremarkable. Cardiovascular: Regular rhythm.  Lungs: Normal work of breathing. Neurologic: No focal deficits.   Lab Results  Component Value Date   CREATININE 1.12 02/24/2021   BUN 14 02/24/2021   NA 139 02/24/2021   K 4.2 02/24/2021   CL 103 02/24/2021   CO2 28 02/24/2021   Lab Results  Component Value Date   ALT 34 02/24/2021   AST 24 02/24/2021   ALKPHOS 85 02/24/2021   BILITOT 0.6 02/24/2021   Lab Results  Component Value Date   HGBA1C 6.5 02/24/2021   HGBA1C 6.0 12/25/2018   HGBA1C 5.7 (H) 02/12/2018   HGBA1C 6.5 (H) 10/23/2017   HGBA1C 8.6 (H) 07/07/2017   Lab Results  Component Value Date   INSULIN 18.0 04/14/2021   INSULIN 10.9 02/12/2018   INSULIN 14.1 10/23/2017   Lab Results  Component Value Date   TSH 1.45 02/24/2021   Lab Results  Component Value Date   CHOL 176 02/24/2021   HDL 34.40 (L) 02/24/2021   LDLCALC 122 (H) 02/24/2021   TRIG 96.0 02/24/2021   CHOLHDL 5 02/24/2021   Lab Results  Component  Value Date   VD25OH 43.6 04/14/2021   VD25OH 56.1 02/12/2018   VD25OH 38.4 10/23/2017   Lab Results  Component Value Date   WBC 6.0 02/24/2021   HGB 14.8 02/24/2021   HCT 44.0 02/24/2021   MCV 91.1 02/24/2021   PLT 326.0 02/24/2021   No results found for: "IRON", "TIBC", "FERRITIN"  Attestation Statements:   Reviewed by clinician on day of visit: allergies, medications, problem list, medical history, surgical history, family history,  social history, and previous encounter notes.   Wilhemena Durie, am acting as Location manager for [employer], DO.  I have reviewed the above documentation for accuracy and completeness, and I agree with the above. Jearld Lesch, DO

## 2021-08-25 ENCOUNTER — Encounter (INDEPENDENT_AMBULATORY_CARE_PROVIDER_SITE_OTHER): Payer: Self-pay | Admitting: Bariatrics

## 2021-09-08 ENCOUNTER — Encounter (INDEPENDENT_AMBULATORY_CARE_PROVIDER_SITE_OTHER): Payer: Self-pay

## 2021-09-21 ENCOUNTER — Encounter (INDEPENDENT_AMBULATORY_CARE_PROVIDER_SITE_OTHER): Payer: Self-pay | Admitting: Bariatrics

## 2021-09-21 ENCOUNTER — Ambulatory Visit (INDEPENDENT_AMBULATORY_CARE_PROVIDER_SITE_OTHER): Payer: BC Managed Care – PPO | Admitting: Bariatrics

## 2021-09-21 VITALS — BP 119/77 | HR 91 | Temp 97.8°F | Ht 70.0 in | Wt 223.0 lb

## 2021-09-21 DIAGNOSIS — E559 Vitamin D deficiency, unspecified: Secondary | ICD-10-CM | POA: Diagnosis not present

## 2021-09-21 DIAGNOSIS — E785 Hyperlipidemia, unspecified: Secondary | ICD-10-CM | POA: Diagnosis not present

## 2021-09-21 DIAGNOSIS — E669 Obesity, unspecified: Secondary | ICD-10-CM | POA: Diagnosis not present

## 2021-09-21 DIAGNOSIS — E1169 Type 2 diabetes mellitus with other specified complication: Secondary | ICD-10-CM | POA: Insufficient documentation

## 2021-09-21 DIAGNOSIS — E66812 Obesity, class 2: Secondary | ICD-10-CM | POA: Insufficient documentation

## 2021-09-21 DIAGNOSIS — Z6832 Body mass index (BMI) 32.0-32.9, adult: Secondary | ICD-10-CM

## 2021-09-21 MED ORDER — VITAMIN D (ERGOCALCIFEROL) 1.25 MG (50000 UNIT) PO CAPS: 50000.0000 [IU] | ORAL_CAPSULE | ORAL | 0 refills | Status: DC

## 2021-09-22 LAB — COMPREHENSIVE METABOLIC PANEL
ALT: 33 IU/L (ref 0–44)
AST: 24 IU/L (ref 0–40)
Albumin/Globulin Ratio: 2 (ref 1.2–2.2)
Albumin: 4.5 g/dL (ref 3.9–4.9)
Alkaline Phosphatase: 88 IU/L (ref 44–121)
BUN/Creatinine Ratio: 8 — ABNORMAL LOW (ref 10–24)
BUN: 9 mg/dL (ref 8–27)
Bilirubin Total: 0.3 mg/dL (ref 0.0–1.2)
CO2: 22 mmol/L (ref 20–29)
Calcium: 9.8 mg/dL (ref 8.6–10.2)
Chloride: 103 mmol/L (ref 96–106)
Creatinine, Ser: 1.2 mg/dL (ref 0.76–1.27)
Globulin, Total: 2.2 g/dL (ref 1.5–4.5)
Glucose: 123 mg/dL — ABNORMAL HIGH (ref 70–99)
Potassium: 4 mmol/L (ref 3.5–5.2)
Sodium: 141 mmol/L (ref 134–144)
Total Protein: 6.7 g/dL (ref 6.0–8.5)
eGFR: 68 mL/min/{1.73_m2} (ref >59)

## 2021-09-22 LAB — HEMOGLOBIN A1C
Est. average glucose Bld gHb Est-mCnc: 111 mg/dL
Hgb A1c MFr Bld: 5.5 % (ref 4.8–5.6)

## 2021-09-22 LAB — LIPID PANEL WITH LDL/HDL RATIO
Cholesterol, Total: 166 mg/dL (ref 100–199)
HDL: 39 mg/dL — ABNORMAL LOW (ref >39)
LDL Chol Calc (NIH): 113 mg/dL — ABNORMAL HIGH (ref 0–99)
LDL/HDL Ratio: 2.9 ratio (ref 0.0–3.6)
Triglycerides: 73 mg/dL (ref 0–149)
VLDL Cholesterol Cal: 14 mg/dL (ref 5–40)

## 2021-09-22 LAB — TSH+T4F+T3FREE
Free T4: 1.28 ng/dL (ref 0.82–1.77)
T3, Free: 2.5 pg/mL (ref 2.0–4.4)
TSH: 1.15 u[IU]/mL (ref 0.450–4.500)

## 2021-09-22 LAB — INSULIN, RANDOM: INSULIN: 12 u[IU]/mL (ref 2.6–24.9)

## 2021-09-22 LAB — VITAMIN D 25 HYDROXY (VIT D DEFICIENCY, FRACTURES): Vit D, 25-Hydroxy: 55.5 ng/mL (ref 30.0–100.0)

## 2021-09-27 NOTE — Progress Notes (Unsigned)
Chief Complaint:   OBESITY Thomas [name] is here to discuss his progress with his obesity treatment plan along with follow-up of his obesity related diagnoses. Ajene is on the Category 3 Plan. Tyreece states he is walking 2 to 3 miles 3-4 times per week.  Today's visit was #: 7 Starting weight: 249 lbs Starting date: 04/14/2021 Today's weight: 223 lbs Today's date: 09/21/21 Total lbs lost to date: 26 Total lbs lost since last in-office visit: 22  Interim History: He is down 22 pounds since his last visit.  He has been fasting.  Subjective:   1. Vitamin D deficiency Taking vitamin D as tolerated.  2. Diabetes mellitus type 2 in obese (HCC) No medication.  Blood sugars in the 80s.  3. Hyperlipidemia, unspecified hyperlipidemia type No medication.  Assessment/Plan:   1. Vitamin D deficiency 1. Refill - Vitamin D, Ergocalciferol, (DRISDOL) 1.25 MG (50000 UNIT) CAPS capsule; Take 1 capsule (50,000 Units total) by mouth every 14 (fourteen) days.  Dispense: 4 capsule; Refill: 0 2.  Check vitamin D level - VITAMIN D 25 Hydroxy (Vit-D Deficiency, Fractures)  2. Diabetes mellitus type 2 in obese ([place]) 1.  Will keep all carbohydrates low (sugars and starches). 2.  Check A1c, insulin. - Comprehensive metabolic panel - Insulin, random - Lipid Panel With LDL/HDL Ratio - TSH+T4F+T3Free - VITAMIN D 25 Hydroxy (Vit-D Deficiency, Fractures) - Hemoglobin A1c  3. Hyperlipidemia, unspecified hyperlipidemia type Check lipids. - Lipid Panel With LDL/HDL Ratio  4. Obesity, Current BMI 32.1 1.  Meal planning. 2.  Intentional eating.  Mads is currently in the action stage of change. As such, his goal is to continue with weight loss efforts. He has agreed to the Category 3 Plan and intermittent fasting.  Exercise goals: Continue to exercise (walking 3 miles) and will be going to the gym.  Behavioral modification strategies: increasing lean protein intake, decreasing simple  carbohydrates, increasing vegetables, increasing water intake, decreasing eating out, no skipping meals, meal planning and cooking strategies, keeping healthy foods in the home, and planning for success.  Jashawn has agreed to follow-up with our clinic in 4 weeks. He was informed of the importance of frequent follow-up visits to maximize his success with intensive lifestyle modifications for his multiple health conditions.   Gaetano was informed we would discuss his lab results at his next visit unless there is a critical issue that needs to be addressed sooner. Diandre agreed to keep his next visit at the agreed upon time to discuss these results.  Objective:   Blood pressure 119/77, pulse 91, temperature 97.8 F (36.6 C), height '5\' 10"'$  (1.778 m), weight 223 lb (101.2 kg), SpO2 98 %. Body mass index is 32 kg/m.  General: Cooperative, alert, well developed, in no acute distress. HEENT: Conjunctivae and lids unremarkable. Cardiovascular: Regular rhythm.  Lungs: Normal work of breathing. Neurologic: No focal deficits.   Lab Results  Component Value Date   CREATININE 1.20 09/21/2021   BUN 9 09/21/2021   NA 141 09/21/2021   K 4.0 09/21/2021   CL 103 09/21/2021   CO2 22 09/21/2021   Lab Results  Component Value Date   ALT 33 09/21/2021   AST 24 09/21/2021   ALKPHOS 88 09/21/2021   BILITOT 0.3 09/21/2021   Lab Results  Component Value Date   HGBA1C 5.5 09/21/2021   HGBA1C 6.5 02/24/2021   HGBA1C 6.0 12/25/2018   HGBA1C 5.7 (H) 02/12/2018   HGBA1C 6.5 (H) 10/23/2017   Lab Results  Component  Value Date   INSULIN 12.0 09/21/2021   INSULIN 18.0 04/14/2021   INSULIN 10.9 02/12/2018   INSULIN 14.1 10/23/2017   Lab Results  Component Value Date   TSH 1.150 09/21/2021   Lab Results  Component Value Date   CHOL 166 09/21/2021   HDL 39 (L) 09/21/2021   LDLCALC 113 (H) 09/21/2021   TRIG 73 09/21/2021   CHOLHDL 5 02/24/2021   Lab Results  Component Value Date   VD25OH  55.5 09/21/2021   VD25OH 43.6 04/14/2021   VD25OH 56.1 02/12/2018   Lab Results  Component Value Date   WBC 6.0 02/24/2021   HGB 14.8 02/24/2021   HCT 44.0 02/24/2021   MCV 91.1 02/24/2021   PLT 326.0 02/24/2021   No results found for: "IRON", "TIBC", "FERRITIN"   Attestation Statements:   Reviewed by clinician on day of visit: allergies, medications, problem list, medical history, surgical history, family history, social history, and previous encounter notes.  I, Dawn Whitmire, FNP-C, am acting as transcriptionist for Dr. Jearld Lesch.  I have reviewed the above documentation for accuracy and completeness, and I agree with the above. Jearld Lesch, DO

## 2021-09-28 ENCOUNTER — Encounter (INDEPENDENT_AMBULATORY_CARE_PROVIDER_SITE_OTHER): Payer: Self-pay | Admitting: Bariatrics

## 2021-10-11 ENCOUNTER — Other Ambulatory Visit: Payer: Self-pay | Admitting: Family Medicine

## 2021-10-11 DIAGNOSIS — B9689 Other specified bacterial agents as the cause of diseases classified elsewhere: Secondary | ICD-10-CM

## 2021-10-14 ENCOUNTER — Other Ambulatory Visit (INDEPENDENT_AMBULATORY_CARE_PROVIDER_SITE_OTHER): Payer: Self-pay | Admitting: Bariatrics

## 2021-10-14 DIAGNOSIS — E559 Vitamin D deficiency, unspecified: Secondary | ICD-10-CM

## 2021-10-19 ENCOUNTER — Ambulatory Visit: Payer: BC Managed Care – PPO | Admitting: Family Medicine

## 2021-10-19 ENCOUNTER — Encounter: Payer: Self-pay | Admitting: Family Medicine

## 2021-10-19 VITALS — BP 128/82 | HR 81 | Temp 98.1°F | Wt 235.0 lb

## 2021-10-19 DIAGNOSIS — J3489 Other specified disorders of nose and nasal sinuses: Secondary | ICD-10-CM

## 2021-10-19 DIAGNOSIS — J019 Acute sinusitis, unspecified: Secondary | ICD-10-CM

## 2021-10-19 LAB — POCT RAPID STREP A (OFFICE): Rapid Strep A Screen: NEGATIVE

## 2021-10-19 LAB — POC COVID19 BINAXNOW: SARS Coronavirus 2 Ag: NEGATIVE

## 2021-10-19 MED ORDER — AZITHROMYCIN 250 MG PO TABS: ORAL_TABLET | ORAL | 0 refills | Status: DC

## 2021-10-19 MED ORDER — MUPIROCIN 2 % EX OINT
1.0000 | TOPICAL_OINTMENT | Freq: Three times a day (TID) | CUTANEOUS | 0 refills | Status: DC
Start: 2021-10-19 — End: 2021-12-25

## 2021-10-19 NOTE — Addendum Note (Signed)
Addended by: Wyvonne Lenz on: 10/19/2021 05:23 PM   Modules accepted: Orders

## 2021-10-19 NOTE — Progress Notes (Signed)
   Subjective:    Patient ID: Thomas [name], male    DOB: 03-21-1960, 61 y.o.   MRN: [id number]  HPI Here for 3 days of stuffy head, ST, raspy voice, and a dry cough. No fever or SOB. He has also had a tender bump in the right nostril the past few days. Drinking fluids.    Review of Systems  Constitutional: Negative.   HENT:  Positive for congestion, sore throat and voice change. Negative for ear pain, mouth sores, postnasal drip, sinus pain and trouble swallowing.   Eyes: Negative.   Respiratory:  Positive for cough. Negative for shortness of breath and wheezing.   Gastrointestinal: Negative.        Objective:   Physical Exam Constitutional:      Appearance: Normal appearance. He is not ill-appearing.  HENT:     Right Ear: Tympanic membrane, ear canal and external ear normal.     Left Ear: Tympanic membrane, ear canal and external ear normal.     Nose:     Comments: The floor of the right nostril has a small pustule that is tender     Mouth/Throat:     Pharynx: Posterior oropharyngeal erythema present. No oropharyngeal exudate.  Eyes:     Conjunctiva/sclera: Conjunctivae normal.  Cardiovascular:     Rate and Rhythm: Normal rate and regular rhythm.     Pulses: Normal pulses.     Heart sounds: Normal heart sounds.  Pulmonary:     Effort: Pulmonary effort is normal.     Breath sounds: Normal breath sounds.  Lymphadenopathy:     Cervical: No cervical adenopathy.  Neurological:     Mental Status: He is alert.           Assessment & Plan:  Sinusitis, treat with a Zpack. He also has a nasal pustule, so we will treat this with Mupiricin ointment TID.  Alysia Penna, MD

## 2021-10-20 ENCOUNTER — Telehealth (INDEPENDENT_AMBULATORY_CARE_PROVIDER_SITE_OTHER): Payer: BC Managed Care – PPO | Admitting: Family Medicine

## 2021-10-20 ENCOUNTER — Telehealth: Payer: Self-pay | Admitting: Family Medicine

## 2021-10-20 ENCOUNTER — Ambulatory Visit: Payer: BC Managed Care – PPO | Admitting: Bariatrics

## 2021-10-20 ENCOUNTER — Encounter: Payer: Self-pay | Admitting: Family Medicine

## 2021-10-20 DIAGNOSIS — R509 Fever, unspecified: Secondary | ICD-10-CM | POA: Diagnosis not present

## 2021-10-20 DIAGNOSIS — U071 COVID-19: Secondary | ICD-10-CM | POA: Diagnosis not present

## 2021-10-20 DIAGNOSIS — R059 Cough, unspecified: Secondary | ICD-10-CM | POA: Diagnosis not present

## 2021-10-20 LAB — POCT INFLUENZA A/B
Influenza A, POC: NEGATIVE
Influenza B, POC: NEGATIVE

## 2021-10-20 LAB — POC COVID19 BINAXNOW: SARS Coronavirus 2 Ag: POSITIVE — AB

## 2021-10-20 MED ORDER — HYDROCODONE BIT-HOMATROP MBR 5-1.5 MG/5ML PO SOLN: 5.0000 mL | ORAL | 0 refills | Status: DC | PRN

## 2021-10-20 MED ORDER — NIRMATRELVIR/RITONAVIR (PAXLOVID)TABLET: 3.0000 | ORAL_TABLET | Freq: Two times a day (BID) | ORAL | 0 refills | Status: AC

## 2021-10-20 NOTE — Telephone Encounter (Signed)
Please disregard

## 2021-10-20 NOTE — Progress Notes (Signed)
Subjective:    Patient ID: Thomas [name], male    DOB: Nov 09, 1960, 61 y.o.   MRN: [id number]  HPI Virtual Visit via Video Note  I connected with the patient on 10/20/21 at 10:15 AM EDT by a video enabled telemedicine application and verified that I am speaking with the correct person using two identifiers.  Location patient: home Location provider:work or home office Persons participating in the virtual visit: patient, provider  I discussed the limitations of evaluation and management by telemedicine and the availability of in person appointments. The patient expressed understanding and agreed to proceed.   HPI: Here for worsening symptoms. We saw him yesterday for 3 days of headache, body aches, stuffy head,and a dry cough. He tested negative for the Covid virus. He was started on a Zpack. Then last night and this morning he started feeling much worse, the dry cough is worse, and he has had a fever up to 103 degrees. Today he has tested positive for Covid.    ROS: See pertinent positives and negatives per HPI.  Past Medical History:  Diagnosis Date   Allergy    Asthma    Back pain    Diabetes ([place])    Dyspnea    GERD (gastroesophageal reflux disease)    History of hiatal hernia    Pneumonia    5 -7 yrs ago   SOB (shortness of breath)    Swallowing difficulty     Past Surgical History:  Procedure Laterality Date   ANTERIOR CERVICAL DECOMP/DISCECTOMY FUSION N/A 07/14/2016   Procedure: Cervical five-six5- Cervical six-seven Anterior cervical decompression/discectomy/fusion;  Surgeon: Erline Levine, MD;  Location[facility];  Service: Neurosurgery;  Laterality: N/A;   COLON SURGERY     COLONOSCOPY  12/22/2014   per Dr. Earlean Shawl, clear, repeat in 5 yrs (hx of adenomas)    DG THUMB LEFT HAND     DG THUMB RIGHT HAND ([place] HX)     FUNCTIONAL ENDOSCOPIC SINUS SURGERY     PARTIAL COLECTOMY     sigmoid, Dr. Earlean Shawl    Family History  Problem Relation Age of Onset   Cancer Mother     Cancer Father    Hyperlipidemia Father    Diabetes Other        maternal family     Current Outpatient Medications:    albuterol (PROAIR HFA) 108 (90 Base) MCG/ACT inhaler, INHALE 2 PUFFS EVERY 6 HOURS AS NEEDED FOR WHEEZING OR SHORTNESS OF BREATH., Disp: 8 g, Rfl: 1   albuterol (PROVENTIL) (2.5 MG/3ML) 0.083% nebulizer solution, Take 3 mLs (2.5 mg total) by nebulization every 4 (four) hours as needed for wheezing or shortness of breath., Disp: 75 mL, Rfl: 12   azithromycin (ZITHROMAX Z-PAK) 250 MG tablet, As directed, Disp: 6 each, Rfl: 0   budesonide-formoterol (SYMBICORT) 160-4.5 MCG/ACT inhaler, Inhale 2 puffs into the lungs 2 (two) times daily., Disp: 1 each, Rfl: 0   fluticasone (FLONASE) 50 MCG/ACT nasal spray, SPRAY 2 SPRAYS INTO EACH NOSTRIL EVERY DAY, Disp: 48 mL, Rfl: 0   ketoconazole (NIZORAL) 2 % cream, Apply 1 application topically 2 (two) times daily as needed for irritation., Disp: 45 g, Rfl: 5   lansoprazole (PREVACID) 15 MG capsule, Take 1 capsule (15 mg total) by mouth daily., Disp: 90 capsule, Rfl: 3   montelukast (SINGULAIR) 10 MG tablet, TAKE 1 TABLET BY MOUTH EVERY DAY, Disp: 90 tablet, Rfl: 3   mupirocin ointment (BACTROBAN) 2 %, Apply 1 Application topically 3 (three) times  daily., Disp: 22 g, Rfl: 0   Vitamin D, Ergocalciferol, (DRISDOL) 1.25 MG (50000 UNIT) CAPS capsule, Take 1 capsule (50,000 Units total) by mouth every 14 (fourteen) days., Disp: 4 capsule, Rfl: 0   HYDROcodone bit-homatropine (HYCODAN) 5-1.5 MG/5ML syrup, Take 5 mLs by mouth every 4 (four) hours as needed for cough., Disp: 240 mL, Rfl: 0   nirmatrelvir/ritonavir EUA (PAXLOVID) 20 x 150 MG & 10 x '100MG'$  TABS, Take 3 tablets by mouth 2 (two) times daily for 5 days. (Take nirmatrelvir 150 mg two tablets twice daily for 5 days and ritonavir 100 mg one tablet twice daily for 5 days) Patient GFR is 68, Disp: 30 tablet, Rfl: 0  EXAM:  VITALS per patient if applicable:  GENERAL: alert, oriented,  appears well and in no acute distress  HEENT: atraumatic, conjunttiva clear, no obvious abnormalities on inspection of external nose and ears  NECK: normal movements of the head and neck  LUNGS: on inspection no signs of respiratory distress, breathing rate appears normal, no obvious gross SOB, gasping or wheezing  CV: no obvious cyanosis  MS: moves all visible extremities without noticeable abnormality  PSYCH/NEURO: pleasant and cooperative, no obvious depression or anxiety, speech and thought processing grossly intact  ASSESSMENT AND PLAN: Covid infection. Treat with 5 days of Paxlovid. Use Hycodan for the cough. Recheck as needed. Alysia Penna, MD  Discussed the following assessment and plan:  Cough, unspecified type - Plan: POC COVID-19, POC Influenza A/B  Fever, unspecified fever cause - Plan: POC COVID-19, POC Influenza A/B     I discussed the assessment and treatment plan with the patient. The patient was provided an opportunity to ask questions and all were answered. The patient agreed with the plan and demonstrated an understanding of the instructions.   The patient was advised to call back or seek an in-person evaluation if the symptoms worsen or if the condition fails to improve as anticipated.      Review of Systems     Objective:   Physical Exam        Assessment & Plan:

## 2021-11-08 ENCOUNTER — Telehealth: Payer: Self-pay | Admitting: Family Medicine

## 2021-11-08 ENCOUNTER — Other Ambulatory Visit (INDEPENDENT_AMBULATORY_CARE_PROVIDER_SITE_OTHER): Payer: Self-pay | Admitting: Bariatrics

## 2021-11-08 DIAGNOSIS — E559 Vitamin D deficiency, unspecified: Secondary | ICD-10-CM

## 2021-11-08 MED ORDER — METHYLPREDNISOLONE 4 MG PO TBPK: ORAL_TABLET | ORAL | 0 refills | Status: DC

## 2021-11-08 NOTE — Telephone Encounter (Signed)
I sent in a Medrol dose pack (steroid taper) and he should take 1200 mg of Mucinex BID

## 2021-11-08 NOTE — Telephone Encounter (Signed)
Post recent covid diagnosis  pt is coughing up chalky phlegm. Requesting medication for treatment to help clear lungs

## 2021-11-08 NOTE — Addendum Note (Signed)
Addended by: Alysia Penna A on: 11/08/2021 05:31 PM   Modules accepted: Orders

## 2021-11-09 NOTE — Telephone Encounter (Signed)
Spoke with patient concerning message, voiced understanding

## 2021-12-06 ENCOUNTER — Encounter: Payer: Self-pay | Admitting: Pulmonary Disease

## 2021-12-06 ENCOUNTER — Ambulatory Visit: Payer: BC Managed Care – PPO | Admitting: Pulmonary Disease

## 2021-12-06 VITALS — BP 140/62 | HR 78 | Temp 98.4°F | Ht 71.0 in | Wt 237.2 lb

## 2021-12-06 DIAGNOSIS — J4540 Moderate persistent asthma, uncomplicated: Secondary | ICD-10-CM

## 2021-12-06 MED ORDER — METHYLPREDNISOLONE 4 MG PO TBPK: ORAL_TABLET | ORAL | 0 refills | Status: DC

## 2021-12-06 MED ORDER — FLUTICASONE FUROATE-VILANTEROL 200-25 MCG/ACT IN AEPB: 1.0000 | INHALATION_SPRAY | Freq: Every day | RESPIRATORY_TRACT | 2 refills | Status: DC

## 2021-12-06 MED ORDER — AZITHROMYCIN 250 MG PO TABS: ORAL_TABLET | ORAL | 0 refills | Status: DC

## 2021-12-06 MED ORDER — FLUTICASONE FUROATE-VILANTEROL 200-25 MCG/ACT IN AEPB: 1.0000 | INHALATION_SPRAY | Freq: Every day | RESPIRATORY_TRACT | 1 refills | Status: DC

## 2021-12-06 NOTE — Progress Notes (Signed)
AMUN STEMM    [id number]    October 19, 1960  Primary Care Physician:Fry, Ishmael Holter, MD  Referring Physician: Laurey Morale, MD [place],  [place] [address]  Chief complaint: Follow up for moderate persistent asthma, nasal polyposis, chronic sinusitis.  HPI: 61 y.o. with history of allergies, asthma, diabetes, GERD, dyspnea  Follows with Dr. 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   Underwent FEES in April 2019.   He was on aspirin for chronic pain but is not using it anymore.  Advised him to avoid aspirin and NSAIDs   He was originally maintained on Qvar and albuterol with poor control of symptoms. Started on Symbicort and Singulair in Oct 2019 with significant improvement in symptoms  Pets: Used to have a dog, no cats, birds, farm animals Occupation: Works as a Administrator for [employer], Theme park manager Exposures: No known exposures, mold mold, hot tub, Jacuzzi Smoking history: 10-pack-year smoker.  Quit in 1990 Travel history: Drives to [place] as a Administrator.  No other significant travel. Relevant family history: No significant family history of lung disease.  Interim history: Continues on Symbicort, Singulair Is received notification from his insurance that Symbicort is no longer covered and alternatives of Wixela or breo  He developed COVID-19 in early September.  Did not require hospitalization but since then has chest congestion, cough and wheezing.  He was given a Medrol Dosepak last month with temporary relief of symptoms.  Denies any fevers or chills.  Continues follow-up with Dr. Wilburn Cornelia from ENT for chronic rhinosinusitis.   Outpatient Encounter Medications as of 12/06/2021  Medication Sig   albuterol (PROAIR HFA) 108 (90 Base) MCG/ACT inhaler INHALE 2 PUFFS EVERY 6 HOURS AS NEEDED FOR WHEEZING OR SHORTNESS OF BREATH.   albuterol (PROVENTIL) (2.5 MG/3ML) 0.083% nebulizer solution Take 3 mLs (2.5 mg total) by  nebulization every 4 (four) hours as needed for wheezing or shortness of breath.   budesonide-formoterol (SYMBICORT) 160-4.5 MCG/ACT inhaler Inhale 2 puffs into the lungs 2 (two) times daily.   fluticasone (FLONASE) 50 MCG/ACT nasal spray SPRAY 2 SPRAYS INTO EACH NOSTRIL EVERY DAY   lansoprazole (PREVACID) 15 MG capsule Take 1 capsule (15 mg total) by mouth daily.   montelukast (SINGULAIR) 10 MG tablet TAKE 1 TABLET BY MOUTH EVERY DAY   mupirocin ointment (BACTROBAN) 2 % Apply 1 Application topically 3 (three) times daily.   Vitamin D, Ergocalciferol, (DRISDOL) 1.25 MG (50000 UNIT) CAPS capsule Take 1 capsule (50,000 Units total) by mouth every 14 (fourteen) days.   [DISCONTINUED] azithromycin (ZITHROMAX Z-PAK) 250 MG tablet As directed   [DISCONTINUED] HYDROcodone bit-homatropine (HYCODAN) 5-1.5 MG/5ML syrup Take 5 mLs by mouth every 4 (four) hours as needed for cough.   [DISCONTINUED] ketoconazole (NIZORAL) 2 % cream Apply 1 application topically 2 (two) times daily as needed for irritation.   [DISCONTINUED] methylPREDNISolone (MEDROL DOSEPAK) 4 MG TBPK tablet As directed   No facility-administered encounter medications on file as of 12/06/2021.   Physical Exam: Blood pressure (!) 140/62, pulse 78, temperature 98.4 F (36.9 C), temperature source Oral, height '5\' 11"'$  (1.803 m), weight 237 lb 3.2 oz (107.6 kg), SpO2 97 %. Gen:      No acute distress HEENT:  EOMI, sclera anicteric Neck:     No masses; no thyromegaly Lungs:    Clear to auscultation bilaterally; normal respiratory effort CV:         Regular rate and rhythm; no murmurs  Abd:      + bowel sounds; soft, non-tender; no palpable masses, no distension Ext:    No edema; adequate peripheral perfusion Skin:      Warm and dry; no rash Neuro: alert and oriented x 3 Psych: normal mood and affect   Data Reviewed: Imaging: Chest x-ray 08/12/2017- chronic bronchitic changes, no acute cardiopulmonary abnormalities.  I have reviewed the  images personally.  PFTs: 01/04/2018-FVC 5.11 [122%), FEV1 4.38 [133%), F/F 86, TLC 66%, TLC 114% Mild restriction  FENO 11/27/17- 217 ACQ6 11/27/17 - 3 ACT score 01/03/2019-21  Labs: CBC 10/23/2017-WBC 8.2, eos 6%, absolute eosinophil count 492 IgE 11/19/2017-229  Assessment:  Moderate persistent asthma His asthma is associated with nasal polyposis, chronic sinusitis, possible aspirin allergy, peripheral eosinophilia.  PFTs show mild restriction but there is no evidence of interstitial lung disease on chest x-ray.  Currently has exacerbation due to set off by COVID infection in September with persistent symptoms Treat with Z-Pak and Medrol Dosepak.  He would like to avoid a prolonged course of prednisone as it causes mood changes. Change Symbicort to Breo as Symbicort is no longer covered by insurance.  Continue Singulair Continue Flonase.  Use over-the-counter antihistamines  We discussed biologic therapy as he is a good candidate but since he has been stable prior to recent exacerbation after COVID he would like to avoid additional therapies for now.  Plan/Recommendations: - Z-Pak, Medrol - Change Symbicort to Breo, continue Singulair - Flonase, OTC antihistamine  Marshell Garfinkel MD [facility] 12/06/2021, 11:03 AM  CC: Laurey Morale, MD

## 2021-12-06 NOTE — Patient Instructions (Signed)
We will give you a Medrol Dosepak and Z-Pak for bronchitis Change Symbicort to Breo 200 since Symbicort is no longer covered by her insurance Continue Singulair. Follow-up in 1 to 2 months with me or nurse practitioner.

## 2021-12-13 ENCOUNTER — Ambulatory Visit (INDEPENDENT_AMBULATORY_CARE_PROVIDER_SITE_OTHER): Payer: BC Managed Care – PPO

## 2021-12-13 ENCOUNTER — Ambulatory Visit: Payer: BC Managed Care – PPO | Admitting: Family Medicine

## 2021-12-13 ENCOUNTER — Encounter: Payer: Self-pay | Admitting: Family Medicine

## 2021-12-13 VITALS — BP 110/72 | HR 87 | Temp 98.2°F | Wt 235.0 lb

## 2021-12-13 DIAGNOSIS — R051 Acute cough: Secondary | ICD-10-CM

## 2021-12-13 DIAGNOSIS — J189 Pneumonia, unspecified organism: Secondary | ICD-10-CM

## 2021-12-13 MED ORDER — AMOXICILLIN-POT CLAVULANATE 875-125 MG PO TABS: 1.0000 | ORAL_TABLET | Freq: Two times a day (BID) | ORAL | 0 refills | Status: DC

## 2021-12-13 NOTE — Progress Notes (Signed)
   Subjective:    Patient ID: Thomas [name], male    DOB: 1960-04-07, 61 y.o.   MRN: [id number]  HPI Here for a deep cough that produces yellow sputum and even some bright red blood on one occasion. He tested positive for the Covid virus on 10-20-21, and he has been coughing ever since. The cough had been dry for a month or so, but it became productive last week. No chest pain or fever. He does get SOB with it, but he has only used his nebulizer 2 or 3 times in the last few weeks. He saw his pulmonologist, Dr. Vaughan Browner, on 12-06-21 and he gave him a Zpack and a Medrol dose pack. These have not helped at all. He has been taking Mucinex 1200 mg BID for several weeks.    Review of Systems  Constitutional: Negative.   HENT:  Positive for congestion and postnasal drip. Negative for ear pain, sinus pain and sore throat.   Eyes: Negative.   Respiratory:  Positive for cough, shortness of breath and wheezing. Negative for choking.   Cardiovascular: Negative.        Objective:   Physical Exam Constitutional:      Appearance: Normal appearance. He is not ill-appearing.  HENT:     Right Ear: Tympanic membrane, ear canal and external ear normal.     Left Ear: Tympanic membrane, ear canal and external ear normal.     Nose: Nose normal.     Mouth/Throat:     Pharynx: Oropharynx is clear.  Eyes:     Conjunctiva/sclera: Conjunctivae normal.  Pulmonary:     Effort: Pulmonary effort is normal.     Comments: There are scattered wheezes throughout. There are rales with some consolidation in the RML  Lymphadenopathy:     Cervical: No cervical adenopathy.  Neurological:     Mental Status: He is alert.           Assessment & Plan:  RML pneumonia. We will treat this with 10 days of Augmentin. Get a CXR today. I told him he can use the nebulizer every 4 hours if he needs it.  Alysia Penna, MD

## 2021-12-23 ENCOUNTER — Inpatient Hospital Stay (HOSPITAL_COMMUNITY)
Admission: EM | Admit: 2021-12-23 | Discharge: 2021-12-27 | DRG: 195 | Disposition: A | Discharge status: Home / Self Care | Payer: BC Managed Care – PPO | Attending: Family Medicine | Admitting: Family Medicine

## 2021-12-23 ENCOUNTER — Emergency Department (HOSPITAL_COMMUNITY): Payer: BC Managed Care – PPO

## 2021-12-23 DIAGNOSIS — E119 Type 2 diabetes mellitus without complications: Secondary | ICD-10-CM | POA: Diagnosis present

## 2021-12-23 DIAGNOSIS — Z87891 Personal history of nicotine dependence: Secondary | ICD-10-CM

## 2021-12-23 DIAGNOSIS — J4540 Moderate persistent asthma, uncomplicated: Secondary | ICD-10-CM | POA: Diagnosis present

## 2021-12-23 DIAGNOSIS — R911 Solitary pulmonary nodule: Secondary | ICD-10-CM | POA: Diagnosis present

## 2021-12-23 DIAGNOSIS — Z9049 Acquired absence of other specified parts of digestive tract: Secondary | ICD-10-CM

## 2021-12-23 DIAGNOSIS — J45909 Unspecified asthma, uncomplicated: Secondary | ICD-10-CM | POA: Diagnosis present

## 2021-12-23 DIAGNOSIS — D721 Eosinophilia, unspecified: Secondary | ICD-10-CM | POA: Diagnosis present

## 2021-12-23 DIAGNOSIS — Z981 Arthrodesis status: Secondary | ICD-10-CM

## 2021-12-23 DIAGNOSIS — K219 Gastro-esophageal reflux disease without esophagitis: Secondary | ICD-10-CM | POA: Diagnosis present

## 2021-12-23 DIAGNOSIS — Z8616 Personal history of COVID-19: Secondary | ICD-10-CM

## 2021-12-23 DIAGNOSIS — Z6833 Body mass index (BMI) 33.0-33.9, adult: Secondary | ICD-10-CM

## 2021-12-23 DIAGNOSIS — Z6835 Body mass index (BMI) 35.0-35.9, adult: Secondary | ICD-10-CM | POA: Diagnosis present

## 2021-12-23 DIAGNOSIS — Z833 Family history of diabetes mellitus: Secondary | ICD-10-CM

## 2021-12-23 DIAGNOSIS — Z79899 Other long term (current) drug therapy: Secondary | ICD-10-CM

## 2021-12-23 DIAGNOSIS — Z6834 Body mass index (BMI) 34.0-34.9, adult: Secondary | ICD-10-CM | POA: Diagnosis present

## 2021-12-23 DIAGNOSIS — Z7951 Long term (current) use of inhaled steroids: Secondary | ICD-10-CM

## 2021-12-23 DIAGNOSIS — Z1152 Encounter for screening for COVID-19: Secondary | ICD-10-CM

## 2021-12-23 DIAGNOSIS — A419 Sepsis, unspecified organism: Principal | ICD-10-CM

## 2021-12-23 DIAGNOSIS — Z83438 Family history of other disorder of lipoprotein metabolism and other lipidemia: Secondary | ICD-10-CM

## 2021-12-23 DIAGNOSIS — J189 Pneumonia, unspecified organism: Secondary | ICD-10-CM | POA: Diagnosis present

## 2021-12-23 DIAGNOSIS — J181 Lobar pneumonia, unspecified organism: Secondary | ICD-10-CM | POA: Diagnosis not present

## 2021-12-23 DIAGNOSIS — E669 Obesity, unspecified: Secondary | ICD-10-CM | POA: Diagnosis present

## 2021-12-23 HISTORY — DX: Nasal polyp, unspecified: J33.9

## 2021-12-23 HISTORY — DX: COVID-19: U07.1

## 2021-12-23 MED ORDER — VANCOMYCIN HCL 2000 MG/400ML IV SOLN
2000.0000 mg | Freq: Once | INTRAVENOUS | Status: AC
  Administered 2021-12-24: 2000 mg via INTRAVENOUS
  Filled 2021-12-23: qty 400

## 2021-12-23 MED ORDER — SODIUM CHLORIDE 0.9 % IV SOLN
2.0000 g | Freq: Once | INTRAVENOUS | Status: AC
  Administered 2021-12-24: 2 g via INTRAVENOUS
  Filled 2021-12-23: qty 12.5

## 2021-12-23 MED ORDER — METRONIDAZOLE 500 MG/100ML IV SOLN
500.0000 mg | Freq: Once | INTRAVENOUS | Status: AC
  Administered 2021-12-24: 500 mg via INTRAVENOUS
  Filled 2021-12-23: qty 100

## 2021-12-23 MED ORDER — LACTATED RINGERS IV SOLN: INTRAVENOUS | Status: DC

## 2021-12-23 MED ORDER — VANCOMYCIN HCL IN DEXTROSE 1-5 GM/200ML-% IV SOLN: 1000.0000 mg | Freq: Once | INTRAVENOUS | Status: DC

## 2021-12-23 NOTE — Progress Notes (Signed)
A consult was received from an ED physician for Vancomycin and Cefepime per pharmacy dosing.  The patient's profile has been reviewed for ht/wt/allergies/indication/available labs.    A one time order has been placed for Vancomycin 2gm IV and Cefepime 2gm IV.    Further antibiotics/pharmacy consults should be ordered by admitting physician if indicated.                       Thank you, Everette Rank, PharmD 12/23/2021  11:52 PM

## 2021-12-24 ENCOUNTER — Other Ambulatory Visit: Payer: Self-pay

## 2021-12-24 ENCOUNTER — Emergency Department (HOSPITAL_COMMUNITY): Payer: BC Managed Care – PPO

## 2021-12-24 ENCOUNTER — Encounter (HOSPITAL_COMMUNITY): Payer: Self-pay | Admitting: Internal Medicine

## 2021-12-24 DIAGNOSIS — E119 Type 2 diabetes mellitus without complications: Secondary | ICD-10-CM

## 2021-12-24 DIAGNOSIS — J181 Lobar pneumonia, unspecified organism: Secondary | ICD-10-CM | POA: Diagnosis present

## 2021-12-24 DIAGNOSIS — Z8616 Personal history of COVID-19: Secondary | ICD-10-CM | POA: Diagnosis not present

## 2021-12-24 DIAGNOSIS — Z833 Family history of diabetes mellitus: Secondary | ICD-10-CM | POA: Diagnosis not present

## 2021-12-24 DIAGNOSIS — R911 Solitary pulmonary nodule: Secondary | ICD-10-CM | POA: Diagnosis present

## 2021-12-24 DIAGNOSIS — E669 Obesity, unspecified: Secondary | ICD-10-CM | POA: Diagnosis present

## 2021-12-24 DIAGNOSIS — J4540 Moderate persistent asthma, uncomplicated: Secondary | ICD-10-CM | POA: Diagnosis present

## 2021-12-24 DIAGNOSIS — Z1152 Encounter for screening for COVID-19: Secondary | ICD-10-CM | POA: Diagnosis not present

## 2021-12-24 DIAGNOSIS — Z981 Arthrodesis status: Secondary | ICD-10-CM | POA: Diagnosis not present

## 2021-12-24 DIAGNOSIS — Z79899 Other long term (current) drug therapy: Secondary | ICD-10-CM | POA: Diagnosis not present

## 2021-12-24 DIAGNOSIS — Z9049 Acquired absence of other specified parts of digestive tract: Secondary | ICD-10-CM | POA: Diagnosis not present

## 2021-12-24 DIAGNOSIS — K219 Gastro-esophageal reflux disease without esophagitis: Secondary | ICD-10-CM | POA: Diagnosis present

## 2021-12-24 DIAGNOSIS — A419 Sepsis, unspecified organism: Secondary | ICD-10-CM

## 2021-12-24 DIAGNOSIS — Z6833 Body mass index (BMI) 33.0-33.9, adult: Secondary | ICD-10-CM | POA: Diagnosis not present

## 2021-12-24 DIAGNOSIS — Z7951 Long term (current) use of inhaled steroids: Secondary | ICD-10-CM | POA: Diagnosis not present

## 2021-12-24 DIAGNOSIS — R6520 Severe sepsis without septic shock: Secondary | ICD-10-CM

## 2021-12-24 DIAGNOSIS — D721 Eosinophilia, unspecified: Secondary | ICD-10-CM | POA: Diagnosis present

## 2021-12-24 DIAGNOSIS — Z83438 Family history of other disorder of lipoprotein metabolism and other lipidemia: Secondary | ICD-10-CM | POA: Diagnosis not present

## 2021-12-24 DIAGNOSIS — J189 Pneumonia, unspecified organism: Secondary | ICD-10-CM | POA: Diagnosis present

## 2021-12-24 DIAGNOSIS — Z87891 Personal history of nicotine dependence: Secondary | ICD-10-CM | POA: Diagnosis not present

## 2021-12-24 LAB — CBC WITH DIFFERENTIAL/PLATELET
Abs Immature Granulocytes: 0.06 10*3/uL (ref 0.00–0.07)
Basophils Absolute: 0.1 10*3/uL (ref 0.0–0.1)
Basophils Relative: 1 %
Eosinophils Absolute: 0.1 10*3/uL (ref 0.0–0.5)
Eosinophils Relative: 1 %
HCT: 37.6 % — ABNORMAL LOW (ref 39.0–52.0)
Hemoglobin: 13 g/dL (ref 13.0–17.0)
Immature Granulocytes: 1 %
Lymphocytes Relative: 6 %
Lymphs Abs: 0.7 10*3/uL (ref 0.7–4.0)
MCH: 31.9 pg (ref 26.0–34.0)
MCHC: 34.6 g/dL (ref 30.0–36.0)
MCV: 92.2 fL (ref 80.0–100.0)
Monocytes Absolute: 0.9 10*3/uL (ref 0.1–1.0)
Monocytes Relative: 7 %
Neutro Abs: 10.5 10*3/uL — ABNORMAL HIGH (ref 1.7–7.7)
Neutrophils Relative %: 84 %
Platelets: 263 10*3/uL (ref 150–400)
RBC: 4.08 MIL/uL — ABNORMAL LOW (ref 4.22–5.81)
RDW: 12.5 % (ref 11.5–15.5)
WBC: 12.3 10*3/uL — ABNORMAL HIGH (ref 4.0–10.5)
nRBC: 0 % (ref 0.0–0.2)

## 2021-12-24 LAB — LACTIC ACID, PLASMA
Lactic Acid, Venous: 1.6 mmol/L (ref 0.5–1.9)
Lactic Acid, Venous: 2.2 mmol/L (ref 0.5–1.9)

## 2021-12-24 LAB — URINALYSIS, ROUTINE W REFLEX MICROSCOPIC
Bacteria, UA: NONE SEEN
Bilirubin Urine: NEGATIVE
Glucose, UA: 50 mg/dL — AB
Hgb urine dipstick: NEGATIVE
Ketones, ur: 5 mg/dL — AB
Leukocytes,Ua: NEGATIVE
Nitrite: NEGATIVE
Protein, ur: NEGATIVE mg/dL
Specific Gravity, Urine: 1.034 — ABNORMAL HIGH (ref 1.005–1.030)
pH: 5 (ref 5.0–8.0)

## 2021-12-24 LAB — GLUCOSE, CAPILLARY
Glucose-Capillary: 115 mg/dL — ABNORMAL HIGH (ref 70–99)
Glucose-Capillary: 164 mg/dL — ABNORMAL HIGH (ref 70–99)
Glucose-Capillary: 183 mg/dL — ABNORMAL HIGH (ref 70–99)

## 2021-12-24 LAB — COMPREHENSIVE METABOLIC PANEL
ALT: 29 U/L (ref 0–44)
AST: 26 U/L (ref 15–41)
Albumin: 4.2 g/dL (ref 3.5–5.0)
Alkaline Phosphatase: 67 U/L (ref 38–126)
Anion gap: 8 (ref 5–15)
BUN: 20 mg/dL (ref 8–23)
CO2: 21 mmol/L — ABNORMAL LOW (ref 22–32)
Calcium: 8.9 mg/dL (ref 8.9–10.3)
Chloride: 107 mmol/L (ref 98–111)
Creatinine, Ser: 1.26 mg/dL — ABNORMAL HIGH (ref 0.61–1.24)
GFR, Estimated: >60 mL/min (ref >60)
Glucose, Bld: 220 mg/dL — ABNORMAL HIGH (ref 70–99)
Potassium: 3.8 mmol/L (ref 3.5–5.1)
Sodium: 136 mmol/L (ref 135–145)
Total Bilirubin: 0.6 mg/dL (ref 0.3–1.2)
Total Protein: 7 g/dL (ref 6.5–8.1)

## 2021-12-24 LAB — C-REACTIVE PROTEIN: CRP: 13.8 mg/dL — ABNORMAL HIGH (ref <1.0)

## 2021-12-24 LAB — RESPIRATORY PANEL BY PCR

## 2021-12-24 LAB — STREP PNEUMONIAE URINARY ANTIGEN: Strep Pneumo Urinary Antigen: NEGATIVE

## 2021-12-24 LAB — RESP PANEL BY RT-PCR (FLU A&B, COVID) ARPGX2
Influenza A by PCR: NEGATIVE
Influenza B by PCR: NEGATIVE
SARS Coronavirus 2 by RT PCR: NEGATIVE

## 2021-12-24 LAB — PROTIME-INR
INR: 1 (ref 0.8–1.2)
Prothrombin Time: 13 seconds (ref 11.4–15.2)

## 2021-12-24 LAB — EXPECTORATED SPUTUM ASSESSMENT W GRAM STAIN, RFLX TO RESP C

## 2021-12-24 LAB — SEDIMENTATION RATE: Sed Rate: 30 mm/hr — ABNORMAL HIGH (ref 0–16)

## 2021-12-24 LAB — APTT: aPTT: 22 seconds — ABNORMAL LOW (ref 24–36)

## 2021-12-24 MED ORDER — IOHEXOL 350 MG/ML SOLN
75.0000 mL | Freq: Once | INTRAVENOUS | Status: AC | PRN
  Administered 2021-12-24: 75 mL via INTRAVENOUS

## 2021-12-24 MED ORDER — ACETAMINOPHEN 650 MG RE SUPP: 650.0000 mg | Freq: Four times a day (QID) | RECTAL | Status: DC | PRN

## 2021-12-24 MED ORDER — INSULIN ASPART 100 UNIT/ML IJ SOLN: 0.0000 [IU] | Freq: Three times a day (TID) | INTRAMUSCULAR | Status: DC

## 2021-12-24 MED ORDER — SODIUM CHLORIDE 0.9 % IV SOLN
2.0000 g | INTRAVENOUS | Status: DC
  Administered 2021-12-24 – 2021-12-27 (×4): 2 g via INTRAVENOUS
  Filled 2021-12-24 (×4): qty 20

## 2021-12-24 MED ORDER — OXYCODONE HCL 5 MG PO TABS
5.0000 mg | ORAL_TABLET | Freq: Once | ORAL | Status: AC
  Administered 2021-12-24: 5 mg via ORAL
  Filled 2021-12-24: qty 1

## 2021-12-24 MED ORDER — ONDANSETRON HCL 4 MG PO TABS: 4.0000 mg | ORAL_TABLET | Freq: Four times a day (QID) | ORAL | Status: DC | PRN

## 2021-12-24 MED ORDER — ONDANSETRON HCL 4 MG/2ML IJ SOLN: 4.0000 mg | Freq: Four times a day (QID) | INTRAMUSCULAR | Status: DC | PRN

## 2021-12-24 MED ORDER — MELATONIN 5 MG PO TABS
10.0000 mg | ORAL_TABLET | Freq: Every evening | ORAL | Status: DC | PRN
  Administered 2021-12-25: 10 mg via ORAL
  Filled 2021-12-24: qty 2

## 2021-12-24 MED ORDER — ACETAMINOPHEN 500 MG PO TABS
1000.0000 mg | ORAL_TABLET | Freq: Once | ORAL | Status: AC
  Administered 2021-12-24: 1000 mg via ORAL
  Filled 2021-12-24: qty 2

## 2021-12-24 MED ORDER — GUAIFENESIN-DM 100-10 MG/5ML PO SYRP
10.0000 mL | ORAL_SOLUTION | ORAL | Status: AC | PRN
  Administered 2021-12-24 – 2021-12-25 (×3): 10 mL via ORAL
  Filled 2021-12-24 (×3): qty 10

## 2021-12-24 MED ORDER — FLUTICASONE FUROATE-VILANTEROL 200-25 MCG/ACT IN AEPB
1.0000 | INHALATION_SPRAY | Freq: Every day | RESPIRATORY_TRACT | Status: DC
  Filled 2021-12-24: qty 28

## 2021-12-24 MED ORDER — LACTATED RINGERS IV SOLN: INTRAVENOUS | Status: AC

## 2021-12-24 MED ORDER — MOMETASONE FURO-FORMOTEROL FUM 200-5 MCG/ACT IN AERO
2.0000 | INHALATION_SPRAY | Freq: Two times a day (BID) | RESPIRATORY_TRACT | Status: DC
  Administered 2021-12-24 – 2021-12-25 (×2): 2 via RESPIRATORY_TRACT
  Filled 2021-12-24: qty 8.8

## 2021-12-24 MED ORDER — IPRATROPIUM-ALBUTEROL 0.5-2.5 (3) MG/3ML IN SOLN
3.0000 mL | Freq: Four times a day (QID) | RESPIRATORY_TRACT | Status: DC
  Administered 2021-12-24 – 2021-12-27 (×11): 3 mL via RESPIRATORY_TRACT
  Filled 2021-12-24 (×12): qty 3

## 2021-12-24 MED ORDER — ACETAMINOPHEN 325 MG PO TABS
650.0000 mg | ORAL_TABLET | Freq: Four times a day (QID) | ORAL | Status: DC | PRN
  Administered 2021-12-24 – 2021-12-27 (×8): 650 mg via ORAL
  Filled 2021-12-24 (×8): qty 2

## 2021-12-24 MED ORDER — MONTELUKAST SODIUM 10 MG PO TABS
10.0000 mg | ORAL_TABLET | Freq: Every day | ORAL | Status: DC
  Administered 2021-12-24 – 2021-12-27 (×4): 10 mg via ORAL
  Filled 2021-12-24 (×4): qty 1

## 2021-12-24 MED ORDER — LACTATED RINGERS IV BOLUS
1000.0000 mL | Freq: Once | INTRAVENOUS | Status: AC
  Administered 2021-12-24: 1000 mL via INTRAVENOUS

## 2021-12-24 MED ORDER — PANTOPRAZOLE SODIUM 40 MG PO TBEC
40.0000 mg | DELAYED_RELEASE_TABLET | Freq: Every day | ORAL | Status: DC
  Administered 2021-12-24 – 2021-12-27 (×4): 40 mg via ORAL
  Filled 2021-12-24 (×4): qty 1

## 2021-12-24 MED ORDER — INSULIN ASPART 100 UNIT/ML IJ SOLN: 0.0000 [IU] | Freq: Every day | INTRAMUSCULAR | Status: DC

## 2021-12-24 MED ORDER — ENOXAPARIN SODIUM 40 MG/0.4ML IJ SOSY
40.0000 mg | PREFILLED_SYRINGE | INTRAMUSCULAR | Status: DC
  Administered 2021-12-24 – 2021-12-26 (×3): 40 mg via SUBCUTANEOUS
  Filled 2021-12-24 (×3): qty 0.4

## 2021-12-24 NOTE — Progress Notes (Signed)
This is a no charge note.  For further details, please see the H&P by my partner Dr. Bridgett Larsson from earlier today.  Non-resolving pneumonia, failed outpatient treatment.  Continue antibiotics.  Consult pulmonology to broaden differential given failure of treatment.

## 2021-12-24 NOTE — TOC Initial Note (Signed)
Transition of Care [facility]) - Initial/Assessment Note    Patient Details  Name: Thomas [name] MRN: [id number] Date of Birth: [birth year]  Transition of Care [facility]) CM/SW Contact:    Leeroy Cha, RN Phone Number: 12/24/2021, 9:16 AM  Clinical Narrative:                  Transition of Care [facility]) Screening Note   Patient Details  Name: Thomas [name] Date of Birth: [birth year]   Transition of Care [facility]) CM/SW Contact:    Leeroy Cha, RN Phone Number: 12/24/2021, 9:16 AM    Transition of Care Department [facility]) has reviewed patient and no TOC needs have been identified at this time. We will continue to monitor patient advancement through interdisciplinary progression rounds. If new patient transition needs arise, please place a TOC consult.    Expected Discharge Plan: Home/Self Care     Patient Goals and CMS Choice Patient states their goals for this hospitalization and ongoing recovery are:: to go home CMS Medicare.gov Compare Post Acute Care list provided to:: Patient Choice offered to / list presented to : Patient  Expected Discharge Plan and Services Expected Discharge Plan: Home/Self Care   Discharge Planning Services: CM Consult   Living arrangements for the past 2 months: Single Family Home                                      Prior Living Arrangements/Services Living arrangements for the past 2 months: Single Family Home Lives with:: Spouse   Do you feel safe going back to the place where you live?: Yes      Need for Family Participation in Patient Care: Yes (Comment) (wife is legal guardian)     Criminal Activity/Legal Involvement Pertinent to Current Situation/Hospitalization: No - Comment as needed  Activities of Daily Living Home Assistive Devices/Equipment: None ADL Screening (condition at time of admission) Patient's cognitive ability adequate to safely complete daily activities?: Yes Is the patient deaf or have difficulty  hearing?: No Does the patient have difficulty seeing, even when wearing glasses/contacts?: No Does the patient have difficulty concentrating, remembering, or making decisions?: Yes Patient able to express need for assistance with ADLs?: Yes Does the patient have difficulty dressing or bathing?: No Independently performs ADLs?: Yes (appropriate for developmental age) Does the patient have difficulty walking or climbing stairs?: No Weakness of Legs: None Weakness of Arms/Hands: None  Permission Sought/Granted                  Emotional Assessment Appearance:: Appears stated age Attitude/Demeanor/Rapport: Engaged Affect (typically observed): Calm Orientation: : Oriented to Self, Oriented to Place, Oriented to  Time, Oriented to Situation Alcohol / Substance Use: Tobacco Use, Alcohol Use (former smoker current etoh occasional use) Psych Involvement: No (comment)  Admission diagnosis:  Lobar pneumonia ([place]) [J18.1] Pneumonia due to infectious organism, unspecified laterality, unspecified part of lung [J18.9] Sepsis, due to unspecified organism, unspecified whether acute organ dysfunction present [facility]) [A41.9] Patient Active Problem List   Diagnosis Date Noted   Lobar pneumonia ([place]) 12/24/2021   Diabetes mellitus type 2 in obese ([place]) 09/21/2021   Hyperlipidemia 09/21/2021   Class 2 severe obesity with serious comorbidity and body mass index (BMI) of 35.0 to 35.9 in adult [facility]) 09/21/2021   Asthma 12/29/2020   Arthralgia of left temporomandibular joint 06/09/2020   Vitamin D deficiency 04/17/2018   Class 1  obesity with serious comorbidity and body mass index (BMI) of 32.0 to 32.9 in adult 12/21/2017   Type 2 diabetes mellitus without complications ([place]) 22/97/9892   Chronic sinusitis 08/30/2016   Nasal polyposis 08/30/2016   Herniated cervical disc without myelopathy 07/14/2016   Meralgia paresthetica of right side 07/13/2015   Obesity (BMI 30-39.9) 09/19/2012   GERD  09/09/2009   ABDOMINAL PAIN 09/09/2009   PCP:  Laurey Morale, MD Pharmacy:   [place], [place] [address] Phone: [phone] Fax: [phone]  [id number] [place] [id number]: [phone]: [phone]    Social Determinants of Health (SDOH) Interventions    Readmission Risk Interventions   No data to display

## 2021-12-24 NOTE — ED Provider Notes (Signed)
[facility] DEPT Provider Note   CSN: [id number] Arrival date & time: 12/23/21  2337     History  Chief Complaint  Patient presents with   Fever   Generalized Body Aches    Thomas [name] is a 61 y.o. male.  The history is provided by the patient.  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 Patient has completed his course of Augmentin.      Past Medical History:  Diagnosis Date   Allergy    Asthma    Back pain    Diabetes (HCC)    Dyspnea    GERD (gastroesophageal reflux disease)    History of hiatal hernia    Pneumonia    5 -7 yrs ago   SOB (shortness of breath)    Swallowing difficulty     Home Medications Prior to Admission medications   Medication Sig Start Date End Date Taking? Authorizing Provider  albuterol (PROAIR HFA) 108 (90 Base) MCG/ACT inhaler INHALE 2 PUFFS EVERY 6 HOURS AS NEEDED FOR WHEEZING OR SHORTNESS OF BREATH. 03/30/21  Yes Laurey Morale, MD  albuterol (PROVENTIL) (2.5 MG/3ML) 0.083% nebulizer solution Take 3 mLs (2.5 mg total) by nebulization every 4 (four) hours as needed for wheezing or shortness of breath. 03/30/21  Yes Laurey Morale, MD  fluticasone [facility]) 50 MCG/ACT nasal spray SPRAY 2 SPRAYS INTO EACH NOSTRIL EVERY DAY 10/11/21  Yes Laurey Morale, MD  lansoprazole (PREVACID) 15 MG capsule Take 1 capsule (15 mg total) by mouth daily. 07/24/19  Yes Laurey Morale, MD  montelukast (SINGULAIR) 10 MG tablet TAKE 1 TABLET BY MOUTH EVERY DAY 03/30/21  Yes Mannam, Praveen, MD  SYMBICORT 160-4.5 MCG/ACT inhaler  Inhale 2 puffs into the lungs in the morning and at bedtime. 12/10/21  Yes [provider]  Vitamin D, Ergocalciferol, (DRISDOL) 1.25 MG (50000 UNIT) CAPS capsule Take 1 capsule (50,000 Units total) by mouth every 14 (fourteen) days. 09/21/21  Yes Jearld Lesch A, DO  amoxicillin-clavulanate (AUGMENTIN) 875-125 MG tablet Take 1 tablet by mouth 2 (two) times daily. Patient not taking: Reported on 12/24/2021 12/13/21   Laurey Morale, MD  fluticasone furoate-vilanterol (BREO ELLIPTA) 200-25 MCG/ACT AEPB Inhale 1 puff into the lungs daily. 12/06/21   Mannam, Hart Robinsons, MD  mupirocin ointment (BACTROBAN) 2 % Apply 1 Application topically 3 (three) times daily. Patient not taking: Reported on 12/24/2021 10/19/21   Laurey Morale, MD      Allergies    No known allergies    Review of Systems   Review of Systems  Constitutional:  Positive for fever.  HENT:  Negative for facial swelling.   Eyes:  Negative for redness.  Cardiovascular:  Negative for leg swelling.  Gastrointestinal:  Negative for diarrhea and vomiting.  Skin:  Negative for rash.  Psychiatric/Behavioral:  Negative for confusion.     Physical Exam Updated Vital Signs BP (!) 133/91 (BP Location: Right Arm)   Pulse (!) 108   Temp (!) 101.1 F (38.4 C) (Oral)   Resp (!) 22   SpO2 94%  Physical Exam Vitals and nursing note reviewed. Exam conducted with a chaperone present.  Constitutional:      General: He is not in acute distress.    Appearance: He is well-developed. He is not diaphoretic.  HENT:     Head: Normocephalic and atraumatic.     Nose: No rhinorrhea.  Eyes:     Conjunctiva/sclera: Conjunctivae normal.     Pupils: Pupils are equal, round, and reactive to light.  Cardiovascular:     Rate and Rhythm: Regular rhythm. Tachycardia present.  Pulmonary:     Effort: Pulmonary effort is normal.     Breath sounds: Decreased breath sounds present. No wheezing or rales.  Abdominal:     General: Bowel sounds are  normal.     Palpations: Abdomen is soft.     Tenderness: There is no abdominal tenderness. There is no guarding or rebound.  Musculoskeletal:        General: Normal range of motion.     Cervical back: Normal range of motion and neck supple.  Skin:    General: Skin is warm and dry.     Capillary Refill: Capillary refill takes less than 2 seconds.  Neurological:     General: No focal deficit present.     Mental Status: He is alert and oriented to person, place, and time.     Deep Tendon Reflexes: Reflexes normal.  Psychiatric:        Mood and Affect: Mood normal.        Behavior: Behavior normal.     ED Results / Procedures / Treatments   Labs (all labs ordered are listed, but only abnormal results are displayed) Results for orders placed or performed during the hospital encounter of 12/23/21  Resp Panel by RT-PCR (Flu A&B, Covid) Anterior Nasal Swab   Specimen: Anterior Nasal Swab  Result Value Ref Range   SARS Coronavirus 2 by RT PCR NEGATIVE NEGATIVE   Influenza A by PCR NEGATIVE NEGATIVE   Influenza B by PCR NEGATIVE NEGATIVE  Lactic acid, plasma  Result Value Ref Range   Lactic Acid, Venous 2.2 (HH) 0.5 - 1.9 mmol/L  Comprehensive metabolic panel  Result Value Ref Range   Sodium 136 135 - 145 mmol/L   Potassium 3.8 3.5 - 5.1 mmol/L   Chloride 107 98 - 111 mmol/L   CO2 21 (L) 22 - 32 mmol/L   Glucose, Bld 220 (H) 70 - 99 mg/dL   BUN 20 8 - 23 mg/dL   Creatinine, Ser 1.26 (H) 0.61 - 1.24 mg/dL   Calcium 8.9 8.9 - 10.3 mg/dL   Total Protein 7.0 6.5 - 8.1 g/dL   Albumin 4.2 3.5 - 5.0 g/dL   AST 26 15 - 41 U/L   ALT 29 0 - 44 U/L   Alkaline Phosphatase 67 38 - 126 U/L   Total Bilirubin 0.6 0.3 - 1.2 mg/dL   GFR, Estimated >60 >60 mL/min   Anion gap 8 5 - 15  CBC with Differential  Result Value Ref Range   WBC 12.3 (H) 4.0 - 10.5 K/uL   RBC 4.08 (L) 4.22 - 5.81 MIL/uL   Hemoglobin 13.0 13.0 - 17.0 g/dL   HCT 37.6 (L) 39.0 - 52.0 %   MCV 92.2 80.0 - 100.0 fL   MCH  31.9 26.0 - 34.0 pg   MCHC 34.6 30.0 - 36.0 g/dL   RDW 12.5 11.5 - 15.5 %   Platelets 263 150 - 400 K/uL   nRBC 0.0 0.0 -  0.2 %   Neutrophils Relative % 84 %   Neutro Abs 10.5 (H) 1.7 - 7.7 K/uL   Lymphocytes Relative 6 %   Lymphs Abs 0.7 0.7 - 4.0 K/uL   Monocytes Relative 7 %   Monocytes Absolute 0.9 0.1 - 1.0 K/uL   Eosinophils Relative 1 %   Eosinophils Absolute 0.1 0.0 - 0.5 K/uL   Basophils Relative 1 %   Basophils Absolute 0.1 0.0 - 0.1 K/uL   Immature Granulocytes 1 %   Abs Immature Granulocytes 0.06 0.00 - 0.07 K/uL  Protime-INR  Result Value Ref Range   Prothrombin Time 13.0 11.4 - 15.2 seconds   INR 1.0 0.8 - 1.2  APTT  Result Value Ref Range   aPTT 22 (L) 24 - 36 seconds   DG Chest Port 1 View  Result Date: 12/24/2021 CLINICAL DATA:  Possible sepsis.  Cold and flu symptoms for 3 days. EXAM: PORTABLE CHEST 1 VIEW COMPARISON:  12/13/2021. FINDINGS: The heart size and mediastinal contours are within normal limits. Minimal subsegmental atelectasis or scarring at the left lung base. No effusion or pneumothorax. Cervical spinal fusion hardware is noted. Degenerative changes are present thoracic spine. IMPRESSION: No active disease. Electronically Signed   By: Brett Fairy M.D.   On: 12/24/2021 00:07   DG Chest 2 View  Result Date: 12/14/2021 CLINICAL DATA:  Cough.  Right middle lobe rales. EXAM: CHEST - 2 VIEW COMPARISON:  08/12/2017 FINDINGS: The examination is limited by grid lines. Normal sized heart. Moderate peribronchial thickening. Clear lungs. Thoracic spine degenerative changes. Cervical spine fixation hardware. IMPRESSION: Moderate bronchitic changes. Electronically Signed   By: Claudie Revering M.D.   On: 12/14/2021 20:59     EKG EKG Interpretation  Date/Time:  Friday December 24 2021 01:28:18 EST Ventricular Rate:  98 PR Interval:  132 QRS Duration: 83 QT Interval:  337 QTC Calculation: 431 R Axis:   68 Text Interpretation: Sinus rhythm Confirmed by  Dory Horn) on 12/24/2021 1:34:31 AM  Radiology DG Chest Port 1 View  Result Date: 12/24/2021 CLINICAL DATA:  Possible sepsis.  Cold and flu symptoms for 3 days. EXAM: PORTABLE CHEST 1 VIEW COMPARISON:  12/13/2021. FINDINGS: The heart size and mediastinal contours are within normal limits. Minimal subsegmental atelectasis or scarring at the left lung base. No effusion or pneumothorax. Cervical spinal fusion hardware is noted. Degenerative changes are present thoracic spine. IMPRESSION: No active disease. Electronically Signed   By: Brett Fairy M.D.   On: 12/24/2021 00:07    Procedures Procedures    Medications Ordered in ED Medications  lactated ringers infusion ( Intravenous New Bag/Given 12/24/21 0005)  vancomycin (VANCOREADY) IVPB 2000 mg/400 mL (2,000 mg Intravenous New Bag/Given 12/24/21 0132)  ceFEPIme (MAXIPIME) 2 g in sodium chloride 0.9 % 100 mL IVPB (0 g Intravenous Stopped 12/24/21 0041)  metroNIDAZOLE (FLAGYL) IVPB 500 mg (0 mg Intravenous Stopped 12/24/21 0130)  lactated ringers bolus 1,000 mL (1,000 mLs Intravenous New Bag/Given 12/24/21 0124)    ED Course/ Medical Decision Making/ A&P                           Medical Decision Making Patient presents as a sepsis patient from home with fevers to 104  Amount and/or Complexity of Data Reviewed Independent Historian: EMS    Details: See above  External Data Reviewed: notes.    Details: Previous notes reviewed  Labs: ordered.    Details: All labs reviewed:  elevated lactate 2.2, elevated hemoglobin 12.3, normal hemoglobin and platelet count.  Normal coagulation studies.  Negative covid and flu.  Normal sodium 136, normal potassium 3.8, creatinine elevated 1.26 Radiology: ordered and independent interpretation performed.    Details: No PNA on CXR ECG/medicine tests: ordered and independent interpretation performed. Decision-making details documented in ED Course.  Risk Prescription drug  management. Decision regarding hospitalization.  Critical Care Total time providing critical care: 30 minutes (Sepsis bundle and boluses )   CRITICAL CARE Performed by: Mailyn Steichen K Kern Gingras-Rasch Total critical care time: 30 minutes Critical care time was exclusive of separately billable procedures and treating other patients. Critical care was necessary to treat or prevent imminent or life-threatening deterioration. Critical care was time spent personally by me on the following activities: development of treatment plan with patient and/or surrogate as well as nursing, discussions with consultants, evaluation of patient's response to treatment, examination of patient, obtaining history from patient or surrogate, ordering and performing treatments and interventions, ordering and review of laboratory studies, ordering and review of radiographic studies, pulse oximetry and re-evaluation of patient's condition.  Final Clinical Impression(s) / ED Diagnoses Final diagnoses:  Sepsis, due to unspecified organism, unspecified whether acute organ dysfunction present [facility])   The patient appears reasonably stabilized for admission considering the current resources, flow, and capabilities available in the ED at this time, and I doubt any other [facility] requiring further screening and/or treatment in the ED prior to admission.  Rx / DC Orders ED Discharge Orders     None         Yadira Hada, MD 12/24/21 0151

## 2021-12-24 NOTE — Sepsis Progress Note (Signed)
Following for sepsis monitoring ?

## 2021-12-24 NOTE — Consult Note (Addendum)
NAME:  Thomas [name], MRN:  [id number], DOB:  1960-04-09, LOS: 0 ADMISSION DATE:  12/23/2021, CONSULTATION DATE:  11/24 REFERRING MD: Dr. Loleta Books, CHIEF COMPLAINT: Cough, Fever, N/V   History of Present Illness:  61 y/o M who presented to [facility] ER  on 11/23 with reports of nausea, vomiting, fever to 104, shaking chills, & cough with bloody sputum production.   The patient is a Theme park manager.  He drives to [facility] every weekend for work. Retired from [employer]. He smoked in his 6's to age 27, approximately 1/2-1ppd, quid 1990.  He lives with his wife and is independent of all ADL's.   He reports he & his wife had COVID in September (9/20) and was treated with a medrol dose pack. He feels he has not completely recovered from a pulmonary standpoint since the illness. He was seen in the pulmonary clinic on 11/6 by Dr. Vaughan Browner. He was treated with a Z-Pak and Medrol dose pack.  The patient reports he did not feel better after taking. He was seen by his PCP on 11/13 and felt to have a RML PNA and was given 10 days of Augmentin + nebulizer Q4 if needed. The patient again did not feel he had resolution of symptoms.    He presented to [facility] on 11/23 with reports of ongoing weakness, body aches, decreased appetite over several weeks.  In addition, on Thanksgiving he developed shaking chills, fever to 104, nausea, vomiting & diarrhea.  He isolated himself from the family but felt so weak he was concerned he might pass out. This prompted evaluation in the ER.  In the ER, he was treated with IV fluid resuscitation, &  empiric broad spectrum antibiotics. Pan cultures were obtained. Influenza A/B negative, COVID negative, extended RVP negative.  Blood cultures pending.  He was admitted per [facility] for further evaluation.   He has baseline acid reflux but has felt this has been under control with medications. He reports he had his nasal polyps removed and feel they have not returned.  He notes he has been coughing up blood recently -  he woke with one episode of bright red blood.  Since that time, he has had sputum mixed with blood, old blood and pink secretions.  He had a CT angio of the chest which showed a 60m subpleural pulmonary nodule, old scarring on left and new right sided multi-lobar infiltrates.     PCCM consulted for pulmonary evaluation.   Pertinent  Medical History  GERD Back Pain  Allergies  Moderate Persistent Asthma - diagnosed in approx 2018, on fluticasone-vilanterol, PRN albuterol  Nasal Polyposis  Peripheral Eosinophilia  Possible ASA Allergy  Chronic Sinusitis  Sigmoid Colectomy   Significant Hospital Events: Including procedures, antibiotic start and stop dates in addition to other pertinent events   11/23 Admit  11/24 PCCM consulted for pulmonary evaluation   Interim History / Subjective:  As above  Tmax 99.3   Objective   Blood pressure (!) 158/70, pulse 79, temperature 99.3 F (37.4 C), temperature source Oral, resp. rate 16, height '5\' 11"'$  (1.803 m), weight 109.9 kg, SpO2 97 %.        Intake/Output Summary (Last 24 hours) at 12/24/2021 1406 Last data filed at 12/24/2021 0600 Gross per 24 hour  Intake 817.58 ml  Output --  Net 817.58 ml   Filed Weights   12/24/21 0405  Weight: 109.9 kg    Examination: General: adult male lying in bed in NAD  HENT: MM pink/moist,  nasal passage with mild erythema, no bleeding or visible polyps Lungs: non-labored at rest on RA, lungs bilaterally diminished with fine crackles on right Cardiovascular: s1s2 RRR, no m/r/g Abdomen: soft/non-tender, bsx4 active  Extremities: warm/dry, no edema  Neuro: AAOx4, speech clear, MAE   Resolved Hospital Problem list     Assessment & Plan:   Pulmonary Infiltrates  Fever  Hemoptysis 32m Sub Pleural Pulmonary Nodule  Moderate Persistent Asthma  Peripheral Eosinophilia  Nasal Polyposis  Mild AKI   Hx of recent COVID infection, negative on admit.  Has completed 2 rounds of outpatient abx (azithro  & augmentin) + steroids.  Baseline moderate persistent asthma on singulair, Breo & PRN albuterol.  Nasal polyposis. He does have an allergic component to his asthma with elevated eos in the past. Radiographic review shows old scarring on the left, new right sided infiltrates, 342msubpleural nodule.  PE rule out.  Differential includes - slow to resolve post viral changes, vomiting prior to admit raises concern for possible aspiration, cryptogenic organizing PNA and [place] Straus (1.3 eos ~ 2019 spike), acute/chronic eosinophilic PNA.  Not improved on abx. On a PPI at baseline. He recently completed steroids which will impact information from FOB.  No significant lymphadenopathy on CT.  -patient ok to eat -will need outpatient CT, timing to be determined (possibly two > one for infiltrates / one for nodule at later time) -consider FOB as outpatient for washings, airway inspection, possible LN biopsy  -continue ceftriaxone  -assess ANA, ANCA, CRP, UA  -urine strep, legionella pending  -HIV pending -continue singulair -add Breo -follow intermittent CXR   Best Practice (right click and "Reselect all SmartList Selections" daily)  Per [facility]   Labs   CBC: Recent Labs  Lab 12/24/21 0000  WBC 12.3*  NEUTROABS 10.5*  HGB 13.0  HCT 37.6*  MCV 92.2  PLT 26462  Basic Metabolic Panel: Recent Labs  Lab 12/24/21 0000  NA 136  K 3.8  CL 107  CO2 21*  GLUCOSE 220*  BUN 20  CREATININE 1.26*  CALCIUM 8.9   GFR: Estimated Creatinine Clearance: 75.6 mL/min (A) (by C-G formula based on SCr of 1.26 mg/dL (H)). Recent Labs  Lab 12/24/21 0000 12/24/21 0142  WBC 12.3*  --   LATICACIDVEN 2.2* 1.6    Liver Function Tests: Recent Labs  Lab 12/24/21 0000  AST 26  ALT 29  ALKPHOS 67  BILITOT 0.6  PROT 7.0  ALBUMIN 4.2   No results for input(s): "LIPASE", "AMYLASE" in the last 168 hours. No results for input(s): "AMMONIA" in the last 168 hours.  ABG No results found for: "PHART",  "PCO2ART", "PO2ART", "HCO3", "TCO2", "ACIDBASEDEF", "O2SAT"   Coagulation Profile: Recent Labs  Lab 12/24/21 0000  INR 1.0    Cardiac Enzymes: No results for input(s): "CKTOTAL", "CKMB", "CKMBINDEX", "TROPONINI" in the last 168 hours.  HbA1C: Hgb A1c MFr Bld  Date/Time Value Ref Range Status  09/21/2021 12:35 PM 5.5 4.8 - 5.6 % Final    Comment:             Prediabetes: 5.7 - 6.4          Diabetes: >6.4          Glycemic control for adults with diabetes: <7.0   02/24/2021 10:30 AM 6.5 4.6 - 6.5 % Final    Comment:    Glycemic Control Guidelines for People with Diabetes:Non Diabetic:  <6%Goal of Therapy: <7%Additional Action Suggested:  >8%     CBG:  Recent Labs  Lab 12/24/21 1142  GLUCAP 115*    Review of Systems:   Gen: Denies fever, chills, weight change, fatigue, night sweats HEENT: Denies blurred vision, double vision, hearing loss, tinnitus, sinus congestion, rhinorrhea, sore throat, neck stiffness, dysphagia PULM: Denies shortness of breath, cough, sputum production, hemoptysis, wheezing CV: Denies chest pain, edema, orthopnea, paroxysmal nocturnal dyspnea, palpitations GI: Denies abdominal pain, nausea, vomiting, diarrhea, hematochezia, melena, constipation, change in bowel habits GU: Denies dysuria, hematuria, polyuria, oliguria, urethral discharge Endocrine: Denies hot or cold intolerance, polyuria, polyphagia or appetite change Derm: Denies rash, dry skin, scaling or peeling skin change Heme: Denies easy bruising, bleeding, bleeding gums Neuro: Denies headache, numbness, weakness, slurred speech, loss of memory or consciousness  Past Medical History:  He,  has a past medical history of Allergy, Asthma, Back pain, Diabetes ([place]), Dyspnea, GERD (gastroesophageal reflux disease), History of hiatal hernia, Pneumonia, SOB (shortness of breath), and Swallowing difficulty.   Surgical History:   Past Surgical History:  Procedure Laterality Date   ANTERIOR CERVICAL  DECOMP/DISCECTOMY FUSION N/A 07/14/2016   Procedure: Cervical five-six5- Cervical six-seven Anterior cervical decompression/discectomy/fusion;  Surgeon: Erline Levine, MD;  Location: [place];  Service: Neurosurgery;  Laterality: N/A;   COLON SURGERY     COLONOSCOPY  12/22/2014   per Dr. Earlean Shawl, clear, repeat in 5 yrs (hx of adenomas)    DG THUMB LEFT HAND     DG THUMB RIGHT HAND ([place] HX)     FUNCTIONAL ENDOSCOPIC SINUS SURGERY     PARTIAL COLECTOMY     sigmoid, Dr. Earlean Shawl     Social History:   reports that he quit smoking about 35 years ago. His smoking use included cigarettes. He has a 10.00 pack-year smoking history. He has never used smokeless tobacco. He reports current alcohol use. He reports that he does not use drugs.   Family History:  His family history includes Cancer in his father and mother; Diabetes in an other family member; Hyperlipidemia in his father.   Allergies Allergies  Allergen Reactions   No Known Allergies      Home Medications  Prior to Admission medications   Medication Sig Start Date End Date Taking? Authorizing Provider  albuterol (PROAIR HFA) 108 (90 Base) MCG/ACT inhaler INHALE 2 PUFFS EVERY 6 HOURS AS NEEDED FOR WHEEZING OR SHORTNESS OF BREATH. 03/30/21  Yes Laurey Morale, MD  albuterol (PROVENTIL) (2.5 MG/3ML) 0.083% nebulizer solution Take 3 mLs (2.5 mg total) by nebulization every 4 (four) hours as needed for wheezing or shortness of breath. 03/30/21  Yes Laurey Morale, MD  fluticasone [facility]) 50 MCG/ACT nasal spray SPRAY 2 SPRAYS INTO EACH NOSTRIL EVERY DAY 10/11/21  Yes Laurey Morale, MD  lansoprazole (PREVACID) 15 MG capsule Take 1 capsule (15 mg total) by mouth daily. 07/24/19  Yes Laurey Morale, MD  montelukast (SINGULAIR) 10 MG tablet TAKE 1 TABLET BY MOUTH EVERY DAY 03/30/21  Yes Mannam, Praveen, MD  SYMBICORT 160-4.5 MCG/ACT inhaler Inhale 2 puffs into the lungs in the morning and at bedtime. 12/10/21  Yes [provider]  Vitamin D,  Ergocalciferol, (DRISDOL) 1.25 MG (50000 UNIT) CAPS capsule Take 1 capsule (50,000 Units total) by mouth every 14 (fourteen) days. 09/21/21  Yes Jearld Lesch A, DO  fluticasone furoate-vilanterol (BREO ELLIPTA) 200-25 MCG/ACT AEPB Inhale 1 puff into the lungs daily. 12/06/21   Mannam, Hart Robinsons, MD  mupirocin ointment (BACTROBAN) 2 % Apply 1 Application topically 3 (three) times daily. Patient not taking: Reported on  12/24/2021 10/19/21   Laurey Morale, MD        Noe Gens, MSN, APRN, NP-C, AGACNP-BC [facility] 12/24/2021, 2:06 PM   Please see [email or web address] for pager details.   From 7A-7P if no response, please call [phone] After hours, please call ELink [phone]

## 2021-12-24 NOTE — Assessment & Plan Note (Addendum)
Patient is on Singulair, Breo Ellipta (previously on Symbicort), albuterol as an outpatient.  Patient has requested to be transitioned back to Symbicort.  Inhaled corticosteroid held secondary to pneumonia diagnosis. Resume home regimen on discharge.

## 2021-12-24 NOTE — Assessment & Plan Note (Addendum)
Meets sepsis criteria due to fever of 101, lactic acid of 2.2, WBC 12.3 and pneumonia seen on CTPA

## 2021-12-24 NOTE — Assessment & Plan Note (Addendum)
Well controlled. Hemoglobin A1C of 6.3%. Patient managed with diet modification alone. Started on SSI while inpatient. Continue diet modification as an outpatient.

## 2021-12-24 NOTE — Subjective & Objective (Signed)
CC: SOB, cough, fever HPI: 61 year old African-American male history of type 2 diabetes, obesity, asthma, reflux presents to the ER today with worsening shortness of breath, fever.  Was treated for the last 10 days with p.o. Augmentin for right middle lobe pneumonia.  This was diagnosed by his PCP.  Patient states he had COVID in September 2023 is never gotten over this.  Recently changed over to [facility] from Symbicort by his pulmonologist as his health insurance would no longer cover Symbicort.  He states that his asthma is gotten tremendously worse on dry powder inhaler.  Patient developed a fever 2 days ago.  Initially was 101.  Spiked to 104 today.  Was eating dinner.  Started feeling short of breath.  Took a nebulizer treatment.  Hour later started feeling worse.  Vomited at home.  Came by EMS.  Noted to be febrile to 101.  White count 12.3, hemoglobin 13, platelets of 263.  Initial lactic acid mildly elevated 2.2.  Repeat lactic acid 1.6.  Chest x-ray showed no acute process  I requested a CT angiogram.  This did show lobar pneumonia.  No evidence of PE.  Patient started on cefepime, vancomycin, Flagyl by the ER.  Will downgrade this to Rocephin and Zithromax.  COVID and influenza A & B are both negative.  Tried hospitalist contacted for admission.

## 2021-12-24 NOTE — Assessment & Plan Note (Signed)
Stable.  Continue PPI. 

## 2021-12-24 NOTE — H&P (Signed)
History and Physical    Thomas [name] ZDG:[id number] DOB: 24-Apr-1960 DOA: 12/23/2021  DOS: the patient was seen and examined on 12/23/2021  PCP: Laurey Morale, MD   Patient coming from: Home  I have personally briefly reviewed patient's old medical records in [place]  CC: SOB, cough, fever HPI: 61 year old African-American male history of type 2 diabetes, obesity, asthma, reflux presents to the ER today with worsening shortness of breath, fever.  Was treated for the last 10 days with p.o. Augmentin for right middle lobe pneumonia.  This was diagnosed by his PCP.  Patient states he had COVID in September 2023 is never gotten over this.  Recently changed over to [facility] from Symbicort by his pulmonologist as his health insurance would no longer cover Symbicort.  He states that his asthma is gotten tremendously worse on dry powder inhaler.  Patient developed a fever 2 days ago.  Initially was 101.  Spiked to 104 today.  Was eating dinner.  Started feeling short of breath.  Took a nebulizer treatment.  Hour later started feeling worse.  Vomited at home.  Came by EMS.  Noted to be febrile to 101.  White count 12.3, hemoglobin 13, platelets of 263.  Initial lactic acid mildly elevated 2.2.  Repeat lactic acid 1.6.  Chest x-ray showed no acute process  I requested a CT angiogram.  This did show lobar pneumonia.  No evidence of PE.  Patient started on cefepime, vancomycin, Flagyl by the ER.  Will downgrade this to Rocephin and Zithromax.  COVID and influenza A & B are both negative.  Tried hospitalist contacted for admission.   ED Course: CTPA shows lobar pneumonia, WBC 12.3  Review of Systems:  Review of Systems  Constitutional:  Positive for fever and malaise/fatigue.  HENT: Negative.    Eyes: Negative.   Respiratory:  Positive for cough and shortness of breath.   Cardiovascular: Negative.   Gastrointestinal:  Positive for vomiting.  Genitourinary: Negative.    Musculoskeletal: Negative.   Skin: Negative.   Neurological: Negative.   Endo/Heme/Allergies: Negative.   Psychiatric/Behavioral: Negative.    All other systems reviewed and are negative.   Past Medical History:  Diagnosis Date   Allergy    Asthma    Back pain    Diabetes ([place])    Dyspnea    GERD (gastroesophageal reflux disease)    History of hiatal hernia    Pneumonia    5 -7 yrs ago   SOB (shortness of breath)    Swallowing difficulty     Past Surgical History:  Procedure Laterality Date   ANTERIOR CERVICAL DECOMP/DISCECTOMY FUSION N/A 07/14/2016   Procedure: Cervical five-six5- Cervical six-seven Anterior cervical decompression/discectomy/fusion;  Surgeon: Erline Levine, MD;  Location: [place];  Service: Neurosurgery;  Laterality: N/A;   COLON SURGERY     COLONOSCOPY  12/22/2014   per Dr. Earlean Shawl, clear, repeat in 5 yrs (hx of adenomas)    DG THUMB LEFT HAND     DG THUMB RIGHT HAND ([place] HX)     FUNCTIONAL ENDOSCOPIC SINUS SURGERY     PARTIAL COLECTOMY     sigmoid, Dr. Earlean Shawl     reports that he quit smoking about 35 years ago. His smoking use included cigarettes. He has a 10.00 pack-year smoking history. He has never used smokeless tobacco. He reports current alcohol use. He reports that he does not use drugs.  Allergies  Allergen Reactions   No Known Allergies  Family History  Problem Relation Age of Onset   Cancer Mother    Cancer Father    Hyperlipidemia Father    Diabetes Other        maternal family    Prior to Admission medications   Medication Sig Start Date End Date Taking? Authorizing Provider  albuterol (PROAIR HFA) 108 (90 Base) MCG/ACT inhaler INHALE 2 PUFFS EVERY 6 HOURS AS NEEDED FOR WHEEZING OR SHORTNESS OF BREATH. 03/30/21  Yes Laurey Morale, MD  albuterol (PROVENTIL) (2.5 MG/3ML) 0.083% nebulizer solution Take 3 mLs (2.5 mg total) by nebulization every 4 (four) hours as needed for wheezing or shortness of breath. 03/30/21  Yes Laurey Morale, MD  fluticasone [facility]) 50 MCG/ACT nasal spray SPRAY 2 SPRAYS INTO EACH NOSTRIL EVERY DAY 10/11/21  Yes Laurey Morale, MD  lansoprazole (PREVACID) 15 MG capsule Take 1 capsule (15 mg total) by mouth daily. 07/24/19  Yes Laurey Morale, MD  montelukast (SINGULAIR) 10 MG tablet TAKE 1 TABLET BY MOUTH EVERY DAY 03/30/21  Yes Mannam, Praveen, MD  SYMBICORT 160-4.5 MCG/ACT inhaler Inhale 2 puffs into the lungs in the morning and at bedtime. 12/10/21  Yes [provider]  Vitamin D, Ergocalciferol, (DRISDOL) 1.25 MG (50000 UNIT) CAPS capsule Take 1 capsule (50,000 Units total) by mouth every 14 (fourteen) days. 09/21/21  Yes Jearld Lesch A, DO  amoxicillin-clavulanate (AUGMENTIN) 875-125 MG tablet Take 1 tablet by mouth 2 (two) times daily. Patient not taking: Reported on 12/24/2021 12/13/21   Laurey Morale, MD  fluticasone furoate-vilanterol (BREO ELLIPTA) 200-25 MCG/ACT AEPB Inhale 1 puff into the lungs daily. 12/06/21   Mannam, Hart Robinsons, MD  mupirocin ointment (BACTROBAN) 2 % Apply 1 Application topically 3 (three) times daily. Patient not taking: Reported on 12/24/2021 10/19/21   Laurey Morale, MD    Physical Exam: Vitals:   12/23/21 2353 12/23/21 2359  BP: (!) 133/91   Pulse: (!) 108   Resp: (!) 22   Temp: (!) 101.1 F (38.4 C)   TempSrc: Oral   SpO2: 96% 94%    Physical Exam Vitals and nursing note reviewed.  Constitutional:      General: He is not in acute distress.    Appearance: Normal appearance. He is not toxic-appearing or diaphoretic.  HENT:     Head: Normocephalic and atraumatic.     Nose: Nose normal.  Eyes:     General: No scleral icterus. Cardiovascular:     Rate and Rhythm: Normal rate and regular rhythm.     Pulses: Normal pulses.  Pulmonary:     Effort: No respiratory distress.     Breath sounds: Examination of the right-middle field reveals rales. Examination of the right-lower field reveals rales. Rales present.  Abdominal:     General: Abdomen is  flat. Bowel sounds are normal. There is no distension.     Palpations: Abdomen is soft.     Tenderness: There is no abdominal tenderness. There is no guarding.  Musculoskeletal:     Right lower leg: No edema.     Left lower leg: No edema.  Skin:    General: Skin is warm and dry.     Capillary Refill: Capillary refill takes less than 2 seconds.  Neurological:     General: No focal deficit present.     Mental Status: He is alert and oriented to person, place, and time.      Labs on Admission: I have personally reviewed following labs and imaging studies  CBC: Recent Labs  Lab 12/24/21 0000  WBC 12.3*  NEUTROABS 10.5*  HGB 13.0  HCT 37.6*  MCV 92.2  PLT 716   Basic Metabolic Panel: Recent Labs  Lab 12/24/21 0000  NA 136  K 3.8  CL 107  CO2 21*  GLUCOSE 220*  BUN 20  CREATININE 1.26*  CALCIUM 8.9   GFR: Estimated Creatinine Clearance: 74.5 mL/min (A) (by C-G formula based on SCr of 1.26 mg/dL (H)). Liver Function Tests: Recent Labs  Lab 12/24/21 0000  AST 26  ALT 29  ALKPHOS 67  BILITOT 0.6  PROT 7.0  ALBUMIN 4.2   No results for input(s): "LIPASE", "AMYLASE" in the last 168 hours. No results for input(s): "AMMONIA" in the last 168 hours. Coagulation Profile: Recent Labs  Lab 12/24/21 0000  INR 1.0   Cardiac Enzymes: No results for input(s): "CKTOTAL", "CKMB", "CKMBINDEX", "TROPONINI", "TROPONINIHS" in the last 168 hours. BNP (last 3 results) No results for input(s): "PROBNP" in the last 8760 hours. HbA1C: No results for input(s): "HGBA1C" in the last 72 hours. CBG: No results for input(s): "GLUCAP" in the last 168 hours. Lipid Profile: No results for input(s): "CHOL", "HDL", "LDLCALC", "TRIG", "CHOLHDL", "LDLDIRECT" in the last 72 hours. Thyroid Function Tests: No results for input(s): "TSH", "T4TOTAL", "FREET4", "T3FREE", "THYROIDAB" in the last 72 hours. Anemia Panel: No results for input(s): "VITAMINB12", "FOLATE", "FERRITIN", "TIBC", "IRON",  "RETICCTPCT" in the last 72 hours. Urine analysis:    Component Value Date/Time   COLORURINE YELLOW 12/24/2021 0213   APPEARANCEUR CLEAR 12/24/2021 0213   LABSPEC 1.034 (H) 12/24/2021 0213   PHURINE 5.0 12/24/2021 0213   GLUCOSEU 50 (A) 12/24/2021 0213   HGBUR NEGATIVE 12/24/2021 0213   BILIRUBINUR NEGATIVE 12/24/2021 0213   BILIRUBINUR neg 12/25/2018 1455   KETONESUR 5 (A) 12/24/2021 0213   PROTEINUR NEGATIVE 12/24/2021 0213   UROBILINOGEN 0.2 12/25/2018 1455   NITRITE NEGATIVE 12/24/2021 0213   LEUKOCYTESUR NEGATIVE 12/24/2021 9678    Radiological Exams on Admission: I have personally reviewed images CT Angio Chest PE W and/or Wo Contrast  Result Date: 12/24/2021 CLINICAL DATA:  Shortness of breath. EXAM: CT ANGIOGRAPHY CHEST WITH CONTRAST TECHNIQUE: Multidetector CT imaging of the chest was performed using the standard protocol during bolus administration of intravenous contrast. Multiplanar CT image reconstructions and MIPs were obtained to evaluate the vascular anatomy. RADIATION DOSE REDUCTION: This exam was performed according to the departmental dose-optimization program which includes automated exposure control, adjustment of the mA and/or kV according to patient size and/or use of iterative reconstruction technique. CONTRAST:  66m OMNIPAQUE IOHEXOL 350 MG/ML SOLN COMPARISON:  Chest radiograph dated 12/24/2021. FINDINGS: Evaluation of this exam is limited due to respiratory motion artifact. Cardiovascular: There is no cardiomegaly or pericardial effusion. The thoracic aorta is unremarkable. The origins of the great vessels of the aortic arch are patent. Evaluation of the pulmonary arteries is very limited due to respiratory motion. No pulmonary artery embolus identified. Mediastinum/Nodes: No hilar or mediastinal adenopathy. The esophagus is grossly unremarkable. No mediastinal fluid collection. Lungs/Pleura: A 3 mm right upper lobe subpleural nodule (29/6). Bilateral patchy airspace  ground-glass opacities most consistent with multi lobar pneumonia. Clinical correlation is recommended. No pleural effusion or pneumothorax. The central airways are patent. Upper Abdomen: No acute abnormality. Musculoskeletal: No acute osseous pathology. Partially visualized lower cervical ACDF. Review of the MIP images confirms the above findings. IMPRESSION: 1. No CT evidence of pulmonary artery embolus. 2. Multi lobar pneumonia. 3. A 3 mm right upper lobe  subpleural nodule. No follow-up needed if patient is low-risk.This recommendation follows the consensus statement: Guidelines for Management of Incidental Pulmonary Nodules Detected on CT Images: From the Fleischner Society 2017; Radiology 2017; 284:228-243. Electronically Signed   By: Anner Crete M.D.   On: 12/24/2021 02:26   DG Chest Port 1 View  Result Date: 12/24/2021 CLINICAL DATA:  Possible sepsis.  Cold and flu symptoms for 3 days. EXAM: PORTABLE CHEST 1 VIEW COMPARISON:  12/13/2021. FINDINGS: The heart size and mediastinal contours are within normal limits. Minimal subsegmental atelectasis or scarring at the left lung base. No effusion or pneumothorax. Cervical spinal fusion hardware is noted. Degenerative changes are present thoracic spine. IMPRESSION: No active disease. Electronically Signed   By: Brett Fairy M.D.   On: 12/24/2021 00:07    EKG: My personal interpretation of EKG shows: NSR    Assessment/Plan Principal Problem:   Lobar pneumonia (HCC) Active Problems:   Sepsis with acute organ dysfunction without septic shock (HCC)   GERD   Obesity (BMI 30-39.9)   Type 2 diabetes mellitus without complications (HCC)   Asthma    Assessment and Plan: * Lobar pneumonia ([place]) Observation med/surg bed. Appears to have failed po augmentin. Start IV rocephin and po zithromax. Was missing atypical coverage with augmentin. Check legionella and strep pneumo antigen. Gentle IVF overnight. Scheduled duonebs q6h. Check respiratory viral  panel.  Sepsis with acute organ dysfunction without septic shock (HCC) Meets sepsis criteria due to fever of 101, lactic acid of 2.2, WBC 12.3 and pneumonia seen on CTPA  Asthma Pt states his asthma much worse since starting Breo. He does not like dry powder inhaler. Wants to get back on symbicort.  Type 2 diabetes mellitus without complications (HCC) Stable. Add SSI. Check A1c.  Obesity (BMI 30-39.9) Chronic. Followed at weight loss clinic.  GERD Stable. Continue PPI.   DVT prophylaxis: SQ Heparin Code Status: Full Code Family Communication: discussed with pt and wife Tonjia  Disposition Plan: return home  Consults called: none  Admission status: Observation, Med-Surg   Kristopher Oppenheim, DO [facility] 12/24/2021, 2:41 AM

## 2021-12-24 NOTE — Assessment & Plan Note (Addendum)
Community acquired pneumonia of right middle lob; failed outpatient treatment. Patient start on empiric Vancomycin/Cefepime/Flagyl and transitioned to Ceftriaxone IV. Sputum culture obtained and is pending. Pulmonology consulted and performed bronchoscopy on 11/27. Cytology, fungal culture, respiratory culture, AFB, acid fast culture results from bronchoscopy pending on day of discharge. Patient recommended to follow-up with pulmonology who will set up an appointment. Patient transitioned to Cefdinir for discharge to complete a 7 day course of antibiotics.

## 2021-12-24 NOTE — Assessment & Plan Note (Addendum)
Estimated body mass index is 33.52 kg/m as calculated from the following:   Height as of this encounter: '5\' 11"'$  (1.803 m).   Weight as of this encounter: 109 kg.

## 2021-12-24 NOTE — Hospital Course (Addendum)
Thomas [name] is a 61 y.o. M with DM, obesity, asthma, nasal polyposis and reflux who presented with fever, productive cough and shortness of breath.  Had [place] in Sept and since then had persistent chest congestion, cough and wheezing.    Mid-Oct, took Medrol dosepak with maybe partial improvement from PCP.  Nov 6, saw his pulmonologist for routine visit, still with congestion, cough, wheezing, treated with azithromycin/prednisone, no improvement  Nov 13, saw PCP with ongoing productive cough, yellow sputum sometimes scant hemoptysis, treated with Augmentin.  Nov 24, came to ER due to new onset fever for 2 days to 101F, malaise, SOB, emesis.    In the ER, WBC 12, lactate 2.2.  CXR clear, CTA chest showed lobar pneumonia, no PE.  Started on antibiotics and admitted.

## 2021-12-25 DIAGNOSIS — J181 Lobar pneumonia, unspecified organism: Secondary | ICD-10-CM | POA: Diagnosis not present

## 2021-12-25 DIAGNOSIS — E669 Obesity, unspecified: Secondary | ICD-10-CM | POA: Diagnosis not present

## 2021-12-25 DIAGNOSIS — J4540 Moderate persistent asthma, uncomplicated: Secondary | ICD-10-CM | POA: Diagnosis not present

## 2021-12-25 DIAGNOSIS — E119 Type 2 diabetes mellitus without complications: Secondary | ICD-10-CM | POA: Diagnosis not present

## 2021-12-25 LAB — BASIC METABOLIC PANEL
Anion gap: 7 (ref 5–15)
BUN: 11 mg/dL (ref 8–23)
CO2: 25 mmol/L (ref 22–32)
Calcium: 8.5 mg/dL — ABNORMAL LOW (ref 8.9–10.3)
Chloride: 107 mmol/L (ref 98–111)
Creatinine, Ser: 0.99 mg/dL (ref 0.61–1.24)
GFR, Estimated: >60 mL/min (ref >60)
Glucose, Bld: 137 mg/dL — ABNORMAL HIGH (ref 70–99)
Potassium: 3.6 mmol/L (ref 3.5–5.1)
Sodium: 139 mmol/L (ref 135–145)

## 2021-12-25 LAB — URINE CULTURE
Culture: NO GROWTH
Special Requests: NORMAL

## 2021-12-25 LAB — HIV ANTIBODY (ROUTINE TESTING W REFLEX): HIV Screen 4th Generation wRfx: NONREACTIVE

## 2021-12-25 LAB — GLUCOSE, CAPILLARY
Glucose-Capillary: 160 mg/dL — ABNORMAL HIGH (ref 70–99)
Glucose-Capillary: 164 mg/dL — ABNORMAL HIGH (ref 70–99)
Glucose-Capillary: 94 mg/dL (ref 70–99)
Glucose-Capillary: 128 mg/dL — ABNORMAL HIGH (ref 70–99)

## 2021-12-25 LAB — ANA: Anti Nuclear Antibody (ANA): NEGATIVE

## 2021-12-25 LAB — CBC
HCT: 35.2 % — ABNORMAL LOW (ref 39.0–52.0)
Hemoglobin: 12.1 g/dL — ABNORMAL LOW (ref 13.0–17.0)
MCH: 31.7 pg (ref 26.0–34.0)
MCHC: 34.4 g/dL (ref 30.0–36.0)
MCV: 92.1 fL (ref 80.0–100.0)
Platelets: 234 10*3/uL (ref 150–400)
RBC: 3.82 MIL/uL — ABNORMAL LOW (ref 4.22–5.81)
RDW: 12.5 % (ref 11.5–15.5)
WBC: 12.6 10*3/uL — ABNORMAL HIGH (ref 4.0–10.5)
nRBC: 0 % (ref 0.0–0.2)

## 2021-12-25 LAB — HEMOGLOBIN A1C
Hgb A1c MFr Bld: 6.3 % — ABNORMAL HIGH (ref 4.8–5.6)
Mean Plasma Glucose: 134 mg/dL

## 2021-12-25 MED ORDER — OXYCODONE HCL 5 MG PO TABS
5.0000 mg | ORAL_TABLET | Freq: Once | ORAL | Status: AC
  Administered 2021-12-25: 5 mg via ORAL
  Filled 2021-12-25: qty 1

## 2021-12-25 MED ORDER — OXYCODONE HCL 5 MG PO TABS
5.0000 mg | ORAL_TABLET | ORAL | Status: DC | PRN
  Administered 2021-12-25 – 2021-12-27 (×6): 5 mg via ORAL
  Filled 2021-12-25 (×6): qty 1

## 2021-12-25 NOTE — Consult Note (Signed)
NAME:  Thomas [name], MRN:  [id number], DOB:  April 18, 1960, LOS: 1 ADMISSION DATE:  12/23/2021, CONSULTATION DATE:  11/24 REFERRING MD: Dr. Loleta Books, CHIEF COMPLAINT: Cough, Fever, N/V   History of Present Illness:  61 y/o M who presented to [facility] ER  on 11/23 with reports of nausea, vomiting, fever to 104, shaking chills, & cough with bloody sputum production.   The patient is a Theme park manager.  He drives to [facility] every weekend for work. Retired from [employer]. He smoked in his 34's to age 7, approximately 1/2-1ppd, quid 1990.  He lives with his wife and is independent of all ADL's.   He reports he & his wife had COVID in September (9/20) and was treated with a medrol dose pack. He feels he has not completely recovered from a pulmonary standpoint since the illness. He was seen in the pulmonary clinic on 11/6 by Dr. Vaughan Browner. He was treated with a Z-Pak and Medrol dose pack.  The patient reports he did not feel better after taking. He was seen by his PCP on 11/13 and felt to have a RML PNA and was given 10 days of Augmentin + nebulizer Q4 if needed. The patient again did not feel he had resolution of symptoms.    He presented to [facility] on 11/23 with reports of ongoing weakness, body aches, decreased appetite over several weeks.  In addition, on Thanksgiving he developed shaking chills, fever to 104, nausea, vomiting & diarrhea.  He isolated himself from the family but felt so weak he was concerned he might pass out. This prompted evaluation in the ER.  In the ER, he was treated with IV fluid resuscitation, &  empiric broad spectrum antibiotics. Pan cultures were obtained. Influenza A/B negative, COVID negative, extended RVP negative.  Blood cultures pending.  He was admitted per [facility] for further evaluation.   He has baseline acid reflux but has felt this has been under control with medications. He reports he had his nasal polyps removed and feel they have not returned.  He notes he has been coughing up blood recently -  he woke with one episode of bright red blood.  Since that time, he has had sputum mixed with blood, old blood and pink secretions.  He had a CT angio of the chest which showed a 25m subpleural pulmonary nodule, old scarring on left and new right sided multi-lobar infiltrates.     PCCM consulted for pulmonary evaluation.   Pertinent  Medical History  GERD Back Pain  Allergies  Moderate Persistent Asthma - diagnosed in approx 2018, on fluticasone-vilanterol, PRN albuterol  Nasal Polyposis  Peripheral Eosinophilia  Possible ASA Allergy  Chronic Sinusitis  Sigmoid Colectomy   Significant Hospital Events: Including procedures, antibiotic start and stop dates in addition to other pertinent events   11/23 Admit  11/24 PCCM consulted for pulmonary evaluation   Interim History / Subjective:  Overnight Tmax 100.4. Some malaise but able to walk with PT today Continues have light bloody sputum mixed with mucous  Objective   Blood pressure (!) 157/76, pulse 100, temperature (!) 100.4 F (38 C), temperature source Oral, resp. rate 18, height _0  (1.803 m), weight 108 kg, SpO2 96 %.        Intake/Output Summary (Last 24 hours) at 12/25/2021 1430 Last data filed at 12/24/2021 2200 Gross per 24 hour  Intake 643.64 ml  Output 300 ml  Net 343.64 ml   Filed Weights   12/24/21 0405 12/25/21 0500  Weight:  109.9 kg 108 kg   Physical Exam: General: Well-appearing, no acute distress HENT: [place], AT Eyes: EOMI, no scleral icterus Respiratory: Clear to auscultation bilaterally.  No crackles, wheezing or rales Cardiovascular: RRR, -M/R/G, no JVD Extremities:-Edema,-tenderness Neuro: AAO x4, CNII-XII grossly intact Psych: Normal mood, normal affect    Resolved Hospital Problem list     Assessment & Plan:   Bilateral pulmonary infiltrates, hemoptysis, mild fever Moderate persistent asthma with peripheral eosinophilia, hx nasal polyps  Recent COVID-19 infection Subpleural 60m lung nodule  - no follow-up indicated   History and data reviewed. Persistent respiratory symptoms in the last two months since his initial COVID dx on 10/20/21 followed by treatment for recurrent pneumonia in November x 2. Pulmonary infiltrates likely related to current pneumonia with slow resolution in setting of prior COVID infection/scarring. Possible aspiration in setting of N/V may have contributed as well. Ddx also includes eosinophilic pneumonias with hx of peripheral eosinophilia and asthma. Low suspicion for acute eosinophilic pneumonia/pneumonitis given the time course however CEP and EGPA could be considered. Bronchoscopy would be helpful in ruling this out. Pending patient improvement this can be arranged after the weekend or as an outpatient. UA without blood  ANA neg, ANCA pending ESR, CRP mildly elevated Urine strep neg, legionella pending   -Continue CAP coverage, scheduled nebs -Plan for potential bronchoscopy with BAL on 12/27/21 with Dr. MVaughan Browner Will need to contact endoscopy suite -Continue Dulera. Patient would like Symbicort as an outpatient. FAILED Breo due to ineffectiveness -Continue singulair  Best Practice (right click and "Reselect all SmartList Selections" daily)  [facility]     Care Time: 35 min  JRodman Pickle M.D. [facility] 12/25/2021 2:30 PM   See Amion for personal pager For hours between 7 PM to 7 AM, please call Elink for urgent questions

## 2021-12-25 NOTE — Progress Notes (Signed)
  Progress Note   Patient: Thomas [name] DOB: 1960/06/21 DOA: 12/23/2021     1 DOS: the patient was seen and examined on 12/25/2021 at 9:55AM      Brief hospital course: Thomas [name] is a 61 y.o. M with DM, obesity, asthma, nasal polyposis and reflux who presented with fever, productive cough and shortness of breath.  Had [place] in Sept and since then had persistent chest congestion, cough and wheezing.    Mid-Oct, took Medrol dosepak with maybe partial improvement from PCP.  Nov 6, saw his pulmonologist for routine visit, still with congestion, cough, wheezing, treated with azithromycin/prednisone, no improvement  Nov 13, saw PCP with ongoing productive cough, yellow sputum sometimes scant hemoptysis, treated with Augmentin.  Nov 24, came to ER due to new onset fever for 2 days to 101F, malaise, SOB, emesis.    In the ER, WBC 12, lactate 2.2.  CXR clear, CTA chest showed lobar pneumonia, no PE.  Started on antibiotics and admitted.        Assessment and Plan: * Lobar pneumonia ([place]) Sepsis ruled out.    Community acquired pneumonia of right middle lobe of lung Non-resolving pneumonia, failed outpatient treatment.  Still fevering yesterday - Continue Rocephin - Consult to Pulmonology for broadening differential of non-resolving pneumonia, they plan for bronchoscopy on Monday afternoon - Follow sputum culture     Asthma Told admitting MD his symptoms worsened with Breo, wishes to change back to Symbicort.    - Avoid inhaled steroids until recurrent pneumonia is ruled in or out - Continue Singulair - Continue pantoprazole  Type 2 diabetes mellitus without complications (HCC) K1S well controlled <7% on diet alone. - Continue SS corrections  Obesity (BMI 30-39.9) BMI 33  GERD Stable. Continue PPI.          Subjective: Feeling slightly better.  Thinks back is asymmetric. Some fever yesterday.  Still with hemoptysis and sputum.  No confusion,  respiratory distress, hcest pain.     Physical Exam: BP (!) 157/76 (BP Location: Left Arm)   Pulse 100   Temp (!) 100.4 F (38 C) (Oral)   Resp 18   Ht '5\' 11"'$  (1.803 m)   Wt 108 kg   SpO2 96%   BMI 33.21 kg/m   Well-nourished adult male, sitting up in bed, interactive and appropriate Tachycardic to 100, still febrile, no murmurs, no peripheral edema, no JVD Respiratory rate normal, lungs clear without rales or wheezes The chest may have some slight asymmetry in the right side but there is no crepitus, no masses, no tenderness, no redness, no edema on that side, and CT of the chest over there showed nothing Abdomen soft without tenderness palpation or guarding Attention normal, affect appropriate, judgment and insight appear normal    Data Reviewed: Discussed with pulmonology the plan for bronchoscopy Basic metabolic panel unremarkable HIV pending White blood cell count 12, no change ANA negative ANCA pending   Family Communication: Son at the bedside    Disposition: Status is: Inpatient Patient was admitted with pneumonia which failed azithromycin and Augmentin as an outpatient.  Pulmonology consulted and plan for bronchoscopy Monday afternoon  After bronchoscopy, the patient is remained afebrile, likely discharge with outpatient follow-up        Author: Edwin Dada, MD 12/25/2021 1:58 PM  For on call review [email or web address].

## 2021-12-25 NOTE — Progress Notes (Signed)
Mobility Specialist - Progress Note   12/25/21 1151  Mobility  Activity Ambulated independently in hallway  Level of Assistance Independent  Assistive Device None  Distance Ambulated (ft) 700 ft  Range of Motion/Exercises Active  Activity Response Tolerated well  Mobility Referral Yes  $Mobility charge 1 Mobility   Pt was found in bed and agreeable to ambulate. Had no complaints during session and returned to recliner chair with necessities in reach and son in room.  Ferd Hibbs Mobility Specialist

## 2021-12-26 DIAGNOSIS — J4540 Moderate persistent asthma, uncomplicated: Secondary | ICD-10-CM | POA: Diagnosis not present

## 2021-12-26 DIAGNOSIS — E119 Type 2 diabetes mellitus without complications: Secondary | ICD-10-CM | POA: Diagnosis not present

## 2021-12-26 DIAGNOSIS — J181 Lobar pneumonia, unspecified organism: Secondary | ICD-10-CM | POA: Diagnosis not present

## 2021-12-26 DIAGNOSIS — E669 Obesity, unspecified: Secondary | ICD-10-CM | POA: Diagnosis not present

## 2021-12-26 LAB — GLUCOSE, CAPILLARY
Glucose-Capillary: 109 mg/dL — ABNORMAL HIGH (ref 70–99)
Glucose-Capillary: 182 mg/dL — ABNORMAL HIGH (ref 70–99)
Glucose-Capillary: 98 mg/dL (ref 70–99)
Glucose-Capillary: 159 mg/dL — ABNORMAL HIGH (ref 70–99)

## 2021-12-26 LAB — CBC
HCT: 34.4 % — ABNORMAL LOW (ref 39.0–52.0)
Hemoglobin: 11.9 g/dL — ABNORMAL LOW (ref 13.0–17.0)
MCH: 31.9 pg (ref 26.0–34.0)
MCHC: 34.6 g/dL (ref 30.0–36.0)
MCV: 92.2 fL (ref 80.0–100.0)
Platelets: 235 10*3/uL (ref 150–400)
RBC: 3.73 MIL/uL — ABNORMAL LOW (ref 4.22–5.81)
RDW: 12.5 % (ref 11.5–15.5)
WBC: 10.2 10*3/uL (ref 4.0–10.5)
nRBC: 0 % (ref 0.0–0.2)

## 2021-12-26 LAB — COMPREHENSIVE METABOLIC PANEL
ALT: 19 U/L (ref 0–44)
AST: 15 U/L (ref 15–41)
Albumin: 3.2 g/dL — ABNORMAL LOW (ref 3.5–5.0)
Alkaline Phosphatase: 53 U/L (ref 38–126)
Anion gap: 7 (ref 5–15)
BUN: 11 mg/dL (ref 8–23)
CO2: 26 mmol/L (ref 22–32)
Calcium: 8.9 mg/dL (ref 8.9–10.3)
Chloride: 105 mmol/L (ref 98–111)
Creatinine, Ser: 0.93 mg/dL (ref 0.61–1.24)
GFR, Estimated: >60 mL/min (ref >60)
Glucose, Bld: 132 mg/dL — ABNORMAL HIGH (ref 70–99)
Potassium: 3.8 mmol/L (ref 3.5–5.1)
Sodium: 138 mmol/L (ref 135–145)
Total Bilirubin: 0.6 mg/dL (ref 0.3–1.2)
Total Protein: 6.4 g/dL — ABNORMAL LOW (ref 6.5–8.1)

## 2021-12-26 MED ORDER — BUTAMBEN-TETRACAINE-BENZOCAINE 2-2-14 % EX AERO
1.0000 | INHALATION_SPRAY | Freq: Once | CUTANEOUS | Status: DC
  Filled 2021-12-26: qty 20

## 2021-12-26 MED ORDER — PHENYLEPHRINE HCL 0.25 % NA SOLN
1.0000 | Freq: Four times a day (QID) | NASAL | Status: DC | PRN
  Filled 2021-12-26: qty 15

## 2021-12-26 MED ORDER — POLYETHYLENE GLYCOL 3350 17 G PO PACK
17.0000 g | PACK | Freq: Every day | ORAL | Status: DC
  Administered 2021-12-26 – 2021-12-27 (×2): 17 g via ORAL
  Filled 2021-12-26 (×2): qty 1

## 2021-12-26 MED ORDER — GUAIFENESIN 100 MG/5ML PO LIQD
5.0000 mL | ORAL | Status: DC | PRN
  Administered 2021-12-26 – 2021-12-27 (×2): 5 mL via ORAL
  Filled 2021-12-26 (×2): qty 10

## 2021-12-26 MED ORDER — ENOXAPARIN SODIUM 40 MG/0.4ML IJ SOSY: 40.0000 mg | PREFILLED_SYRINGE | INTRAMUSCULAR | Status: DC

## 2021-12-26 MED ORDER — LIDOCAINE HCL URETHRAL/MUCOSAL 2 % EX GEL
1.0000 | Freq: Once | CUTANEOUS | Status: DC
  Filled 2021-12-26: qty 5

## 2021-12-26 MED ORDER — HYDROCODONE BIT-HOMATROP MBR 5-1.5 MG/5ML PO SOLN
5.0000 mL | ORAL | Status: DC | PRN
  Administered 2021-12-26: 5 mL via ORAL
  Filled 2021-12-26 (×2): qty 473

## 2021-12-26 NOTE — Consult Note (Signed)
NAME:  Thomas [name], MRN:  [id number], DOB:  02-10-1960, LOS: 2 ADMISSION DATE:  12/23/2021, CONSULTATION DATE:  11/24 REFERRING MD: Dr. Loleta Books, CHIEF COMPLAINT: Cough, Fever, N/V   History of Present Illness:  61 y/o M who presented to [facility] ER  on 11/23 with reports of nausea, vomiting, fever to 104, shaking chills, & cough with bloody sputum production.   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 Retired from [employer]. He smoked in his 53's to age 67, approximately 1/2-1ppd, quid 1990.  He lives with his wife and is independent of all ADL's.   He reports he & his wife had COVID in September (9/20) and was treated with a medrol dose pack. He feels he has not completely recovered from a pulmonary standpoint since the illness. He was seen in the pulmonary clinic on 11/6 by Dr. Vaughan Browner. He was treated with a Z-Pak and Medrol dose pack.  The patient reports he did not feel better after taking. He was seen by his PCP on 11/13 and felt to have a RML PNA and was given 10 days of Augmentin + nebulizer Q4 if needed. The patient again did not feel he had resolution of symptoms.    He presented to [facility] on 11/23 with reports of ongoing weakness, body aches, decreased appetite over several weeks.  In addition, on Thanksgiving he developed shaking chills, fever to 104, nausea, vomiting & diarrhea.  He isolated himself from the family but felt so weak he was concerned he might pass out. This prompted evaluation in the ER.  In the ER, he was treated with IV fluid resuscitation, &  empiric broad spectrum antibiotics. Pan cultures were obtained. Influenza A/B negative, COVID negative, extended RVP negative.  Blood cultures pending.  He was admitted per [facility] for further evaluation.   He has baseline acid reflux but has felt this has been under control with medications. He reports he had his nasal polyps removed and feel they have not returned.  He notes he has been coughing up blood recently -  he woke with one episode of bright red blood.  Since that time, he has had sputum mixed with blood, old blood and pink secretions.  He had a CT angio of the chest which showed a 48m subpleural pulmonary nodule, old scarring on left and new right sided multi-lobar infiltrates.     PCCM consulted for pulmonary evaluation.   Pertinent  Medical History  GERD Back Pain  Allergies  Moderate Persistent Asthma - diagnosed in approx 2018, on fluticasone-vilanterol, PRN albuterol  Nasal Polyposis  Peripheral Eosinophilia  Possible ASA Allergy  Chronic Sinusitis  Sigmoid Colectomy   Significant Hospital Events: Including procedures, antibiotic start and stop dates in addition to other pertinent events   11/23 Admit  11/24 PCCM consulted for pulmonary evaluation   Interim History / Subjective:  Afebrile. Reports continued hemoptysis with blood streaked sputum, though on direct inspection it does appear to be somewhat improving  Objective   Blood pressure 133/75, pulse (!) 41, temperature 98.3 F (36.8 C), temperature source Oral, resp. rate 19, height _0  (1.803 m), weight 109 kg, SpO2 99 %.        Intake/Output Summary (Last 24 hours) at 12/26/2021 1227 Last data filed at 12/26/2021 0300 Gross per 24 hour  Intake 454.11 ml  Output 500 ml  Net -45.89 ml   Filed Weights   12/24/21 0405 12/25/21 0500 12/26/21 0422  Weight:  109.9 kg 108 kg 109 kg   Physical Exam: General: Well-appearing, no acute distress HENT: [place], AT, OP clear, MMM Eyes: EOMI, no scleral icterus Respiratory: Clear to auscultation bilaterally.  No crackles, wheezing or rales Cardiovascular: RRR, -M/R/G, no JVD Extremities:-Edema,-tenderness Neuro: AAO x4, CNII-XII grossly intact Psych: Normal mood, normal affect  WBC normalized   Resolved Hospital Problem list     Assessment & Plan:   Bilateral pulmonary infiltrates, hemoptysis, mild fever Moderate persistent asthma with peripheral eosinophilia, hx nasal  polyps  Recent COVID-19 infection Subpleural 13m lung nodule - no follow-up indicated   History and data reviewed. Persistent respiratory symptoms in the last two months since his initial COVID dx on 10/20/21 followed by treatment for recurrent pneumonia in November x 2. Pulmonary infiltrates likely related to current pneumonia with slow resolution in setting of prior COVID infection/scarring. Possible aspiration in setting of N/V may have contributed as well. Ddx also includes eosinophilic pneumonias with hx of peripheral eosinophilia and asthma. Low suspicion for acute eosinophilic pneumonia/pneumonitis given the time course however CEP and EGPA could be considered. Bronchoscopy would be helpful in ruling this out. Pending patient improvement this can be arranged after the weekend or as an outpatient. UA without blood  Clinically improving. Discussed bronchoscopy  ANA neg, ANCA pending ESR, CRP mildly elevated Urine strep neg, legionella pending   -Continue CAP coverage for 5-7 days, scheduled nebs -Plan for potential bronchoscopy with BAL on 12/27/21 with Dr. MVaughan Browner Left message with endoscopy suit and team will need to contact endoscopy suite to add-on patient -NPO at midnight -Rescheduled DVT ppx for 11/28 -Continue Dulera. Patient would like Symbicort as an outpatient. FAILED Breo due to ineffectiveness -Continue singulair  Best Practice (right click and "Reselect all SmartList Selections" daily)  Per [facility]      Care Time: 25 min  JRodman Pickle M.D. LDo[facility] Center[facility] 12/26/2021 12:27 PM   See Amion for personal pager For hours between 7 PM to 7 AM, please call Elink for urgent questions

## 2021-12-26 NOTE — Progress Notes (Signed)
PROGRESS NOTE    HAKOP HUMBARGER  AUQ:[id number] DOB: [birth year] DOA: 12/23/2021 PCP: Laurey Morale, MD   Brief Narrative: Thomas [name] is a 61 y.o. male with a history of diabetes mellitus type 2, obesity, asthma, nasal polyposis.  Patient presented secondary to cough and fever in addition to shortness of breath and found to have evidence of recurrent pneumonia.  Empiric vancomycin/cefepime/Flagyl started on admission with transition to ceftriaxone IV.  Secondary to recurrent nature, pulmonology was consulted who recommended bronchoscopy with BAL.  Patient is improving with antibiotic therapy at this time.   Assessment and Plan: * Lobar pneumonia ([place]) Community acquired pneumonia of right middle lob; failed outpatient treatment. Patient start on empiric Vancomycin/Cefepime/Flagyl and transitioned to Ceftriaxone IV. Sputum culture obtained and is pending. Pulmonology consulted and plan bronchoscopy on 11/27. -Continue Ceftriaxone -Follow pulmonology recommendations -Follow-up sputum culture  Asthma Patient is on Singulair, Breo Ellipta (previously on Symbicort), albuterol as an outpatient.  Patient has requested to be transitioned back to Symbicort.  Inhaled corticosteroid held secondary to pneumonia diagnosis. -Continue Singulair, DuoNeb -Continue Protonix  Type 2 diabetes mellitus without complications ([place]) Well controlled. Hemoglobin A1C of 6.3%. Patient managed with diet modification alone. Started on SSI while inpatient. -Continue SSI while inpatient -Continue carb modified diet  Obesity (BMI 30-39.9) Estimated body mass index is 33.52 kg/m as calculated from the following:   Height as of this encounter: _0  (1.803 m).   Weight as of this encounter: 109 kg.  GERD Stable. Continue PPI.    DVT prophylaxis: Lovenox Code Status:   Code Status: Full Code Family Communication: Wife and niece at bedside Disposition Plan: Discharge home likely in 1-2 days pending  bronchoscopy and pulmonology recommendations   Consultants:  Pulmonology  Procedures:  None  Antimicrobials: Vancomycin Cefepime Flagyl Ceftriaxone    Subjective: Patient reports continued productive cough with streak hempotysis. No chest pain. No dyspnea.  Objective: BP 133/75   Pulse (!) 41   Temp 98.3 F (36.8 C) (Oral)   Resp 19   Ht _1  (1.803 m)   Wt 109 kg   SpO2 99%   BMI 33.52 kg/m   Examination:  General exam: Appears calm and comfortable Respiratory system: Diminished on right. Respiratory effort normal. Cardiovascular system: S1 & S2 heard, RRR. No murmurs, rubs, gallops or clicks. Gastrointestinal system: Abdomen is nondistended, soft and nontender. No organomegaly or masses felt. Normal bowel sounds heard. Central nervous system: Alert and oriented. No focal neurological deficits. Musculoskeletal: No edema. No calf tenderness Skin: No cyanosis. No rashes Psychiatry: Judgement and insight appear normal. Mood & affect appropriate.    Data Reviewed: I have personally reviewed following labs and imaging studies  CBC Lab Results  Component Value Date   WBC 10.2 12/26/2021   RBC 3.73 (L) 12/26/2021   HGB 11.9 (L) 12/26/2021   HCT 34.4 (L) 12/26/2021   MCV 92.2 12/26/2021   MCH 31.9 12/26/2021   PLT 235 12/26/2021   MCHC 34.6 12/26/2021   RDW 12.5 12/26/2021   LYMPHSABS 0.7 12/24/2021   MONOABS 0.9 12/24/2021   EOSABS 0.1 12/24/2021   BASOSABS 0.1 63/89/3734     Last metabolic panel Lab Results  Component Value Date   NA 138 12/26/2021   K 3.8 12/26/2021   CL 105 12/26/2021   CO2 26 12/26/2021   BUN 11 12/26/2021   CREATININE 0.93 12/26/2021   GLUCOSE 132 (H) 12/26/2021   GFRNONAA >60 12/26/2021   GFRAA 95 02/12/2018  CALCIUM 8.9 12/26/2021   PROT 6.4 (L) 12/26/2021   ALBUMIN 3.2 (L) 12/26/2021   LABGLOB 2.2 09/21/2021   AGRATIO 2.0 09/21/2021   BILITOT 0.6 12/26/2021   ALKPHOS 53 12/26/2021   AST 15 12/26/2021   ALT 19  12/26/2021   ANIONGAP 7 12/26/2021    GFR: Estimated Creatinine Clearance: 102.1 mL/min (by C-G formula based on SCr of 0.93 mg/dL).  Recent Results (from the past 240 hour(s))  Resp Panel by RT-PCR (Flu A&B, Covid) Anterior Nasal Swab     Status: None   Collection Time: 12/24/21 12:00 AM   Specimen: Anterior Nasal Swab  Result Value Ref Range Status   SARS Coronavirus 2 by RT PCR NEGATIVE NEGATIVE Final    Comment: (NOTE) SARS-CoV-2 target nucleic acids are NOT DETECTED.  The SARS-CoV-2 RNA is generally detectable in upper respiratory specimens during the acute phase of infection. The lowest concentration of SARS-CoV-2 viral copies this assay can detect is 138 copies/mL. A negative result does not preclude SARS-Cov-2 infection and should not be used as the sole basis for treatment or other patient management decisions. A negative result may occur with  improper specimen collection/handling, submission of specimen other than nasopharyngeal swab, presence of viral mutation(s) within the areas targeted by this assay, and inadequate number of viral copies(<138 copies/mL). A negative result must be combined with clinical observations, patient history, and epidemiological information. The expected result is Negative.  Fact Sheet for Patients:  EntrepreneurPulse.com.au  Fact Sheet for Healthcare Providers:  IncredibleEmployment.be  This test is no t yet approved or cleared by the [place] FDA and  has been authorized for detection and/or diagnosis of SARS-CoV-2 by FDA under an Emergency Use Authorization (EUA). This EUA will remain  in effect (meaning this test can be used) for the duration of the COVID-19 declaration under Section 564(b)(1) of the Act, 21 U.S.C.section 360bbb-3(b)(1), unless the authorization is terminated  or revoked sooner.       Influenza A by PCR NEGATIVE NEGATIVE Final   Influenza B by PCR NEGATIVE NEGATIVE Final     Comment: (NOTE) The Xpert Xpress SARS-CoV-2/FLU/RSV plus assay is intended as an aid in the diagnosis of influenza from Nasopharyngeal swab specimens and should not be used as a sole basis for treatment. Nasal washings and aspirates are unacceptable for Xpert Xpress SARS-CoV-2/FLU/RSV testing.  Fact Sheet for Patients: EntrepreneurPulse.com.au  Fact Sheet for Healthcare Providers: IncredibleEmployment.be  This test is not yet approved or cleared by the [place] FDA and has been authorized for detection and/or diagnosis of SARS-CoV-2 by FDA under an Emergency Use Authorization (EUA). This EUA will remain in effect (meaning this test can be used) for the duration of the COVID-19 declaration under Section 564(b)(1) of the Act, 21 U.S.C. section 360bbb-3(b)(1), unless the authorization is terminated or revoked.  Performed at [facility], [place] [address], [place], [place] [address]   Blood Culture (routine x 2)     Status: None (Preliminary result)   Collection Time: 12/24/21 12:01 AM   Specimen: BLOOD  Result Value Ref Range Status   Specimen Description   Final    BLOOD BLOOD RIGHT FOREARM Performed at [place] [address], [place], [place] [address]    Special Requests   Final    BOTTLES DRAWN AEROBIC AND ANAEROBIC Blood Culture adequate volume Performed at [place] [address] [place], [place] [address]    Culture   Final    NO  GROWTH 2 DAYS Performed at [facility], [place] [address] [place], [place] [address]    Report Status PENDING  Incomplete  Blood Culture (routine x 2)     Status: None (Preliminary result)   Collection Time: 12/24/21 12:10 AM   Specimen: BLOOD  Result Value Ref Range Status   Specimen Description   Final    BLOOD BLOOD LEFT FOREARM Performed at [place] [address], [place], [place] [address]     Special Requests   Final    BOTTLES DRAWN AEROBIC AND ANAEROBIC Blood Culture adequate volume Performed at [place] [address], [place], [place] [address]    Culture   Final    NO GROWTH 2 DAYS Performed at [place] [address], [place], [place] [address]    Report Status PENDING  Incomplete  Urine Culture     Status: None   Collection Time: 12/24/21  2:13 AM   Specimen: In/Out Cath Urine  Result Value Ref Range Status   Specimen Description   Final    IN/OUT CATH URINE Performed at [place] [address], [place], [place] [address]    Special Requests   Final    Normal Performed at [facility], [place] [address] [place], [place] [address]    Culture   Final    NO GROWTH Performed at [facility], [place] [address], [place], [place] [address]    Report Status 12/25/2021 FINAL  Final  Respiratory (~20 pathogens) panel by PCR     Status: None   Collection Time: 12/24/21  4:53 AM   Specimen: Nasopharyngeal Swab; Respiratory  Result Value Ref Range Status   Adenovirus NOT DETECTED NOT DETECTED Final   Coronavirus 229E NOT DETECTED NOT DETECTED Final    Comment: (NOTE) The Coronavirus on the Respiratory Panel, DOES NOT test for the novel  Coronavirus (2019 nCoV)    Coronavirus HKU1 NOT DETECTED NOT DETECTED Final   Coronavirus NL63 NOT DETECTED NOT DETECTED Final   Coronavirus OC43 NOT DETECTED NOT DETECTED Final   Metapneumovirus NOT DETECTED NOT DETECTED Final   Rhinovirus / Enterovirus NOT DETECTED NOT DETECTED Final   Influenza A NOT DETECTED NOT DETECTED Final   Influenza B NOT DETECTED NOT DETECTED Final   Parainfluenza Virus 1 NOT DETECTED NOT DETECTED Final   Parainfluenza Virus 2 NOT DETECTED NOT DETECTED Final   Parainfluenza Virus 3 NOT DETECTED NOT DETECTED Final   Parainfluenza Virus 4 NOT DETECTED NOT DETECTED Final   Respiratory Syncytial Virus NOT DETECTED NOT DETECTED  Final   Bordetella pertussis NOT DETECTED NOT DETECTED Final   Bordetella Parapertussis NOT DETECTED NOT DETECTED Final   Chlamydophila pneumoniae NOT DETECTED NOT DETECTED Final   Mycoplasma pneumoniae NOT DETECTED NOT DETECTED Final    Comment: Performed at [facility], [place]. [address] [place], [place] [address]  Expectorated Sputum Assessment w Gram Stain, Rflx to Resp Cult     Status: None   Collection Time: 12/24/21  9:02 AM   Specimen: Expectorated Sputum  Result Value Ref Range Status   Specimen Description EXPECTORATED SPUTUM  Final   Special Requests NONE  Final   Sputum evaluation   Final    THIS SPECIMEN IS ACCEPTABLE FOR SPUTUM CULTURE Performed at [facility], [place] [address] [place], [place] [address]    Report Status 12/24/2021 FINAL  Final  Culture, Respiratory w Gram Stain  Status: None (Preliminary result)   Collection Time: 12/24/21  9:02 AM  Result Value Ref Range Status   Specimen Description   Final    EXPECTORATED SPUTUM Performed at [place] [address], [place], [place] [address]    Special Requests   Final    NONE Reflexed from [phone] Performed at [facility], [place] [address], [place], [place] [address]    Gram Stain   Final    FEW SQUAMOUS EPITHELIAL CELLS PRESENT MODERATE WBC PRESENT,BOTH PMN AND MONONUCLEAR MODERATE GRAM POSITIVE COCCI IN PAIRS AND CHAINS FEW GRAM NEGATIVE RODS    Culture   Final    CULTURE REINCUBATED FOR BETTER GROWTH Performed at [facility], [place] [address] [place], [place] [address]    Report Status PENDING  Incomplete      Radiology Studies: No results found.    LOS: 2 days    Cordelia Poche, MD [facility] 12/26/2021, 1:57 PM   If 7PM-7AM, please contact night-coverage [email or web address]

## 2021-12-27 ENCOUNTER — Encounter (HOSPITAL_COMMUNITY)
Admission: EM | Disposition: A | Discharge status: Home / Self Care | Payer: Self-pay | Source: Home / Self Care | Attending: Family Medicine

## 2021-12-27 ENCOUNTER — Inpatient Hospital Stay (HOSPITAL_COMMUNITY): Payer: BC Managed Care – PPO | Admitting: Anesthesiology

## 2021-12-27 ENCOUNTER — Inpatient Hospital Stay (HOSPITAL_COMMUNITY): Payer: BC Managed Care – PPO

## 2021-12-27 ENCOUNTER — Encounter (HOSPITAL_COMMUNITY): Payer: Self-pay | Admitting: Family Medicine

## 2021-12-27 DIAGNOSIS — J181 Lobar pneumonia, unspecified organism: Secondary | ICD-10-CM | POA: Diagnosis not present

## 2021-12-27 HISTORY — PX: BRONCHIAL WASHINGS: SHX5105

## 2021-12-27 HISTORY — PX: VIDEO BRONCHOSCOPY: SHX5072

## 2021-12-27 LAB — BODY FLUID CELL COUNT WITH DIFFERENTIAL
Eos, Fluid: 0 %
Eos, Fluid: 1 %
Lymphs, Fluid: 2 %
Lymphs, Fluid: 3 %
Monocyte-Macrophage-Serous Fluid: 56 % (ref 50–90)
Monocyte-Macrophage-Serous Fluid: 75 % (ref 50–90)
Neutrophil Count, Fluid: 22 % (ref 0–25)
Neutrophil Count, Fluid: 41 % — ABNORMAL HIGH (ref 0–25)
Total Nucleated Cell Count, Fluid: 10 cu mm (ref 0–1000)
Total Nucleated Cell Count, Fluid: 18 cu mm (ref 0–1000)

## 2021-12-27 LAB — CULTURE, RESPIRATORY W GRAM STAIN: Culture: NORMAL

## 2021-12-27 LAB — GLUCOSE, CAPILLARY
Glucose-Capillary: 105 mg/dL — ABNORMAL HIGH (ref 70–99)
Glucose-Capillary: 143 mg/dL — ABNORMAL HIGH (ref 70–99)

## 2021-12-27 SURGERY — VIDEO BRONCHOSCOPY WITHOUT FLUORO
Anesthesia: General

## 2021-12-27 MED ORDER — LACTATED RINGERS IV SOLN: INTRAVENOUS | Status: DC | PRN

## 2021-12-27 MED ORDER — OXYCODONE HCL 5 MG PO TABS: 5.0000 mg | ORAL_TABLET | Freq: Four times a day (QID) | ORAL | 0 refills | Status: AC | PRN

## 2021-12-27 MED ORDER — FENTANYL CITRATE (PF) 100 MCG/2ML IJ SOLN
INTRAMUSCULAR | Status: AC
  Filled 2021-12-27: qty 2

## 2021-12-27 MED ORDER — PROPOFOL 10 MG/ML IV BOLUS
INTRAVENOUS | Status: DC | PRN
  Administered 2021-12-27: 150 mg via INTRAVENOUS

## 2021-12-27 MED ORDER — PROPOFOL 10 MG/ML IV BOLUS
INTRAVENOUS | Status: AC
  Filled 2021-12-27: qty 20

## 2021-12-27 MED ORDER — ROCURONIUM BROMIDE 10 MG/ML (PF) SYRINGE
PREFILLED_SYRINGE | INTRAVENOUS | Status: DC | PRN
  Administered 2021-12-27: 60 mg via INTRAVENOUS

## 2021-12-27 MED ORDER — LIDOCAINE 2% (20 MG/ML) 5 ML SYRINGE
INTRAMUSCULAR | Status: DC | PRN
  Administered 2021-12-27: 60 mg via INTRAVENOUS

## 2021-12-27 MED ORDER — FENTANYL CITRATE (PF) 100 MCG/2ML IJ SOLN: 25.0000 ug | INTRAMUSCULAR | Status: DC | PRN

## 2021-12-27 MED ORDER — DEXAMETHASONE SODIUM PHOSPHATE 10 MG/ML IJ SOLN
INTRAMUSCULAR | Status: DC | PRN
  Administered 2021-12-27: 10 mg via INTRAVENOUS

## 2021-12-27 MED ORDER — ONDANSETRON HCL 4 MG/2ML IJ SOLN: 4.0000 mg | Freq: Once | INTRAMUSCULAR | Status: DC | PRN

## 2021-12-27 MED ORDER — ONDANSETRON HCL 4 MG/2ML IJ SOLN
INTRAMUSCULAR | Status: DC | PRN
  Administered 2021-12-27: 4 mg via INTRAVENOUS

## 2021-12-27 MED ORDER — FENTANYL CITRATE (PF) 100 MCG/2ML IJ SOLN
INTRAMUSCULAR | Status: DC | PRN
  Administered 2021-12-27 (×2): 50 ug via INTRAVENOUS

## 2021-12-27 MED ORDER — CEFDINIR 300 MG PO CAPS: 300.0000 mg | ORAL_CAPSULE | Freq: Two times a day (BID) | ORAL | 0 refills | Status: AC

## 2021-12-27 NOTE — Progress Notes (Signed)
NAME:  Thomas [name], MRN:  [id number], DOB:  12/20/1960, LOS: 3 ADMISSION DATE:  12/23/2021, CONSULTATION DATE:  11/24 REFERRING MD: Dr. Loleta Books, CHIEF COMPLAINT: Cough, Fever, N/V   History of Present Illness:  61 y/o M who presented to [facility] ER  on 11/23 with reports of nausea, vomiting, fever to 104, shaking chills, & cough with bloody sputum production.   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 Retired from [employer]. He smoked in his 79's to age 53, approximately 1/2-1ppd, quid 1990.  He lives with his wife and is independent of all ADL's.   He reports he & his wife had COVID in September (9/20) and was treated with a medrol dose pack. He feels he has not completely recovered from a pulmonary standpoint since the illness. He was seen in the pulmonary clinic on 11/6 by Dr. Vaughan Browner. He was treated with a Z-Pak and Medrol dose pack.  The patient reports he did not feel better after taking. He was seen by his PCP on 11/13 and felt to have a RML PNA and was given 10 days of Augmentin + nebulizer Q4 if needed. The patient again did not feel he had resolution of symptoms.    He presented to [facility] on 11/23 with reports of ongoing weakness, body aches, decreased appetite over several weeks.  In addition, on Thanksgiving he developed shaking chills, fever to 104, nausea, vomiting & diarrhea.  He isolated himself from the family but felt so weak he was concerned he might pass out. This prompted evaluation in the ER.  In the ER, he was treated with IV fluid resuscitation, &  empiric broad spectrum antibiotics. Pan cultures were obtained. Influenza A/B negative, COVID negative, extended RVP negative.  Blood cultures pending.  He was admitted per [facility] for further evaluation.   He has baseline acid reflux but has felt this has been under control with medications. He reports he had his nasal polyps removed and feel they have not returned.  He notes he has been coughing up blood recently -  he woke with one episode of bright red blood.  Since that time, he has had sputum mixed with blood, old blood and pink secretions.  He had a CT angio of the chest which showed a 88m subpleural pulmonary nodule, old scarring on left and new right sided multi-lobar infiltrates.     PCCM consulted for pulmonary evaluation.   Pertinent  Medical History  GERD Back Pain  Allergies  Moderate Persistent Asthma - diagnosed in approx 2018, on fluticasone-vilanterol, PRN albuterol  Nasal Polyposis  Peripheral Eosinophilia  Possible ASA Allergy  Chronic Sinusitis  Sigmoid Colectomy   Significant Hospital Events: Including procedures, antibiotic start and stop dates in addition to other pertinent events   11/23 Admit  11/24 PCCM consulted for pulmonary evaluation   Interim History / Subjective:   Continues to have cough.  States he still having some blood in the mucus  Objective   Blood pressure 136/67, pulse 82, temperature 98.3 F (36.8 C), temperature source Oral, resp. rate (!) 25, height _0  (1.803 m), weight 109 kg, SpO2 97 %.        Intake/Output Summary (Last 24 hours) at 12/27/2021 1324 Last data filed at 12/27/2021 0418 Gross per 24 hour  Intake 102.25 ml  Output 300 ml  Net -197.75 ml   Filed Weights   12/24/21 0405 12/25/21 0500 12/26/21 0422  Weight: 109.9 kg 108 kg  109 kg   Physical Exam: Gen:      No acute distress HEENT:  EOMI, sclera anicteric Neck:     No masses; no thyromegaly Lungs:    Clear to auscultation bilaterally; normal respiratory effort CV:         Regular rate and rhythm; no murmurs Abd:      + bowel sounds; soft, non-tender; no palpable masses, no distension Ext:    No edema; adequate peripheral perfusion Skin:      Warm and dry; no rash Neuro: alert and oriented x 3 Psych: normal mood and affect   Labs reviewed ANA neg, ANCA pending ESR, CRP mildly elevated Urine strep neg, legionella pending    Resolved Hospital Problem list      Assessment & Plan:   Bilateral pulmonary infiltrates, hemoptysis, mild fever Moderate persistent asthma with peripheral eosinophilia, hx nasal polyps  Recent COVID-19 infection Subpleural 55m lung nodule - no follow-up indicated   History and data reviewed. Persistent respiratory symptoms in the last two months since his initial COVID dx on 10/20/21 followed by treatment for recurrent pneumonia in November x 2. Pulmonary infiltrates likely related to current pneumonia with slow resolution in setting of prior COVID infection/scarring. Possible aspiration in setting of N/V may have contributed as well. Ddx also includes eosinophilic pneumonias with hx of peripheral eosinophilia and asthma. Low suspicion for acute eosinophilic pneumonia/pneumonitis given the time course however CEP and EGPA could be considered. Bronchoscopy would be helpful in ruling this out. UA without blood   -Continue CAP coverage for 5-7 days, scheduled nebs --Will proceed with bronchoscopy and bronchoalveolar lavage today -Rescheduled DVT ppx for 11/28 -Continue Dulera. Patient would like Symbicort as an outpatient. FAILED Breo due to ineffectiveness -Continue singulair  Best Practice (right click and "Reselect all SmartList Selections" daily)  Per [facility]      PMarshell GarfinkelMD [facility] See Amion for pager  If no response to pager , please call [phone] until 7pm After 7:00 pm call Elink  [phone]/27/2023, 1:28 PM

## 2021-12-27 NOTE — Op Note (Signed)
[facility] Cardiopulmonary Patient Name: Thomas [name] Procedure Date: 12/27/2021 MRN: [id number] Attending MD: Marshell Garfinkel , MD, [id number] Date of Birth: 18-Feb-1960 CSN: [id number] Age: 61 Admit Type: Inpatient Ethnicity: Not Hispanic or Latino Procedure:             Bronchoscopy Indications:           Abnormal CT scan of chest Providers:             Marshell Garfinkel, MD, Doristine Johns, RN, Cletis Athens,                         Technician Referring MD:           Medicines:             General Anesthesia Complications:         No immediate complications Estimated Blood Loss:  Estimated blood loss: none. Procedure:      Pre-Anesthesia Assessment:      - A History and Physical has been performed. Patient meds and allergies       have been reviewed. The risks and benefits of the procedure and the       sedation options and risks were discussed with the patient. All       questions were answered and informed consent was obtained. Patient       identification and proposed procedure were verified prior to the       procedure by the physician in the procedure room. Mental Status       Examination: alert and oriented. Airway Examination: normal       oropharyngeal airway. Respiratory Examination: clear to auscultation. CV       Examination: RRR, no murmurs, no S3 or S4. ASA Grade Assessment: II - A       patient with mild systemic disease. After reviewing the risks and       benefits, the patient was deemed in satisfactory condition to undergo       the procedure. The anesthesia plan was to use general anesthesia.       Immediately prior to administration of medications, the patient was       re-assessed for adequacy to receive sedatives. The heart rate,       respiratory rate, oxygen saturations, blood pressure, adequacy of       pulmonary ventilation, and response to care were monitored throughout       the procedure. The physical status of the patient was  re-assessed after       the procedure.      After obtaining informed consent, the bronchoscope was passed under       direct vision. Throughout the procedure, the patient's blood pressure,       pulse, and oxygen saturations were monitored continuously. the BF-H190       ([id number]) Olympus bronchoscope was introduced through the mouth, via the       endotracheal tube (the patient was intubated for the procedure) and       advanced to the tracheobronchial tree of both lungs. Findings:      Respiratory tract: Normal anatomy, no bleeding noted. Thin secretions       noted in the left upper lobe.      The bronchoscope was advanced until wedged at the desired location for       bronchoalveolar lavage. BAL was performed in the RML medial segment (B5)  and in the RLL medial basal segment (B7) of the lung and sent for cell       count and differential, routine cytology and Gram stain. 180 mL of fluid       were instilled. 100 mL were returned. The return was cloudy. There were       no mucoid plugs in the return fluid. Multiple specimens were obtained       and pooled into one specimen, which was sent for analysis. Impression:      - Abnormal CT scan of chest      - The airway examination was normal.      - Bronchoalveolar lavage was performed.      - The airway examination was normal. Moderate Sedation:      General anesthesia Recommendation:      - Await BAL results. Procedure Code(s):      --- Professional ---      [phone], Bronchoscopy, rigid or flexible, including fluoroscopic guidance,       when performed; with bronchial alveolar lavage Diagnosis Code(s):      --- Professional ---      R93.89, Abnormal findings on diagnostic imaging of other specified body       structures CPT copyright 2022 American Medical Association. All rights reserved. The codes documented in this report are preliminary and upon coder review may  be revised to meet current compliance requirements. Marshell Garfinkel, MD Marshell Garfinkel, MD 12/27/2021 1:43:18 PM Number of Addenda: 0 Scope In: Scope Out:

## 2021-12-27 NOTE — Transfer of Care (Signed)
Immediate Anesthesia Transfer of Care Note  Patient: Thomas [name]  Procedure(s) Performed: VIDEO BRONCHOSCOPY WITHOUT FLUORO BRONCHIAL WASHINGS  Patient Location: PACU  Anesthesia Type:General  Level of Consciousness: sedated  Airway & Oxygen Therapy: Patient Spontanous Breathing and Patient connected to face mask oxygen  Post-op Assessment: Report given to RN and Post -op Vital signs reviewed and stable  Post vital signs: Reviewed and stable  Last Vitals:  Vitals Value Taken Time  BP 153/87 12/27/21 1330  Temp 36.4 C 12/27/21 1329  Pulse 104 12/27/21 1340  Resp 29 12/27/21 1340  SpO2 94 % 12/27/21 1340  Vitals shown include unvalidated device data.  Last Pain:  Vitals:   12/27/21 1340  TempSrc:   PainSc: 0-No pain      Patients Stated Pain Goal: 3 (82/64/15 8309)  Complications: No notable events documented.

## 2021-12-27 NOTE — Discharge Summary (Signed)
Physician Discharge Summary   Patient: Thomas [name] MRN: [id number] DOB: Jul 22, 1960  Admit date:     12/23/2021  Discharge date: 12/27/21  Discharge Physician: Cordelia Poche, MD   PCP: Laurey Morale, MD   Recommendations at discharge:  Hospital follow-up with PCP Hospital follow-up with pulmonology Cytology results Fungal culture, respiratory culture, AFB, acid fast culture results from bronchoscopy  Discharge Diagnoses: Principal Problem:   Lobar pneumonia ([place]) Active Problems:   Obesity (BMI 30-39.9)   Type 2 diabetes mellitus without complications ([place])   Asthma  Resolved Problems:   Community acquired pneumonia of right middle lobe of lung  Hospital Course: Thomas [name] is a 61 y.o. male with a history of diabetes mellitus type 2, obesity, asthma, nasal polyposis.  Patient presented secondary to cough and fever in addition to shortness of breath and found to have evidence of recurrent pneumonia.  Empiric vancomycin/cefepime/Flagyl started on admission with transition to ceftriaxone IV.  Secondary to recurrent nature, pulmonology was consulted who recommended bronchoscopy with BAL.  Patient is improving with antibiotic therapy at this time.  Assessment and Plan: * Lobar pneumonia ([place]) Community acquired pneumonia of right middle lob; failed outpatient treatment. Patient start on empiric Vancomycin/Cefepime/Flagyl and transitioned to Ceftriaxone IV. Sputum culture obtained and is pending. Pulmonology consulted and plan bronchoscopy on 11/27. -Continue Ceftriaxone -Follow pulmonology recommendations -Follow-up sputum culture  Asthma Patient is on Singulair, Breo Ellipta (previously on Symbicort), albuterol as an outpatient.  Patient has requested to be transitioned back to Symbicort.  Inhaled corticosteroid held secondary to pneumonia diagnosis. -Continue Singulair, DuoNeb -Continue Protonix  Type 2 diabetes mellitus without complications ([place]) Well controlled.  Hemoglobin A1C of 6.3%. Patient managed with diet modification alone. Started on SSI while inpatient. -Continue SSI while inpatient -Continue carb modified diet  Obesity (BMI 30-39.9) Estimated body mass index is 33.52 kg/m as calculated from the following:   Height as of this encounter: _0  (1.803 m).   Weight as of this encounter: 109 kg.  GERD Stable. Continue PPI.    Consultants: Pulmonology Procedures performed: Bronchoscopy  Disposition: Home Diet recommendation: Carb modified diet   DISCHARGE MEDICATION: Allergies as of 12/27/2021       Reactions   No Known Allergies         Medication List     STOP taking these medications    fluticasone furoate-vilanterol 200-25 MCG/ACT Aepb Commonly known as: Breo Ellipta       TAKE these medications    albuterol 108 (90 Base) MCG/ACT inhaler Commonly known as: ProAir HFA INHALE 2 PUFFS EVERY 6 HOURS AS NEEDED FOR WHEEZING OR SHORTNESS OF BREATH.   albuterol (2.5 MG/3ML) 0.083% nebulizer solution Commonly known as: PROVENTIL Take 3 mLs (2.5 mg total) by nebulization every 4 (four) hours as needed for wheezing or shortness of breath.   cefdinir 300 MG capsule Commonly known as: OMNICEF Take 1 capsule (300 mg total) by mouth 2 (two) times daily for 3 days. Start taking on: December 28, 2021   fluticasone 50 MCG/ACT nasal spray Commonly known as: FLONASE SPRAY 2 SPRAYS INTO EACH NOSTRIL EVERY DAY   lansoprazole 15 MG capsule Commonly known as: PREVACID Take 1 capsule (15 mg total) by mouth daily.   montelukast 10 MG tablet Commonly known as: SINGULAIR TAKE 1 TABLET BY MOUTH EVERY DAY   oxyCODONE 5 MG immediate release tablet Commonly known as: Oxy IR/ROXICODONE Take 1 tablet (5 mg total) by mouth every 6 (six) hours as needed for up  to 3 days for moderate pain.   Symbicort 160-4.5 MCG/ACT inhaler Generic drug: budesonide-formoterol Inhale 2 puffs into the lungs in the morning and at bedtime.    Vitamin D (Ergocalciferol) 1.25 MG (50000 UNIT) Caps capsule Commonly known as: DRISDOL Take 1 capsule (50,000 Units total) by mouth every 14 (fourteen) days.        Follow-up Information     Laurey Morale, MD. Schedule an appointment as soon as possible for a visit in 1 week(s).   Specialty: Family Medicine Why: For hospital follow-up Contact information: [place] [address] [phone]         Marshell Garfinkel, MD Follow up in 1 week(s).   Specialty: Pulmonary Disease Why: For hospital follow-up. Cytology results. Contact information: [address] [place] [address] [phone]                Discharge Exam: BP (!) 144/76   Pulse (!) 103   Temp 97.6 F (36.4 C) (Temporal)   Resp 20   Ht _0  (1.803 m)   Wt 109 kg   SpO2 91%   BMI 33.52 kg/m   General exam: Appears calm and comfortable Respiratory system: Clear to auscultation. Respiratory effort normal. Cardiovascular system: S1 & S2 heard, RRR. No murmurs, rubs, gallops or clicks. Gastrointestinal system: Abdomen is nondistended, soft and nontender. No organomegaly or masses felt. Normal bowel sounds heard. Central nervous system: Alert and oriented. No focal neurological deficits. Musculoskeletal: No edema. No calf tenderness Skin: No cyanosis. No rashes Psychiatry: Judgement and insight appear normal. Mood & affect appropriate.   Condition at discharge: stable  The results of significant diagnostics from this hospitalization (including imaging, microbiology, ancillary and laboratory) are listed below for reference.   Imaging Studies: DG CHEST PORT 1 VIEW  Result Date: 12/27/2021 CLINICAL DATA:  Status post bronchoscopy. EXAM: PORTABLE CHEST 1 VIEW COMPARISON:  Chest radiograph 12/23/2021 and chest CTA 12/24/2021 FINDINGS: The cardiomediastinal silhouette is unchanged with normal heart size. Lung volumes are low. Patchy airspace opacities in the right  lung base and, to a lesser extent, both mid lungs have progressed. No sizable pleural effusion or pneumothorax is identified. Prior cervical spine fusion is noted. IMPRESSION: Low lung volumes with worsening right greater than left lung airspace opacities compatible with pneumonia. Electronically Signed   By: Logan Bores M.D.   On: 12/27/2021 14:00   CT Angio Chest PE W and/or Wo Contrast  Result Date: 12/24/2021 CLINICAL DATA:  Shortness of breath. EXAM: CT ANGIOGRAPHY CHEST WITH CONTRAST TECHNIQUE: Multidetector CT imaging of the chest was performed using the standard protocol during bolus administration of intravenous contrast. Multiplanar CT image reconstructions and MIPs were obtained to evaluate the vascular anatomy. RADIATION DOSE REDUCTION: This exam was performed according to the departmental dose-optimization program which includes automated exposure control, adjustment of the mA and/or kV according to patient size and/or use of iterative reconstruction technique. CONTRAST:  45m OMNIPAQUE IOHEXOL 350 MG/ML SOLN COMPARISON:  Chest radiograph dated 12/24/2021. FINDINGS: Evaluation of this exam is limited due to respiratory motion artifact. Cardiovascular: There is no cardiomegaly or pericardial effusion. The thoracic aorta is unremarkable. The origins of the great vessels of the aortic arch are patent. Evaluation of the pulmonary arteries is very limited due to respiratory motion. No pulmonary artery embolus identified. Mediastinum/Nodes: No hilar or mediastinal adenopathy. The esophagus is grossly unremarkable. No mediastinal fluid collection. Lungs/Pleura: A 3 mm right upper lobe subpleural nodule (29/6). Bilateral  patchy airspace ground-glass opacities most consistent with multi lobar pneumonia. Clinical correlation is recommended. No pleural effusion or pneumothorax. The central airways are patent. Upper Abdomen: No acute abnormality. Musculoskeletal: No acute osseous pathology. Partially  visualized lower cervical ACDF. Review of the MIP images confirms the above findings. IMPRESSION: 1. No CT evidence of pulmonary artery embolus. 2. Multi lobar pneumonia. 3. A 3 mm right upper lobe subpleural nodule. No follow-up needed if patient is low-risk.This recommendation follows the consensus statement: Guidelines for Management of Incidental Pulmonary Nodules Detected on CT Images: From the Fleischner Society 2017; Radiology 2017; 284:228-243. Electronically Signed   By: Anner Crete M.D.   On: 12/24/2021 02:26   DG Chest Port 1 View  Result Date: 12/24/2021 CLINICAL DATA:  Possible sepsis.  Cold and flu symptoms for 3 days. EXAM: PORTABLE CHEST 1 VIEW COMPARISON:  12/13/2021. FINDINGS: The heart size and mediastinal contours are within normal limits. Minimal subsegmental atelectasis or scarring at the left lung base. No effusion or pneumothorax. Cervical spinal fusion hardware is noted. Degenerative changes are present thoracic spine. IMPRESSION: No active disease. Electronically Signed   By: Brett Fairy M.D.   On: 12/24/2021 00:07   DG Chest 2 View  Result Date: 12/14/2021 CLINICAL DATA:  Cough.  Right middle lobe rales. EXAM: CHEST - 2 VIEW COMPARISON:  08/12/2017 FINDINGS: The examination is limited by grid lines. Normal sized heart. Moderate peribronchial thickening. Clear lungs. Thoracic spine degenerative changes. Cervical spine fixation hardware. IMPRESSION: Moderate bronchitic changes. Electronically Signed   By: Claudie Revering M.D.   On: 12/14/2021 20:59    Microbiology: Results for orders placed or performed during the hospital encounter of 12/23/21  Resp Panel by RT-PCR (Flu A&B, Covid) Anterior Nasal Swab     Status: None   Collection Time: 12/24/21 12:00 AM   Specimen: Anterior Nasal Swab  Result Value Ref Range Status   SARS Coronavirus 2 by RT PCR NEGATIVE NEGATIVE Final    Comment: (NOTE) SARS-CoV-2 target nucleic acids are NOT DETECTED.  The SARS-CoV-2 RNA is  generally detectable in upper respiratory specimens during the acute phase of infection. The lowest concentration of SARS-CoV-2 viral copies this assay can detect is 138 copies/mL. A negative result does not preclude SARS-Cov-2 infection and should not be used as the sole basis for treatment or other patient management decisions. A negative result may occur with  improper specimen collection/handling, submission of specimen other than nasopharyngeal swab, presence of viral mutation(s) within the areas targeted by this assay, and inadequate number of viral copies(<138 copies/mL). A negative result must be combined with clinical observations, patient history, and epidemiological information. The expected result is Negative.  Fact Sheet for Patients:  EntrepreneurPulse.com.au  Fact Sheet for Healthcare Providers:  IncredibleEmployment.be  This test is no t yet approved or cleared by the [place] FDA and  has been authorized for detection and/or diagnosis of SARS-CoV-2 by FDA under an Emergency Use Authorization (EUA). This EUA will remain  in effect (meaning this test can be used) for the duration of the COVID-19 declaration under Section 564(b)(1) of the Act, 21 U.S.C.section 360bbb-3(b)(1), unless the authorization is terminated  or revoked sooner.       Influenza A by PCR NEGATIVE NEGATIVE Final   Influenza B by PCR NEGATIVE NEGATIVE Final    Comment: (NOTE) The Xpert Xpress SARS-CoV-2/FLU/RSV plus assay is intended as an aid in the diagnosis of influenza from Nasopharyngeal swab specimens and should not be used as a sole basis  for treatment. Nasal washings and aspirates are unacceptable for Xpert Xpress SARS-CoV-2/FLU/RSV testing.  Fact Sheet for Patients: EntrepreneurPulse.com.au  Fact Sheet for Healthcare Providers: IncredibleEmployment.be  This test is not yet approved or cleared by the [place] FDA and has been authorized for detection and/or diagnosis of SARS-CoV-2 by FDA under an Emergency Use Authorization (EUA). This EUA will remain in effect (meaning this test can be used) for the duration of the COVID-19 declaration under Section 564(b)(1) of the Act, 21 U.S.C. section 360bbb-3(b)(1), unless the authorization is terminated or revoked.  Performed at [facility], [place] [address] [place], [place] [address]   Blood Culture (routine x 2)     Status: None (Preliminary result)   Collection Time: 12/24/21 12:01 AM   Specimen: BLOOD  Result Value Ref Range Status   Specimen Description   Final    BLOOD BLOOD RIGHT FOREARM Performed at [facility] [address], [place], [place] [address]    Special Requests   Final    BOTTLES DRAWN AEROBIC AND ANAEROBIC Blood Culture adequate volume Performed at [facility] [address], [place], [place] [address]    Culture   Final    NO GROWTH 3 DAYS Performed at [facility], [place] [address] [place], [place] [address]    Report Status PENDING  Incomplete  Blood Culture (routine x 2)     Status: None (Preliminary result)   Collection Time: 12/24/21 12:10 AM   Specimen: BLOOD  Result Value Ref Range Status   Specimen Description   Final    BLOOD BLOOD LEFT FOREARM Performed at [facility] [address], [place], [place] [address]    Special Requests   Final    BOTTLES DRAWN AEROBIC AND ANAEROBIC Blood Culture adequate volume Performed at [facility] [address] [place], [place] [address]    Culture   Final    NO GROWTH 3 DAYS Performed at [facility], [place] [address] [place], [place] [address]    Report Status PENDING  Incomplete  Urine Culture     Status: None   Collection Time: 12/24/21  2:13 AM   Specimen: In/Out Cath Urine  Result Value Ref Range Status   Specimen Description   Final     IN/OUT CATH URINE Performed at [facility] [address] [place], [place] [address]    Special Requests   Final    Normal Performed at [facility], [place] [address], [place], [place] [address]    Culture   Final    NO GROWTH Performed at [facility], [place] [address] [place], [place] [address]    Report Status 12/25/2021 FINAL  Final  Respiratory (~20 pathogens) panel by PCR     Status: None   Collection Time: 12/24/21  4:53 AM   Specimen: Nasopharyngeal Swab; Respiratory  Result Value Ref Range Status   Adenovirus NOT DETECTED NOT DETECTED Final   Coronavirus 229E NOT DETECTED NOT DETECTED Final    Comment: (NOTE) The Coronavirus on the Respiratory Panel, DOES NOT test for the novel  Coronavirus (2019 nCoV)    Coronavirus HKU1 NOT DETECTED NOT DETECTED Final   Coronavirus NL63 NOT DETECTED NOT DETECTED Final   Coronavirus OC43 NOT DETECTED NOT DETECTED Final   Metapneumovirus NOT DETECTED NOT DETECTED Final   Rhinovirus / Enterovirus NOT DETECTED NOT DETECTED Final   Influenza A NOT DETECTED  NOT DETECTED Final   Influenza B NOT DETECTED NOT DETECTED Final   Parainfluenza Virus 1 NOT DETECTED NOT DETECTED Final   Parainfluenza Virus 2 NOT DETECTED NOT DETECTED Final   Parainfluenza Virus 3 NOT DETECTED NOT DETECTED Final   Parainfluenza Virus 4 NOT DETECTED NOT DETECTED Final   Respiratory Syncytial Virus NOT DETECTED NOT DETECTED Final   Bordetella pertussis NOT DETECTED NOT DETECTED Final   Bordetella Parapertussis NOT DETECTED NOT DETECTED Final   Chlamydophila pneumoniae NOT DETECTED NOT DETECTED Final   Mycoplasma pneumoniae NOT DETECTED NOT DETECTED Final    Comment: Performed at [facility], [place] [address], [place], [place] [address]  Expectorated Sputum Assessment w Gram Stain, Rflx to Resp Cult     Status: None   Collection Time: 12/24/21  9:02 AM   Specimen: Expectorated Sputum  Result Value Ref Range Status    Specimen Description EXPECTORATED SPUTUM  Final   Special Requests NONE  Final   Sputum evaluation   Final    THIS SPECIMEN IS ACCEPTABLE FOR SPUTUM CULTURE Performed at [facility], [place] [address], [place], [place] [address]    Report Status 12/24/2021 FINAL  Final  Culture, Respiratory w Gram Stain     Status: None   Collection Time: 12/24/21  9:02 AM  Result Value Ref Range Status   Specimen Description   Final    EXPECTORATED SPUTUM Performed at [facility], [place] [address] [place], [place] [address]    Special Requests   Final    NONE Reflexed from [id number] Performed at [facility], [place] [address] [place], [place] [address]    Gram Stain   Final    FEW SQUAMOUS EPITHELIAL CELLS PRESENT MODERATE WBC PRESENT,BOTH PMN AND MONONUCLEAR MODERATE GRAM POSITIVE COCCI IN PAIRS AND CHAINS FEW GRAM NEGATIVE RODS    Culture   Final    ABUNDANT Normal respiratory flora-no Staph aureus or Pseudomonas seen Performed at [facility], [address], [place], [place] [address]    Report Status 12/27/2021 FINAL  Final    Labs: CBC: Recent Labs  Lab 12/24/21 0000 12/25/21 0421 12/26/21 0350  WBC 12.3* 12.6* 10.2  NEUTROABS 10.5*  --   --   HGB 13.0 12.1* 11.9*  HCT 37.6* 35.2* 34.4*  MCV 92.2 92.1 92.2  PLT 263 234 784   Basic Metabolic Panel: Recent Labs  Lab 12/24/21 0000 12/25/21 0421 12/26/21 0350  NA 136 139 138  K 3.8 3.6 3.8  CL 107 107 105  CO2 21* 25 26  GLUCOSE 220* 137* 132*  BUN _0 CREATININE 1.26* 0.99 0.93  CALCIUM 8.9 8.5* 8.9   Liver Function Tests: Recent Labs  Lab 12/24/21 0000 12/26/21 0350  AST 26 15  ALT 29 19  ALKPHOS 67 53  BILITOT 0.6 0.6  PROT 7.0 6.4*  ALBUMIN 4.2 3.2*   CBG: Recent Labs  Lab 12/26/21 1223 12/26/21 1726 12/26/21 2128 12/27/21 0817 12/27/21 1151  GLUCAP 182* 98 159* 105* 143*    Discharge time spent: 35 minutes.  Signed: Cordelia Poche, MD [facility] 12/27/2021

## 2021-12-27 NOTE — Anesthesia Preprocedure Evaluation (Addendum)
Anesthesia Evaluation  Patient identified by MRN, date of birth, ID band Patient awake    Reviewed: Allergy & Precautions, NPO status , Patient's Chart, lab work & pertinent test results  History of Anesthesia Complications Negative for: history of anesthetic complications  Airway Mallampati: III  TM Distance: >3 FB Neck ROM: Limited    Dental  (+) Dental Advisory Given, Teeth Intact   Pulmonary asthma , former smoker   Pulmonary exam normal        Cardiovascular negative cardio ROS Normal cardiovascular exam     Neuro/Psych  Neuromuscular disease (meralgia paresthetica)  negative psych ROS   GI/Hepatic Neg liver ROS, hiatal hernia,GERD  Medicated and Controlled,,  Endo/Other   Pre-DM Obesity   Renal/GU negative Renal ROS     Musculoskeletal negative musculoskeletal ROS (+)    Abdominal   Peds  Hematology  (+) Blood dyscrasia, anemia   Anesthesia Other Findings   Reproductive/Obstetrics                             Anesthesia Physical Anesthesia Plan  ASA: 2  Anesthesia Plan: General   Post-op Pain Management: Minimal or no pain anticipated   Induction: Intravenous  PONV Risk Score and Plan: 2 and Treatment may vary due to age or medical condition, Ondansetron and Dexamethasone  Airway Management Planned: Oral ETT and Video Laryngoscope Planned  Additional Equipment: None  Intra-op Plan:   Post-operative Plan: Extubation in OR  Informed Consent: I have reviewed the patients History and Physical, chart, labs and discussed the procedure including the risks, benefits and alternatives for the proposed anesthesia with the patient or authorized representative who has indicated his/her understanding and acceptance.     Dental advisory given  Plan Discussed with: CRNA and Anesthesiologist  Anesthesia Plan Comments:        Anesthesia Quick Evaluation

## 2021-12-27 NOTE — Anesthesia Procedure Notes (Signed)
Procedure Name: Intubation Date/Time: 12/27/2021 12:55 PM  Performed by: Lind Covert, CRNAPre-anesthesia Checklist: Patient identified, Emergency Drugs available, Suction available, Patient being monitored and Timeout performed Patient Re-evaluated:Patient Re-evaluated prior to induction Oxygen Delivery Method: Circle system utilized Preoxygenation: Pre-oxygenation with 100% oxygen Induction Type: IV induction Ventilation: Mask ventilation without difficulty Laryngoscope Size: Mac, 4 and Glidescope Grade View: Grade I Tube type: Oral Tube size: 8.5 mm Number of attempts: 1 Airway Equipment and Method: Stylet Placement Confirmation: ETT inserted through vocal cords under direct vision, positive ETCO2 and breath sounds checked- equal and bilateral Secured at: 23 cm Tube secured with: Tape Dental Injury: Teeth and Oropharynx as per pre-operative assessment

## 2021-12-27 NOTE — Anesthesia Postprocedure Evaluation (Signed)
Anesthesia Post Note  Patient: Thomas [name]  Procedure(s) Performed: VIDEO BRONCHOSCOPY WITHOUT FLUORO BRONCHIAL WASHINGS     Patient location during evaluation: PACU Anesthesia Type: General Level of consciousness: awake and alert Pain management: pain level controlled Vital Signs Assessment: post-procedure vital signs reviewed and stable Respiratory status: spontaneous breathing, nonlabored ventilation, respiratory function stable and patient connected to nasal cannula oxygen Cardiovascular status: blood pressure returned to baseline and stable Postop Assessment: no apparent nausea or vomiting Anesthetic complications: no   No notable events documented.  Last Vitals:  Vitals:   12/27/21 1400 12/27/21 1414  BP: 114/63 (!) 144/76  Pulse: 96 (!) 103  Resp: 20   Temp:    SpO2: 90% 91%                    Audry Pili

## 2021-12-28 ENCOUNTER — Other Ambulatory Visit (INDEPENDENT_AMBULATORY_CARE_PROVIDER_SITE_OTHER): Payer: Self-pay | Admitting: Bariatrics

## 2021-12-28 ENCOUNTER — Telehealth: Payer: Self-pay

## 2021-12-28 ENCOUNTER — Telehealth (INDEPENDENT_AMBULATORY_CARE_PROVIDER_SITE_OTHER): Payer: Self-pay | Admitting: Bariatrics

## 2021-12-28 ENCOUNTER — Other Ambulatory Visit: Payer: Self-pay | Admitting: Family Medicine

## 2021-12-28 DIAGNOSIS — E559 Vitamin D deficiency, unspecified: Secondary | ICD-10-CM

## 2021-12-28 DIAGNOSIS — B9689 Other specified bacterial agents as the cause of diseases classified elsewhere: Secondary | ICD-10-CM

## 2021-12-28 DIAGNOSIS — J4540 Moderate persistent asthma, uncomplicated: Secondary | ICD-10-CM

## 2021-12-28 LAB — ANCA TITERS
Atypical P-ANCA titer: <1:20 {titer}
C-ANCA: <1:20 {titer}
P-ANCA: <1:20 {titer}

## 2021-12-28 LAB — ACID FAST SMEAR (AFB, MYCOBACTERIA): Acid Fast Smear: NEGATIVE

## 2021-12-28 LAB — LEGIONELLA PNEUMOPHILA SEROGP 1 UR AG: L. pneumophila Serogp 1 Ur Ag: NEGATIVE

## 2021-12-28 LAB — CYTOLOGY - NON PAP

## 2021-12-28 NOTE — Telephone Encounter (Signed)
Transition Care Management Follow-up Telephone Call Date of discharge and from where:[facility] 12-27-21 Dx: lobar pneumonia  How have you been since you were released from the hospital? Doing ok  Any questions or concerns? No  Items Reviewed: Did the pt receive and understand the discharge instructions provided? Yes  Medications obtained and verified? Yes  Other? No  Any new allergies since your discharge? No  Dietary orders reviewed? Yes Do you have support at home? Yes   Home Care and Equipment/Supplies: Were home health services ordered? no If so, what is the name of the agency? na  Has the agency set up a time to come to the patient's home? not applicable Were any new equipment or medical supplies ordered?  No What is the name of the medical supply agency? na Were you able to get the supplies/equipment? not applicable Do you have any questions related to the use of the equipment or supplies? No  Functional Questionnaire: (I = Independent and D = Dependent) ADLs: I  Bathing/Dressing- I  Meal Prep- I  Eating- I  Maintaining continence- I  Transferring/Ambulation- I  Managing Meds- I  Follow up appointments reviewed:  PCP Hospital f/u appt confirmed? Yes  Scheduled to see Dr Sarajane Jews on 01-03-22 @ 330pm. [facility] f/u appt confirmed? Yes  Scheduled to see Dr Vaughan Browner  on 01-07-22 @ 930am. Are transportation arrangements needed? No  If their condition worsens, is the pt aware to call PCP or go to the Emergency Dept.? Yes Was the patient provided with contact information for the PCP's office or ED? Yes Was to pt encouraged to call back with questions or concerns? Yes   Juanda Crumble LPN [facility] Dial [phone]

## 2021-12-28 NOTE — Telephone Encounter (Signed)
Patient has been in the hospital with Pneumonia. Pt's Vitamin D labs are low. Pt is out of Vitamin D and needs a refill. Pt states that he cannot come in the office for a visit as he is recovering from Pneumonia. Please call Pt today as he wants to discuss a refill of Vitamin D.   [facility].

## 2021-12-29 ENCOUNTER — Ambulatory Visit: Payer: BC Managed Care – PPO | Admitting: Family Medicine

## 2021-12-29 ENCOUNTER — Encounter (HOSPITAL_COMMUNITY): Payer: Self-pay | Admitting: Pulmonary Disease

## 2021-12-29 ENCOUNTER — Telehealth: Payer: Self-pay

## 2021-12-29 ENCOUNTER — Other Ambulatory Visit (INDEPENDENT_AMBULATORY_CARE_PROVIDER_SITE_OTHER): Payer: Self-pay | Admitting: Bariatrics

## 2021-12-29 DIAGNOSIS — E559 Vitamin D deficiency, unspecified: Secondary | ICD-10-CM

## 2021-12-29 LAB — CULTURE, BLOOD (ROUTINE X 2)
Culture: NO GROWTH
Culture: NO GROWTH
Special Requests: ADEQUATE
Special Requests: ADEQUATE

## 2021-12-29 MED ORDER — VITAMIN D (ERGOCALCIFEROL) 1.25 MG (50000 UNIT) PO CAPS: 50000.0000 [IU] | ORAL_CAPSULE | ORAL | 0 refills | Status: DC

## 2021-12-29 NOTE — Telephone Encounter (Signed)
Transition Care Management Follow-up Telephone Call Date of discharge and from where: 12/27/21 from [facility] for Sepsis, unspecified organism How have you been since you were released from the hospital? Pt states he's improving Any questions or concerns? No  Items Reviewed: Did the pt receive and understand the discharge instructions provided? Yes  Medications obtained and verified? Yes  Other? No  Any new allergies since your discharge? No  Dietary orders reviewed? Yes Do you have support at home? Yes   Home Care and Equipment/Supplies: Were home health services ordered? no Were any new equipment or medical supplies ordered?  No What is the name of the medical supply agency? nO   Functional Questionnaire: (I = Independent and D = Dependent) ADLs: I  Bathing/Dressing- I  Meal Prep- I  Eating- I  Maintaining continence- I  Transferring/Ambulation- I  Managing Meds- I  Follow up appointments reviewed:  PCP Hospital f/u appt confirmed? Yes  Scheduled to see Dr Sarajane Jews on 01/03/22 @ 3:30pm. [facility] f/u appt confirmed? Yes  Scheduled to see Dr Hart Robinsons on 01/07/22 @ 9:30am. Are transportation arrangements needed? No  If their condition worsens, is the pt aware to call PCP or go to the Emergency Dept.? Yes Was the patient provided with contact information for the PCP's office or ED? Yes Was to pt encouraged to call back with questions or concerns? Yes

## 2021-12-29 NOTE — Telephone Encounter (Signed)
Please advise 

## 2021-12-30 LAB — CULTURE, RESPIRATORY W GRAM STAIN
Culture: NO GROWTH
Gram Stain: NONE SEEN

## 2022-01-03 ENCOUNTER — Ambulatory Visit: Payer: BC Managed Care – PPO | Admitting: Family Medicine

## 2022-01-03 ENCOUNTER — Encounter: Payer: Self-pay | Admitting: Family Medicine

## 2022-01-03 VITALS — BP 134/70 | HR 85 | Temp 98.6°F | Wt 236.0 lb

## 2022-01-03 DIAGNOSIS — R911 Solitary pulmonary nodule: Secondary | ICD-10-CM | POA: Diagnosis not present

## 2022-01-03 DIAGNOSIS — J4540 Moderate persistent asthma, uncomplicated: Secondary | ICD-10-CM

## 2022-01-03 DIAGNOSIS — J181 Lobar pneumonia, unspecified organism: Secondary | ICD-10-CM | POA: Diagnosis not present

## 2022-01-03 MED ORDER — TRIAMCINOLONE ACETONIDE 0.1 % EX CREA: 1.0000 | TOPICAL_CREAM | Freq: Two times a day (BID) | CUTANEOUS | 2 refills | Status: AC | PRN

## 2022-01-03 NOTE — Progress Notes (Signed)
   Subjective:    Patient ID: Thomas [name], male    DOB: [birth year], 61 y.o.   MRN: [id number]  HPI Here for a transitional visit to follow up from a hospital stay from 12-23-21 to 12-27-21 for multilobar pneumonia. This likely started in the RML and then spread. He failed 2 antibiotics before he was admitted. He was treated with several IV antibiotics, and he was sent home with 3 days of Cefdinir. A CT angiogram of the chest revealed the pneumonia, and no PE was seen. They also saw a 3 mm subpleural nodule in the RUL. Blood cultures and bacterial cultures from bronchoscopy samples were negative. A respiratory viral panel was negative. An acid fast study was negative. He still has fungal cultures pending. His WBC peaked at 12.6. Renal function was normal. An A1c was 6.3%. Since his DC he has felt much better with no SOB and rare coughing. He still has generalized fatigue. He was changed back to Symbicort for maintenance, and he has albuterol per MDI and per nebulizer as needed.    Review of Systems  Constitutional:  Positive for fatigue. Negative for fever.  HENT: Negative.    Respiratory:  Positive for cough. Negative for chest tightness, shortness of breath and wheezing.   Cardiovascular: Negative.   Gastrointestinal: Negative.   Genitourinary: Negative.        Objective:   Physical Exam Constitutional:      Appearance: Normal appearance. He is not ill-appearing.  Cardiovascular:     Rate and Rhythm: Normal rate and regular rhythm.     Pulses: Normal pulses.     Heart sounds: Normal heart sounds.  Pulmonary:     Effort: Pulmonary effort is normal.     Breath sounds: Normal breath sounds.  Neurological:     Mental Status: He is alert.           Assessment & Plan:  He is recovering from a multilobar pneumonia and is doing well. No etiologic agent has been identified. We await final fungal cultures. He will return next month for a well exam, and we will follow this up at that  time. As for the lung nodule. We agreed to get a follow up chest CT in 6 months. We spent a total of ( 35  ) minutes reviewing records and discussing these issues.  Alysia Penna, MD

## 2022-01-07 ENCOUNTER — Encounter: Payer: Self-pay | Admitting: Pulmonary Disease

## 2022-01-07 ENCOUNTER — Ambulatory Visit: Payer: BC Managed Care – PPO | Admitting: Pulmonary Disease

## 2022-01-07 VITALS — BP 132/64 | HR 87 | Temp 98.2°F | Ht 71.0 in | Wt 238.0 lb

## 2022-01-07 DIAGNOSIS — J4540 Moderate persistent asthma, uncomplicated: Secondary | ICD-10-CM | POA: Diagnosis not present

## 2022-01-07 DIAGNOSIS — R911 Solitary pulmonary nodule: Secondary | ICD-10-CM | POA: Diagnosis not present

## 2022-01-07 NOTE — Patient Instructions (Addendum)
I am glad you are continuing to feel better.  All your results from the bronchoscopy are normal which is good news Continue your inhalers and Singulair Will get a follow-up CT without contrast in 6 months for lung nodule Return to clinic in 6 months after CT scan

## 2022-01-07 NOTE — Progress Notes (Signed)
Thomas [name]    [id number]    07/12/1960  Primary Care Physician:Fry, Ishmael Holter, MD  Referring Physician: Laurey Morale, MD [place],  [place] [address]  Chief complaint: Follow up for moderate persistent asthma, nasal polyposis, chronic sinusitis.  HPI: 61 y.o. with history of allergies, asthma, diabetes, GERD, dyspnea  Follows with Dr. 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   Underwent FEES in April 2019.   He was on aspirin for chronic pain but is not using it anymore.  Advised him to avoid aspirin and NSAIDs   He was originally maintained on Qvar and albuterol with poor control of symptoms. Started on Symbicort and Singulair in Oct 2019 with significant improvement in symptoms  Continues follow-up with Dr. Wilburn Cornelia from ENT for chronic rhinosinusitis.  Pets: Used to have a dog, no cats, birds, farm animals Occupation: Works as a Administrator for [employer], Theme park manager Exposures: No known exposures, mold mold, hot tub, Jacuzzi Smoking history: 10-pack-year smoker.  Quit in 1990 Travel history: Drives to [place] as a Administrator.  No other significant travel. Relevant family history: No significant family history of lung disease.  Interim history: He developed COVID-19 in early September 2023.  Did not require hospitalization but since then has multiple episodes of respiratory failure with pulm infiltrates.  He failed multiple rounds of antibiotics, prednisone as an outpatient and was hospitalized at the end of November 2023.  Underwent bronchoscopy on 11/27 for cultures and BAL to rule out eosinophilic pneumonia.  He is here for follow-up.  States that he is improved well since discharge and is feeling back to baseline.  Symbicort changed to [facility] in November 2023 due to insurance requirements.  Outpatient Encounter Medications as of 01/07/2022  Medication Sig   albuterol (PROVENTIL) (2.5 MG/3ML) 0.083% nebulizer  solution Take 3 mLs (2.5 mg total) by nebulization every 4 (four) hours as needed for wheezing or shortness of breath.   albuterol (VENTOLIN HFA) 108 (90 Base) MCG/ACT inhaler INHALE 2 PUFFS EVERY 6 HOURS AS NEEDED FOR WHEEZING OR SHORTNESS OF BREATH.   fluticasone (FLONASE) 50 MCG/ACT nasal spray SPRAY 2 SPRAYS INTO EACH NOSTRIL EVERY DAY   lansoprazole (PREVACID) 15 MG capsule Take 1 capsule (15 mg total) by mouth daily.   montelukast (SINGULAIR) 10 MG tablet TAKE 1 TABLET BY MOUTH EVERY DAY   SYMBICORT 160-4.5 MCG/ACT inhaler Inhale 2 puffs into the lungs in the morning and at bedtime.   triamcinolone cream (KENALOG) 0.1 % Apply 1 Application topically 2 (two) times daily as needed (rash).   Vitamin D, Ergocalciferol, (DRISDOL) 1.25 MG (50000 UNIT) CAPS capsule Take 1 capsule (50,000 Units total) by mouth every 14 (fourteen) days.   No facility-administered encounter medications on file as of 01/07/2022.   Physical Exam: Blood pressure 132/64, pulse 87, temperature 98.2 F (36.8 C), temperature source Oral, height _0  (1.803 m), weight 238 lb (108 kg), SpO2 99 %. Gen:      No acute distress HEENT:  EOMI, sclera anicteric Neck:     No masses; no thyromegaly Lungs:    Clear to auscultation bilaterally; normal respiratory effort CV:         Regular rate and rhythm; no murmurs Abd:      + bowel sounds; soft, non-tender; no palpable masses, no distension Ext:    No edema; adequate peripheral perfusion Skin:      Warm and dry; no rash Neuro:  alert and oriented x 3 Psych: normal mood and affect   Data Reviewed: Imaging: Chest x-ray 08/12/2017- chronic bronchitic changes, no acute cardiopulmonary abnormalities.   CT 12/24/2021-no PE, multilobar pneumonia, 3 mm right upper lobe subpleural nodule. I have reviewed the images personally.  PFTs: 01/04/2018-FVC 5.11 [122%), FEV1 4.38 [133%), F/F 86, TLC 66%, TLC 114% Mild restriction  FENO 11/27/17- 217 ACQ6 11/27/17 - 3 ACT score  01/03/2019-21  Labs: CBC 10/23/2017-WBC 8.2, eos 6%, absolute eosinophil count 492 IgE 11/19/2017-229  Bronchoscopy 12/27/2021 Cultures negative to date, cytology negative Cell count shows 1% eosinophils.  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  PFTs show mild restriction but there is no evidence of interstitial lung disease on chest x-ray.  Prior prolonged issues with post COVID-pneumonia and is finally improving back to normal.  Bronchoscopy results reviewed with no evidence of eosinophilia and cultures and cytology are negative. Continue Breo, Singulair Continue Flonase.  Use over-the-counter antihistamines  We discussed biologic therapy as he is a good candidate but since he has been stable prior to recent exacerbation after COVID he would like to avoid additional therapies for now.  Lung nodule Follow-up CT in 6 months.  Plan/Recommendations: - Continue Breo, Singulair - Follow-up CT in 6 months. Asencion Islam, OTC antihistamine  Marshell Garfinkel MD [facility] 01/07/2022, 9:35 AM  CC: Laurey Morale, MD

## 2022-02-07 LAB — FUNGUS CULTURE WITH STAIN

## 2022-02-07 LAB — FUNGUS CULTURE RESULT

## 2022-02-07 LAB — FUNGAL ORGANISM REFLEX

## 2022-02-09 LAB — ACID FAST CULTURE WITH REFLEXED SENSITIVITIES (MYCOBACTERIA): Acid Fast Culture: NEGATIVE

## 2022-02-15 ENCOUNTER — Other Ambulatory Visit (INDEPENDENT_AMBULATORY_CARE_PROVIDER_SITE_OTHER): Payer: Self-pay | Admitting: Bariatrics

## 2022-02-15 DIAGNOSIS — E559 Vitamin D deficiency, unspecified: Secondary | ICD-10-CM

## 2022-03-17 ENCOUNTER — Telehealth: Payer: Self-pay | Admitting: Pulmonary Disease

## 2022-03-21 ENCOUNTER — Other Ambulatory Visit (HOSPITAL_COMMUNITY): Payer: Self-pay

## 2022-03-21 MED ORDER — SYMBICORT 160-4.5 MCG/ACT IN AERO: 2.0000 | INHALATION_SPRAY | Freq: Two times a day (BID) | RESPIRATORY_TRACT | 5 refills | Status: DC

## 2022-03-21 NOTE — Telephone Encounter (Signed)
Rx for Symbicort sent to pharmacy for pt. Called and spoke with pt letting him know this had been done and made him aware that we were sending to prior auth team so they could do the PA for Symbicort and he verbalized understanding.  Routing to prior auth team for them to do the PA for Symbicort.

## 2022-03-21 NOTE — Telephone Encounter (Signed)
Pt. Calling back said he can't take Bero it put him in [facility] when he tried it so need alternative or if we can get the insurance to cover  Symbicort but is going to be out of meds in 2 days

## 2022-03-21 NOTE — Telephone Encounter (Signed)
Okay with sending a prescription for Symbicort and having pharmacy team do a PA

## 2022-03-21 NOTE — Telephone Encounter (Signed)
Per last office visit patient was changed to Thomas [name] Nov 23' due to insurance requirements. Thomas [name] is still a preferred alternative for his plan at this time.

## 2022-03-21 NOTE — Telephone Encounter (Signed)
Called and spoke with patient. He stated that he was switched to [facility] in November 2023 but this caused him to develop double PNA. He discontinued the [facility] and saw Dr. Vaughan Browner on 01/07/22. Per patient, he was told a message would be sent to the pharmacy team to see how we could keep him on Symbicort. I don't see any record of this and the last AVS states for him to continue Breo. I apologized to the patient.   He only has 2 days left of Symbicort 159mg. I advised him that I would send a message to Dr. MVaughan Brownerto see if he is ok with writing a RX for the Symbicort and for the PA Team to do a PA for the Symbicort. He verbalized understanding.   Dr. MVaughan Browner please advise on the Symbicort. Thanks

## 2022-03-28 ENCOUNTER — Telehealth: Payer: Self-pay

## 2022-03-28 ENCOUNTER — Other Ambulatory Visit (HOSPITAL_COMMUNITY): Payer: Self-pay

## 2022-03-28 ENCOUNTER — Telehealth: Payer: Self-pay | Admitting: Pulmonary Disease

## 2022-03-28 NOTE — Telephone Encounter (Addendum)
PA request received via CMM for Symbicort 160-4.5MCG/ACT aerosol  PA has been submitted to [place] and is pending determination.  Per notes Breo caused Hosp stay and double pneumonia.   Key: [id number]

## 2022-03-28 NOTE — Telephone Encounter (Signed)
PT calling about RX/inhaler. He wants to stay on original script, not change for something less expensive. Last time that happened it put him in hospital.   He spoke to [facility] and all the need, he said, is a letter from the Dr. Edison Simon why he needs to stay on his original (used it for 5 years) RX and they will make coverage reconsideration for him to have it covered.  He called Ins co @ [phone].  Wants to stay on Symbicort or move to [facility].   Please call PT to advise and unscramble his concerns. [phone]

## 2022-03-28 NOTE — Telephone Encounter (Signed)
Dr Vaughan Browner,  Patient is wanting to stay on Rx Inhaler. He wants to either stay on Symbicort or move to [facility].  Which do you prefer sir  Please advise

## 2022-03-28 NOTE — Telephone Encounter (Signed)
PA for Symbicort 160-4.5MCG/ACT aerosol has been submitted and will be updated in additional encounter created.

## 2022-03-29 ENCOUNTER — Telehealth: Payer: Self-pay | Admitting: Pulmonary Disease

## 2022-03-29 ENCOUNTER — Other Ambulatory Visit: Payer: Self-pay | Admitting: Family Medicine

## 2022-03-29 ENCOUNTER — Other Ambulatory Visit: Payer: Self-pay | Admitting: Pulmonary Disease

## 2022-03-29 ENCOUNTER — Other Ambulatory Visit (INDEPENDENT_AMBULATORY_CARE_PROVIDER_SITE_OTHER): Payer: Self-pay | Admitting: Bariatrics

## 2022-03-29 DIAGNOSIS — J4540 Moderate persistent asthma, uncomplicated: Secondary | ICD-10-CM

## 2022-03-29 DIAGNOSIS — E559 Vitamin D deficiency, unspecified: Secondary | ICD-10-CM

## 2022-03-29 NOTE — Telephone Encounter (Signed)
Patient checking on message for inhaler. Patient phone number is [phone].

## 2022-03-30 ENCOUNTER — Other Ambulatory Visit (HOSPITAL_COMMUNITY): Payer: Self-pay

## 2022-03-30 NOTE — Telephone Encounter (Signed)
Dr. Vaughan Browner can you please advise Please see previous encounters

## 2022-03-30 NOTE — Telephone Encounter (Signed)
Please see my response on 2/19 and earlier today  He can stay on the Symbicort at current dose of 160/4.5.  Can also give him a letter stating that he has been on this medication for 5 years with good control of symptoms and Breo caused him to have side effects and pneumonia.  Marshell Garfinkel MD [facility] 03/30/2022, 8:57 PM

## 2022-03-30 NOTE — Telephone Encounter (Signed)
He can stay on the Symbicort at current dose of 160/4.5.

## 2022-03-30 NOTE — Telephone Encounter (Signed)
PA could not be submitted via electronic request, has been re-submitted via fax to [place]. Pending determination.  Key: [id number]

## 2022-03-30 NOTE — Telephone Encounter (Signed)
Any update on PA for Symbicort. Patient is almost out

## 2022-03-31 ENCOUNTER — Telehealth: Payer: Self-pay | Admitting: Pulmonary Disease

## 2022-03-31 MED ORDER — BUDESONIDE-FORMOTEROL FUMARATE 160-4.5 MCG/ACT IN AERO: 2.0000 | INHALATION_SPRAY | Freq: Two times a day (BID) | RESPIRATORY_TRACT | 6 refills | Status: DC

## 2022-03-31 NOTE — Telephone Encounter (Signed)
PA has been DENIED. Denial letter has been attached in patients documents.

## 2022-03-31 NOTE — Telephone Encounter (Signed)
Patient would like generic Symbicort called into pharmacy. Patient out of medication. Pharmacy is [facility]. Patent phone number is [phone].

## 2022-03-31 NOTE — Telephone Encounter (Signed)
Called and spoke with patient. He verbalized understanding of the denial. He wishes to have the generic [facility]icort [facility] into [facility] [address]. RX has been sent.   Nothing further needed at time of call.

## 2022-03-31 NOTE — Telephone Encounter (Signed)
Patient states insurance will cover Salmeterol and Ellipta. Pharmacy is [facility]. Or [facility]. Patient phone number is [phone].

## 2022-03-31 NOTE — Addendum Note (Signed)
Addended by: Valerie Salts on: 03/31/2022 01:41 PM   Modules accepted: Orders

## 2022-04-01 ENCOUNTER — Other Ambulatory Visit: Payer: Self-pay | Admitting: Pulmonary Disease

## 2022-04-01 ENCOUNTER — Other Ambulatory Visit (HOSPITAL_COMMUNITY): Payer: Self-pay

## 2022-04-01 MED ORDER — BREZTRI AEROSPHERE 160-9-4.8 MCG/ACT IN AERO: 2.0000 | INHALATION_SPRAY | Freq: Two times a day (BID) | RESPIRATORY_TRACT | 0 refills | Status: DC

## 2022-04-01 NOTE — Telephone Encounter (Signed)
Advair HFA is not covered. Preferred alternatives are generic Advair Diskus and Breo.

## 2022-04-01 NOTE — Telephone Encounter (Signed)
He can not take Dry Powder inhaler,   Can we see if they Cover Advair HFA

## 2022-04-01 NOTE — Telephone Encounter (Signed)
Spoke to patient unfortunately patient cannot take any dry powder inhalers.  He was hospitalized after using Breo before.  Can we please send another appeal to his insurance and even do a peer to peer if needed so that his Symbicort can get approved May need to send this back to the pharmacy team to see if they can help with this.  Offered to change patient to [facility] with a sample from the office until we can figure this out patient does not want to try this alternative as he had a severe reaction to [facility] -afraid to try anything new.  Advised to use his albuterol inhaler as needed.

## 2022-04-01 NOTE — Telephone Encounter (Signed)
Spoke with pt and insurance agent who want to start appeal for Symbicort. Pharmacy can you please assist [name] with that?    Pt was again offered Breztri (see Tammy's note) Pt states he would pick up sample to cover him for the time being. Samples of Breztri left behind check in desk for pt pick up.

## 2022-04-01 NOTE — Telephone Encounter (Signed)
I received a pharmacy [facility]ill [facility] [place] 250/50 this morning as alternative inhaler per insurance.   Please advise

## 2022-04-01 NOTE — Telephone Encounter (Signed)
Called and spoke with patient. He stated that he called his insurance and was advised they cover Advair and Breo only. He has tried Breo before in the past and it caused him to be admitted into the hospital. He is willing to try the Advair as long as he can get the [facility] version. He wanted to try [facility] but is aware this is also not covered by his insurance.   [facility] to see if the generic Symbicort RX that was sent in yesterday was covered. Per the pharmacist, it is also not covered. Pharmacist confirmed that they will only Advair or Breo.   Patient is currently out of his Symbicort.   TP, since Dr. Vaughan Browner is off today, would you be willing to advise on the Advair? Thanks!

## 2022-04-01 NOTE — Telephone Encounter (Signed)
Pharmacy team, can we see if Advair HFA is covered?

## 2022-04-01 NOTE — Telephone Encounter (Signed)
TP, unfortunately Advair HFA is not covered. Only Advair and Breo are covered. Can you please advise? Thanks.

## 2022-04-04 ENCOUNTER — Ambulatory Visit: Payer: BC Managed Care – PPO | Admitting: Family Medicine

## 2022-04-04 NOTE — Telephone Encounter (Signed)
At this time due to staffing issues the providers office would be the ones to take over the appeal process.

## 2022-04-05 ENCOUNTER — Ambulatory Visit: Payer: BC Managed Care – PPO | Admitting: Family Medicine

## 2022-04-05 ENCOUNTER — Encounter: Payer: Self-pay | Admitting: Family Medicine

## 2022-04-05 VITALS — BP 128/78 | HR 78 | Temp 98.1°F | Wt 246.0 lb

## 2022-04-05 DIAGNOSIS — J4540 Moderate persistent asthma, uncomplicated: Secondary | ICD-10-CM

## 2022-04-05 DIAGNOSIS — J3089 Other allergic rhinitis: Secondary | ICD-10-CM | POA: Insufficient documentation

## 2022-04-05 NOTE — Progress Notes (Signed)
   Subjective:    Patient ID: Thomas [name], male    DOB: March 06, 1960, 62 y.o.   MRN: [id number]  HPI Here to check on some symptoms that began a week ago. These include stuffy head, PND, and a hoarse voice. No fever or cough. Also he has been seeing Dr. Vaughan Browner in Pulmonology, but he requests to be switched to Dr. Rodman Pickle. He had worked with her shortly after his last hospitalization. He had been doing well on Symbicort, but his insurance denies coverage for this and wanted him to use Breo instead. He says [facility] does not work, and they initiated a PA for [employer]. Apparently they had a peer to peer call with the insurance company, and they suggested he file an appeal. He is waiting for this to go through. In the meantime they gave him samples of Breztri to try.    Review of Systems  Constitutional: Negative.   HENT:  Positive for congestion, postnasal drip, sinus pressure and voice change. Negative for ear pain, sneezing and sore throat.   Eyes: Negative.   Respiratory: Negative.         Objective:   Physical Exam Constitutional:      Appearance: Normal appearance.     Comments: His voice is hoarse   HENT:     Right Ear: Tympanic membrane, ear canal and external ear normal.     Left Ear: Tympanic membrane, ear canal and external ear normal.     Nose: Nose normal.     Mouth/Throat:     Pharynx: Oropharynx is clear.  Eyes:     Conjunctiva/sclera: Conjunctivae normal.  Pulmonary:     Effort: Pulmonary effort is normal.     Breath sounds: Normal breath sounds.  Lymphadenopathy:     Cervical: No cervical adenopathy.  Neurological:     Mental Status: He is alert.           Assessment & Plan:  His recent symptoms seem to be allergic in nature. I advised him to take a daily antihistamine (he prefers Zyrtec) and to try Azelastine nasal sprays (he says Flonase never helped him). He will let us know if anything else changes. Per his request we will put in a referral to see  Dr. Loanne Drilling. Alysia Penna, MD

## 2022-04-06 ENCOUNTER — Ambulatory Visit: Payer: BC Managed Care – PPO | Admitting: Bariatrics

## 2022-04-06 ENCOUNTER — Encounter: Payer: Self-pay | Admitting: Bariatrics

## 2022-04-06 VITALS — BP 162/79 | HR 83 | Temp 98.4°F | Ht 70.0 in | Wt 242.0 lb

## 2022-04-06 DIAGNOSIS — E559 Vitamin D deficiency, unspecified: Secondary | ICD-10-CM

## 2022-04-06 DIAGNOSIS — I10 Essential (primary) hypertension: Secondary | ICD-10-CM | POA: Diagnosis not present

## 2022-04-06 DIAGNOSIS — E1169 Type 2 diabetes mellitus with other specified complication: Secondary | ICD-10-CM

## 2022-04-06 DIAGNOSIS — E669 Obesity, unspecified: Secondary | ICD-10-CM | POA: Diagnosis not present

## 2022-04-06 DIAGNOSIS — Z6834 Body mass index (BMI) 34.0-34.9, adult: Secondary | ICD-10-CM

## 2022-04-06 MED ORDER — VITAMIN D (ERGOCALCIFEROL) 1.25 MG (50000 UNIT) PO CAPS: 50000.0000 [IU] | ORAL_CAPSULE | ORAL | 0 refills | Status: DC

## 2022-04-06 NOTE — Telephone Encounter (Signed)
Appeal letter has been typed. Will have Dr. Vaughan Browner to sign and send to patient's insurance. Will leave a copy of the signed letter in Dr. Matilde Bash cabinet in [place].

## 2022-04-13 ENCOUNTER — Telehealth: Payer: Self-pay | Admitting: Pulmonary Disease

## 2022-04-13 NOTE — Telephone Encounter (Signed)
Received referral from Dr Sarajane Jews. Pt is requesting to switch providers to Dr Loanne Drilling. Please advise if this switch is okay.

## 2022-04-13 NOTE — Telephone Encounter (Signed)
Asthma referral sent requesting provider switch from Dr. Vaughan Browner to Dr. Loanne Drilling at [facility] location.   Message sent to Dr. Vaughan Browner and Dr. Loanne Drilling to advise

## 2022-04-14 MED ORDER — BREZTRI AEROSPHERE 160-9-4.8 MCG/ACT IN AERO: 2.0000 | INHALATION_SPRAY | Freq: Two times a day (BID) | RESPIRATORY_TRACT | 0 refills | Status: DC

## 2022-04-14 NOTE — Telephone Encounter (Signed)
Spoke with patient in lobby. I provided him with 2 more samples to last until we hear back about the appeal.

## 2022-04-14 NOTE — Telephone Encounter (Signed)
PT. CALLING BACK TO FIND OUT ABOUT THE STATUS OF  Appeal letter   HE IS ALMOST OUT OF SAMPLES

## 2022-04-14 NOTE — Telephone Encounter (Signed)
Ok with me 

## 2022-04-18 NOTE — Telephone Encounter (Signed)
Ok with me 

## 2022-04-19 NOTE — Progress Notes (Signed)
Chief Complaint:   OBESITY Thomas [name] is here to discuss his progress with his obesity treatment plan along with follow-up of his obesity related diagnoses. [name] is on the Category 3 plan and states he is following his eating plan approximately 0% of the time. [name] states he is walking 2-3 miles 2-3 times per week.  Today's visit was #: 8 Starting weight: 249 lbs Starting date: 04/14/21 Today's weight: 242 lbs Today's date: 04/06/22 Total lbs lost to date: 7 Total lbs lost since last in-office visit: +19  Interim History: It has been 6 months since I have seen the patient.  He states that he is ready to get the pounds off.  Subjective:   1. Essential hypertension BP 128/78 yesterday.  Today-162/79. No medication.  2. Vitamin D deficiency Vitamin D 55.5 on 09/21/2021.  3. Diabetes mellitus type 2 in obese (HCC) No medications.  Fasting blood sugar 91-1 22.  Assessment/Plan:   1. Essential hypertension 1.  No added salt. 2.  Limit sodium to 1500-2000 mg daily.  2. Vitamin D deficiency Refill: - Vitamin D, Ergocalciferol, (DRISDOL) 1.25 MG (50000 UNIT) CAPS capsule; Take 1 capsule (50,000 Units total) by mouth every 14 (fourteen) days.  Dispense: 4 capsule; Refill: 0  3. Diabetes mellitus type 2 in obese ([place]) 1.  Decrease carbohydrates (sweets and carbs).  4. Generalized obesity BMI 34.0-34.9,adult 1.  Meal planning. 2.  Reviewed labs 09/21/2021: C MP, CBC, glucose, lipids, vitamin D, A1c, insulin, thyroid panel. 3.  Keep carbohydrates low.  Thomas [name] is currently in the action stage of change. As such, his goal is to continue with weight loss efforts. He has agreed to the Category 2 plan.  Exercise goals: Walking and gardening.  Behavioral modification strategies: increasing lean protein intake, decreasing simple carbohydrates, increasing vegetables, increasing water intake, decreasing eating out, no skipping meals, meal planning and cooking strategies, keeping  healthy foods in the home, and planning for success.  [name] has agreed to follow-up with our clinic in 2 weeks with fasting incentive calorimetry. He was informed of the importance of frequent follow-up visits to maximize his success with intensive lifestyle modifications for his multiple health conditions.  Objective:   Blood pressure (!) 162/79, pulse 83, temperature 98.4 F (36.9 C), height 5\' 10"  (1.778 m), weight 242 lb (109.8 kg), SpO2 97 %. Body mass index is 34.72 kg/m.  General: Cooperative, alert, well developed, in no acute distress. HEENT: Conjunctivae and lids unremarkable. Cardiovascular: Regular rhythm.  Lungs: Normal work of breathing. Neurologic: No focal deficits.   Lab Results  Component Value Date   CREATININE 0.93 12/26/2021   BUN 11 12/26/2021   NA 138 12/26/2021   K 3.8 12/26/2021   CL 105 12/26/2021   CO2 26 12/26/2021   Lab Results  Component Value Date   ALT 19 12/26/2021   AST 15 12/26/2021   ALKPHOS 53 12/26/2021   BILITOT 0.6 12/26/2021   Lab Results  Component Value Date   HGBA1C 6.3 (H) 12/24/2021   HGBA1C 5.5 09/21/2021   HGBA1C 6.5 02/24/2021   HGBA1C 6.0 12/25/2018   HGBA1C 5.7 (H) 02/12/2018   Lab Results  Component Value Date   INSULIN 12.0 09/21/2021   INSULIN 18.0 04/14/2021   INSULIN 10.9 02/12/2018   INSULIN 14.1 10/23/2017   Lab Results  Component Value Date   TSH 1.150 09/21/2021   Lab Results  Component Value Date   CHOL 166 09/21/2021   HDL 39 (L) 09/21/2021   [place]  113 (H) 09/21/2021   TRIG 73 09/21/2021   CHOLHDL 5 02/24/2021   Lab Results  Component Value Date   VD25OH 55.5 09/21/2021   VD25OH 43.6 04/14/2021   VD25OH 56.1 02/12/2018   Lab Results  Component Value Date   WBC 10.2 12/26/2021   HGB 11.9 (L) 12/26/2021   HCT 34.4 (L) 12/26/2021   MCV 92.2 12/26/2021   PLT 235 12/26/2021   No results found for: "IRON", "TIBC", "FERRITIN"  Attestation Statements:   Reviewed by clinician on day  of visit: allergies, medications, problem list, medical history, surgical history, family history, social history, and previous encounter notes.  I, Dawn Whitmire, FNP-C, am acting as transcriptionist for Dr. Jearld Lesch.  I have reviewed the above documentation for accuracy and completeness, and I agree with the above. Jearld Lesch, DO

## 2022-04-27 ENCOUNTER — Encounter: Payer: Self-pay | Admitting: Bariatrics

## 2022-04-27 ENCOUNTER — Other Ambulatory Visit: Payer: Self-pay | Admitting: Bariatrics

## 2022-04-27 DIAGNOSIS — E559 Vitamin D deficiency, unspecified: Secondary | ICD-10-CM

## 2022-04-28 ENCOUNTER — Telehealth: Payer: Self-pay | Admitting: Pulmonary Disease

## 2022-04-28 ENCOUNTER — Encounter: Payer: Self-pay | Admitting: Bariatrics

## 2022-04-28 ENCOUNTER — Ambulatory Visit: Payer: BC Managed Care – PPO | Admitting: Bariatrics

## 2022-04-28 VITALS — BP 144/78 | HR 89 | Temp 98.1°F | Ht 70.0 in | Wt 239.0 lb

## 2022-04-28 DIAGNOSIS — E669 Obesity, unspecified: Secondary | ICD-10-CM | POA: Diagnosis not present

## 2022-04-28 DIAGNOSIS — R5383 Other fatigue: Secondary | ICD-10-CM

## 2022-04-28 DIAGNOSIS — E559 Vitamin D deficiency, unspecified: Secondary | ICD-10-CM | POA: Diagnosis not present

## 2022-04-28 DIAGNOSIS — R0602 Shortness of breath: Secondary | ICD-10-CM

## 2022-04-28 DIAGNOSIS — Z6834 Body mass index (BMI) 34.0-34.9, adult: Secondary | ICD-10-CM | POA: Diagnosis not present

## 2022-04-28 MED ORDER — VITAMIN D (ERGOCALCIFEROL) 1.25 MG (50000 UNIT) PO CAPS: ORAL_CAPSULE | ORAL | 0 refills | Status: DC

## 2022-04-28 NOTE — Telephone Encounter (Signed)
Pt came in for Breztri sample until insurance approves him

## 2022-04-28 NOTE — Telephone Encounter (Signed)
Got Breztri samples from the samples cabinet. Went upfront and gave patient two boxes of Breztri. He is waiting for the appeal letter for Symbicort to be signed by Dr Concepcion Living. Nothing further needed

## 2022-04-29 NOTE — Telephone Encounter (Signed)
Fax received this morning from [facility] health plan stating patient insurance approved continued coverage of non-formulary Symbicort.  Patient is eligible to receive Symbicort until 10/02/2023. ATC patient with appeal update.  Left message on VM to let him know insurance will continue covering Symbicort until 10/02/2023.  Call back number given for any questions or problems.  Nothing further at this time.

## 2022-04-29 NOTE — Telephone Encounter (Signed)
See encounter from 3/28. Patient is now approved for Symbicort 158mcg.   Will close encounter.

## 2022-05-03 NOTE — Progress Notes (Signed)
Chief Complaint:   OBESITY Thomas [name] is here to discuss his progress with his obesity treatment plan along with follow-up of his obesity related diagnoses. Thomas [name] is on the Category 2 Plan and states he is following his eating plan approximately 85% of the time. Thomas [name] states he is walking 3 miles 55-60 minutes 3-4 times per week.  Today's visit was #: 9 Starting weight: 249 lbs Starting date: 04/14/2021 Today's weight: 239 lbs Today's date: 04/28/2022 Total lbs lost to date: 10 lbs Total lbs lost since last in-office visit: 3 lbs  Interim History: Patient is down 3 LBS.  He is doing well with his protein.  Subjective:   1. Other fatigue RMR today 2045-calorie. RMR expected 2258 kcal. Previous RMR 1803-calorie in 2019  2. SOBOE (shortness of breath on exertion) Casimiro Needle notes increasing shortness of breath with exercising and seems to be worsening over time with weight gain. He notes getting out of breath sooner with activity than he used to. This has not gotten worse recently. Chip denies shortness of breath at rest or orthopnea.  3. Vitamin D deficiency Patient is taking as directed.  Assessment/Plan:   1. Other fatigue Discussed the implication for diet and exercise to RMR.   2. SOBOE (shortness of breath on exertion) Brison does feel that he gets out of breath more easily that he used to when he exercises. Euclid's shortness of breath appears to be obesity related and exercise induced. He has agreed to work on weight loss and gradually increase exercise to treat his exercise induced shortness of breath. Will continue to monitor closely.  3. Vitamin D deficiency Refill- Vitamin D, Ergocalciferol, (DRISDOL) 1.25 MG (50000 UNIT) CAPS capsule; TAKE 1 CAPSULE (50,000 UNITS TOTAL) BY MOUTH EVERY 14 DAYS  Dispense: 6 capsule; Refill: 0  4. Generalized obesity  5. Obesity,current BMI 34.3 Thomas [name] is currently in the action stage of change. As such, his goal is to  continue with weight loss efforts. He has agreed to the Category 3 Plan and keeping a food journal and adhering to recommended goals of 1500 calories and 100 protein.   1.  Patient was change from category 2 to the category 3. 2.  Increase fiber. 3.  Increase fruits and veggies. 4.  Discussed intermittent fasting.  Exercise goals:  Continue walking.  Behavioral modification strategies: increasing lean protein intake, decreasing simple carbohydrates, increasing vegetables, increasing water intake, decreasing eating out, no skipping meals, meal planning and cooking strategies, keeping healthy foods in the home, and planning for success.  Kairyn has agreed to follow-up with our clinic in 4 weeks. He was informed of the importance of frequent follow-up visits to maximize his success with intensive lifestyle modifications for his multiple health conditions.   Objective:   Blood pressure (!) 144/78, pulse 89, temperature 98.1 F (36.7 C), height 5\' 10"  (1.778 m), weight 239 lb (108.4 kg), SpO2 96 %. Body mass index is 34.29 kg/m.  General: Cooperative, alert, well developed, in no acute distress. HEENT: Conjunctivae and lids unremarkable. Cardiovascular: Regular rhythm.  Lungs: Normal work of breathing. Neurologic: No focal deficits.   Lab Results  Component Value Date   CREATININE 0.93 12/26/2021   BUN 11 12/26/2021   NA 138 12/26/2021   K 3.8 12/26/2021   CL 105 12/26/2021   CO2 26 12/26/2021   Lab Results  Component Value Date   ALT 19 12/26/2021   AST 15 12/26/2021   ALKPHOS 53 12/26/2021   BILITOT 0.6 12/26/2021  Lab Results  Component Value Date   HGBA1C 6.3 (H) 12/24/2021   HGBA1C 5.5 09/21/2021   HGBA1C 6.5 02/24/2021   HGBA1C 6.0 12/25/2018   HGBA1C 5.7 (H) 02/12/2018   Lab Results  Component Value Date   INSULIN 12.0 09/21/2021   INSULIN 18.0 04/14/2021   INSULIN 10.9 02/12/2018   INSULIN 14.1 10/23/2017   Lab Results  Component Value Date   TSH 1.150  09/21/2021   Lab Results  Component Value Date   CHOL 166 09/21/2021   HDL 39 (L) 09/21/2021   LDLCALC 113 (H) 09/21/2021   TRIG 73 09/21/2021   CHOLHDL 5 02/24/2021   Lab Results  Component Value Date   VD25OH 55.5 09/21/2021   VD25OH 43.6 04/14/2021   VD25OH 56.1 02/12/2018   Lab Results  Component Value Date   WBC 10.2 12/26/2021   HGB 11.9 (L) 12/26/2021   HCT 34.4 (L) 12/26/2021   MCV 92.2 12/26/2021   PLT 235 12/26/2021   No results found for: "IRON", "TIBC", "FERRITIN"  Attestation Statements:   Reviewed by clinician on day of visit: allergies, medications, problem list, medical history, surgical history, family history, social history, and previous encounter notes.  Silvana Newness, am acting as Energy manager for [employer], DO.  I have reviewed the above documentation for accuracy and completeness, and I agree with the above. Corinna Capra, DO

## 2022-05-04 ENCOUNTER — Ambulatory Visit (HOSPITAL_BASED_OUTPATIENT_CLINIC_OR_DEPARTMENT_OTHER): Payer: BC Managed Care – PPO | Admitting: Pulmonary Disease

## 2022-05-04 ENCOUNTER — Other Ambulatory Visit (HOSPITAL_COMMUNITY): Payer: Self-pay

## 2022-05-04 ENCOUNTER — Encounter (HOSPITAL_BASED_OUTPATIENT_CLINIC_OR_DEPARTMENT_OTHER): Payer: Self-pay | Admitting: Pulmonary Disease

## 2022-05-04 VITALS — BP 130/70 | HR 95 | Ht 70.0 in | Wt 236.0 lb

## 2022-05-04 DIAGNOSIS — J4540 Moderate persistent asthma, uncomplicated: Secondary | ICD-10-CM | POA: Diagnosis not present

## 2022-05-04 MED ORDER — BUDESONIDE-FORMOTEROL FUMARATE 160-4.5 MCG/ACT IN AERO: 2.0000 | INHALATION_SPRAY | Freq: Two times a day (BID) | RESPIRATORY_TRACT | 3 refills | Status: DC

## 2022-05-04 NOTE — Progress Notes (Signed)
Subjective:   PATIENT ID: Thomas [name] GENDER: male DOB: 03/14/1960, MRN: [id number]  Chief Complaint  Patient presents with   New Patient (Initial Visit)    Needs new neb machine    Reason for Visit: New patient to me as outpatient, establishing care  Thomas [name] is a 62 year old male with asthma, nasal polyposis, chronic sinusitis, DM, GERD who presents for follow-up for recurrent pneumonias.  Synopsis: Has been well controlled on Symbicort for >5 years. After switching to [facility] he feels he has had increased respiratory symptoms including recent hospitalization in 12/2021. Reviewed chart in EMR including recent pulmonary note on 01/07/22. Developed Covid 19 in 10/2021. Since then has had multiple episodes of respiratory failure with pulmonary infiltrates. Has been offered biologics however since he had stable asthma symptoms prior to this, this was avoided.  Denies shortness of breath, cough or wheezing today. Pollen is aggravating but otherwise doing well on his Symbicort.   Prior inhalers: Breo - double pneumonia/hospitalization. Does not tolerate powders Breztri - ok until he can switch to Symbicort  Social History: Smoked in his 57's to age 45, approximately 1/2-1ppd, quid 1990.  Previously worked as Education officer, environmental. He drives to [facility] every weekend for work. 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  Quit in 1990  I have personally reviewed patient's past medical/family/social history, allergies, current medications.  Past Medical History:  Diagnosis Date   Allergy    Asthma    Back pain    COVID-18 Nov 2021   Diabetes    GERD (gastroesophageal reflux disease)    History of hiatal hernia    Nasal polyposis    Pneumonia    5 -7 yrs ago   Swallowing difficulty      Family History   Problem Relation Age of Onset   Cancer Mother    Cancer Father    Hyperlipidemia Father    Diabetes Other        maternal family     Social History   Occupational History   Occupation: Education officer, environmental full time  Tobacco Use   Smoking status: Former    Packs/day: 1.00    Years: 10.00    Additional pack years: 0.00    Total pack years: 10.00    Types: Cigarettes    Quit date: 01/31/1986    Years since quitting: 36.2   Smokeless tobacco: Never  Substance and Sexual Activity   Alcohol use: Yes    Comment: rarely   Drug use: No   Sexual activity: Not on file    Allergies  Allergen Reactions   No Known Allergies      Outpatient Medications Prior to Visit  Medication Sig Dispense Refill   albuterol (PROVENTIL) (2.5 MG/3ML) 0.083% nebulizer solution Take 3 mLs (2.5 mg total) by nebulization every 4 (four) hours as needed for wheezing or shortness of breath. 75 mL 12   albuterol (VENTOLIN HFA) 108 (90 Base) MCG/ACT inhaler INHALE 2 PUFFS EVERY 6 HOURS AS NEEDED FOR WHEEZING OR SHORTNESS OF BREATH. 8.5 each 1   fluticasone (FLONASE) 50 MCG/ACT nasal spray SPRAY 2 SPRAYS INTO EACH NOSTRIL EVERY DAY 48 mL 0   lansoprazole (PREVACID) 15 MG capsule Take 1 capsule (15 mg total) by mouth daily. 90 capsule 3   montelukast (  SINGULAIR) 10 MG tablet TAKE 1 TABLET BY MOUTH EVERY DAY 90 tablet 3   triamcinolone cream (KENALOG) 0.1 % Apply 1 Application topically 2 (two) times daily as needed (rash). 45 g 2   Vitamin D, Ergocalciferol, (DRISDOL) 1.25 MG (50000 UNIT) CAPS capsule TAKE 1 CAPSULE (50,000 UNITS TOTAL) BY MOUTH EVERY 14 DAYS 6 capsule 0   Budeson-Glycopyrrol-Formoterol (BREZTRI AEROSPHERE) 160-9-4.8 MCG/ACT AERO Inhale 2 puffs into the lungs in the morning and at bedtime. 5.9 g 0   Budeson-Glycopyrrol-Formoterol (BREZTRI AEROSPHERE) 160-9-4.8 MCG/ACT AERO Inhale 2 puffs into the lungs in the morning and at bedtime. 2 each 0   SYMBICORT 160-4.5 MCG/ACT inhaler Inhale 2 puffs into the lungs in  the morning and at bedtime. (Patient not taking: Reported on 05/04/2022) 10.2 g 5   No facility-administered medications prior to visit.    Review of Systems  Constitutional:  Negative for chills, diaphoresis, fever, malaise/fatigue and weight loss.  HENT:  Negative for congestion.   Respiratory:  Negative for cough, hemoptysis, sputum production, shortness of breath and wheezing.   Cardiovascular:  Negative for chest pain, palpitations and leg swelling.  Endo/Heme/Allergies:  Positive for environmental allergies.     Objective:   Vitals:   05/04/22 1518  BP: 130/70  Pulse: 95  SpO2: 98%  Weight: 236 lb (107 kg)  Height:  (1.778 m)   SpO2: 98 % O2 Device: None (Room air)  Physical Exam: General: Well-appearing, no acute distress HENT: [place], AT Eyes: EOMI, no scleral icterus Respiratory: Clear to auscultation bilaterally.  No crackles, wheezing or rales Cardiovascular: RRR, -M/R/G, no JVD Extremities:-Edema,-tenderness Neuro: AAO x4, CNII-XII grossly intact Psych: Normal mood, normal affect  Data Reviewed:  Imaging: CT A/P 07/04/11 - Visualized lung bases without scarring or atelectasis CT A/P 10/03/20 - Minimal left basilar scarring/atelectasis CTA 12/24/21 - RUL 3 mm subpleural nodule. Bilateral patchy GGO CXR 12/28/22 Worsening RML/RLL opacities  PFT: 01/04/18 FVC 5.11 (122%) FEV1 4.38 (133%) Ratio 86  TLC 66% DLCO 114%. No significant BD response Interpretation: Mild restrictive defect. Normal DLC  Labs: CBC    Component Value Date/Time   WBC 10.2 12/26/2021 0350   RBC 3.73 (L) 12/26/2021 0350   HGB 11.9 (L) 12/26/2021 0350   HGB 16.0 10/23/2017 0953   HCT 34.4 (L) 12/26/2021 0350   HCT 42.6 10/23/2017 0953   PLT 235 12/26/2021 0350   MCV 92.2 12/26/2021 0350   MCV 88 10/23/2017 0953   MCH 31.9 12/26/2021 0350   MCHC 34.6 12/26/2021 0350   RDW 12.5 12/26/2021 0350   RDW 13.4 10/23/2017 0953   LYMPHSABS 0.7 12/24/2021 0000   LYMPHSABS 2.2 10/23/2017  0953   MONOABS 0.9 12/24/2021 0000   EOSABS 0.1 12/24/2021 0000   EOSABS 0.5 (H) 10/23/2017 0953   BASOSABS 0.1 12/24/2021 0000   BASOSABS 0.0 10/23/2017 0953   Absolute eos 10/23/17 - 500 12/24/21 - 100 - improved     Assessment & Plan:   Discussion: 62 year old male with asthma, nasal polyposis, chronic sinusitis, DM, GERD who presents for follow-up for recurrent pneumonias. Has had recurrent URI/pna since 10/2021. Previously well controlled asthma on Symbicort for >5 years. Will plan to repeat CT scan this summer however it is reassuring that his symptoms have improved after switching from [facility] to Symbicort. If infiltrates persistent on next scan will repeat PFTs to rule out progressive restrictive defect. This may be related to COVID +/- long haulers's. Discussed clinical course and management of asthma and covid  long hauler including bronchodilator regimen, preventive care including vaccinations and action plan for exacerbation.   RML/RLL consolidation S/p bronchoscopy with BAL 12/28/22. Predominantly monocytic and neutrophilic, 56% and 41% respectively --Cultures and cytology negative --F/u repeat CT in June 2024  Moderate persistent asthma Mild restrictive defect with normal DLCO - no evidence of fibrosis on imaging --ORDER Symbicort 160-4.5 mcg TWO puffs in the morning and evening. Rinse mouth out after use  [employer] health plan stating patient insurance approved continued coverage of non-formulary Symbicort until 10/02/2023.  --Change CT Chest results from Dr. Isaiah Serge to me --ORDER nebulizer - current one is damaged  Nasal congestion Previously on Astelin 0.15%. See if taste and smell improve off of this medication. Use flonase for now  Health Maintenance Immunization History  Administered Date(s) Administered   Fluad Quad(high Dose 65+) 01/03/2019, 12/04/2019, 12/29/2020   Influenza, High Dose Seasonal PF 03/14/2018   PFIZER Comirnaty(Gray Top)Covid-19 Tri-Sucrose Vaccine  05/25/2020   PFIZER(Purple Top)SARS-COV-2 Vaccination 03/12/2019, 04/06/2019, 01/14/2020   Tdap 08/21/2014   CT Lung Screen - not qualified. Insufficient tobacco history  Orders Placed This Encounter  Procedures   Ambulatory Referral for DME    Referral Priority:   Routine    Referral Type:   Durable Medical Equipment Purchase    Number of Visits Requested:   1   Meds ordered this encounter  Medications   DISCONTD: budesonide-formoterol (SYMBICORT) 160-4.5 MCG/ACT inhaler    Sig: Inhale 2 puffs into the lungs 2 (two) times daily.    Dispense:  3 each    Refill:  3    Return for after CT scan 07/11/22, with Dr. Everardo All.  I have spent a total time of 45-minutes on the day of the appointment reviewing prior documentation, coordinating care and discussing medical diagnosis and plan with the patient/family. Imaging, labs and tests included in this note have been reviewed and interpreted independently by me.  Johnika Escareno Mechele Collin, MD [facility] 05/04/2022 3:40 PM  Office Number [phone]

## 2022-05-04 NOTE — Patient Instructions (Addendum)
Moderate persistent asthma --ORDER Symbicort 160-4.5 mcg TWO puffs in the morning and evening. Rinse mouth out after use --Change CT Chest results from Dr. Vaughan Browner to me --ORDER nebulizer - current one is damaged  Nasal congestion See if taste and smell improve off of this medication. Use flonase for now

## 2022-05-05 ENCOUNTER — Other Ambulatory Visit: Payer: Self-pay | Admitting: Family Medicine

## 2022-05-05 DIAGNOSIS — B9689 Other specified bacterial agents as the cause of diseases classified elsewhere: Secondary | ICD-10-CM

## 2022-05-06 ENCOUNTER — Other Ambulatory Visit (INDEPENDENT_AMBULATORY_CARE_PROVIDER_SITE_OTHER): Payer: Self-pay | Admitting: Bariatrics

## 2022-05-06 DIAGNOSIS — E559 Vitamin D deficiency, unspecified: Secondary | ICD-10-CM

## 2022-05-09 ENCOUNTER — Other Ambulatory Visit: Payer: Self-pay | Admitting: *Deleted

## 2022-05-09 MED ORDER — BUDESONIDE-FORMOTEROL FUMARATE 160-4.5 MCG/ACT IN AERO: 2.0000 | INHALATION_SPRAY | Freq: Two times a day (BID) | RESPIRATORY_TRACT | 3 refills | Status: DC

## 2022-05-10 ENCOUNTER — Encounter (HOSPITAL_BASED_OUTPATIENT_CLINIC_OR_DEPARTMENT_OTHER): Payer: Self-pay | Admitting: Pulmonary Disease

## 2022-05-10 MED ORDER — BUDESONIDE-FORMOTEROL FUMARATE 160-4.5 MCG/ACT IN AERO: 2.0000 | INHALATION_SPRAY | Freq: Two times a day (BID) | RESPIRATORY_TRACT | 3 refills | Status: DC

## 2022-05-10 NOTE — Progress Notes (Signed)
[employer] health plan stating patient insurance approved continued coverage of non-formulary Symbicort. Patient is eligible to receive Symbicort until 10/02/2023.   Comment updated in prescription to provide name brand Symbicort only

## 2022-05-10 NOTE — Addendum Note (Signed)
Addended by: Luciano Cutter on: 05/10/2022 01:19 AM   Modules accepted: Orders

## 2022-05-18 ENCOUNTER — Encounter: Payer: Self-pay | Admitting: Bariatrics

## 2022-05-18 ENCOUNTER — Ambulatory Visit: Payer: BC Managed Care – PPO | Admitting: Bariatrics

## 2022-05-18 VITALS — BP 147/67 | HR 91 | Temp 98.0°F | Ht 70.0 in | Wt 246.0 lb

## 2022-05-18 DIAGNOSIS — I10 Essential (primary) hypertension: Secondary | ICD-10-CM | POA: Diagnosis not present

## 2022-05-18 DIAGNOSIS — E669 Obesity, unspecified: Secondary | ICD-10-CM

## 2022-05-18 DIAGNOSIS — E559 Vitamin D deficiency, unspecified: Secondary | ICD-10-CM

## 2022-05-18 DIAGNOSIS — Z6835 Body mass index (BMI) 35.0-35.9, adult: Secondary | ICD-10-CM | POA: Diagnosis not present

## 2022-05-18 MED ORDER — VITAMIN D (ERGOCALCIFEROL) 1.25 MG (50000 UNIT) PO CAPS: ORAL_CAPSULE | ORAL | 0 refills | Status: DC

## 2022-05-18 NOTE — Telephone Encounter (Signed)
d 

## 2022-05-24 NOTE — Progress Notes (Signed)
Chief Complaint:   OBESITY Thomas [name] is here to discuss his progress with his obesity treatment plan along with follow-up of his obesity related diagnoses. [name] is on the Category 3 Plan and states he is following his eating plan approximately 80% of the time. [name] states he is walking 3 miles 3-4 times per week.    Today's visit was #: 10 Starting weight: 249 lbs Starting date: 04/14/2021 Today's weight: 246 lbs Today's date: 05/18/2022 Total lbs lost to date: 3 Total lbs lost since last in-office visit: 0  Interim History: [name] is up 7 lbs since his last visit. According to our bioimpedance scale his water weight is up 3 lbs.   Subjective:   1. Vitamin D deficiency [name] is taking Vitamin D as directed.   2. Essential hypertension [name]'s blood pressure is reasonably well controlled.   Assessment/Plan:   1. Vitamin D deficiency We will refill prescription Vitamin D for 90 days.   - Vitamin D, Ergocalciferol, (DRISDOL) 1.25 MG (50000 UNIT) CAPS capsule; TAKE 1 CAPSULE (50,000 UNITS TOTAL) BY MOUTH EVERY 14 DAYS  Dispense: 6 capsule; Refill: 0  2. Essential hypertension Demarko will continue his medications.   3. Generalized obesity  4. BMI 35.0-35.9,adult [name] is currently in the action stage of change. As such, his goal is to continue with weight loss efforts. He has agreed to the Category 3 Plan.   The New Atkins for a New You/[place].   Exercise goals: As is.   Behavioral modification strategies: increasing lean protein intake, decreasing simple carbohydrates, increasing vegetables, increasing water intake, decreasing eating out, no skipping meals, meal planning and cooking strategies, keeping healthy foods in the home, and planning for success.  [name] has agreed to follow-up with our clinic in 3 to 4 weeks. He was informed of the importance of frequent follow-up visits to maximize his success with intensive lifestyle modifications for his  multiple health conditions.   Objective:   Blood pressure (!) 147/67, pulse 91, temperature 98 F (36.7 C), height  (1.778 m), weight 246 lb (111.6 kg), SpO2 97 %. Body mass index is 35.3 kg/m.  General: Cooperative, alert, well developed, in no acute distress. HEENT: Conjunctivae and lids unremarkable. Cardiovascular: Regular rhythm.  Lungs: Normal work of breathing. Neurologic: No focal deficits.   Lab Results  Component Value Date   CREATININE 0.93 12/26/2021   BUN 11 12/26/2021   NA 138 12/26/2021   K 3.8 12/26/2021   CL 105 12/26/2021   CO2 26 12/26/2021   Lab Results  Component Value Date   ALT 19 12/26/2021   AST 15 12/26/2021   ALKPHOS 53 12/26/2021   BILITOT 0.6 12/26/2021   Lab Results  Component Value Date   HGBA1C 6.3 (H) 12/24/2021   HGBA1C 5.5 09/21/2021   HGBA1C 6.5 02/24/2021   HGBA1C 6.0 12/25/2018   HGBA1C 5.7 (H) 02/12/2018   Lab Results  Component Value Date   INSULIN 12.0 09/21/2021   INSULIN 18.0 04/14/2021   INSULIN 10.9 02/12/2018   INSULIN 14.1 10/23/2017   Lab Results  Component Value Date   TSH 1.150 09/21/2021   Lab Results  Component Value Date   CHOL 166 09/21/2021   HDL 39 (L) 09/21/2021   LDLCALC 113 (H) 09/21/2021   TRIG 73 09/21/2021   CHOLHDL 5 02/24/2021   Lab Results  Component Value Date   VD25OH 55.5 09/21/2021   VD25OH 43.6 04/14/2021   VD25OH 56.1 02/12/2018   Lab Results  Component Value Date   WBC 10.2 12/26/2021   HGB 11.9 (L) 12/26/2021   HCT 34.4 (L) 12/26/2021   MCV 92.2 12/26/2021   PLT 235 12/26/2021   No results found for: "IRON", "TIBC", "FERRITIN"  Attestation Statements:   Reviewed by clinician on day of visit: allergies, medications, problem list, medical history, surgical history, family history, social history, and previous encounter notes.   Trude Mcburney, am acting as Energy manager for [employer], DO.  I have reviewed the above documentation for accuracy and  completeness, and I agree with the above. Corinna Capra, DO

## 2022-05-25 ENCOUNTER — Telehealth: Payer: Self-pay | Admitting: Family Medicine

## 2022-05-25 NOTE — Telephone Encounter (Signed)
Pt requesting new script for 3 month supply of ketoconazole (NIZORAL) 2 % cream   [id number] [place], [place] - [address] Phone: [phone]  Fax: [phone]

## 2022-05-26 NOTE — Telephone Encounter (Signed)
Rx not on pt medication list. Please advise

## 2022-05-27 MED ORDER — KETOCONAZOLE 2 % EX CREA: 1.0000 | TOPICAL_CREAM | Freq: Two times a day (BID) | CUTANEOUS | 3 refills | Status: AC

## 2022-05-27 NOTE — Telephone Encounter (Signed)
Done

## 2022-05-27 NOTE — Telephone Encounter (Signed)
Pt notified via My Chart

## 2022-05-27 NOTE — Addendum Note (Signed)
Addended by: Gershon Crane A on: 05/27/2022 09:27 AM   Modules accepted: Orders

## 2022-06-02 ENCOUNTER — Ambulatory Visit: Payer: BC Managed Care – PPO | Admitting: Bariatrics

## 2022-06-02 ENCOUNTER — Encounter: Payer: Self-pay | Admitting: Bariatrics

## 2022-06-02 VITALS — BP 146/71 | HR 71 | Temp 98.5°F | Ht 70.0 in | Wt 239.0 lb

## 2022-06-02 DIAGNOSIS — Z6834 Body mass index (BMI) 34.0-34.9, adult: Secondary | ICD-10-CM

## 2022-06-02 DIAGNOSIS — I10 Essential (primary) hypertension: Secondary | ICD-10-CM | POA: Diagnosis not present

## 2022-06-02 DIAGNOSIS — E669 Obesity, unspecified: Secondary | ICD-10-CM | POA: Diagnosis not present

## 2022-06-07 NOTE — Progress Notes (Unsigned)
Chief Complaint:   OBESITY Thomas [name] is here to discuss his progress with his obesity treatment plan along with follow-up of his obesity related diagnoses. Thomas [name] is on the Category 3 Plan and states he is following his eating plan approximately 95% of the time. Thomas [name] states he is walking 3 miles a day 3-4 times per week.    Today's visit was #: 11 Starting weight: 249 lbs Starting date: 04/14/2021 Today's weight: 239 lbs Today's date: 06/02/2022 Total lbs lost to date: 10 Total lbs lost since last in-office visit: 7  Interim History: Thomas [name] is down 7 pounds since his last visit.  He is exercising more.  He is on the new Atkins diet and no carbohydrates.  He is taking multivitamins.  Subjective:   1. Essential hypertension Thomas [name] is not on medications currently, and his blood pressure is controlled.  Assessment/Plan:   1. Essential hypertension Thomas [name] is to eliminate added salt, and continue his activity.  2. Generalized obesity  3. BMI 34.0-34.9,adult Thomas [name] is currently in the action stage of change. As such, his goal is to continue with weight loss efforts. He has agreed to the Category 3 Plan.   Meal planning and intentional eating were discussed.  Electrolyte or Powerade.   Exercise goals: As is.   Behavioral modification strategies: increasing lean protein intake, decreasing simple carbohydrates, increasing vegetables, increasing water intake, decreasing eating out, no skipping meals, meal planning and cooking strategies, keeping healthy foods in the home, and planning for success.  Thomas [name] has agreed to follow-up with our clinic in 3 weeks. He was informed of the importance of frequent follow-up visits to maximize his success with intensive lifestyle modifications for his multiple health conditions.   Objective:   Blood pressure (!) 146/71, pulse 71, temperature 98.5 F (36.9 C), height 5\' 10"  (1.778 m), weight 239 lb (108.4 kg), SpO2 97 %. Body mass index  is 34.29 kg/m.  General: Cooperative, alert, well developed, in no acute distress. HEENT: Conjunctivae and lids unremarkable. Cardiovascular: Regular rhythm.  Lungs: Normal work of breathing. Neurologic: No focal deficits.   Lab Results  Component Value Date   CREATININE 0.93 12/26/2021   BUN 11 12/26/2021   NA 138 12/26/2021   K 3.8 12/26/2021   CL 105 12/26/2021   CO2 26 12/26/2021   Lab Results  Component Value Date   ALT 19 12/26/2021   AST 15 12/26/2021   ALKPHOS 53 12/26/2021   BILITOT 0.6 12/26/2021   Lab Results  Component Value Date   HGBA1C 6.3 (H) 12/24/2021   HGBA1C 5.5 09/21/2021   HGBA1C 6.5 02/24/2021   HGBA1C 6.0 12/25/2018   HGBA1C 5.7 (H) 02/12/2018   Lab Results  Component Value Date   INSULIN 12.0 09/21/2021   INSULIN 18.0 04/14/2021   INSULIN 10.9 02/12/2018   INSULIN 14.1 10/23/2017   Lab Results  Component Value Date   TSH 1.150 09/21/2021   Lab Results  Component Value Date   CHOL 166 09/21/2021   HDL 39 (L) 09/21/2021   LDLCALC 113 (H) 09/21/2021   TRIG 73 09/21/2021   CHOLHDL 5 02/24/2021   Lab Results  Component Value Date   VD25OH 55.5 09/21/2021   VD25OH 43.6 04/14/2021   VD25OH 56.1 02/12/2018   Lab Results  Component Value Date   WBC 10.2 12/26/2021   HGB 11.9 (L) 12/26/2021   HCT 34.4 (L) 12/26/2021   MCV 92.2 12/26/2021   PLT 235 12/26/2021   No results found for: "IRON", "  TIBC", "FERRITIN"  Attestation Statements:   Reviewed by clinician on day of visit: allergies, medications, problem list, medical history, surgical history, family history, social history, and previous encounter notes.   Trude Mcburney, am acting as Energy manager for [employer], DO.  I have reviewed the above documentation for accuracy and completeness, and I agree with the above. Corinna Capra, DO

## 2022-06-08 ENCOUNTER — Encounter: Payer: Self-pay | Admitting: Bariatrics

## 2022-06-16 ENCOUNTER — Ambulatory Visit: Payer: BC Managed Care – PPO | Admitting: Bariatrics

## 2022-06-20 ENCOUNTER — Ambulatory Visit: Payer: BC Managed Care – PPO | Admitting: Family Medicine

## 2022-06-20 ENCOUNTER — Encounter: Payer: Self-pay | Admitting: Family Medicine

## 2022-06-20 VITALS — BP 128/74 | HR 70 | Temp 98.1°F | Wt 247.0 lb

## 2022-06-20 DIAGNOSIS — K219 Gastro-esophageal reflux disease without esophagitis: Secondary | ICD-10-CM

## 2022-06-20 DIAGNOSIS — R911 Solitary pulmonary nodule: Secondary | ICD-10-CM

## 2022-06-20 DIAGNOSIS — G4762 Sleep related leg cramps: Secondary | ICD-10-CM

## 2022-06-20 MED ORDER — LANSOPRAZOLE 15 MG PO CPDR: 15.0000 mg | DELAYED_RELEASE_CAPSULE | Freq: Every day | ORAL | 3 refills | Status: DC

## 2022-06-20 MED ORDER — METHOCARBAMOL 500 MG PO TABS: 500.0000 mg | ORAL_TABLET | Freq: Four times a day (QID) | ORAL | 5 refills | Status: DC | PRN

## 2022-06-20 NOTE — Progress Notes (Signed)
   Subjective:    Patient ID: Thomas [name], male    DOB: 1960/11/26, 62 y.o.   MRN: [id number]  HPI Here for several issues. First he needs refills on Lansoppazole. This works quite well to control his GERD. Also he asks for a referral to GI. He has been seeing Dr. Kinnie Scales in the past, but he has retired. He gets upper and lower endoscopies every 5 years and this usually involves esophageal dilations. He is scheduled for a chest CT on 07-11-22 to follow up lung nodules. He also asks about leg cramps that often wake him up at night. He drinks plenty of water every day.    Review of Systems  Constitutional: Negative.   Respiratory: Negative.    Cardiovascular: Negative.   Gastrointestinal: Negative.        Objective:   Physical Exam Constitutional:      Appearance: Normal appearance.  Cardiovascular:     Rate and Rhythm: Normal rate and regular rhythm.     Pulses: Normal pulses.     Heart sounds: Normal heart sounds.  Pulmonary:     Effort: Pulmonary effort is normal.     Breath sounds: Normal breath sounds.  Musculoskeletal:        General: No swelling or tenderness.  Neurological:     Mental Status: He is alert.           Assessment & Plan:  His GERD is stable and we will refill the Lansprazole. We will refer him to [facility] for maintenance studies. For the leg cramps, he will try taking magnesium 400-800 mg at bedtime.  Gershon Crane, MD

## 2022-06-23 ENCOUNTER — Encounter: Payer: Self-pay | Admitting: Bariatrics

## 2022-06-23 ENCOUNTER — Ambulatory Visit: Payer: BC Managed Care – PPO | Admitting: Bariatrics

## 2022-06-23 VITALS — BP 152/84 | HR 74 | Temp 97.9°F | Ht 70.0 in | Wt 237.0 lb

## 2022-06-23 DIAGNOSIS — E669 Obesity, unspecified: Secondary | ICD-10-CM

## 2022-06-23 DIAGNOSIS — Z6834 Body mass index (BMI) 34.0-34.9, adult: Secondary | ICD-10-CM

## 2022-06-23 DIAGNOSIS — R030 Elevated blood-pressure reading, without diagnosis of hypertension: Secondary | ICD-10-CM | POA: Diagnosis not present

## 2022-06-23 DIAGNOSIS — I10 Essential (primary) hypertension: Secondary | ICD-10-CM

## 2022-06-23 DIAGNOSIS — E559 Vitamin D deficiency, unspecified: Secondary | ICD-10-CM

## 2022-06-23 MED ORDER — VITAMIN D (ERGOCALCIFEROL) 1.25 MG (50000 UNIT) PO CAPS
ORAL_CAPSULE | ORAL | 0 refills | Status: DC
Start: 2022-06-23 — End: 2022-08-17

## 2022-06-23 NOTE — Progress Notes (Signed)
   WEIGHT SUMMARY AND BIOMETRICS  Weight Lost Since Last Visit: 2lb   Vitals Temp: 97.9 F (36.6 C) BP: (!) 152/84 Pulse Rate: 74 SpO2: 98 %   Anthropometric Measurements Height: 5\' 10"  (1.778 m) Weight: 237 lb (107.5 kg) BMI (Calculated): 34.01 Weight at Last Visit: 239lb Weight Lost Since Last Visit: 2lb Starting Weight: 249lb Total Weight Loss (lbs): 12 lb (5.443 kg)   Body Composition  Body Fat %: 29.6 % Fat Mass (lbs): 70.2 lbs Muscle Mass (lbs): 158.6 lbs Total Body Water (lbs): 110.8 lbs Visceral Fat Rating : 18   Other Clinical Data Fasting: no Labs: no Today's Visit #: 12 Starting Date: 04/14/21    OBESITY Thomas [name] is here to discuss his progress with his obesity treatment plan along with follow-up of his obesity related diagnoses.     Nutrition Plan: the Category 3 plan /Atkins- 90% adherence ( New Atkins for You,  (Dr. Danny Lawless) )   Current exercise: walking and will add in resistance training.   Interim History:  He is down 2 lbs since his last visit. He is planning on more resistance exercise.  Eating all of the food on the plan., Protein intake is as prescribed, Is not skipping meals, Meeting protein goals., and Water intake is inadequate.  Pharmacotherapy: Hunger is moderately controlled.  Cravings are moderately controlled.  Assessment/Plan:   1. Vitamin D deficiency  Vitamin D is at goal of 50.  Most recent vitamin D level was 55.5. He is on  prescription ergocalciferol 50,000 IU weekly. Lab Results  Component Value Date   VD25OH 55.5 09/21/2021   VD25OH 43.6 04/14/2021   VD25OH 56.1 02/12/2018    Plan: Refill prescription vitamin D 50,000 IU every 2 weeks. # 6 x 0 refills.      Generalized Obesity: Current BMI BMI (Calculated): 34.01    Elevated blood pressure reading:   Patient was rushing into the building. No diagnosis of HTN. Repeat BP: 130/76  Plan: No action at this time. No added salt.   Thomas [name] is  currently in the action stage of change. As such, his goal is to continue with weight loss efforts.  He has agreed to [name] for [place], Dr. Danny Lawless.   Exercise goals: For additional and more extensive health benefits, adults should increase their aerobic physical activity to 300 minutes (5 hours) a week of moderate-intensity, or 150 minutes a week of vigorous-intensity aerobic physical activity, or an equivalent combination of moderate- and vigorous-intensity activity. Additional health benefits are gained by engaging in physical activity beyond this amount.   Behavioral modification strategies: decreasing simple carbohydrates , no meal skipping, meal planning , planning for success, and decreasing sodium intake. He will increase his water intake.   [name] has agreed to follow-up with our clinic in 4 weeks.        Objective:   VITALS: Per patient if applicable, see vitals. GENERAL: Alert and in no acute distress. CARDIOPULMONARY: No increased WOB. Speaking in clear sentences.  PSYCH: Pleasant and cooperative. Speech normal rate and rhythm. Affect is appropriate. Insight and judgement are appropriate. Attention is focused, linear, and appropriate.  NEURO: Oriented as arrived to appointment on time with no prompting.   Attestation Statements:   This was prepared with the assistance of Engineer, civil (consulting).  Occasional wrong-word or sound-a-like substitutions may have occurred due to the inherent limitations of voice recognition software.   Corinna Capra, DO

## 2022-07-11 ENCOUNTER — Ambulatory Visit
Admission: RE | Admit: 2022-07-11 | Discharge: 2022-07-11 | Disposition: A | Discharge status: Home / Self Care | Payer: BC Managed Care – PPO | Source: Ambulatory Visit | Attending: Pulmonary Disease | Admitting: Pulmonary Disease

## 2022-07-11 DIAGNOSIS — R911 Solitary pulmonary nodule: Secondary | ICD-10-CM

## 2022-07-14 ENCOUNTER — Ambulatory Visit (HOSPITAL_BASED_OUTPATIENT_CLINIC_OR_DEPARTMENT_OTHER): Payer: BC Managed Care – PPO | Admitting: Pulmonary Disease

## 2022-07-19 ENCOUNTER — Telehealth: Payer: Self-pay | Admitting: Pulmonary Disease

## 2022-07-19 ENCOUNTER — Ambulatory Visit: Payer: BC Managed Care – PPO | Admitting: Bariatrics

## 2022-07-19 NOTE — Telephone Encounter (Signed)
Patient would like results of CT scan. Patient phone number is [phone].

## 2022-07-19 NOTE — Telephone Encounter (Signed)
Yes I reviewed results with patient. No further CT imaging indicated with nearly resolved infiltates

## 2022-07-25 ENCOUNTER — Encounter: Payer: Self-pay | Admitting: Family Medicine

## 2022-07-25 ENCOUNTER — Telehealth (INDEPENDENT_AMBULATORY_CARE_PROVIDER_SITE_OTHER): Payer: BC Managed Care – PPO | Admitting: Family Medicine

## 2022-07-25 DIAGNOSIS — U071 COVID-19: Secondary | ICD-10-CM | POA: Diagnosis not present

## 2022-07-25 MED ORDER — NIRMATRELVIR/RITONAVIR (PAXLOVID)TABLET: 3.0000 | ORAL_TABLET | Freq: Two times a day (BID) | ORAL | 0 refills | Status: AC

## 2022-07-25 MED ORDER — HYDROCODONE BIT-HOMATROP MBR 5-1.5 MG/5ML PO SOLN
5.0000 mL | ORAL | 0 refills | Status: DC | PRN
Start: 2022-07-25 — End: 2023-01-19

## 2022-07-25 NOTE — Progress Notes (Signed)
Subjective:    Patient ID: Thomas [name], male    DOB: Dec 04, 1960, 62 y.o.   MRN: [id number]  HPI Virtual Visit via Video Note  I connected with the patient on 07/25/22 at 10:15 AM EDT by a video enabled telemedicine application and verified that I am speaking with the correct person using two identifiers.  Location patient: home Location provider:work or home office Persons participating in the virtual visit: patient, provider  I discussed the limitations of evaluation and management by telemedicine and the availability of in person appointments. The patient expressed understanding and agreed to proceed.   HPI: Here for 5 days of fever to 101 degrees, body aches, headache, ST, and coughing up yellow sputum. He has some SOB but this responds to his inhalers. He tested positive for the Covid virus this morning.    ROS: See pertinent positives and negatives per HPI.  Past Medical History:  Diagnosis Date   Allergy    Asthma    Back pain    COVID-18 Nov 2021   Diabetes [facility])    GERD (gastroesophageal reflux disease)    History of hiatal hernia    Nasal polyposis    Pneumonia    5 -7 yrs ago   Swallowing difficulty     Past Surgical History:  Procedure Laterality Date   ANTERIOR CERVICAL DECOMP/DISCECTOMY FUSION N/A 07/14/2016   Procedure: Cervical five-six5- Cervical six-seven Anterior cervical decompression/discectomy/fusion;  Surgeon: Maeola Harman, MD;  Location[facility] OR;  Service: Neurosurgery;  Laterality: N/A;   BRONCHIAL WASHINGS  12/27/2021   Procedure: BRONCHIAL WASHINGS;  Surgeon: Chilton Greathouse, MD;  Location: [facility] ENDOSCOPY;  Service: Cardiopulmonary;;   COLON SURGERY     COLONOSCOPY  12/22/2014   per Dr. Kinnie Scales, clear, repeat in 5 yrs (hx of adenomas)    DG THUMB LEFT HAND     DG THUMB RIGHT HAND ([facility] HX)     FUNCTIONAL ENDOSCOPIC SINUS SURGERY     PARTIAL COLECTOMY     sigmoid, Dr. Kinnie Scales   VIDEO BRONCHOSCOPY N/A 12/27/2021   Procedure: VIDEO  BRONCHOSCOPY WITHOUT FLUORO;  Surgeon: Chilton Greathouse, MD;  Location: [facility] ENDOSCOPY;  Service: Cardiopulmonary;  Laterality: N/A;    Family History  Problem Relation Age of Onset   Cancer Mother    Cancer Father    Hyperlipidemia Father    Diabetes Other        maternal family     Current Outpatient Medications:    albuterol (PROVENTIL) (2.5 MG/3ML) 0.083% nebulizer solution, Take 3 mLs (2.5 mg total) by nebulization every 4 (four) hours as needed for wheezing or shortness of breath., Disp: 75 mL, Rfl: 12   albuterol (VENTOLIN HFA) 108 (90 Base) MCG/ACT inhaler, INHALE 2 PUFFS EVERY 6 HOURS AS NEEDED FOR WHEEZING OR SHORTNESS OF BREATH., Disp: 8.5 each, Rfl: 1   budesonide-formoterol (SYMBICORT) 160-4.5 MCG/ACT inhaler, Inhale 2 puffs into the lungs 2 (two) times daily., Disp: 30.6 g, Rfl: 3   fluticasone (FLONASE) 50 MCG/ACT nasal spray, SPRAY 2 SPRAYS INTO EACH NOSTRIL EVERY DAY, Disp: 48 mL, Rfl: 0   ketoconazole (NIZORAL) 2 % cream, Apply 1 Application topically 2 (two) times daily., Disp: 60 g, Rfl: 3   lansoprazole (PREVACID) 15 MG capsule, Take 1 capsule (15 mg total) by mouth daily., Disp: 90 capsule, Rfl: 3   methocarbamol (ROBAXIN) 500 MG tablet, Take 1 tablet (500 mg total) by mouth every 6 (six) hours as needed for muscle spasms., Disp: 60 tablet, Rfl:  5   montelukast (SINGULAIR) 10 MG tablet, TAKE 1 TABLET BY MOUTH EVERY DAY, Disp: 90 tablet, Rfl: 3   triamcinolone cream (KENALOG) 0.1 %, Apply 1 Application topically 2 (two) times daily as needed (rash)., Disp: 45 g, Rfl: 2   Vitamin D, Ergocalciferol, (DRISDOL) 1.25 MG (50000 UNIT) CAPS capsule, TAKE 1 CAPSULE (50,000 UNITS TOTAL) BY MOUTH EVERY 14 DAYS, Disp: 6 capsule, Rfl: 0  EXAM:  VITALS per patient if applicable:  GENERAL: alert, oriented, appears well and in no acute distress  HEENT: atraumatic, conjunttiva clear, no obvious abnormalities on inspection of external nose and ears  NECK: normal movements of the head  and neck  LUNGS: on inspection no signs of respiratory distress, breathing rate appears normal, no obvious gross SOB, gasping or wheezing  CV: no obvious cyanosis  MS: moves all visible extremities without noticeable abnormality  PSYCH/NEURO: pleasant and cooperative, no obvious depression or anxiety, speech and thought processing grossly intact  ASSESSMENT AND PLAN: Covid infection, treat with 5 days of Paxlovid. Gershon Crane, MD  Discussed the following assessment and plan:  No diagnosis found.     I discussed the assessment and treatment plan with the patient. The patient was provided an opportunity to ask questions and all were answered. The patient agreed with the plan and demonstrated an understanding of the instructions.   The patient was advised to call back or seek an in-person evaluation if the symptoms worsen or if the condition fails to improve as anticipated.      Review of Systems     Objective:   Physical Exam        Assessment & Plan:

## 2022-07-26 ENCOUNTER — Ambulatory Visit: Payer: BC Managed Care – PPO | Admitting: Bariatrics

## 2022-07-28 ENCOUNTER — Ambulatory Visit (HOSPITAL_BASED_OUTPATIENT_CLINIC_OR_DEPARTMENT_OTHER): Payer: BC Managed Care – PPO | Admitting: Pulmonary Disease

## 2022-08-01 ENCOUNTER — Other Ambulatory Visit: Payer: Self-pay | Admitting: Family Medicine

## 2022-08-01 DIAGNOSIS — B9689 Other specified bacterial agents as the cause of diseases classified elsewhere: Secondary | ICD-10-CM

## 2022-08-03 ENCOUNTER — Telehealth: Payer: Self-pay | Admitting: Family Medicine

## 2022-08-03 NOTE — Telephone Encounter (Signed)
Pt called to inform MD that while he is feeling a little better after Covid, he is still having issues with breathing, coughing, and lots of green mucus.   Pt added that he finished the Paxlovid Rx about 3 or 4 days ago.   Pt would like to know what he should do next to treat those lingering symptoms?  Pt is asking for a call back to discuss.   Please advise.

## 2022-08-05 ENCOUNTER — Other Ambulatory Visit: Payer: Self-pay

## 2022-08-05 MED ORDER — AZITHROMYCIN 250 MG PO TABS: ORAL_TABLET | ORAL | 0 refills | Status: AC

## 2022-08-05 NOTE — Telephone Encounter (Signed)
Call in a Zpack  ?

## 2022-08-05 NOTE — Telephone Encounter (Signed)
Pt Rx for Z-Pack sent to pt pharmacy, pt notified

## 2022-08-17 ENCOUNTER — Encounter: Payer: Self-pay | Admitting: Bariatrics

## 2022-08-17 ENCOUNTER — Ambulatory Visit: Payer: BC Managed Care – PPO | Admitting: Bariatrics

## 2022-08-17 VITALS — BP 152/92 | HR 85 | Temp 97.9°F | Ht 70.0 in | Wt 244.0 lb

## 2022-08-17 DIAGNOSIS — E559 Vitamin D deficiency, unspecified: Secondary | ICD-10-CM

## 2022-08-17 DIAGNOSIS — I10 Essential (primary) hypertension: Secondary | ICD-10-CM | POA: Diagnosis not present

## 2022-08-17 DIAGNOSIS — Z6835 Body mass index (BMI) 35.0-35.9, adult: Secondary | ICD-10-CM | POA: Diagnosis not present

## 2022-08-17 DIAGNOSIS — E669 Obesity, unspecified: Secondary | ICD-10-CM | POA: Diagnosis not present

## 2022-08-17 MED ORDER — VITAMIN D (ERGOCALCIFEROL) 1.25 MG (50000 UNIT) PO CAPS
ORAL_CAPSULE | ORAL | 0 refills | Status: DC
Start: 2022-08-17 — End: 2022-09-06

## 2022-08-17 NOTE — Progress Notes (Signed)
WEIGHT SUMMARY AND BIOMETRICS  Weight Gained Since Last Visit: 7lb   Vitals Temp: 97.9 F (36.6 C) BP: (!) 152/92 Pulse Rate: 85 SpO2: 96 %   Anthropometric Measurements Height: 5\' 10"  (1.778 m) Weight: 244 lb (110.7 kg) BMI (Calculated): 35.01 Weight at Last Visit: 237lb Weight Gained Since Last Visit: 7lb Starting Weight: 249lb   Body Composition  Body Fat %: 30.9 % Fat Mass (lbs): 75.4 lbs Muscle Mass (lbs): 160.6 lbs Total Body Water (lbs): 114.6 lbs Visceral Fat Rating : 19   Other Clinical Data Fasting: no Labs: no Today's Visit #: 13 Starting Date: 04/14/21    OBESITY Yahya is here to discuss his progress with his obesity treatment plan along with follow-up of his obesity related diagnoses.     Nutrition Plan: Other New Atkins  - 80% adherence.  Current exercise: walking, yard work, and gym  Interim History:  He is up 7 lbs since his last visit. He has been sick with Covid in the interim. He is eating more bread and will stop now.  Eating all of the food on the plan., Protein intake is as prescribed, and Water intake is inadequate.  Hunger is moderately controlled.  Cravings are moderately controlled.  Assessment/Plan:   1. Vitamin D deficiency Vitamin D Deficiency Vitamin D is at goal of 50.  Most recent vitamin D level was 55. He is on  prescription ergocalciferol 50,000 IU weekly. Lab Results  Component Value Date   VD25OH 55.5 09/21/2021   VD25OH 43.6 04/14/2021   VD25OH 56.1 02/12/2018    Plan: Refill prescription vitamin D 50,000 IU weekly.   Hypertension Hypertension control uncertain.  Medication(s): no medications  BP Readings from Last 3 Encounters:  08/17/22 (!) 152/92  06/23/22 (!) 152/84  06/20/22 128/74   Lab Results  Component Value Date   CREATININE 0.93 12/26/2021   CREATININE 0.99 12/25/2021   CREATININE 1.26 (H) 12/24/2021   Lab Results  Component Value Date   GFR 70.46 02/24/2021   GFR  76.32 10/19/2020   GFR 90.07 12/25/2018    Plan: No added salt. Will keep sodium content to 1,500 mg or less per day.   Will resume the Atkins Plan.    Generalized Obesity: Current BMI BMI (Calculated): 35.01    Jorel is not currently in the action stage of change. As such, his goal is to continue with weight loss efforts.  He has agreed to [employer] .  Exercise goals: All adults should avoid inactivity. Some physical activity is better than none, and adults who participate in any amount of physical activity gain some health benefits.  Behavioral modification strategies: increasing lean protein intake, decreasing simple carbohydrates , meal planning , increase water intake, planning for success, keep healthy foods in the home, and mindful eating.  Laird has agreed to follow-up with our clinic in 3 weeks.        Objective:   VITALS: Per patient if applicable, see vitals. GENERAL: Alert and in no acute distress. CARDIOPULMONARY: No increased WOB. Speaking in clear sentences.  PSYCH: Pleasant and cooperative. Speech normal rate and rhythm. Affect is appropriate. Insight and judgement are appropriate. Attention is focused, linear, and appropriate.  NEURO: Oriented as arrived to appointment on time with no prompting.   Attestation Statements:   This was prepared with the assistance of Engineer, civil (consulting).  Occasional wrong-word or sound-a-like substitutions may have occurred due to the inherent limitations of voice recognition software.   Corinna Capra,  DO

## 2022-08-31 ENCOUNTER — Other Ambulatory Visit: Payer: Self-pay | Admitting: Family Medicine

## 2022-08-31 DIAGNOSIS — J4540 Moderate persistent asthma, uncomplicated: Secondary | ICD-10-CM

## 2022-09-03 ENCOUNTER — Other Ambulatory Visit: Payer: Self-pay | Admitting: Family Medicine

## 2022-09-05 ENCOUNTER — Other Ambulatory Visit: Payer: Self-pay | Admitting: Family Medicine

## 2022-09-05 DIAGNOSIS — B9689 Other specified bacterial agents as the cause of diseases classified elsewhere: Secondary | ICD-10-CM

## 2022-09-06 ENCOUNTER — Ambulatory Visit: Payer: BC Managed Care – PPO | Admitting: Bariatrics

## 2022-09-06 ENCOUNTER — Encounter: Payer: Self-pay | Admitting: Bariatrics

## 2022-09-06 VITALS — BP 157/61 | HR 81 | Temp 97.8°F | Ht 70.0 in | Wt 241.0 lb

## 2022-09-06 DIAGNOSIS — I10 Essential (primary) hypertension: Secondary | ICD-10-CM | POA: Diagnosis not present

## 2022-09-06 DIAGNOSIS — Z6834 Body mass index (BMI) 34.0-34.9, adult: Secondary | ICD-10-CM

## 2022-09-06 DIAGNOSIS — E669 Obesity, unspecified: Secondary | ICD-10-CM

## 2022-09-06 DIAGNOSIS — E559 Vitamin D deficiency, unspecified: Secondary | ICD-10-CM

## 2022-09-06 MED ORDER — VITAMIN D (ERGOCALCIFEROL) 1.25 MG (50000 UNIT) PO CAPS
ORAL_CAPSULE | ORAL | 0 refills | Status: DC
Start: 2022-09-06 — End: 2022-10-06

## 2022-09-06 NOTE — Progress Notes (Signed)
WEIGHT SUMMARY AND BIOMETRICS  Weight Lost Since Last Visit: 3lb  Vitals Temp: 97.8 F (36.6 C) BP: (!) 157/61 Pulse Rate: 81 SpO2: 97 %   Anthropometric Measurements Height: 5\' 10"  (1.778 m) Weight: 241 lb (109.3 kg) BMI (Calculated): 34.58 Weight at Last Visit: 244lb Weight Lost Since Last Visit: 3lb Starting Weight: 249lb   Body Composition  Body Fat %: 29.6 % Fat Mass (lbs): 71.4 lbs Muscle Mass (lbs): 161.2 lbs Total Body Water (lbs): 114.2 lbs Visceral Fat Rating : 18   Other Clinical Data Fasting: no Labs: no Today's Visit #: 14 Starting Date: 04/14/21    OBESITY Thomas [name] is here to discuss his progress with his obesity treatment plan along with follow-up of his obesity related diagnoses.     Nutrition Plan: Other new atkins  - 90% adherence.  Current exercise: walking, weightlifting, and yard work  Interim History:  He is down 3 lbs since his last visit, and he has gained some muscle since his last visit. He is covering from Covid and is just now getting back to his full strength.  Eating all of the food on the plan., Is not skipping meals, and Water intake is adequate.  Hunger is moderately controlled.  Cravings are moderately controlled.  Assessment/Plan:   1. Vitamin D deficiency Vitamin D Deficiency Vitamin D is at goal of 50.  Most recent vitamin D level was 55.5. He is on  prescription ergocalciferol 50,000 IU weekly. Lab Results  Component Value Date   VD25OH 55.5 09/21/2021   VD25OH 43.6 04/14/2021   VD25OH 56.1 02/12/2018    Plan: Refill prescription vitamin D 50,000 IU EVERY 2 weeks.    Hypertension Hypertension control uncertain.  Medication(s):none  BP Readings from Last 3 Encounters:  09/06/22 (!) 157/61  08/17/22 (!) 152/92  06/23/22 (!) 152/84   Lab Results  Component Value Date   CREATININE 0.93 12/26/2021   CREATININE 0.99 12/25/2021   CREATININE 1.26 (H) 12/24/2021   Lab Results  Component Value  Date   GFR 70.46 02/24/2021   GFR 76.32 10/19/2020   GFR 90.07 12/25/2018    Plan: Will follow over time.  No added salt. Will keep sodium content to 1,500 mg or less per day.      Generalized Obesity: Current BMI BMI (Calculated): 34.58    Thomas [name] is currently in the action stage of change. As such, his goal is to continue with weight loss efforts.  He has agreed to [name] ( Dr. Danny Lawless).  .  Exercise goals: For substantial health benefits, adults should do at least 150 minutes (2 hours and 30 minutes) a week of moderate-intensity, or 75 minutes (1 hour and 15 minutes) a week of vigorous-intensity aerobic physical activity, or an equivalent combination of moderate- and vigorous-intensity aerobic activity. Aerobic activity should be performed in episodes of at least 10 minutes, and preferably, it should be spread throughout the week.  Behavioral modification strategies: no meal skipping, meal planning , better snacking choices, planning for success, avoiding temptations, and mindful eating.  Thomas [name] has agreed to follow-up with our clinic in 4 weeks.     Objective:   VITALS: Per patient if applicable, see vitals. GENERAL: Alert and in no acute distress. CARDIOPULMONARY: No increased WOB. Speaking in clear sentences.  PSYCH: Pleasant and cooperative. Speech normal rate and rhythm. Affect is appropriate. Insight and judgement are appropriate. Attention is focused, linear, and appropriate.  NEURO: Oriented as arrived to appointment on time with no prompting.  Attestation Statements:   This was prepared with the assistance of Engineer, civil (consulting).  Occasional wrong-word or sound-a-like substitutions may have occurred due to the inherent limitations of voice recognition software.   Corinna Capra, DO

## 2022-10-06 ENCOUNTER — Ambulatory Visit: Payer: BC Managed Care – PPO | Admitting: Bariatrics

## 2022-10-06 ENCOUNTER — Encounter: Payer: Self-pay | Admitting: Bariatrics

## 2022-10-06 VITALS — BP 148/78 | HR 85 | Temp 98.3°F | Ht 70.0 in | Wt 242.0 lb

## 2022-10-06 DIAGNOSIS — E669 Obesity, unspecified: Secondary | ICD-10-CM | POA: Diagnosis not present

## 2022-10-06 DIAGNOSIS — R030 Elevated blood-pressure reading, without diagnosis of hypertension: Secondary | ICD-10-CM

## 2022-10-06 DIAGNOSIS — Z6834 Body mass index (BMI) 34.0-34.9, adult: Secondary | ICD-10-CM

## 2022-10-06 DIAGNOSIS — E559 Vitamin D deficiency, unspecified: Secondary | ICD-10-CM

## 2022-10-06 MED ORDER — VITAMIN D (ERGOCALCIFEROL) 1.25 MG (50000 UNIT) PO CAPS
ORAL_CAPSULE | ORAL | 0 refills | Status: DC
Start: 2022-10-06 — End: 2022-11-15

## 2022-10-06 NOTE — Progress Notes (Signed)
WEIGHT SUMMARY AND BIOMETRICS  Weight Gained Since Last Visit: 1lb   Vitals Temp: 98.3 F (36.8 C) BP: (!) 170/78 Pulse Rate: 85 SpO2: 99 %   Anthropometric Measurements Height: 5\' 10"  (1.778 m) Weight: 242 lb (109.8 kg) BMI (Calculated): 34.72 Weight at Last Visit: 241lb Weight Gained Since Last Visit: 1lb Starting Weight: 249lb Total Weight Loss (lbs): 7 lb (3.175 kg)   Body Composition  Body Fat %: 30.2 % Fat Mass (lbs): 73.4 lbs Muscle Mass (lbs): 161 lbs Total Body Water (lbs): 114 lbs Visceral Fat Rating : 19   Other Clinical Data Fasting: no Labs: no Today's Visit #: 15 Starting Date: 04/14/21    OBESITY Thomas [name] is here to discuss his progress with his obesity treatment plan along with follow-up of his obesity related diagnoses.     Nutrition Plan: Other New Atkins  - 85% adherence.  Current exercise: walking  Interim History:  He is down 1 lb since his last visit.  Eating all of the food on the plan., Protein intake is as prescribed, Is not skipping meals, and Water intake is adequate.  Hunger is moderately controlled.  Cravings are moderately controlled.  Assessment/Plan:   1. Vitamin D deficiency Vitamin D Deficiency Vitamin D is at goal of 50.  Most recent vitamin D level was 55. He is on  prescription ergocalciferol 50,000 IU weekly. Lab Results  Component Value Date   VD25OH 55.5 09/21/2021   VD25OH 43.6 04/14/2021   VD25OH 56.1 02/12/2018    Plan: Refill prescription vitamin D 50,000 IU weekly.   Elevated blood pressure reading:   His blood pressure is elevated today, but did come down some after he rested in the room. He had a stressful trip to the office. He denies any pain. He states that he has never been diagnosed with blood pressure issues, nor has he taken any medications for hypertension.  His blood pressure has been elevated here at our office on several occasions. He states that his blood pressure is normal  at his PCP office.  He declines blood pressure medications at this time.   Plan: He will contact his PCP to discuss options for addressing his high blood pressure readings.    Generalized obesity: Current BMI BMI (Calculated): 34.72    Demar is currently in the action stage of change. As such, his goal is to continue with weight loss efforts.  He has agreed to [employer] .  Exercise goals: All adults should avoid inactivity. Some physical activity is better than none, and adults who participate in any amount of physical activity gain some health benefits. He has a standard exercise regimen that is going well.   Behavioral modification strategies: decreasing simple carbohydrates , no meal skipping, increase water intake, planning for success, and mindful eating.  Kadarius has agreed to follow-up with our clinic in 4 weeks.      Objective:   VITALS: Per patient if applicable, see vitals. GENERAL: Alert and in no acute distress. CARDIOPULMONARY: No increased WOB. Speaking in clear sentences.  PSYCH: Pleasant and cooperative. Speech normal rate and rhythm. Affect is appropriate. Insight and judgement are appropriate. Attention is focused, linear, and appropriate.  NEURO: Oriented as arrived to appointment on time with no prompting.   Attestation Statements:   This was prepared with the assistance of Engineer, civil (consulting).  Occasional wrong-word or sound-a-like substitutions may have occurred due to the inherent limitations of voice recognition software.   Corinna Capra, DO

## 2022-10-07 ENCOUNTER — Telehealth: Payer: Self-pay | Admitting: Gastroenterology

## 2022-10-07 ENCOUNTER — Encounter: Payer: Self-pay | Admitting: Pharmacist

## 2022-10-07 NOTE — Telephone Encounter (Signed)
Hi Dr. Chales Abrahams,   Supervising Provider: 10/07/22-AM   We received a referral for Gerd symptoms, patient does have GI history with Dr. Kinnie Scales who is now retired. Records are available in Epic for review. Please advise on scheduling. Thanks

## 2022-10-09 NOTE — Telephone Encounter (Signed)
[facility] - 1st available, pl RG

## 2022-10-11 ENCOUNTER — Encounter: Payer: Self-pay | Admitting: Nurse Practitioner

## 2022-10-13 ENCOUNTER — Ambulatory Visit: Payer: BC Managed Care – PPO | Admitting: Family Medicine

## 2022-10-14 ENCOUNTER — Encounter: Payer: Self-pay | Admitting: Family Medicine

## 2022-10-14 ENCOUNTER — Ambulatory Visit (INDEPENDENT_AMBULATORY_CARE_PROVIDER_SITE_OTHER): Payer: BC Managed Care – PPO | Admitting: Family Medicine

## 2022-10-14 VITALS — BP 124/70 | HR 78 | Temp 98.1°F | Wt 248.0 lb

## 2022-10-14 DIAGNOSIS — K5732 Diverticulitis of large intestine without perforation or abscess without bleeding: Secondary | ICD-10-CM

## 2022-10-14 DIAGNOSIS — I10 Essential (primary) hypertension: Secondary | ICD-10-CM | POA: Diagnosis not present

## 2022-10-14 DIAGNOSIS — J3089 Other allergic rhinitis: Secondary | ICD-10-CM

## 2022-10-14 MED ORDER — CIPROFLOXACIN HCL 500 MG PO TABS: 500.0000 mg | ORAL_TABLET | Freq: Two times a day (BID) | ORAL | 0 refills | Status: AC

## 2022-10-14 MED ORDER — METRONIDAZOLE 500 MG PO TABS: 500.0000 mg | ORAL_TABLET | Freq: Three times a day (TID) | ORAL | 0 refills | Status: DC

## 2022-10-14 NOTE — Progress Notes (Signed)
Subjective:    Patient ID: Thomas [name], male    DOB: 1960/08/31, 62 y.o.   MRN: [id number]  HPI Here for several issues. First he thinks he may a low level diverticulitis. He ate some peanuts several days ago, and since then he has had bloating in the abdomen with mild lower abdominal cramps. No fever or nausea. His stools have been slightly firmer than normal. Also for the last 2 weeks  he has had to use his nebulizer 1-2 times a day, which is more than usual. No coughing. Finally he is here to check his BP. He checks this frequently at home, and it has been well controlled, usually in the 120's over 70's. However the past 2 times he was seen in the weight management clinic this has been elevated.    Review of Systems  Constitutional: Negative.   Respiratory:  Positive for shortness of breath. Negative for cough and wheezing.   Cardiovascular: Negative.   Gastrointestinal:  Positive for abdominal distention and abdominal pain. Negative for blood in stool, constipation, diarrhea, nausea and vomiting.       Objective:   Physical Exam Constitutional:      Appearance: Normal appearance. He is not ill-appearing.  Cardiovascular:     Rate and Rhythm: Normal rate and regular rhythm.     Pulses: Normal pulses.     Heart sounds: Normal heart sounds.  Pulmonary:     Effort: Pulmonary effort is normal.     Breath sounds: Normal breath sounds.  Abdominal:     General: Abdomen is flat. Bowel sounds are normal. There is no distension.     Palpations: Abdomen is soft. There is no mass.     Tenderness: There is no guarding or rebound.     Hernia: No hernia is present.     Comments: Mildly tender across the lower abdomen   Musculoskeletal:     Right lower leg: No edema.     Left lower leg: No edema.  Neurological:     Mental Status: He is alert.           Assessment & Plan:  He does seem to have an early diverticulitis, so we will treat this with 10 days of Cipro and Flagyl. I  reminded him to avoid eating nuts. His asthma seems to be mildly worsened by fall allergies. He will follow up if this gets any worse. His HTN is well controlled. I think he gets anxious at the weight management clinic which falsely elevated the BP.  Gershon Crane, MD

## 2022-10-19 ENCOUNTER — Other Ambulatory Visit: Payer: Self-pay | Admitting: Family Medicine

## 2022-10-19 DIAGNOSIS — J4540 Moderate persistent asthma, uncomplicated: Secondary | ICD-10-CM

## 2022-11-03 ENCOUNTER — Ambulatory Visit: Payer: BC Managed Care – PPO | Admitting: Bariatrics

## 2022-11-15 ENCOUNTER — Ambulatory Visit: Payer: BC Managed Care – PPO | Admitting: Bariatrics

## 2022-11-15 ENCOUNTER — Encounter: Payer: Self-pay | Admitting: Bariatrics

## 2022-11-15 VITALS — BP 138/70 | HR 74 | Temp 97.5°F | Ht 70.0 in | Wt 238.0 lb

## 2022-11-15 DIAGNOSIS — I10 Essential (primary) hypertension: Secondary | ICD-10-CM

## 2022-11-15 DIAGNOSIS — E669 Obesity, unspecified: Secondary | ICD-10-CM

## 2022-11-15 DIAGNOSIS — E559 Vitamin D deficiency, unspecified: Secondary | ICD-10-CM | POA: Diagnosis not present

## 2022-11-15 DIAGNOSIS — E6609 Other obesity due to excess calories: Secondary | ICD-10-CM

## 2022-11-15 DIAGNOSIS — Z6834 Body mass index (BMI) 34.0-34.9, adult: Secondary | ICD-10-CM | POA: Diagnosis not present

## 2022-11-15 MED ORDER — VITAMIN D (ERGOCALCIFEROL) 1.25 MG (50000 UNIT) PO CAPS: ORAL_CAPSULE | ORAL | 0 refills | Status: DC

## 2022-11-15 NOTE — Progress Notes (Signed)
WEIGHT SUMMARY AND BIOMETRICS  Weight Lost Since Last Visit: 4lb  Weight Gained Since Last Visit: 0   Vitals Temp: (!) 97.5 F (36.4 C) BP: (!) 164/81 Pulse Rate: 74 SpO2: 98 %   Anthropometric Measurements Height: 5\' 10"  (1.778 m) Weight: 238 lb (108 kg) BMI (Calculated): 34.15 Weight at Last Visit: 242lb Weight Lost Since Last Visit: 4lb Weight Gained Since Last Visit: 0 Starting Weight: 249lb Total Weight Loss (lbs): 11 lb (4.99 kg)   Body Composition  Body Fat %: 29.3 % Fat Mass (lbs): 69.8 lbs Muscle Mass (lbs): 160 lbs Total Body Water (lbs): 112.4 lbs Visceral Fat Rating : 18   Other Clinical Data Fasting: no Labs: no Today's Visit #: 16 Starting Date: 04/14/21    OBESITY Thomas [name] is here to discuss his progress with his obesity treatment plan along with follow-up of his obesity related diagnoses.     Nutrition Plan: Other new atkins  - 80-85% adherence.  Current exercise: yard work  Interim History:  He is 4 lbs down since his last visit. He is feeling strong, and energetic.  Eating all of the food on the plan., Protein intake is as prescribed, Is not skipping meals, and Water intake is adequate.  Hunger is moderately controlled.  Cravings are moderately controlled.  Assessment/Plan:   1. Vitamin D deficiency Vitamin D Deficiency Vitamin D is at goal of 50.  Most recent vitamin D level was 55.5. He is on  prescription ergocalciferol 50,000 IU weekly. Lab Results  Component Value Date   VD25OH 55.5 09/21/2021   VD25OH 43.6 04/14/2021   VD25OH 56.1 02/12/2018    Plan: Refill prescription vitamin D 50,000 IU weekly.   Hypertension Hypertension control uncertain.  Medication(s): none  BP Readings from Last 3 Encounters:  11/15/22 138/70  10/14/22 124/70  10/06/22 (!) 148/78   Lab Results  Component Value Date   CREATININE 0.93 12/26/2021   CREATININE 0.99 12/25/2021   CREATININE 1.26 (H) 12/24/2021   Lab Results   Component Value Date   GFR 70.46 02/24/2021   GFR 76.32 10/19/2020   GFR 90.07 12/25/2018    Plan: Continue to follow the plan and exercise.  No added salt. Will keep sodium content to 1,500 mg or less per day.     Generalized Obesity: Current BMI BMI (Calculated): 34.15    [name] is currently in the action stage of change. As such, his goal is to continue with weight loss efforts.  He has agreed to Other [employer] .  Exercise goals: All adults should avoid inactivity. Some physical activity is better than none, and adults who participate in any amount of physical activity gain some health benefits.  Behavioral modification strategies: increasing lean protein intake, no meal skipping, decrease eating out, meal planning , increase water intake, better snacking choices, planning for success, and mindful eating.  [name] has agreed to follow-up with our clinic in 4 weeks.     Objective:   VITALS: Per patient if applicable, see vitals. GENERAL: Alert and in no acute distress. CARDIOPULMONARY: No increased WOB. Speaking in clear sentences.  PSYCH: Pleasant and cooperative. Speech normal rate and rhythm. Affect is appropriate. Insight and judgement are appropriate. Attention is focused, linear, and appropriate.  NEURO: Oriented as arrived to appointment on time with no prompting.   Attestation Statements:   This was prepared with the assistance of Engineer, civil (consulting).  Occasional wrong-word or sound-a-like substitutions may have occurred due to the inherent limitations of  voice recognition software.   Thomas Capra, DO

## 2022-12-13 ENCOUNTER — Encounter: Payer: Self-pay | Admitting: Bariatrics

## 2022-12-13 ENCOUNTER — Ambulatory Visit: Payer: BC Managed Care – PPO | Admitting: Bariatrics

## 2022-12-13 DIAGNOSIS — E669 Obesity, unspecified: Secondary | ICD-10-CM

## 2022-12-13 DIAGNOSIS — Z6834 Body mass index (BMI) 34.0-34.9, adult: Secondary | ICD-10-CM

## 2022-12-13 DIAGNOSIS — E559 Vitamin D deficiency, unspecified: Secondary | ICD-10-CM | POA: Diagnosis not present

## 2022-12-13 DIAGNOSIS — I10 Essential (primary) hypertension: Secondary | ICD-10-CM | POA: Diagnosis not present

## 2022-12-13 MED ORDER — VITAMIN D (ERGOCALCIFEROL) 1.25 MG (50000 UNIT) PO CAPS
ORAL_CAPSULE | ORAL | 0 refills | Status: DC
Start: 2022-12-13 — End: 2023-06-15

## 2022-12-13 NOTE — Progress Notes (Signed)
WEIGHT SUMMARY AND BIOMETRICS  Weight Lost Since Last Visit: 0lb  Weight Gained Since Last Visit: 2lb   Vitals Temp: 98 F (36.7 C) BP: (!) 169/71 Pulse Rate: 83 SpO2: 96 %   Anthropometric Measurements Height: 5\' 10"  (1.778 m) Weight: 240 lb (108.9 kg) BMI (Calculated): 34.44 Weight at Last Visit: 238lb Weight Lost Since Last Visit: 0lb Weight Gained Since Last Visit: 2lb Starting Weight: 249lb Total Weight Loss (lbs): 9 lb (4.082 kg)   Body Composition  Body Fat %: 30.1 % Fat Mass (lbs): 72.4 lbs Muscle Mass (lbs): 159.8 lbs Total Body Water (lbs): 113.2 lbs Visceral Fat Rating : 18   Other Clinical Data Fasting: no Labs: no Today's Visit #: 17 Starting Date: 04/14/21    OBESITY Thomas [name] is here to discuss his progress with his obesity treatment plan along with follow-up of his obesity related diagnoses.    Nutrition Plan: Other Atkins  - 85% adherence.  Current exercise: walking  Interim History:  He is up 2 lbs, but traveling. He has been eating out more secondary to traveling.  Eating all of the food on the plan., Protein intake is less than prescribed., Is not skipping meals, and Water intake is adequate.   Hunger is moderately controlled.  Cravings are moderately controlled.  Assessment/Plan:   1. Vitamin D deficiency Vitamin D Deficiency Vitamin D is at goal of 50.  Most recent vitamin D level was 55.5. He is on  prescription ergocalciferol 50,000 IU weekly. Lab Results  Component Value Date   VD25OH 55.5 09/21/2021   VD25OH 43.6 04/14/2021   VD25OH 56.1 02/12/2018    Plan: Refill prescription vitamin D 50,000 IU weekly.   Hypertension Hypertension control uncertain. He states that his blood pressure levels are normal at his PCP's office.  Medication(s): none  BP Readings from Last 3 Encounters:  12/13/22 (!) 169/71   11/15/22 138/70  10/14/22 124/70   Lab Results  Component Value Date   CREATININE 0.93 12/26/2021   CREATININE 0.99 12/25/2021   CREATININE 1.26 (H) 12/24/2021   Lab Results  Component Value Date   GFR 70.46 02/24/2021   GFR 76.32 10/19/2020   GFR 90.07 12/25/2018    Plan: He will check his blood pressures at home.  If BP's elevated, will discuss with his PCP.  No added salt. Will keep sodium content to 1,500 mg or less per day.     Generalized Obesity: Current BMI BMI (Calculated): 34.44   Thomas [name] is currently in the action stage of change. As such, his goal is to continue with weight loss efforts.  He has agreed to Other [employer]  .  Exercise goals: For substantial health benefits, adults should do at least 150 minutes (2 hours and 30 minutes) a week of moderate-intensity, or 75 minutes (1 hour and 15 minutes) a week of vigorous-intensity aerobic physical  activity, or an equivalent combination of moderate- and vigorous-intensity aerobic activity. Aerobic activity should be performed in episodes of at least 10 minutes, and preferably, it should be spread throughout the week. He has agreed to go to the gym in the am 3 to 4 days a week.   Behavioral modification strategies: increasing lean protein intake, no meal skipping, meal planning , increase water intake, better snacking choices, planning for success, increasing vegetables, increasing fiber rich foods, keep healthy foods in the home, and mindful eating.  Thomas [name] has agreed to follow-up with our clinic in 4 weeks.   No orders of the defined types were placed in this encounter.   There are no discontinued medications.   No orders of the defined types were placed in this encounter.     Objective:   VITALS: Per patient if applicable, see vitals. GENERAL: Alert and in no acute distress. CARDIOPULMONARY: No increased WOB. Speaking in clear sentences.  PSYCH: Pleasant and cooperative. Speech normal rate and  rhythm. Affect is appropriate. Insight and judgement are appropriate. Attention is focused, linear, and appropriate.  NEURO: Oriented as arrived to appointment on time with no prompting.   Attestation Statements:    This was prepared with the assistance of Engineer, civil (consulting).  Occasional wrong-word or sound-a-like substitutions may have occurred due to the inherent limitations of voice recognition   Corinna Capra, DO

## 2022-12-15 ENCOUNTER — Telehealth: Payer: BC Managed Care – PPO | Admitting: Family Medicine

## 2022-12-15 ENCOUNTER — Encounter: Payer: Self-pay | Admitting: Family Medicine

## 2022-12-15 DIAGNOSIS — J019 Acute sinusitis, unspecified: Secondary | ICD-10-CM | POA: Diagnosis not present

## 2022-12-15 MED ORDER — AZITHROMYCIN 250 MG PO TABS
ORAL_TABLET | ORAL | 0 refills | Status: DC
Start: 2022-12-15 — End: 2023-01-19

## 2022-12-15 NOTE — Progress Notes (Signed)
Subjective:    Patient ID: Thomas [name], male    DOB: 30-Nov-1960, 62 y.o.   MRN: [id number]  HPI Virtual Visit via Video Note  I connected with the patient on 12/15/22 at 10:30 AM EST by a video enabled telemedicine application and verified that I am speaking with the correct person using two identifiers.  Location patient: home Location provider:work or home office Persons participating in the virtual visit: patient, provider  I discussed the limitations of evaluation and management by telemedicine and the availability of in person appointments. The patient expressed understanding and agreed to proceed.   HPI: Here for 4 days of sinus pressure, PND, and coughing up yellow sputum. No fever or SOB. Using his inhalers and saline nasal sprays.    ROS: See pertinent positives and negatives per HPI.  Past Medical History:  Diagnosis Date   Allergy    Asthma    Back pain    COVID-18 Nov 2021   Diabetes [facility])    GERD (gastroesophageal reflux disease)    History of hiatal hernia    Nasal polyposis    Pneumonia    5 -7 yrs ago   Swallowing difficulty     Past Surgical History:  Procedure Laterality Date   ANTERIOR CERVICAL DECOMP/DISCECTOMY FUSION N/A 07/14/2016   Procedure: Cervical five-six5- Cervical six-seven Anterior cervical decompression/discectomy/fusion;  Surgeon: Maeola Harman, MD;  Location: [facility] OR;  Service: Neurosurgery;  Laterality: N/A;   BRONCHIAL WASHINGS  12/27/2021   Procedure: BRONCHIAL WASHINGS;  Surgeon: Chilton Greathouse, MD;  Location: [facility] ENDOSCOPY;  Service: Cardiopulmonary;;   COLON SURGERY     COLONOSCOPY  12/22/2014   per Dr. Kinnie Scales, clear, repeat in 5 yrs (hx of adenomas)    DG THUMB LEFT HAND     DG THUMB RIGHT HAND ([facility] HX)     FUNCTIONAL ENDOSCOPIC SINUS SURGERY     PARTIAL COLECTOMY     sigmoid, Dr. Kinnie Scales   VIDEO BRONCHOSCOPY N/A 12/27/2021   Procedure: VIDEO BRONCHOSCOPY WITHOUT FLUORO;  Surgeon: Chilton Greathouse, MD;  Location: [facility]  ENDOSCOPY;  Service: Cardiopulmonary;  Laterality: N/A;    Family History  Problem Relation Age of Onset   Cancer Mother    Cancer Father    Hyperlipidemia Father    Diabetes Other        maternal family     Current Outpatient Medications:    albuterol (PROVENTIL) (2.5 MG/3ML) 0.083% nebulizer solution, Take 3 mLs (2.5 mg total) by nebulization every 4 (four) hours as needed for wheezing or shortness of breath., Disp: 75 mL, Rfl: 12   albuterol (VENTOLIN HFA) 108 (90 Base) MCG/ACT inhaler, INHALE 2 PUFFS EVERY 6 HOURS AS NEEDED FOR WHEEZING OR SHORTNESS OF BREATH., Disp: 6.7 each, Rfl: 1   budesonide-formoterol (SYMBICORT) 160-4.5 MCG/ACT inhaler, Inhale 2 puffs into the lungs 2 (two) times daily., Disp: 30.6 g, Rfl: 3   fluticasone (FLONASE) 50 MCG/ACT nasal spray, SPRAY 2 SPRAYS INTO EACH NOSTRIL EVERY DAY, Disp: 48 mL, Rfl: 0   ketoconazole (NIZORAL) 2 % cream, Apply 1 Application topically 2 (two) times daily., Disp: 60 g, Rfl: 3   lansoprazole (PREVACID) 15 MG capsule, Take 1 capsule (15 mg total) by mouth daily., Disp: 90 capsule, Rfl: 3   methocarbamol (ROBAXIN) 500 MG tablet, Take 1 tablet (500 mg total) by mouth every 6 (six) hours as needed for muscle spasms., Disp: 60 tablet, Rfl: 5   metroNIDAZOLE (FLAGYL) 500 MG tablet, Take 1 tablet (500 mg  total) by mouth 3 (three) times daily., Disp: 30 tablet, Rfl: 0   montelukast (SINGULAIR) 10 MG tablet, TAKE 1 TABLET BY MOUTH EVERY DAY, Disp: 90 tablet, Rfl: 3   triamcinolone cream (KENALOG) 0.1 %, Apply 1 Application topically 2 (two) times daily as needed (rash)., Disp: 45 g, Rfl: 2   Vitamin D, Ergocalciferol, (DRISDOL) 1.25 MG (50000 UNIT) CAPS capsule, TAKE 1 CAPSULE (50,000 UNITS TOTAL) BY MOUTH EVERY 14 DAYS, Disp: 6 capsule, Rfl: 0   HYDROcodone bit-homatropine (HYCODAN) 5-1.5 MG/5ML syrup, Take 5 mLs by mouth every 4 (four) hours as needed. (Patient not taking: Reported on 12/15/2022), Disp: 240 mL, Rfl: 0  EXAM:  VITALS per  patient if applicable:  GENERAL: alert, oriented, appears well and in no acute distress  HEENT: atraumatic, conjunttiva clear, no obvious abnormalities on inspection of external nose and ears  NECK: normal movements of the head and neck  LUNGS: on inspection no signs of respiratory distress, breathing rate appears normal, no obvious gross SOB, gasping or wheezing  CV: no obvious cyanosis  MS: moves all visible extremities without noticeable abnormality  PSYCH/NEURO: pleasant and cooperative, no obvious depression or anxiety, speech and thought processing grossly intact  ASSESSMENT AND PLAN: Sinusitis, treat with a Zpack. Gershon Crane, MD  Discussed the following assessment and plan:  No diagnosis found.     I discussed the assessment and treatment plan with the patient. The patient was provided an opportunity to ask questions and all were answered. The patient agreed with the plan and demonstrated an understanding of the instructions.   The patient was advised to call back or seek an in-person evaluation if the symptoms worsen or if the condition fails to improve as anticipated.      Review of Systems     Objective:   Physical Exam        Assessment & Plan:

## 2022-12-20 ENCOUNTER — Telehealth: Payer: Self-pay | Admitting: Family Medicine

## 2022-12-20 ENCOUNTER — Telehealth: Payer: Self-pay | Admitting: Pulmonary Disease

## 2022-12-20 NOTE — Telephone Encounter (Signed)
Patient is experiencing shortness of breath. Dr.Ellison doesn't have anything available so he said he will contact his primary doctor.

## 2022-12-20 NOTE — Telephone Encounter (Signed)
Patient had a virtual visit on 12/15/22.  He states Dr. Clent Ridges told him to let him know if he didn't get better or started feeling worse.  He wants a return call letting him know of Dr. Clent Ridges would call him in something or if he needs to come in for his appointment tomorrow.  He said it would help a lot if something could just be called in.

## 2022-12-21 ENCOUNTER — Ambulatory Visit: Payer: BC Managed Care – PPO | Admitting: Family Medicine

## 2022-12-21 ENCOUNTER — Other Ambulatory Visit: Payer: Self-pay

## 2022-12-21 ENCOUNTER — Encounter: Payer: Self-pay | Admitting: Family Medicine

## 2022-12-21 VITALS — BP 126/78 | HR 69 | Temp 98.1°F | Wt 246.0 lb

## 2022-12-21 DIAGNOSIS — J019 Acute sinusitis, unspecified: Secondary | ICD-10-CM

## 2022-12-21 DIAGNOSIS — J4540 Moderate persistent asthma, uncomplicated: Secondary | ICD-10-CM | POA: Diagnosis not present

## 2022-12-21 MED ORDER — METHYLPREDNISOLONE ACETATE 40 MG/ML IJ SUSP
40.0000 mg | Freq: Once | INTRAMUSCULAR | Status: AC
Start: 2022-12-21 — End: 2022-12-21
  Administered 2022-12-21: 40 mg via INTRAMUSCULAR

## 2022-12-21 MED ORDER — METHYLPREDNISOLONE ACETATE 80 MG/ML IJ SUSP
80.0000 mg | Freq: Once | INTRAMUSCULAR | Status: AC
Start: 2022-12-21 — End: 2022-12-21
  Administered 2022-12-21: 80 mg via INTRAMUSCULAR

## 2022-12-21 MED ORDER — DOXYCYCLINE HYCLATE 100 MG PO TABS: 100.0000 mg | ORAL_TABLET | Freq: Two times a day (BID) | ORAL | 0 refills | Status: DC

## 2022-12-21 NOTE — Progress Notes (Signed)
   Subjective:    Patient ID: Thomas [name], male    DOB: Jun 26, 1960, 62 y.o.   MRN: [id number]  HPI Here for several weeks of coughing up yellow sputum and stuffy head. We had a virtual visit on 12-15-22 for this, and he was felt to have a sinusitis. He was treated with a Zpack, and his head felt better. However he kept on coughing, and now he has some SOB at times. Still no fever.    Review of Systems  Constitutional: Negative.   HENT:  Positive for congestion, postnasal drip and sinus pressure. Negative for ear pain and sore throat.   Eyes: Negative.   Respiratory:  Positive for cough, chest tightness, shortness of breath and wheezing.        Objective:   Physical Exam Constitutional:      Appearance: Normal appearance. He is not ill-appearing.  HENT:     Right Ear: Tympanic membrane, ear canal and external ear normal.     Left Ear: Tympanic membrane, ear canal and external ear normal.     Nose: Nose normal.     Mouth/Throat:     Pharynx: Oropharynx is clear.  Eyes:     Conjunctiva/sclera: Conjunctivae normal.  Pulmonary:     Effort: Pulmonary effort is normal.     Breath sounds: Normal breath sounds.  Lymphadenopathy:     Cervical: No cervical adenopathy.  Neurological:     Mental Status: He is alert.           Assessment & Plan:  He has had a sinusitis, and this has triggered a flare of his asthma. We sent in a RX for 10 days of Doxycycline this morning. He is given a shot of DepoMedrol today. He will use his inhaler and his nebulizer as needed.  Gershon Crane, MD

## 2022-12-21 NOTE — Telephone Encounter (Signed)
Call in Doxycycline 100 mg BID for 10 days  

## 2022-12-21 NOTE — Addendum Note (Signed)
Addended by: Carola Rhine on: 12/21/2022 03:29 PM   Modules accepted: Orders

## 2022-12-21 NOTE — Telephone Encounter (Signed)
Rx sent pt notified 

## 2022-12-23 ENCOUNTER — Ambulatory Visit: Payer: BC Managed Care – PPO | Admitting: Nurse Practitioner

## 2022-12-23 ENCOUNTER — Encounter: Payer: Self-pay | Admitting: Nurse Practitioner

## 2022-12-23 VITALS — BP 142/68 | HR 74 | Ht 71.0 in | Wt 245.0 lb

## 2022-12-23 DIAGNOSIS — K219 Gastro-esophageal reflux disease without esophagitis: Secondary | ICD-10-CM

## 2022-12-23 DIAGNOSIS — Z860100 Personal history of colon polyps, unspecified: Secondary | ICD-10-CM

## 2022-12-23 NOTE — Patient Instructions (Addendum)
We will contact your regarding your next colonoscopy.  Due to recent changes in healthcare laws, you may see the results of your imaging and laboratory studies on MyChart before your provider has had a chance to review them.  We understand that in some cases there may be results that are confusing or concerning to you. Not all laboratory results come back in the same time frame and the provider may be waiting for multiple results in order to interpret others.  Please give [name] 48 hours in order for your provider to thoroughly review all the results before contacting the office for clarification of your results.   Thank you for trusting me with your gastrointestinal care!

## 2022-12-23 NOTE — Progress Notes (Signed)
Brief Narrative 62 y.o. yo male with a past medical history not limited to sigmoid diverticulosis / diverticulitis, GERD, hiatal hernia, hypertension, vitamin D deficiency, colon polyps.  Patient is new to the practice, referred by PCP for history of GERD.  Patient's previous gastroenterologist Dr. Kinnie Scales has retired and patient transferring care to our practice   ASSESSMENT    Chronic GERD without Barrett's esophagus / 5 cm hiatal hernia on last EGD in 2020.  -- Asymptomatic on daily PPI  Remote history of colon polyps ( ? Year).   No polyps on surveillance colonoscopies.  No family history of colon cancer.   See PMH for any additional medical history   PLAN   --Remote history of adenomatous colon polyps but none on last 3 colonoscopies in 2011, 2016 and 2020. Patient believes he is due soon for follow up colonoscopy ( and EGD).  Will review with Dr. Chales Abrahams but I suspect he will not need another colonoscopy until 2030 (10 years from his last one). Additionally, GERD symptoms controlled on PPI. There isn't an indication for a repeat EGD  -- Continue low-dose daily Prevacid 15 mg before breakfast  HPI   Chief complaint : Longstanding GERD, doing well on Prevacid.  History of colon polyps.   Chronic GERD Thomas [name] has a history of chronic GERD but reflux symptoms have been controlled for years on daily PPI.  He takes Prevacid 15 mg daily.  No dysphagia.  Weight is stable.   History of colon polyps.  Patient also has a remote history of colon polyps.  Unclear when he had the polyps removed but it was prior to 2011. No polyps on subsequent colonoscopies x 3 .  He isn't having any bowel changes. No blood in stool. No FMH of colon cancer.   He has a history diverticulitis complicated by perforation in 2009 for which he is status post  left colon resection.  In 2022 he had an episode of recurrent left-sided diverticulitis.   Thomas [name] is inquiring about whether it is time for follow up  colonoscopy. He requests EGD be done at same time because they have always have been done together.    GI History / Studies   **May not be a complete list of studies  Polyp surveillance colonoscopy  Nov 2011 - Dr. Kinnie Scales -- No polyps.  Mild left-sided diverticulosis status post sigmoid colectomy.  Small internal hemorrhoids  EGD for left upper quadrant pain November 2016-Dr. Medoff -- Normal  Polyp surveillance colonoscopy November 2016-Dr. Medoff --No polyps.  Mild left-sided diverticulosis  EGD June 2020 for GERD-Dr. Kinnie Scales -5 cm hiatal hernia. Otherwise normal  Polyp surveillance colonoscopy June 2020-Dr. Medoff -No polyps.  Moderate left-sided diverticulosis    CT Chest Wo Contrast CLINICAL DATA:  Nodule  EXAM: CT CHEST WITHOUT CONTRAST  TECHNIQUE: Multidetector CT imaging of the chest was performed following the standard protocol without IV contrast.  RADIATION DOSE REDUCTION: This exam was performed according to the departmental dose-optimization program which includes automated exposure control, adjustment of the mA and/or kV according to patient size and/or use of iterative reconstruction technique.  COMPARISON:  Chest CT dated December 25, 2021  FINDINGS: Cardiovascular: Normal heart size. No pericardial effusion. Normal caliber thoracic aorta with no atherosclerotic disease.  Mediastinum/Nodes: Small hiatal hernia. Thyroid is unremarkable. No enlarged lymph nodes seen in the chest.  Lungs/Pleura: Central airways are patent. No consolidation, pleural effusion or pneumothorax. Near-complete interval resolution of bilateral consolidations with minimal residual ground-glass opacities seen  in the bilateral upper lobes. Stable 3 mm solid pulmonary nodule of the right upper lobe located on series 3, image 24.  Upper Abdomen: No acute abnormality.  Musculoskeletal: No chest wall mass or suspicious bone lesions identified.  IMPRESSION: 1. Near-complete  interval resolution of bilateral consolidations with minimal residual ground-glass opacities seen in the bilateral upper lobes. 2. Stable 3 mm solid pulmonary nodule of the right upper lobe. No follow-up needed if patient is low-risk.This recommendation follows the consensus statement: Guidelines for Management of Incidental Pulmonary Nodules Detected on CT Images: From the Fleischner Society 2017; Radiology 2017; 284:228-243.  Electronically Signed   By: Allegra Lai M.D.   On: 07/15/2022 17:57    Past Medical History:  Diagnosis Date   Allergy    Asthma    Back pain    COVID-18 Nov 2021   Diabetes [facility])    GERD (gastroesophageal reflux disease)    History of hiatal hernia    Nasal polyposis    Pneumonia    5 -7 yrs ago   Swallowing difficulty    Past Surgical History:  Procedure Laterality Date   ANTERIOR CERVICAL DECOMP/DISCECTOMY FUSION N/A 07/14/2016   Procedure: Cervical five-six5- Cervical six-seven Anterior cervical decompression/discectomy/fusion;  Surgeon: Maeola Harman, MD;  Location: [facility] OR;  Service: Neurosurgery;  Laterality: N/A;   BRONCHIAL WASHINGS  12/27/2021   Procedure: BRONCHIAL WASHINGS;  Surgeon: Chilton Greathouse, MD;  Location: [facility] ENDOSCOPY;  Service: Cardiopulmonary;;   COLON SURGERY     COLONOSCOPY  12/22/2014   per Dr. Kinnie Scales, clear, repeat in 5 yrs (hx of adenomas)    DG THUMB LEFT HAND     DG THUMB RIGHT HAND ([facility] HX)     FUNCTIONAL ENDOSCOPIC SINUS SURGERY     PARTIAL COLECTOMY     sigmoid (12in), Dr. Kinnie Scales   VIDEO BRONCHOSCOPY N/A 12/27/2021   Procedure: VIDEO BRONCHOSCOPY WITHOUT FLUORO;  Surgeon: Chilton Greathouse, MD;  Location: [facility] ENDOS[facility];  Service: Cardiopulmonary;  Laterality: N/A;   Family History  Problem Relation Age of Onset   Cancer Mother    Cancer Father    Hyperlipidemia Father    Diabetes Other        maternal family   Social History   Tobacco Use   Smoking status: Former    Current packs/day: 0.00     Average packs/day: 1 pack/day for 10.0 years (10.0 ttl pk-yrs)    Types: Cigarettes    Start date: 02/01/1976    Quit date: 01/31/1986    Years since quitting: 36.9   Smokeless tobacco: Never  Vaping Use   Vaping status: Never Used  Substance Use Topics   Alcohol use: Yes    Comment: rarely   Drug use: No   Current Outpatient Medications  Medication Sig Dispense Refill   albuterol (PROVENTIL) (2.5 MG/3ML) 0.083% nebulizer solution Take 3 mLs (2.5 mg total) by nebulization every 4 (four) hours as needed for wheezing or shortness of breath. 75 mL 12   albuterol (VENTOLIN HFA) 108 (90 Base) MCG/ACT inhaler INHALE 2 PUFFS EVERY 6 HOURS AS NEEDED FOR WHEEZING OR SHORTNESS OF BREATH. 6.7 each 1   azithromycin (ZITHROMAX Z-PAK) 250 MG tablet As directed 6 each 0   budesonide-formoterol (SYMBICORT) 160-4.5 MCG/ACT inhaler Inhale 2 puffs into the lungs 2 (two) times daily. 30.6 g 3   doxycycline (VIBRA-TABS) 100 MG tablet Take 1 tablet (100 mg total) by mouth 2 (two) times daily. 20 tablet 0   fluticasone (FLONASE) 50  MCG/ACT nasal spray SPRAY 2 SPRAYS INTO EACH NOSTRIL EVERY DAY 48 mL 0   HYDROcodone bit-homatropine (HYCODAN) 5-1.5 MG/5ML syrup Take 5 mLs by mouth every 4 (four) hours as needed. 240 mL 0   ketoconazole (NIZORAL) 2 % cream Apply 1 Application topically 2 (two) times daily. 60 g 3   lansoprazole (PREVACID) 15 MG capsule Take 1 capsule (15 mg total) by mouth daily. 90 capsule 3   methocarbamol (ROBAXIN) 500 MG tablet Take 1 tablet (500 mg total) by mouth every 6 (six) hours as needed for muscle spasms. 60 tablet 5   montelukast (SINGULAIR) 10 MG tablet TAKE 1 TABLET BY MOUTH EVERY DAY 90 tablet 3   triamcinolone cream (KENALOG) 0.1 % Apply 1 Application topically 2 (two) times daily as needed (rash). 45 g 2   Vitamin D, Ergocalciferol, (DRISDOL) 1.25 MG (50000 UNIT) CAPS capsule TAKE 1 CAPSULE (50,000 UNITS TOTAL) BY MOUTH EVERY 14 DAYS 6 capsule 0   No current facility-administered  medications for this visit.   Allergies  Allergen Reactions   No Known Allergies    Review of Systems: All systems reviewed and negative except where noted in HPI.   Wt Readings from Last 3 Encounters:  12/23/22 245 lb (111.1 kg)  12/21/22 246 lb (111.6 kg)  12/13/22 240 lb (108.9 kg)    Physical Exam:  BP (!) 142/68   Pulse 74   Ht 5\' 11"  (1.803 m)   Wt 245 lb (111.1 kg)   SpO2 96%   BMI 34.17 kg/m  Constitutional:  Pleasant, generally well appearing male in no acute distress. Psychiatric:  Normal mood and affect. Behavior is normal. EENT: Pupils normal.  Conjunctivae are normal. No scleral icterus. Neck supple.  Cardiovascular: Normal rate, regular rhythm.  Pulmonary/chest: Effort normal and breath sounds normal. No wheezing, rales or rhonchi. Abdominal: Soft, nondistended, nontender. Bowel sounds active throughout. There are no masses palpable. No hepatomegaly. Neurological: Alert and oriented to person place and time.  Thomas Cluster, NP  12/23/2022, 11:01 AM  Cc:  Referring Provider Nelwyn Salisbury, MD

## 2023-01-02 ENCOUNTER — Other Ambulatory Visit: Payer: Self-pay | Admitting: Pulmonary Disease

## 2023-01-04 MED ORDER — MONTELUKAST SODIUM 10 MG PO TABS: 10.0000 mg | ORAL_TABLET | Freq: Every day | ORAL | 3 refills | Status: AC

## 2023-01-10 ENCOUNTER — Ambulatory Visit: Payer: BC Managed Care – PPO | Admitting: Bariatrics

## 2023-01-15 NOTE — Progress Notes (Signed)
Agree with assessment/plan.  Raj Florestine Carmical, MD [facility] [phone]  

## 2023-01-19 ENCOUNTER — Encounter (HOSPITAL_BASED_OUTPATIENT_CLINIC_OR_DEPARTMENT_OTHER): Payer: Self-pay | Admitting: Pulmonary Disease

## 2023-01-19 ENCOUNTER — Ambulatory Visit (HOSPITAL_BASED_OUTPATIENT_CLINIC_OR_DEPARTMENT_OTHER): Payer: BC Managed Care – PPO | Admitting: Pulmonary Disease

## 2023-01-19 VITALS — BP 136/64 | HR 82 | Resp 16 | Ht 71.0 in | Wt 248.0 lb

## 2023-01-19 DIAGNOSIS — J4541 Moderate persistent asthma with (acute) exacerbation: Secondary | ICD-10-CM | POA: Diagnosis not present

## 2023-01-19 DIAGNOSIS — J4540 Moderate persistent asthma, uncomplicated: Secondary | ICD-10-CM

## 2023-01-19 MED ORDER — ALBUTEROL SULFATE HFA 108 (90 BASE) MCG/ACT IN AERS
INHALATION_SPRAY | RESPIRATORY_TRACT | 1 refills | Status: DC
Start: 2023-01-19 — End: 2023-04-14

## 2023-01-19 MED ORDER — PREDNISONE 10 MG PO TABS: ORAL_TABLET | ORAL | 0 refills | Status: AC

## 2023-01-19 NOTE — Addendum Note (Signed)
Addended by: Luciano Cutter on: 01/19/2023 04:32 PM   Modules accepted: Orders

## 2023-01-19 NOTE — Progress Notes (Signed)
Subjective:   PATIENT ID: Thomas [name] GENDER: male DOB: 23-Jun-1960, MRN: [id number]  Chief Complaint  Patient presents with   Follow-up    Asthma- breathing has been good but having little palpitations in chest. He states that he has been doing too many treatments but just getting over bronchial infection so not sure.     Reason for Visit: Follow-up  Thomas [name] is a 62 year old male with asthma, nasal polyposis, chronic sinusitis, DM, GERD who presents for follow-up for recurrent pneumonias.  Synopsis: Has been well controlled on Symbicort for >5 years. After switching to [facility] he feels he has had increased respiratory symptoms including recent hospitalization in 12/2021. Reviewed chart in EMR including recent pulmonary note on 01/07/22. Developed Covid 19 in 10/2021. Since then has had multiple episodes of respiratory failure with pulmonary infiltrates. Has been offered biologics however since he had stable asthma symptoms prior to this, this was avoided.  Denies shortness of breath, cough or wheezing today. Pollen is aggravating but otherwise doing well on his Symbicort.   01/19/23 Since our last visit he had a URI in November and seen by his PCP. Zpack did not improve so given flagyl. Since then he is no longer coughing sputum, he reports he has been using his nebulizer 2-3 times a week. Has been compliant with his Symbicort. Having some palpitations  Prior inhalers: Breo - double pneumonia/hospitalization. Does not tolerate powders Breztri - ok until he can switch to Symbicort  Social History: Smoked in his 55's to age 40, approximately 1/2-1ppd, quid 1990.  Previously worked as Education officer, environmental. He drives to [facility] every weekend for work. 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  Quit in 1990 Has  adult kids in 30-40s. Five grandchildren  Past Medical History:  Diagnosis Date   Allergy    Asthma    Back pain    COVID-18 Nov 2021   Diabetes [facility])    GERD (gastroesophageal reflux disease)    History of hiatal hernia    Nasal polyposis    Pneumonia    5 -7 yrs ago   Swallowing difficulty      Family History  Problem Relation Age of Onset   Cancer Mother    Cancer Father    Hyperlipidemia Father    Diabetes Other        maternal family     Social History   Occupational History   Occupation: Education officer, environmental full time  Tobacco Use   Smoking status: Former    Current packs/day: 0.00    Average packs/day: 1 pack/day for 10.0 years (10.0 ttl pk-yrs)    Types: Cigarettes    Start date: 02/01/1976    Quit date: 01/31/1986    Years since quitting: 36.9   Smokeless tobacco: Never  Vaping Use   Vaping status: Never Used  Substance and Sexual Activity   Alcohol use: Yes    Comment: rarely   Drug use: No   Sexual activity: Not on file    Allergies  Allergen Reactions   No Known Allergies      Outpatient Medications Prior to Visit  Medication Sig Dispense Refill   albuterol (PROVENTIL) (2.5 MG/3ML) 0.083% nebulizer solution Take 3 mLs (2.5 mg total) by nebulization every 4 (four) hours as needed for  wheezing or shortness of breath. 75 mL 12   budesonide-formoterol (SYMBICORT) 160-4.5 MCG/ACT inhaler Inhale 2 puffs into the lungs 2 (two) times daily. 30.6 g 3   fluticasone (FLONASE) 50 MCG/ACT nasal spray SPRAY 2 SPRAYS INTO EACH NOSTRIL EVERY DAY 48 mL 0   ketoconazole (NIZORAL) 2 % cream Apply 1 Application topically 2 (two) times daily. 60 g 3   lansoprazole (PREVACID) 15 MG capsule Take 1 capsule (15 mg total) by mouth daily. 90 capsule 3   methocarbamol (ROBAXIN) 500 MG tablet Take 1 tablet (500 mg total) by mouth every 6 (six) hours as needed for muscle spasms. 60 tablet 5   montelukast (SINGULAIR) 10 MG tablet Take 1 tablet (10 mg total) by mouth daily. 90 tablet 3    triamcinolone cream (KENALOG) 0.1 % Apply 1 Application topically 2 (two) times daily as needed (rash). 45 g 2   Vitamin D, Ergocalciferol, (DRISDOL) 1.25 MG (50000 UNIT) CAPS capsule TAKE 1 CAPSULE (50,000 UNITS TOTAL) BY MOUTH EVERY 14 DAYS 6 capsule 0   albuterol (VENTOLIN HFA) 108 (90 Base) MCG/ACT inhaler INHALE 2 PUFFS EVERY 6 HOURS AS NEEDED FOR WHEEZING OR SHORTNESS OF BREATH. 6.7 each 1   azithromycin (ZITHROMAX Z-PAK) 250 MG tablet As directed 6 each 0   doxycycline (VIBRA-TABS) 100 MG tablet Take 1 tablet (100 mg total) by mouth 2 (two) times daily. 20 tablet 0   HYDROcodone bit-homatropine (HYCODAN) 5-1.5 MG/5ML syrup Take 5 mLs by mouth every 4 (four) hours as needed. 240 mL 0   No facility-administered medications prior to visit.    Review of Systems  Constitutional:  Negative for chills, diaphoresis, fever, malaise/fatigue and weight loss.  HENT:  Negative for congestion.   Respiratory:  Positive for cough and wheezing. Negative for hemoptysis, sputum production and shortness of breath.   Cardiovascular:  Negative for chest pain, palpitations and leg swelling.     Objective:   Vitals:   01/19/23 1128  BP: 136/64  Pulse: 82  Resp: 16  SpO2: 97%  Weight: 248 lb (112.5 kg)  Height: 5\' 11"  (1.803 m)   SpO2: 97 %  Physical Exam: General: Well-appearing, no acute distress HENT: [place], AT Eyes: EOMI, no scleral icterus Respiratory: Clear to auscultation bilaterally.  No crackles, wheezing or rales Cardiovascular: RRR, -M/R/G, no JVD Extremities:-Edema,-tenderness Neuro: AAO x4, CNII-XII grossly intact Psych: Normal mood, normal affect  Data Reviewed:  Imaging: CT A/P 07/04/11 - Visualized lung bases without scarring or atelectasis CT A/P 10/03/20 - Minimal left basilar scarring/atelectasis CTA 12/24/21 - RUL 3 mm subpleural nodule. Bilateral patchy GGO CXR 12/28/22 Worsening RML/RLL opacities  PFT: 01/04/18 FVC 5.11 (122%) FEV1 4.38 (133%) Ratio 86  TLC 66% DLCO  114%. No significant BD response Interpretation: Mild restrictive defect. Normal DLC  Labs: CBC    Component Value Date/Time   WBC 10.2 12/26/2021 0350   RBC 3.73 (L) 12/26/2021 0350   HGB 11.9 (L) 12/26/2021 0350   HGB 16.0 10/23/2017 0953   HCT 34.4 (L) 12/26/2021 0350   HCT 42.6 10/23/2017 0953   PLT 235 12/26/2021 0350   MCV 92.2 12/26/2021 0350   MCV 88 10/23/2017 0953   MCH 31.9 12/26/2021 0350   MCHC 34.6 12/26/2021 0350   RDW 12.5 12/26/2021 0350   RDW 13.4 10/23/2017 0953   LYMPHSABS 0.7 12/24/2021 0000   LYMPHSABS 2.2 10/23/2017 0953   MONOABS 0.9 12/24/2021 0000   EOSABS 0.1 12/24/2021 0000   EOSABS 0.5 (H) 10/23/2017 0953   BASOSABS  0.1 12/24/2021 0000   BASOSABS 0.0 10/23/2017 0953   Absolute eos 10/23/17 - 500 12/24/21 - 100 - improved     Assessment & Plan:   Discussion: 62 year old male with asthma, nasal polyposis, chronic sinusitis, DM, GERD who presents for follow-up for asthma and hx recurrent pneumonias. Has had recurrent URI/pna since 10/2021. CT scan in 07/2022 with resolved infiltrates. Previously well controlled asthma on Symbicort for >5 years. Now on generic Symbicort with good control until this recent exacerbation. Improving but remains with mild symptoms.   RML/RLL consolidation - resolved on June CT scan S/p bronchoscopy with BAL 12/28/22. Predominantly monocytic and neutrophilic, 56% and 41% respectively --Cultures and cytology negative --No further follow-up  Moderate persistent asthma with mild exacerbation Mild restrictive defect with normal DLCO - no evidence of fibrosis on imaging --Short term prednisone taper to improve cough --CONTINUE Symbicort 160-4.5 mcg TWO puffs in the morning and evening. Rinse mouth out after use  [employer] health plan stating patient insurance approved continued coverage of non-formulary Symbicort until 10/02/2023.  --CONTINUE nebulizer as needed  Nasal congestion Previously on Astelin 0.15%. See if taste  and smell improve off of this medication. Use flonase for now  Health Maintenance Immunization History  Administered Date(s) Administered   Fluad Quad(high Dose 65+) 01/03/2019, 12/04/2019, 12/29/2020   Influenza, High Dose Seasonal PF 03/14/2018   PFIZER Comirnaty(Gray Top)Covid-19 Tri-Sucrose Vaccine 05/25/2020   PFIZER(Purple Top)SARS-COV-2 Vaccination 03/12/2019, 04/06/2019, 01/14/2020   Tdap 08/21/2014   CT Lung Screen - not qualified. Insufficient tobacco history  No orders of the defined types were placed in this encounter.  Meds ordered this encounter  Medications   predniSONE (DELTASONE) 10 MG tablet    Sig: Take 4 tablets (40 mg total) by mouth daily with breakfast for 1 day, THEN 3 tablets (30 mg total) daily with breakfast for 1 day, THEN 2 tablets (20 mg total) daily with breakfast for 1 day, THEN 1 tablet (10 mg total) daily with breakfast for 1 day.    Dispense:  10 tablet    Refill:  0    Return for April.  I have spent a total time of 31-minutes on the day of the appointment including chart review, data review, collecting history, coordinating care and discussing medical diagnosis and plan with the patient/family. Past medical history, allergies, medications were reviewed. Pertinent imaging, labs and tests included in this note have been reviewed and interpreted independently by me.  Tu Bayle Mechele Collin, MD [facility] 01/19/2023 12:31 PM

## 2023-01-19 NOTE — Patient Instructions (Addendum)
Moderate persistent asthma Mild restrictive defect with normal DLCO - no evidence of fibrosis on imaging --Short term prednisone taper to improve cough --CONTINUE Symbicort 160-4.5 mcg TWO puffs in the morning and evening. Rinse mouth out after use  [employer] health plan stating patient insurance approved continued coverage of non-formulary Symbicort until 10/02/2023.  --CONTINUE nebulizer as needed  RML/RLL consolidation - resolved on June CT scan --No further follow-up

## 2023-01-24 ENCOUNTER — Other Ambulatory Visit: Payer: Self-pay | Admitting: Family Medicine

## 2023-01-24 DIAGNOSIS — B9689 Other specified bacterial agents as the cause of diseases classified elsewhere: Secondary | ICD-10-CM

## 2023-02-20 ENCOUNTER — Ambulatory Visit: Payer: Self-pay | Admitting: Family Medicine

## 2023-02-20 NOTE — Telephone Encounter (Signed)
Patient states he has called in several times to try to have a prescription and pain medication called in by his PCP for diverticulitis flare up. Patient states the provider is aware of these flare ups and has called in in the past. Will send through HP message to provider.  Copied from CRM [id number]. Topic: Clinical - Red Word Triage >> Feb 20, 2023  5:02 PM Fredrica W wrote: Red Word that prompted transfer to Nurse Triage: Diverticulitis flare - abdomen swells extreme pain Reason for Disposition  Doctor (or NP/PA) call to PCP  Answer Assessment - Initial Assessment Questions 1. REASON FOR CALL or QUESTION: "What is your reason for calling today?" or "How can I best help you?" or "What question do you have that I can help answer?"     Patient states he has passed through several messages for physician to call him in a medication for diverticulitis flare up 2. CALLER: Document the source of call. (e.g., laboratory, patient).     Patient  Protocols used: PCP Call - No Triage-A-AH

## 2023-02-20 NOTE — Telephone Encounter (Signed)
Copied from CRM [id number]. Topic: Clinical - Medication Refill >> Feb 20, 2023  8:55 AM Drema Balzarine wrote: Most Recent Primary Care Visit:  Provider: Gershon Crane A  Department: [facility]  Visit Type: OFFICE VISIT  Date: 12/21/2022  Medication: Pain meds/ antibiotic for diverticulitis (says he received it in the past   Has the patient contacted their pharmacy? No (Agent: If no, request that the patient contact the pharmacy for the refill. If patient does not wish to contact the pharmacy document the reason why and proceed with request.) (Agent: If yes, when and what did the pharmacy advise?)  Is this the correct pharmacy for this prescription? Yes If no, delete pharmacy and type the correct one.  This is the patient's preferred pharmacy:    [facility] [phone] Ginette Otto, [place] - [address] [place] [address] Phone: [phone] Fax: [phone]   Has the prescription been filled recently? Yes  Is the patient out of the medication? Yes  Has the patient been seen for an appointment in the last year OR does the patient have an upcoming appointment? Yes  Can we respond through MyChart? Yes  Agent: Please be advised that Rx refills may take up to 3 business days. We ask that you follow-up with your pharmacy.

## 2023-02-21 MED ORDER — METRONIDAZOLE 500 MG PO TABS
500.0000 mg | ORAL_TABLET | Freq: Three times a day (TID) | ORAL | 0 refills | Status: DC
Start: 2023-02-21 — End: 2023-08-30

## 2023-02-21 MED ORDER — CIPROFLOXACIN HCL 500 MG PO TABS: 500.0000 mg | ORAL_TABLET | Freq: Two times a day (BID) | ORAL | 0 refills | Status: AC

## 2023-02-21 NOTE — Addendum Note (Signed)
Addended by: Gershon Crane A on: 02/21/2023 08:13 AM   Modules accepted: Orders

## 2023-02-21 NOTE — Telephone Encounter (Signed)
I sent in RX for Cipro and Metronidazole as we have before. I do not usually give "pain medication" for diverticulitis. He can take Tylenol as needed

## 2023-02-21 NOTE — Telephone Encounter (Signed)
Notified pt of Dr Clent Ridges advise, pt states that he picked up Rx and has started taking. Advised to contact the office with any concerns

## 2023-02-26 ENCOUNTER — Other Ambulatory Visit: Payer: Self-pay | Admitting: Family Medicine

## 2023-02-26 DIAGNOSIS — B9689 Other specified bacterial agents as the cause of diseases classified elsewhere: Secondary | ICD-10-CM

## 2023-03-07 ENCOUNTER — Ambulatory Visit: Payer: BC Managed Care – PPO | Admitting: Family Medicine

## 2023-03-07 ENCOUNTER — Encounter: Payer: Self-pay | Admitting: Family Medicine

## 2023-03-07 VITALS — BP 124/78 | HR 84 | Temp 98.6°F | Wt 244.0 lb

## 2023-03-07 DIAGNOSIS — N401 Enlarged prostate with lower urinary tract symptoms: Secondary | ICD-10-CM | POA: Diagnosis not present

## 2023-03-07 DIAGNOSIS — E1169 Type 2 diabetes mellitus with other specified complication: Secondary | ICD-10-CM

## 2023-03-07 DIAGNOSIS — N138 Other obstructive and reflux uropathy: Secondary | ICD-10-CM | POA: Diagnosis not present

## 2023-03-07 DIAGNOSIS — K5732 Diverticulitis of large intestine without perforation or abscess without bleeding: Secondary | ICD-10-CM

## 2023-03-07 DIAGNOSIS — E538 Deficiency of other specified B group vitamins: Secondary | ICD-10-CM | POA: Diagnosis not present

## 2023-03-07 DIAGNOSIS — E669 Obesity, unspecified: Secondary | ICD-10-CM

## 2023-03-07 DIAGNOSIS — B370 Candidal stomatitis: Secondary | ICD-10-CM

## 2023-03-07 LAB — POC URINALSYSI DIPSTICK (AUTOMATED)
Bilirubin, UA: NEGATIVE
Blood, UA: NEGATIVE
Glucose, UA: NEGATIVE
Ketones, UA: NEGATIVE
Leukocytes, UA: NEGATIVE
Nitrite, UA: NEGATIVE
Protein, UA: POSITIVE — AB
Spec Grav, UA: 1.015 (ref 1.010–1.025)
Urobilinogen, UA: 0.2 U/dL
pH, UA: 6 (ref 5.0–8.0)

## 2023-03-07 MED ORDER — NYSTATIN 100000 UNIT/ML MT SUSP: 5.0000 mL | Freq: Four times a day (QID) | OROMUCOSAL | 0 refills | Status: DC

## 2023-03-07 NOTE — Addendum Note (Signed)
Addended by: Carola Rhine on: 03/07/2023 04:21 PM   Modules accepted: Orders

## 2023-03-07 NOTE — Progress Notes (Signed)
   Subjective:    Patient ID: Thomas [name], male    DOB: 06/22/1960, 63 y.o.   MRN: [id number]  HPI Here for several issues. First he has been taking Cipro  and Metronidazole  for a presumed diverticulitis infection. He has had LLQ pain for several weeks, but he is now feeling much better. No fever or nausea. His stools have been loose. He has also had a sour vinegar-like taste in his mouth with a whitish film on the tongue.    Review of Systems  Constitutional: Negative.   HENT:  Negative for sore throat and trouble swallowing.   Respiratory: Negative.    Cardiovascular: Negative.   Gastrointestinal:  Positive for abdominal pain and diarrhea. Negative for abdominal distention, blood in stool, constipation, nausea, rectal pain and vomiting.       Objective:   Physical Exam Constitutional:      Appearance: Normal appearance.  HENT:     Mouth/Throat:     Comments: The tongue has a thin whitish film  Cardiovascular:     Rate and Rhythm: Normal rate and regular rhythm.     Pulses: Normal pulses.     Heart sounds: Normal heart sounds.  Pulmonary:     Effort: Pulmonary effort is normal.     Breath sounds: Normal breath sounds.  Abdominal:     General: Abdomen is flat. Bowel sounds are normal. There is no distension.     Palpations: Abdomen is soft. There is no mass.     Tenderness: There is no right CVA tenderness, left CVA tenderness, guarding or rebound.     Hernia: No hernia is present.     Comments: Slightly tender in the LLQ   Neurological:     Mental Status: He is alert.           Assessment & Plan:  He is recovering from a diverticulitis flare. He will finish up the antibiotics. He also has thrush, and we will treat this with Nystatin  oral suspension. Garnette Olmsted, MD

## 2023-03-08 LAB — CBC WITH DIFFERENTIAL/PLATELET
Basophils Absolute: 0.1 10*3/uL (ref 0.0–0.1)
Basophils Relative: 1.2 % (ref 0.0–3.0)
Eosinophils Absolute: 0.2 10*3/uL (ref 0.0–0.7)
Eosinophils Relative: 2.2 % (ref 0.0–5.0)
HCT: 43.3 % (ref 39.0–52.0)
Hemoglobin: 14.6 g/dL (ref 13.0–17.0)
Lymphocytes Relative: 27.9 % (ref 12.0–46.0)
Lymphs Abs: 2 10*3/uL (ref 0.7–4.0)
MCHC: 33.7 g/dL (ref 30.0–36.0)
MCV: 93.5 fL (ref 78.0–100.0)
Monocytes Absolute: 0.5 10*3/uL (ref 0.1–1.0)
Monocytes Relative: 6.8 % (ref 3.0–12.0)
Neutro Abs: 4.5 10*3/uL (ref 1.4–7.7)
Neutrophils Relative %: 61.9 % (ref 43.0–77.0)
Platelets: 349 10*3/uL (ref 150.0–400.0)
RBC: 4.64 Mil/uL (ref 4.22–5.81)
RDW: 13.4 % (ref 11.5–15.5)
WBC: 7.2 10*3/uL (ref 4.0–10.5)

## 2023-03-08 LAB — LIPID PANEL
Cholesterol: 192 mg/dL (ref 0–200)
HDL: 38 mg/dL — ABNORMAL LOW (ref >39.00)
LDL Cholesterol: 129 mg/dL — ABNORMAL HIGH (ref 0–99)
NonHDL: 154.11
Total CHOL/HDL Ratio: 5
Triglycerides: 124 mg/dL (ref 0.0–149.0)
VLDL: 24.8 mg/dL (ref 0.0–40.0)

## 2023-03-08 LAB — HEPATIC FUNCTION PANEL
ALT: 35 U/L (ref 0–53)
AST: 20 U/L (ref 0–37)
Albumin: 4.3 g/dL (ref 3.5–5.2)
Alkaline Phosphatase: 62 U/L (ref 39–117)
Bilirubin, Direct: 0.1 mg/dL (ref 0.0–0.3)
Total Bilirubin: 0.7 mg/dL (ref 0.2–1.2)
Total Protein: 6.6 g/dL (ref 6.0–8.3)

## 2023-03-08 LAB — BASIC METABOLIC PANEL
BUN: 15 mg/dL (ref 6–23)
CO2: 26 meq/L (ref 19–32)
Calcium: 9.3 mg/dL (ref 8.4–10.5)
Chloride: 102 meq/L (ref 96–112)
Creatinine, Ser: 0.96 mg/dL (ref 0.40–1.50)
GFR: 83.58 mL/min (ref >60.00)
Glucose, Bld: 94 mg/dL (ref 70–99)
Potassium: 4.3 meq/L (ref 3.5–5.1)
Sodium: 138 meq/L (ref 135–145)

## 2023-03-08 LAB — PSA: PSA: 1.11 ng/mL (ref 0.10–4.00)

## 2023-03-08 LAB — VITAMIN B12: Vitamin B-12: 601 pg/mL (ref 211–911)

## 2023-03-08 LAB — HEMOGLOBIN A1C: Hgb A1c MFr Bld: 6.4 % (ref 4.6–6.5)

## 2023-03-08 LAB — TSH: TSH: 0.65 u[IU]/mL (ref 0.35–5.50)

## 2023-03-16 ENCOUNTER — Ambulatory Visit: Payer: BC Managed Care – PPO | Admitting: Bariatrics

## 2023-03-16 ENCOUNTER — Encounter: Payer: Self-pay | Admitting: Bariatrics

## 2023-03-16 VITALS — BP 129/70 | HR 80 | Temp 98.1°F | Ht 70.0 in | Wt 240.0 lb

## 2023-03-16 DIAGNOSIS — E669 Obesity, unspecified: Secondary | ICD-10-CM

## 2023-03-16 DIAGNOSIS — Z6834 Body mass index (BMI) 34.0-34.9, adult: Secondary | ICD-10-CM

## 2023-03-16 DIAGNOSIS — I10 Essential (primary) hypertension: Secondary | ICD-10-CM

## 2023-03-16 DIAGNOSIS — E6609 Other obesity due to excess calories: Secondary | ICD-10-CM

## 2023-03-16 NOTE — Progress Notes (Addendum)
WEIGHT SUMMARY AND BIOMETRICS  Weight Lost Since Last Visit: 0  Weight Gained Since Last Visit: 0   Vitals Temp: 98.1 F (36.7 C) BP: 129/70 Pulse Rate: 80 SpO2: 97 %   Anthropometric Measurements Height: 5\' 10"  (1.778 m) Weight: 240 lb (108.9 kg) BMI (Calculated): 34.44 Weight at Last Visit: 240lb Weight Lost Since Last Visit: 0 Weight Gained Since Last Visit: 0 Starting Weight: 249lb Total Weight Loss (lbs): 9 lb (4.082 kg)   Body Composition  Body Fat %: 31.1 % Fat Mass (lbs): 74.8 lbs Muscle Mass (lbs): 157.8 lbs Total Body Water (lbs): 110.4 lbs Visceral Fat Rating : 19   Other Clinical Data Fasting: no Labs: no Today's Visit #: 18 Starting Date: 04/14/21    OBESITY Rodrick is here to discuss his progress with his obesity treatment plan along with follow-up of his obesity related diagnoses.    Nutrition Plan: low carbohydrate plan - 70% adherence.  Current exercise: walking  Interim History:  His weight remains the same.  He had an episode of diverticulitis and was treated with an antibiotic and has been eating bland foods.  He is now feeling better and wants to resume a more typical diet.   Hunger is moderately controlled.  Cravings are moderately controlled.  Assessment/Plan:   Hypertension Hypertension well controlled.  Medication(s): none  BP Readings from Last 3 Encounters:  03/16/23 129/70  03/07/23 124/78  01/19/23 136/64   Lab Results  Component Value Date   CREATININE 0.96 03/07/2023   CREATININE 0.93 12/26/2021   CREATININE 0.99 12/25/2021   Lab Results  Component Value Date   GFR 83.58 03/07/2023   GFR 70.46 02/24/2021   GFR 76.32 10/19/2020    Plan: Continue all antihypertensives at current dosages. No added salt. Will keep sodium content to 1,500 mg or less per day.   He will continue to work on his plan  and exercise. He had taken Ozempic in the past but had some GI irritation and stopped the medication.  We discussed  GLP-1's including Mounjaro which they consider in the future.    Generalized Obesity: Current BMI BMI (Calculated): 34.44    Sagan is currently in the action stage of change. As such, his goal is to continue with weight loss efforts.  He has agreed to Other given Dr. Dorina Hoyer anti-inflammatory diet pyramid and was told to stay within 1500 cal and around 90 to 100 g of protein. Marland Kitchen  Exercise goals: For substantial health benefits, adults should do at least 150 minutes (2 hours and 30 minutes) a week of moderate-intensity, or 75 minutes (1 hour and 15 minutes) a week of vigorous-intensity aerobic physical activity, or an equivalent combination of moderate- and vigorous-intensity aerobic activity. Aerobic activity should be performed in episodes of at least 10 minutes, and preferably, it should be spread throughout the week.  Behavioral modification strategies:  increasing lean protein intake, no meal skipping, meal planning , increase water intake, and keep healthy foods in the home.  He will slowly increase his fiber over time as not to aggravate his diverticulitis.   Garyson has agreed to follow-up with our clinic in 3 weeks.      Objective:   VITALS: Per patient if applicable, see vitals. GENERAL: Alert and in no acute distress. CARDIOPULMONARY: No increased WOB. Speaking in clear sentences.  PSYCH: Pleasant and cooperative. Speech normal rate and rhythm. Affect is appropriate. Insight and judgement are appropriate. Attention is focused, linear, and appropriate.  NEURO: Oriented as arrived to appointment on time with no prompting.   Attestation Statements:   This was prepared with the assistance of Engineer, civil (consulting).  Occasional wrong-word or sound-a-like substitutions may have occurred due to the inherent limitations of voice recognition   Corinna Capra, DO

## 2023-04-06 ENCOUNTER — Encounter: Payer: Self-pay | Admitting: Bariatrics

## 2023-04-06 ENCOUNTER — Ambulatory Visit: Payer: BC Managed Care – PPO | Admitting: Bariatrics

## 2023-04-06 VITALS — BP 142/88 | HR 101 | Temp 97.8°F | Ht 70.0 in | Wt 244.0 lb

## 2023-04-06 DIAGNOSIS — E669 Obesity, unspecified: Secondary | ICD-10-CM | POA: Diagnosis not present

## 2023-04-06 DIAGNOSIS — Z6835 Body mass index (BMI) 35.0-35.9, adult: Secondary | ICD-10-CM

## 2023-04-06 DIAGNOSIS — I10 Essential (primary) hypertension: Secondary | ICD-10-CM

## 2023-04-06 NOTE — Progress Notes (Signed)
 WEIGHT SUMMARY AND BIOMETRICS  Weight Lost Since Last Visit: 0lb  Weight Gained Since Last Visit: 4lb   Vitals Temp: 97.8 F (36.6 C) BP: (!) 142/88 Pulse Rate: (!) 101 SpO2: 95 %   Anthropometric Measurements Height: 5\' 10"  (1.778 m) Weight: 244 lb (110.7 kg) BMI (Calculated): 35.01 Weight at Last Visit: 240lb Weight Lost Since Last Visit: 0lb Weight Gained Since Last Visit: 4lb Starting Weight: 249lb Total Weight Loss (lbs): 5 lb (2.268 kg)   Body Composition  Body Fat %: 31.8 % Fat Mass (lbs): 77.8 lbs Muscle Mass (lbs): 158.6 lbs Total Body Water (lbs): 111.2 lbs Visceral Fat Rating : 19   Other Clinical Data Fasting: No Labs: No Today's Visit #: 83 Starting Date: 04/14/21    OBESITY Thomas [name] is here to discuss his progress with his obesity treatment plan along with follow-up of his obesity related diagnoses.    Nutrition Plan: low carbohydrate plan - 90% adherence.  Current exercise: walking  Interim History:  He is up 4 lbs. The bio-impedence scale shows that he is up 1 lb of muscle and approximately 1 lb of water, but his fat percentage is also up. He is severely stressed due to his obligations at work.  Not eating all of the food on the plan., Protein intake is as prescribed, Is not skipping meals, Not journaling consistently., and Water intake is adequate.   Hunger is moderately controlled.  Cravings are moderately controlled.  Assessment/Plan:   Hypertension Hypertension borderline controlled.  Medication(s): no medications  BP Readings from Last 3 Encounters:  04/06/23 (!) 142/88  03/16/23 129/70  03/07/23 124/78   Lab Results  Component Value Date   CREATININE 0.96 03/07/2023   CREATININE 0.93 12/26/2021   CREATININE 0.99 12/25/2021   Lab Results  Component Value Date   GFR 83.58 03/07/2023   GFR 70.46 02/24/2021    GFR 76.32 10/19/2020    Plan: Will continue to check his blood pressures at home and report to his PCP if needed.  No added salt. Will keep sodium content to 1,500 mg or less per day.   Discussed stress reduction.  Will consider an herbal cortisol support supplement.     Generalized Obesity: Current BMI BMI (Calculated): 35.01    Thomas [name] is currently in the action stage of change. As such, his goal is to continue with weight loss efforts.  He has agreed to keeping a food journal with goal of 1,400 calories and 90 to 100  grams of protein daily.  Exercise goals: All adults should avoid inactivity. Some physical activity is better than none, and adults who participate in any amount of physical activity gain some health benefits.  Behavioral modification strategies: increasing lean protein intake, no meal skipping, decrease eating out, meal planning , increase water intake, better snacking choices, and planning for success.  Thomas [name] has agreed to follow-up  with our clinic in 4 weeks.      Objective:   VITALS: Per patient if applicable, see vitals. GENERAL: Alert and in no acute distress. CARDIOPULMONARY: No increased WOB. Speaking in clear sentences.  PSYCH: Pleasant and cooperative. Speech normal rate and rhythm. Affect is appropriate. Insight and judgement are appropriate. Attention is focused, linear, and appropriate.  NEURO: Oriented as arrived to appointment on time with no prompting.   Attestation Statements:   This was prepared with the assistance of Engineer, civil (consulting).  Occasional wrong-word or sound-a-like substitutions may have occurred due to the inherent limitations of voice recognition   Corinna Capra, DO

## 2023-04-14 ENCOUNTER — Other Ambulatory Visit: Payer: Self-pay

## 2023-04-14 ENCOUNTER — Telehealth (HOSPITAL_BASED_OUTPATIENT_CLINIC_OR_DEPARTMENT_OTHER): Payer: Self-pay | Admitting: Pulmonary Disease

## 2023-04-14 DIAGNOSIS — J4540 Moderate persistent asthma, uncomplicated: Secondary | ICD-10-CM

## 2023-04-14 MED ORDER — ALBUTEROL SULFATE HFA 108 (90 BASE) MCG/ACT IN AERS
INHALATION_SPRAY | RESPIRATORY_TRACT | 0 refills | Status: DC
Start: 2023-04-14 — End: 2023-09-12

## 2023-04-14 NOTE — Telephone Encounter (Signed)
 Patient states if it is time for his rescue inhaler to be called in please do a 90 day supply not 30 days. ROV on 05/29/23  Please advise. Thanks!

## 2023-04-27 ENCOUNTER — Ambulatory Visit: Admitting: Bariatrics

## 2023-05-10 ENCOUNTER — Ambulatory Visit: Admitting: Bariatrics

## 2023-05-10 ENCOUNTER — Encounter: Payer: Self-pay | Admitting: Bariatrics

## 2023-05-10 ENCOUNTER — Ambulatory Visit: Payer: Self-pay

## 2023-05-10 VITALS — BP 148/77 | HR 73 | Temp 97.9°F | Ht 70.0 in | Wt 246.0 lb

## 2023-05-10 DIAGNOSIS — I10 Essential (primary) hypertension: Secondary | ICD-10-CM

## 2023-05-10 DIAGNOSIS — E669 Obesity, unspecified: Secondary | ICD-10-CM

## 2023-05-10 DIAGNOSIS — E66812 Obesity, class 2: Secondary | ICD-10-CM

## 2023-05-10 DIAGNOSIS — E119 Type 2 diabetes mellitus without complications: Secondary | ICD-10-CM

## 2023-05-10 DIAGNOSIS — Z7985 Long-term (current) use of injectable non-insulin antidiabetic drugs: Secondary | ICD-10-CM

## 2023-05-10 DIAGNOSIS — E1169 Type 2 diabetes mellitus with other specified complication: Secondary | ICD-10-CM

## 2023-05-10 DIAGNOSIS — Z6835 Body mass index (BMI) 35.0-35.9, adult: Secondary | ICD-10-CM

## 2023-05-10 MED ORDER — TIRZEPATIDE 2.5 MG/0.5ML ~~LOC~~ SOAJ: 2.5000 mg | SUBCUTANEOUS | 0 refills | Status: DC

## 2023-05-10 NOTE — Telephone Encounter (Signed)
 Chief Complaint: High BP Symptoms: No associated symptoms but patient states he is dealing with ear ache on left side Frequency: unsure Pertinent Negatives: Patient denies cold/flu symptoms, fever, chest pain, difficulty breathing Disposition: [] ED /[] Urgent Care (no appt availability in office) / [x] Appointment(In office/virtual)/ []  [facility]/ [] Home Care/ [] Refused Recommended Disposition /[] [facility]/ []  Follow-up with PCP Additional Notes: Patient called in stating he was at the chiropractor this morning and had two high BP readings. Patient states the first one was 152/112 and the second one was 149/80. Patient state's he's never been diagnosed with hypertension. Patient denies chest pain, difficulty breathing. Patient offered soonest available appt with HCP but states he won't see another provider outside of Dr. Clent Ridges since Dr. Clent Ridges follows his health closely. This RN booked patient for earliest available appt and put patient on the wait list. Patient specifically asks that a message be sent to Dr. Clent Ridges and his nurse in regard to if Dr. Clent Ridges can fit him in for a visit before Friday, to please contact patient directly on his mobile phone #. This RN educated patient on s/s of cardiac condition warranting 911 or ED visit.    Copied from CRM [id number]. Topic: Clinical - Red Word Triage >> May 10, 2023  1:28 PM [place] E wrote: [facility] that prompted transfer to Nurse Triage: Patient's blood pressure read 149/80 and then 152/112 at his chiropractor appt. this morning. Reason for Disposition  [1] Systolic BP  >= 130 OR Diastolic >= 80 AND [2] not taking BP medications  Answer Assessment - Initial Assessment Questions 1. BLOOD PRESSURE: "What is the blood pressure?" "Did you take at least two measurements 5 minutes apart?"     152/112 highest reading, 149/80 2. ONSET: "When did you take your blood pressure?"     This morning 3. HOW: "How did you take your blood pressure?"  (e.g., automatic home BP monitor, visiting nurse)     Automatic cuff  4. HISTORY: "Do you have a history of high blood pressure?"     No 5. MEDICINES: "Are you taking any medicines for blood pressure?" "Have you missed any doses recently?"     Not on BP medication 6. OTHER SYMPTOMS: "Do you have any symptoms?" (e.g., blurred vision, chest pain, difficulty breathing, headache, weakness)     Blurry vision, Headache/Ear ache on left side  Protocols used: Blood Pressure - High-A-AH

## 2023-05-10 NOTE — Progress Notes (Signed)
 WEIGHT SUMMARY AND BIOMETRICS  Weight Lost Since Last Visit: 0  Weight Gained Since Last Visit: 2lb   Vitals Temp: 97.9 F (36.6 C) BP: (!) 148/77 Pulse Rate: 73 SpO2: 97 %   Anthropometric Measurements Height: 5\' 10"  (1.778 m) Weight: 246 lb (111.6 kg) BMI (Calculated): 35.3 Weight at Last Visit: 244lb Weight Lost Since Last Visit: 0 Weight Gained Since Last Visit: 2lb Starting Weight: 249lb Total Weight Loss (lbs): 3 lb (1.361 kg)   Body Composition  Body Fat %: 31.3 % Fat Mass (lbs): 77 lbs Muscle Mass (lbs): 160.8 lbs Total Body Water (lbs): 113.8 lbs Visceral Fat Rating : 19   Other Clinical Data Fasting: no Labs: no Today's Visit #: 20 Starting Date: 04/14/21    OBESITY Thomas [name] is here to discuss his progress with his obesity treatment plan along with follow-up of his obesity related diagnoses.    Nutrition Plan: low carbohydrate plan - 80-85% adherence.  Current exercise:  Stretches.   Interim History:  He is up 2 lbs since his last visit. He is currently having some shoulder pain.  Eating all of the food on the plan., Protein intake is as prescribed, Is not skipping meals, Water intake is inadequate., and Reports polyphagia  Hunger is moderately controlled.  Cravings are moderately controlled.  Assessment/Plan:    Type II Diabetes HgbA1c is not at goal. Last A1c was 6.4 Episodes of hypoglycemia: no Medication(s): none.  He had tried Ozempic in the past but had been unable to tolerate secondary to GI symptoms.   Lab Results  Component Value Date   HGBA1C 6.4 03/07/2023   HGBA1C 6.3 (H) 12/24/2021   HGBA1C 5.5 09/21/2021   Lab Results  Component Value Date   LDLCALC 129 (H) 03/07/2023   CREATININE 0.96 03/07/2023   Lab Results  Component Value Date   GFR 83.58 03/07/2023   GFR 70.46 02/24/2021   GFR 76.32 10/19/2020     Plan: Start Mounjaro 2.5 mg SQ weekly.  Will start him at the lowest dose and will most likely keep him on the lowest dose for the next 3 months secondary to his history of GI issues.  Will keep all carbohydrates low both sweets and starches.  Will continue exercise regimen to 30 to 60 minutes on most days of the week.  Aim for 7 to 9 hours of sleep nightly.  Eat more low glycemic index foods.  He will increase his protein. He will weigh his protein portions.  Hypertension Hypertension borderline controlled.  His blood pressure is slightly elevated today but is probably related to his shoulder pain.  He is seeing a chiropractor for his pain and also has an appointment with his PCP on Monday. Medication(s): none.  His blood pressure has been controlled without medications in the past.  BP Readings from Last 3 Encounters:  05/10/23 (!) 148/77  04/06/23 (!) 142/88  03/16/23 129/70   Lab Results  Component Value Date   CREATININE 0.96 03/07/2023   CREATININE 0.93 12/26/2021   CREATININE 0.99 12/25/2021   Lab Results  Component Value Date   GFR 83.58 03/07/2023   GFR 70.46 02/24/2021   GFR 76.32 10/19/2020    Plan: Continue all antihypertensives at current dosages. No added salt. Will keep sodium content to 1,500 mg or less per day.     Generalized Obesity: Current BMI BMI (Calculated): 35.3   Pharmacotherapy Plan Start  Mounjaro 2.5 mg SQ weekly  Thomas [name] is currently in the action stage of change. As such, his goal is to continue with weight loss efforts.  He has agreed to low carbohydrate plan.  Exercise goals: All adults should avoid inactivity. Some physical activity is better than none, and adults who participate in any amount of physical activity gain some health benefits.  Behavioral modification strategies: increasing lean protein intake, decreasing simple carbohydrates , no meal skipping, meal planning , increase water intake, better snacking choices, planning  for success, decreasing sodium intake, and mindful eating.  Thomas [name] has agreed to follow-up with our clinic in 3 weeks.    Objective:   VITALS: Per patient if applicable, see vitals. GENERAL: Alert and in no acute distress. CARDIOPULMONARY: No increased WOB. Speaking in clear sentences.  PSYCH: Pleasant and cooperative. Speech normal rate and rhythm. Affect is appropriate. Insight and judgement are appropriate. Attention is focused, linear, and appropriate.  NEURO: Oriented as arrived to appointment on time with no prompting.   Attestation Statements:   This was prepared with the assistance of Engineer, civil (consulting).  Occasional wrong-word or sound-a-like substitutions may have occurred due to the inherent limitations of voice recognition.   Corinna Capra, DO

## 2023-05-10 NOTE — Telephone Encounter (Signed)
 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 Open up a 10:00 slot for him please

## 2023-05-10 NOTE — Telephone Encounter (Signed)
 FYI Pt has appointment scheduled for Monday 05/15/23 due to no availability.

## 2023-05-11 ENCOUNTER — Ambulatory Visit: Admitting: Family Medicine

## 2023-05-11 ENCOUNTER — Encounter: Payer: Self-pay | Admitting: Family Medicine

## 2023-05-11 ENCOUNTER — Telehealth: Payer: Self-pay

## 2023-05-11 VITALS — BP 128/80 | HR 73 | Temp 98.4°F | Wt 253.0 lb

## 2023-05-11 DIAGNOSIS — G44209 Tension-type headache, unspecified, not intractable: Secondary | ICD-10-CM | POA: Diagnosis not present

## 2023-05-11 DIAGNOSIS — M25511 Pain in right shoulder: Secondary | ICD-10-CM

## 2023-05-11 DIAGNOSIS — I10 Essential (primary) hypertension: Secondary | ICD-10-CM

## 2023-05-11 MED ORDER — CELECOXIB 200 MG PO CAPS: 200.0000 mg | ORAL_CAPSULE | Freq: Two times a day (BID) | ORAL | 2 refills | Status: DC | PRN

## 2023-05-11 NOTE — Telephone Encounter (Signed)
 Spoke with Thomas [name] advised to come to the office today as per Dr Clent Ridges, Thomas [name] scheduled for 10.15 am

## 2023-05-11 NOTE — Telephone Encounter (Signed)
 Started PA for [facility] via covermymeds.

## 2023-05-11 NOTE — Progress Notes (Signed)
   Subjective:    Patient ID: Thomas [name], male    DOB: 1960-09-02, 63 y.o.   MRN: [id number]  HPI Here for several issues. First he is concerned about his BP. While at his chiropractor yesterday his BP was found to be 148/77. He wants to follow up on this. He admits that he was in pain yesterday, and he was anxious. He feels much better today. Second he has had a sharp intermittent pain in the right shoulder for the past 3 months. No hx of trauma. No neck pain. No pain down the left arm, or numbness or tingling. Third he has had a dull constant headache on the left side of his head for the past 10 days. No skin sensitivity, no rashes, no vision changes.    Review of Systems  Constitutional: Negative.   Respiratory: Negative.    Cardiovascular: Negative.   Musculoskeletal:  Positive for arthralgias.  Neurological:  Positive for headaches. Negative for dizziness, tremors, seizures, syncope, facial asymmetry, speech difficulty, weakness, light-headedness and numbness.       Objective:   Physical Exam Constitutional:      Appearance: Normal appearance.  Cardiovascular:     Rate and Rhythm: Normal rate and regular rhythm.     Pulses: Normal pulses.     Heart sounds: Normal heart sounds.  Pulmonary:     Effort: Pulmonary effort is normal.     Breath sounds: Normal breath sounds.  Musculoskeletal:     Comments: He is tender in the anterior right shoulder over the biceps tendon. The shoulder has full ROM, no crepitus   Neurological:     General: No focal deficit present.     Mental Status: He is oriented to person, place, and time.           Assessment & Plan:  His BP is well controlled. I think he was simply anxious yesterday. The right shoulder pain is consistent with a biceps tendonitis. He will apply ice. Try Celebrex 200 mg BID as needed. Refer to Sports Medicine. Lastly he has a tension headache. The Celebrex will be helpful for this as well. Apply heat, and take  Methocarbamol as needed.  Gershon Crane, MD

## 2023-05-11 NOTE — Telephone Encounter (Signed)
 Mounjaro approved m 05/11/2023 to 05/11/2026.

## 2023-05-15 ENCOUNTER — Ambulatory Visit: Admitting: Family Medicine

## 2023-05-22 NOTE — Progress Notes (Signed)
    Thomas  D.[name] [address] [place] [address] Phone: [phone]   Assessment and Plan:     There are no diagnoses linked to this encounter.  ***   Pertinent previous records reviewed include ***    Follow Up: ***     Subjective:   I, Thomas [name], am serving as a Neurosurgeon for Doctor Thomas [name]  Chief Complaint: right shoulder pain   HPI:   05/23/2023 Patient is a 63 year old male with right shoulder pain. Patient states   Relevant Historical Information: ***  Additional pertinent review of systems negative.   Current Outpatient Medications:    albuterol  (PROVENTIL ) (2.5 MG/3ML) 0.083% nebulizer solution, Take 3 mLs (2.5 mg total) by nebulization every 4 (four) hours as needed for wheezing or shortness of breath., Disp: 75 mL, Rfl: 12   albuterol  (VENTOLIN  HFA) 108 (90 Base) MCG/ACT inhaler, INHALE 2 PUFFS EVERY 6 HOURS AS NEEDED FOR WHEEZING OR SHORTNESS OF BREATH., Disp: 54 each, Rfl: 0   budesonide -formoterol  (SYMBICORT ) 160-4.5 MCG/ACT inhaler, Inhale 2 puffs into the lungs 2 (two) times daily., Disp: 30.6 g, Rfl: 3   celecoxib  (CELEBREX ) 200 MG capsule, Take 1 capsule (200 mg total) by mouth 2 (two) times daily as needed., Disp: 60 capsule, Rfl: 2   fluticasone  (FLONASE ) 50 MCG/ACT nasal spray, SPRAY 2 SPRAYS INTO EACH NOSTRIL EVERY DAY, Disp: 48 mL, Rfl: 0   ketoconazole  (NIZORAL ) 2 % cream, Apply 1 Application topically 2 (two) times daily., Disp: 60 g, Rfl: 3   lansoprazole  (PREVACID ) 15 MG capsule, Take 1 capsule (15 mg total) by mouth daily., Disp: 90 capsule, Rfl: 3   methocarbamol  (ROBAXIN ) 500 MG tablet, Take 1 tablet (500 mg total) by mouth every 6 (six) hours as needed for muscle spasms., Disp: 60 tablet, Rfl: 5   metroNIDAZOLE  (FLAGYL ) 500 MG tablet, Take 1 tablet (500 mg total) by mouth 3 (three) times daily., Disp: 30 tablet, Rfl: 0   montelukast  (SINGULAIR ) 10 MG tablet, Take 1 tablet (10 mg  total) by mouth daily., Disp: 90 tablet, Rfl: 3   nystatin  (MYCOSTATIN ) 100000 UNIT/ML suspension, Take 5 mLs (500,000 Units total) by mouth 4 (four) times daily., Disp: 473 mL, Rfl: 0   tirzepatide (MOUNJARO) 2.5 MG/0.5ML Pen, Inject 2.5 mg into the skin once a week., Disp: 6 mL, Rfl: 0   triamcinolone  cream (KENALOG ) 0.1 %, Apply 1 Application topically 2 (two) times daily as needed (rash)., Disp: 45 g, Rfl: 2   Vitamin D , Ergocalciferol , (DRISDOL ) 1.25 MG (50000 UNIT) CAPS capsule, TAKE 1 CAPSULE (50,000 UNITS TOTAL) BY MOUTH EVERY 14 DAYS, Disp: 6 capsule, Rfl: 0   Objective:     There were no vitals filed for this visit.    There is no height or weight on file to calculate BMI.    Physical Exam:    ***   Electronically signed by:  Marshall Skeeter D.[name] 12:58 PM 05/22/23

## 2023-05-23 ENCOUNTER — Ambulatory Visit (INDEPENDENT_AMBULATORY_CARE_PROVIDER_SITE_OTHER)

## 2023-05-23 ENCOUNTER — Ambulatory Visit: Admitting: Sports Medicine

## 2023-05-23 VITALS — BP 136/84 | HR 75 | Ht 70.0 in | Wt 255.0 lb

## 2023-05-23 DIAGNOSIS — M25511 Pain in right shoulder: Secondary | ICD-10-CM | POA: Diagnosis not present

## 2023-05-23 NOTE — Patient Instructions (Signed)
 Pt referral  Shoulder HEP 4 week follow up

## 2023-05-24 ENCOUNTER — Encounter: Payer: Self-pay | Admitting: Sports Medicine

## 2023-05-29 ENCOUNTER — Encounter: Payer: Self-pay | Admitting: Nurse Practitioner

## 2023-05-29 ENCOUNTER — Ambulatory Visit (INDEPENDENT_AMBULATORY_CARE_PROVIDER_SITE_OTHER)

## 2023-05-29 ENCOUNTER — Ambulatory Visit (HOSPITAL_BASED_OUTPATIENT_CLINIC_OR_DEPARTMENT_OTHER): Admitting: Pulmonary Disease

## 2023-05-29 ENCOUNTER — Ambulatory Visit: Admitting: Nurse Practitioner

## 2023-05-29 VITALS — BP 134/88 | HR 75 | Ht 71.0 in | Wt 252.0 lb

## 2023-05-29 DIAGNOSIS — J45901 Unspecified asthma with (acute) exacerbation: Secondary | ICD-10-CM | POA: Diagnosis not present

## 2023-05-29 DIAGNOSIS — J069 Acute upper respiratory infection, unspecified: Secondary | ICD-10-CM

## 2023-05-29 DIAGNOSIS — R058 Other specified cough: Secondary | ICD-10-CM | POA: Diagnosis not present

## 2023-05-29 DIAGNOSIS — J4540 Moderate persistent asthma, uncomplicated: Secondary | ICD-10-CM

## 2023-05-29 LAB — POCT EXHALED NITRIC OXIDE: FeNO level (ppb): 16

## 2023-05-29 MED ORDER — PREDNISONE 20 MG PO TABS: 40.0000 mg | ORAL_TABLET | Freq: Every day | ORAL | 0 refills | Status: AC

## 2023-05-29 MED ORDER — HYDROCODONE BIT-HOMATROP MBR 5-1.5 MG/5ML PO SOLN: 5.0000 mL | Freq: Four times a day (QID) | ORAL | 0 refills | Status: AC | PRN

## 2023-05-29 MED ORDER — BENZONATATE 200 MG PO CAPS: 200.0000 mg | ORAL_CAPSULE | Freq: Three times a day (TID) | ORAL | 1 refills | Status: AC | PRN

## 2023-05-29 NOTE — Patient Instructions (Addendum)
 Continue symbicort  2 puffs Twice daily. Brush tongue and rinse mouth afterwards Continue Albuterol  inhaler 2 puffs or 3 mL neb every 6 hours as needed for shortness of breath or wheezing. Notify if symptoms persist despite rescue inhaler/neb use.  Continue flonase  nasal spray 2 sprays each nostril daily Continue singulair  1 tab At bedtime   Prednisone  40 mg daily for 5 days. Take in AM with food Hycodan cough syrup 5 mL every 6 hours as needed for cough. Do not drive after taking. Benzonatate  1 capsule Three times a day for cough Afrin nasal spray over the counter 1-2 times a day. Do not use longer than 3-5 days Mucinex  or Mucinex  DM 600 mg Twice daily for cough/congestion over the counter  Chest x ray today  Upper airway cough syndrome: Suppress your cough to allow your larynx (voice box) to heal.  Limit talking for the next few days. Avoid throat clearing. Work on cough suppression with the above recommended suppressants.  Use sugar free hard candies or non-menthol  cough drops during this time to soothe your throat.  Warm tea with honey and lemon.   Let me know if your COVID test is positive   Follow up in 2-3 weeks with Thomas Deserae Jennings,NP to see how you are feeling. If symptoms do not improve or worsen, please contact office for sooner follow up or seek emergency care.

## 2023-05-29 NOTE — Progress Notes (Unsigned)
 @Patient  ID: Thomas [name], male    DOB: 02/18/1960, 63 y.o.   MRN: [id number]  Chief Complaint  Patient presents with   Follow-up    Moderate persistent asthma. Patient states he is having breathing issues,coughing of phlegm and coughing spasm. History of pneumonia (2023)Started yesterday.    Referring provider: Donley Furth, MD  HPI: 63 year old male, former smoker followed for asthma and history of recurrent pneumonias. He is a patient of Dr. Jacqualyn Mates and last seen in office 01/19/2023. Past medical history significant for DM, chronic sinusitis, nasal polyposis, GERD, HTN, DM, obesity, HLD.  TEST/EVENTS:  12/28/2022 bronchoscopy for RML/RLL consolidation: cultures and cytology negative   01/19/2023: OV with Dr. Washington Hacker. URI in November - seen by PCP. Z pack did not improve so given flagyl . No longer coughing up sputum. Using neb 2-3 times a week. Compliant with Symbicort . Having some palpitations. Unable to tolerate Breo due to powder. Does well with Symbicort . Improving symptoms. Short term prednisone  taper.   05/29/2023: Today - acute Discussed the use of AI scribe software for clinical note transcription with the patient, who gave verbal consent to proceed.  History of Present Illness   Thomas [name] is a 63 year old male with asthma who presents with worsening cough and chest discomfort.  He has been experiencing a persistent cough and associated chest discomfort since Thursday, which worsened by Sunday. The discomfort is accompanied by muscle spasms and is reminiscent of a previous pneumonia episode. The pain occurs with the coughing. He has a low-grade fever of approximately 72F, but no temperatures above 100.65F. He has noticed some pink tinge in his sputum but no bright red blood. Otherwise, he's having some greenish sinus discharge which he occasionally coughs up, along with a sore throat. He feels like he has a lot of chest congestion. Feeling slightly more short winded.  Just feels very run down. He has not viral tested.   He has been using his rescue inhaler daily since Thursday, a couple of times a day, and continues to use Symbicort  regularly. He has also taken Alka seltzer for his cough. He experiences some wheezing. He feels hoarse and notes that his cough is triggered by dry air, talking, and drainage.  He has been using Flonase  and singulair .   He has been working outside a lot recently, which may have contributed to his symptoms.   FeNO 16 ppb       Allergies  Allergen Reactions   No Known Allergies     Immunization History  Administered Date(s) Administered   Fluad Quad(high Dose 65+) 01/03/2019, 12/04/2019, 12/29/2020   Influenza, High Dose Seasonal PF 03/14/2018   PFIZER Comirnaty(Gray Top)Covid-19 Tri-Sucrose Vaccine 05/25/2020   PFIZER(Purple Top)SARS-COV-2 Vaccination 03/12/2019, 04/06/2019, 01/14/2020   Tdap 08/21/2014    Past Medical History:  Diagnosis Date   Allergy    Asthma    Back pain    COVID-18 Nov 2021   Diabetes (HCC)    GERD (gastroesophageal reflux disease)    History of hiatal hernia    Nasal polyposis    Pneumonia    5 -7 yrs ago   Swallowing difficulty     Tobacco History: Social History   Tobacco Use  Smoking Status Former   Current packs/day: 0.00   Average packs/day: 1 pack/day for 10.0 years (10.0 ttl pk-yrs)   Types: Cigarettes   Start date: 02/01/1976   Quit date: 01/31/1986   Years since quitting: 37.3  Smokeless Tobacco Never   Counseling given: Not Answered   Outpatient Medications Prior to Visit  Medication Sig Dispense Refill   albuterol  (PROVENTIL ) (2.5 MG/3ML) 0.083% nebulizer solution Take 3 mLs (2.5 mg total) by nebulization every 4 (four) hours as needed for wheezing or shortness of breath. 75 mL 12   albuterol  (VENTOLIN  HFA) 108 (90 Base) MCG/ACT inhaler INHALE 2 PUFFS EVERY 6 HOURS AS NEEDED FOR WHEEZING OR SHORTNESS OF BREATH. 54 each 0   budesonide -formoterol  (SYMBICORT )  160-4.5 MCG/ACT inhaler Inhale 2 puffs into the lungs 2 (two) times daily. 30.6 g 3   celecoxib  (CELEBREX ) 200 MG capsule Take 1 capsule (200 mg total) by mouth 2 (two) times daily as needed. 60 capsule 2   fluticasone  (FLONASE ) 50 MCG/ACT nasal spray SPRAY 2 SPRAYS INTO EACH NOSTRIL EVERY DAY 48 mL 0   ketoconazole  (NIZORAL ) 2 % cream Apply 1 Application topically 2 (two) times daily. 60 g 3   lansoprazole  (PREVACID ) 15 MG capsule Take 1 capsule (15 mg total) by mouth daily. 90 capsule 3   methocarbamol  (ROBAXIN ) 500 MG tablet Take 1 tablet (500 mg total) by mouth every 6 (six) hours as needed for muscle spasms. 60 tablet 5   metroNIDAZOLE  (FLAGYL ) 500 MG tablet Take 1 tablet (500 mg total) by mouth 3 (three) times daily. 30 tablet 0   montelukast  (SINGULAIR ) 10 MG tablet Take 1 tablet (10 mg total) by mouth daily. 90 tablet 3   nystatin  (MYCOSTATIN ) 100000 UNIT/ML suspension Take 5 mLs (500,000 Units total) by mouth 4 (four) times daily. 473 mL 0   tirzepatide (MOUNJARO) 2.5 MG/0.5ML Pen Inject 2.5 mg into the skin once a week. 6 mL 0   triamcinolone  cream (KENALOG ) 0.1 % Apply 1 Application topically 2 (two) times daily as needed (rash). 45 g 2   Vitamin D , Ergocalciferol , (DRISDOL ) 1.25 MG (50000 UNIT) CAPS capsule TAKE 1 CAPSULE (50,000 UNITS TOTAL) BY MOUTH EVERY 14 DAYS 6 capsule 0   No facility-administered medications prior to visit.     Review of Systems:   Constitutional: No weight loss or gain, night sweats, fevers, chills +fatigue, or lassitude. HEENT: No headaches, difficulty swallowing, tooth/dental problems. No sneezing, itching, ear ache  +nasal congestion, post nasal drip, nasal drainage, sore throat CV:  No chest pain, orthopnea, PND, swelling in lower extremities, anasarca, dizziness, palpitations, syncope Resp: +shortness of breath with exertion; productive cough; wheezing. No hemoptysis. No wheezing.  No chest wall deformity GI:  No heartburn, indigestion, abdominal pain,  nausea, vomiting, diarrhea, change in bowel habits, loss of appetite, bloody stools.  GU: No dysuria, change in color of urine, urgency or frequency.  No flank pain, no hematuria  Skin: No rash, lesions, ulcerations MSK:  No joint pain or swelling.+chest wall pain  Neuro: No dizziness or lightheadedness.  Psych: No depression or anxiety. Mood stable.     Physical Exam:  BP 134/88 (BP Location: Left Arm, Patient Position: Sitting, Cuff Size: Normal)   Pulse 75   Ht 5\' 11"  (1.803 m)   Wt 252 lb (114.3 kg)   SpO2 96%   BMI 35.15 kg/m   GEN: Pleasant, interactive, well-kempt; ill-appearing; in no acute distress HEENT:  Normocephalic and atraumatic. EACs patent bilaterally. TM pearly gray with present light reflex bilaterally. PERRLA. Sclera white. Nasal turbinates erythematous, moist and patent bilaterally. White rhinorrhea present. Oropharynx erythematous and moist, without exudate or edema. No lesions, ulcerations NECK:  Supple w/ fair ROM. No JVD present. Normal carotid impulses w/o bruits.  Thyroid  symmetrical with no goiter or nodules palpated. Submandibular/cervical lymphadenopathy.   CV: RRR, no m/r/g, no peripheral edema. Pulses intact, +2 bilaterally. No cyanosis, pallor or clubbing. PULMONARY:  Unlabored, regular breathing. Clear bilaterally A&P w/o wheezes/rales/rhonchi. Congested cough. No accessory muscle use.  GI: BS present and normoactive. Soft, non-tender to palpation. No organomegaly or masses detected. No CVA tenderness. MSK: No erythema, warmth. Cap refil <2 sec all extrem. No deformities or joint swelling noted. Tenderness to chest wall upon palpation  Neuro: A/Ox3. No focal deficits noted.   Skin: Warm, no lesions or rashe Psych: Normal affect and behavior. Judgement and thought content appropriate.     Lab Results:  CBC    Component Value Date/Time   WBC 7.2 03/07/2023 1523   RBC 4.64 03/07/2023 1523   HGB 14.6 03/07/2023 1523   HGB 16.0 10/23/2017 0953    HCT 43.3 03/07/2023 1523   HCT 42.6 10/23/2017 0953   PLT 349.0 03/07/2023 1523   MCV 93.5 03/07/2023 1523   MCV 88 10/23/2017 0953   MCH 31.9 12/26/2021 0350   MCHC 33.7 03/07/2023 1523   RDW 13.4 03/07/2023 1523   RDW 13.4 10/23/2017 0953   LYMPHSABS 2.0 03/07/2023 1523   LYMPHSABS 2.2 10/23/2017 0953   MONOABS 0.5 03/07/2023 1523   EOSABS 0.2 03/07/2023 1523   EOSABS 0.5 (H) 10/23/2017 0953   BASOSABS 0.1 03/07/2023 1523   BASOSABS 0.0 10/23/2017 0953    BMET    Component Value Date/Time   NA 138 03/07/2023 1523   NA 141 09/21/2021 1235   K 4.3 03/07/2023 1523   CL 102 03/07/2023 1523   CO2 26 03/07/2023 1523   GLUCOSE 94 03/07/2023 1523   BUN 15 03/07/2023 1523   BUN 9 09/21/2021 1235   CREATININE 0.96 03/07/2023 1523   CREATININE 1.05 04/24/2015 1428   CALCIUM 9.3 03/07/2023 1523   GFRNONAA >60 12/26/2021 0350   GFRNONAA 79 04/24/2015 1428   GFRAA 95 02/12/2018 1328   GFRAA >89 04/24/2015 1428    BNP No results found for: "BNP"   Imaging:  DG Shoulder Right Result Date: 05/23/2023 EXAM: 1 VIEW(S) XRAY OF THE RIGHT SHOULDER 05/23/2023 02:09:54 PM COMPARISON: None available. CLINICAL HISTORY: Right shoulder pain x 1 month+. NKI. FINDINGS: BONES AND JOINTS: Glenohumeral joint is normally aligned. No evidence of acute fracture or dislocation. The [facility] joint is unremarkable in appearance. SOFT TISSUES: No abnormal calcifications. Visualized lung is unremarkable. IMPRESSION: 1. No significant abnormality. Electronically signed by: Zadie Herter MD 05/23/2023 09:33 PM EDT RP Workstation: [id number]    Administration History     None          Latest Ref Rng & Units 01/04/2018   12:48 PM  PFT Results  FVC-Pre L 5.13   FVC-Predicted Pre % 122   FVC-Post L 5.11   FVC-Predicted Post % 122   Pre FEV1/FVC % % 85   Post FEV1/FCV % % 86   FEV1-Pre L 4.34   FEV1-Predicted Pre % 132   FEV1-Post L 4.38   DLCO uncorrected ml/min/mmHg 38.46   DLCO UNC% % 114    DLVA Predicted % 114   TLC L 4.75   TLC % Predicted % 66   RV % Predicted % -17     Lab Results  Component Value Date   NITRICOXIDE 217 11/27/2017        Assessment & Plan:      Asthma  Asthma with possible exacerbation triggered by upper respiratory infection or  allergies. No significant airway inflammation with normal FeNO testing. Lung exam clear. Utilizing SABA often with reflief. Will treat him with prednisone  burst and supportive care measures. Suspect large component of his cough is related to upper airway irritation/inflammation. He does also seem to have related costochondritis, which the oral steroids should treat. Symbicort  use confirmed. Coughing causing muscle spasms and discomfort. Cough control regimen. Action plan in place. CXR to rule out secondary bacterial infection.  - Prescribe prednisone  40 mg daily for 5 days - Prescribe hycodan cough syrup as needed for cough. Side effect profile reviewed.  - Prescribe Tessalon  Perles (benzonatate ) every 8 hours for cough suppression. - Continue Symbicort  and rescue inhaler as needed  Acute viral upper respiratory infection Symptoms include cough, sore throat, sinus drainage. Cough likely due to upper airway irritation and drainage. Differential includes viral infection or allergies. Unable to COVID test in office. Advised to test at home and notify if positive. Chest x-ray to rule out superimposed infection. Hold on antibiotics at this time due to likely viral or allergic etiology and clear lung exam. Discussed that antibiotics are not recommended unless symptoms worsen or fail to improve. Viral symptoms usually improve over 7-10 days. Continue allergy regimen.  - Order chest x-ray to rule out pneumonia. - Advise use of Flonase  and Afrin nasal spray for sinus congestion. - Recommend Mucinex  (guaifenesin ) - Advise COVID-19 test at home and report if positive. - Advise to call if symptoms worsen or do not improve for  consideration of antibiotics.      Follow up in 2-3 weeks   Advised if symptoms do not improve or worsen, to please contact office for sooner follow up or seek emergency care.   I spent 35 minutes of dedicated to the care of this patient on the date of this encounter to include pre-visit review of records, face-to-face time with the patient discussing conditions above, post visit ordering of testing, clinical documentation with the electronic health record, making appropriate referrals as documented, and communicating necessary findings to members of the patients care team.  Roetta Clarke, NP 05/29/2023  Pt aware and understands NP's role.

## 2023-05-31 ENCOUNTER — Other Ambulatory Visit: Payer: Self-pay | Admitting: Bariatrics

## 2023-05-31 ENCOUNTER — Encounter: Payer: Self-pay | Admitting: Nurse Practitioner

## 2023-05-31 DIAGNOSIS — E559 Vitamin D deficiency, unspecified: Secondary | ICD-10-CM

## 2023-06-01 ENCOUNTER — Telehealth: Payer: Self-pay

## 2023-06-01 ENCOUNTER — Ambulatory Visit: Admitting: Bariatrics

## 2023-06-01 NOTE — Telephone Encounter (Signed)
 Copied from CRM [id number]. Topic: Clinical - Lab/Test Results >> May 30, 2023 12:36 PM [name] wrote: Reason for CRM:  Patient saw [name] on yesterday. She recommended he take a COVID test at home and let her know the results. He did test positive.  FYI Thomas [name]

## 2023-06-01 NOTE — Telephone Encounter (Signed)
 Symptom onset was last Thursday. He's on day 7 now so no indication for antiviral. Continue supportive care measures and complete prednisone  as prescribed. If symptoms worsen, go to the ED. Keep scheduled follow up with us .

## 2023-06-01 NOTE — Telephone Encounter (Signed)
 Spoke with Thomas [name]. Informed of Thomas [name]'s note. Pt verbalized understanding & had no concerns. Pt is not scheduled for follow up visit. I offered pt option to schedule appt with Thomas [name] and pt declined as he would like to see Thomas [name] when she returns. NFN

## 2023-06-08 ENCOUNTER — Ambulatory Visit: Admitting: Nurse Practitioner

## 2023-06-08 ENCOUNTER — Encounter: Payer: Self-pay | Admitting: Nurse Practitioner

## 2023-06-08 VITALS — BP 130/82 | HR 77 | Ht 71.0 in | Wt 245.8 lb

## 2023-06-08 DIAGNOSIS — J069 Acute upper respiratory infection, unspecified: Secondary | ICD-10-CM

## 2023-06-08 DIAGNOSIS — J4540 Moderate persistent asthma, uncomplicated: Secondary | ICD-10-CM | POA: Diagnosis not present

## 2023-06-08 DIAGNOSIS — U071 COVID-19: Secondary | ICD-10-CM

## 2023-06-08 MED ORDER — AZITHROMYCIN 250 MG PO TABS: ORAL_TABLET | ORAL | 0 refills | Status: DC

## 2023-06-08 MED ORDER — PREDNISONE 10 MG PO TABS: ORAL_TABLET | ORAL | 0 refills | Status: DC

## 2023-06-08 NOTE — Progress Notes (Signed)
 @Patient  ID: Thomas [name], male    DOB: Sep 22, 1960, 63 y.o.   MRN: [id number]  Chief Complaint  Patient presents with   Follow-up    Follow up on asthma and sob on activity.    Referring provider: Donley Furth, MD  HPI: 63 year old male, former smoker followed for asthma and history of recurrent pneumonias. He is a patient of Dr. Jacqualyn Mates and last seen in office 05/29/2023 by [facility] NP. Past medical history significant for DM, chronic sinusitis, nasal polyposis, GERD, HTN, DM, obesity, HLD.  TEST/EVENTS:  01/04/2018 PFT: FVC 122, FEV1 132, ratio 86, TLC 66, DLCO 114.  No BD 07/11/2022 CT chest: Small hiatal hernia.  Central airways are patent.  Near complete interval resolution of bilateral consolidations with minimal residual contrast opacities in bilateral upper lobes.  Stable 3 mm pulmonary nodule in right upper lobe. 12/28/2022 bronchoscopy for RML/RLL consolidation: cultures and cytology negative  05/29/2023 CXR: Lungs are clear  01/19/2023: OV with Dr. Washington Hacker. URI in November - seen by PCP. Z pack did not improve so given flagyl . No longer coughing up sputum. Using neb 2-3 times a week. Compliant with Symbicort . Having some palpitations. Unable to tolerate Breo due to powder. Does well with Symbicort . Improving symptoms. Short term prednisone  taper.   05/29/2023: Margarito Shearing with Draco Malczewski NP Discussed the use of AI scribe software for clinical note transcription with the patient, who gave verbal consent to proceed. [name] is a 63 year old male with asthma who presents with worsening cough and chest discomfort. He has been experiencing a persistent cough and associated chest discomfort since Thursday, which worsened by Sunday. The discomfort is accompanied by muscle spasms and is reminiscent of a previous pneumonia episode. The pain occurs with the coughing. He has a low-grade fever of approximately 22F, but no temperatures above 100.43F. He has noticed some pink tinge in his sputum but  no bright red blood. Otherwise, he's having some greenish sinus discharge which he occasionally coughs up, along with a sore throat. He feels like he has a lot of chest congestion. Feeling slightly more short winded. Just feels very run down. He has not viral tested.  He has been using his rescue inhaler daily since Thursday, a couple of times a day, and continues to use Symbicort  regularly. He has also taken Alka seltzer for his cough. He experiences some wheezing. He feels hoarse and notes that his cough is triggered by dry air, talking, and drainage. He has been using Flonase  and singulair .  He has been working outside a lot recently, which may have contributed to his symptoms.  FeNO 16 ppb     06/08/2023: Today - follow up Patient presents today for intended follow-up.  He was treated a week and a half ago for possible asthma exacerbation secondary to viral illness.  Subsequently tested positive for COVID; he was out of the 5-day window for antiviral treatment upon notification of test results.  He did feel like he was feeling better after prednisone  course.  No longer having significant coughing spells and not really having any of the spasms that he was previously.  Not have any more pink-tinged to his cough.  Still occasionally producing some white phlegm.  He has started to feel little more short of breath over the last couple of days.  Felt like he was doing better when he was on the prednisone .  Not have any issues with fevers, chills, hemoptysis, leg swelling, calf pain.  No  headaches or sore throat anymore.  Still feeling fatigued and like energy levels are quite back to his normal.  Has not been having to use his rescue inhaler as often.  Still using Symbicort  regularly.  Has not had to use cough syrups.  Allergies  Allergen Reactions   No Known Allergies     Immunization History  Administered Date(s) Administered   Fluad Quad(high Dose 65+) 01/03/2019, 12/04/2019, 12/29/2020   Influenza,  High Dose Seasonal PF 03/14/2018   PFIZER Comirnaty(Gray Top)Covid-19 Tri-Sucrose Vaccine 05/25/2020   PFIZER(Purple Top)SARS-COV-2 Vaccination 03/12/2019, 04/06/2019, 01/14/2020   Tdap 08/21/2014    Past Medical History:  Diagnosis Date   Allergy    Asthma    Back pain    COVID-18 Nov 2021   Diabetes (HCC)    GERD (gastroesophageal reflux disease)    History of hiatal hernia    Nasal polyposis    Pneumonia    5 -7 yrs ago   Swallowing difficulty     Tobacco History: Social History   Tobacco Use  Smoking Status Former   Current packs/day: 0.00   Average packs/day: 1 pack/day for 10.0 years (10.0 ttl pk-yrs)   Types: Cigarettes   Start date: 02/01/1976   Quit date: 01/31/1986   Years since quitting: 37.3  Smokeless Tobacco Never   Counseling given: Not Answered   Outpatient Medications Prior to Visit  Medication Sig Dispense Refill   albuterol  (PROVENTIL ) (2.5 MG/3ML) 0.083% nebulizer solution Take 3 mLs (2.5 mg total) by nebulization every 4 (four) hours as needed for wheezing or shortness of breath. 75 mL 12   albuterol  (VENTOLIN  HFA) 108 (90 Base) MCG/ACT inhaler INHALE 2 PUFFS EVERY 6 HOURS AS NEEDED FOR WHEEZING OR SHORTNESS OF BREATH. 54 each 0   benzonatate  (TESSALON ) 200 MG capsule Take 1 capsule (200 mg total) by mouth 3 (three) times daily as needed for cough. 30 capsule 1   budesonide -formoterol  (SYMBICORT ) 160-4.5 MCG/ACT inhaler Inhale 2 puffs into the lungs 2 (two) times daily. 30.6 g 3   celecoxib  (CELEBREX ) 200 MG capsule Take 1 capsule (200 mg total) by mouth 2 (two) times daily as needed. 60 capsule 2   fluticasone  (FLONASE ) 50 MCG/ACT nasal spray SPRAY 2 SPRAYS INTO EACH NOSTRIL EVERY DAY 48 mL 0   HYDROcodone  bit-homatropine (HYCODAN) 5-1.5 MG/5ML syrup Take 5 mLs by mouth every 6 (six) hours as needed for cough. 240 mL 0   ketoconazole  (NIZORAL ) 2 % cream Apply 1 Application topically 2 (two) times daily. 60 g 3   lansoprazole  (PREVACID ) 15 MG  capsule Take 1 capsule (15 mg total) by mouth daily. 90 capsule 3   methocarbamol  (ROBAXIN ) 500 MG tablet Take 1 tablet (500 mg total) by mouth every 6 (six) hours as needed for muscle spasms. 60 tablet 5   metroNIDAZOLE  (FLAGYL ) 500 MG tablet Take 1 tablet (500 mg total) by mouth 3 (three) times daily. 30 tablet 0   montelukast  (SINGULAIR ) 10 MG tablet Take 1 tablet (10 mg total) by mouth daily. 90 tablet 3   nystatin  (MYCOSTATIN ) 100000 UNIT/ML suspension Take 5 mLs (500,000 Units total) by mouth 4 (four) times daily. 473 mL 0   tirzepatide (MOUNJARO) 2.5 MG/0.5ML Pen Inject 2.5 mg into the skin once a week. 6 mL 0   triamcinolone  cream (KENALOG ) 0.1 % Apply 1 Application topically 2 (two) times daily as needed (rash). 45 g 2   Vitamin D , Ergocalciferol , (DRISDOL ) 1.25 MG (50000 UNIT) CAPS capsule TAKE 1 CAPSULE (  50,000 UNITS TOTAL) BY MOUTH EVERY 14 DAYS 6 capsule 0   No facility-administered medications prior to visit.     Review of Systems:   Constitutional: No weight loss or gain, night sweats, fevers, chills +fatigue, lassitude (slow to improve) HEENT: No headaches, difficulty swallowing, tooth/dental problems. No sneezing, itching, ear ache, nasal congestion, post nasal drip, nasal drainage, sore throat CV:  No chest pain, orthopnea, PND, swelling in lower extremities, anasarca, dizziness, palpitations, syncope Resp: +shortness of breath with exertion; productive cough; minimal wheezing. No hemoptysis. No wheezing.  No chest wall deformity GI:  No heartburn, indigestion, abdominal pain, nausea, vomiting, diarrhea, change in bowel habits, loss of appetite, bloody stools.  GU: No dysuria, change in color of urine, urgency or frequency.  No flank pain, no hematuria  Skin: No rash, lesions, ulcerations MSK:  No joint pain or swelling. Neuro: No dizziness or lightheadedness.  Psych: No depression or anxiety. Mood stable.     Physical Exam:  BP 130/82 (BP Location: Left Arm, Patient  Position: Sitting, Cuff Size: Large)   Pulse 77   Ht 5\' 11"  (1.803 m)   Wt 245 lb 12.8 oz (111.5 kg)   SpO2 100%   BMI 34.28 kg/m   GEN: Pleasant, interactive, well-kempt; ill-appearing; in no acute distress HEENT:  Normocephalic and atraumatic. EACs patent bilaterally. TM pearly gray with present light reflex bilaterally. PERRLA. Sclera white. Nasal turbinates erythematous, moist and patent bilaterally. No rhinorrhea present. Oropharynx pink and moist, without exudate or edema. No lesions, ulcerations NECK:  Supple w/ fair ROM. No JVD present. Normal carotid impulses w/o bruits. Thyroid  symmetrical with no goiter or nodules palpated. No lymphadenopathy.   CV: RRR, no m/r/g, no peripheral edema. Pulses intact, +2 bilaterally. No cyanosis, pallor or clubbing. PULMONARY:  Unlabored, regular breathing. Clear bilaterally A&P w/o wheezes/rales/rhonchi. No accessory muscle use.  GI: BS present and normoactive. Soft, non-tender to palpation. No organomegaly or masses detected.  MSK: No erythema, warmth. Cap refil <2 sec all extrem. No deformities or joint swelling noted.  Neuro: A/Ox3. No focal deficits noted.   Skin: Warm, no lesions or rashe Psych: Normal affect and behavior. Judgement and thought content appropriate.     Lab Results:  CBC    Component Value Date/Time   WBC 7.2 03/07/2023 1523   RBC 4.64 03/07/2023 1523   HGB 14.6 03/07/2023 1523   HGB 16.0 10/23/2017 0953   HCT 43.3 03/07/2023 1523   HCT 42.6 10/23/2017 0953   PLT 349.0 03/07/2023 1523   MCV 93.5 03/07/2023 1523   MCV 88 10/23/2017 0953   MCH 31.9 12/26/2021 0350   MCHC 33.7 03/07/2023 1523   RDW 13.4 03/07/2023 1523   RDW 13.4 10/23/2017 0953   LYMPHSABS 2.0 03/07/2023 1523   LYMPHSABS 2.2 10/23/2017 0953   MONOABS 0.5 03/07/2023 1523   EOSABS 0.2 03/07/2023 1523   EOSABS 0.5 (H) 10/23/2017 0953   BASOSABS 0.1 03/07/2023 1523   BASOSABS 0.0 10/23/2017 0953    BMET    Component Value Date/Time   NA 138  03/07/2023 1523   NA 141 09/21/2021 1235   K 4.3 03/07/2023 1523   CL 102 03/07/2023 1523   CO2 26 03/07/2023 1523   GLUCOSE 94 03/07/2023 1523   BUN 15 03/07/2023 1523   BUN 9 09/21/2021 1235   CREATININE 0.96 03/07/2023 1523   CREATININE 1.05 04/24/2015 1428   CALCIUM 9.3 03/07/2023 1523   GFRNONAA >60 12/26/2021 0350   GFRNONAA 79 04/24/2015 1428  GFRAA 95 02/12/2018 1328   GFRAA >89 04/24/2015 1428    BNP No results found for: "BNP"   Imaging:  DG Chest 2 View Result Date: 05/29/2023 CLINICAL DATA:  cough; hx of pna EXAM: CHEST - 2 VIEW COMPARISON:  December 27, 2021, May 23, 2023 FINDINGS: No focal airspace consolidation, pleural effusion, or pneumothorax. No cardiomegaly. No acute fracture or destructive lesion. Anterior cervical fusion hardware. Multilevel degenerative disc disease of the spine. IMPRESSION: No acute cardiopulmonary abnormality. Electronically Signed   By: Rance Burrows M.D.   On: 05/29/2023 16:40   DG Shoulder Right Result Date: 05/23/2023 EXAM: 1 VIEW(S) XRAY OF THE RIGHT SHOULDER 05/23/2023 02:09:54 PM COMPARISON: None available. CLINICAL HISTORY: Right shoulder pain x 1 month+. NKI. FINDINGS: BONES AND JOINTS: Glenohumeral joint is normally aligned. No evidence of acute fracture or dislocation. The [facility] joint is unremarkable in appearance. SOFT TISSUES: No abnormal calcifications. Visualized lung is unremarkable. IMPRESSION: 1. No significant abnormality. Electronically signed by: Zadie Herter MD 05/23/2023 09:33 PM EDT RP Workstation: [id number]    Administration History     None          Latest Ref Rng & Units 01/04/2018   12:48 PM  PFT Results  FVC-Pre L 5.13   FVC-Predicted Pre % 122   FVC-Post L 5.11   FVC-Predicted Post % 122   Pre FEV1/FVC % % 85   Post FEV1/FCV % % 86   FEV1-Pre L 4.34   FEV1-Predicted Pre % 132   FEV1-Post L 4.38   DLCO uncorrected ml/min/mmHg 38.46   DLCO UNC% % 114   DLVA Predicted % 114   TLC L 4.75    TLC % Predicted % 66   RV % Predicted % -17     Lab Results  Component Value Date   NITRICOXIDE 217 11/27/2017        Assessment & Plan:      Asthma Recent COVID illness with mild asthma exacerbation. Overall improving with residual DOE, cough and fatigue. Slight worsening with discontinuation of prednisone . Lung exam is clear today but given current symptoms and response to steroids, will challenge him with prednisone  taper. Advised that if he fails to improve or worsens over the next 48 hours, to start empiric azithromycin . Continue bronchodilator regimen. Recent CXR clear. Advised to continue supportive care measures. Action plan in place. If he develops worsening SOB, recommend PE workup but Wells with low probability at this time.   Patient Instructions  Continue symbicort  2 puffs Twice daily. Brush tongue and rinse mouth afterwards Continue Albuterol  inhaler 2 puffs or 3 mL neb every 6 hours as needed for shortness of breath or wheezing. Notify if symptoms persist despite rescue inhaler/neb use.  Continue flonase  nasal spray 2 sprays each nostril daily Continue singulair  1 tab At bedtime    Prednisone  taper. 3 tabs for 2 days, 2 tabs for 2 days, then 1 tab for 2 days, then stop . Take in AM with food I sent in a z pack for you to start if you don't feel better in the next 48 hours or if you start feeling worse   Continue Hycodan cough syrup 5 mL every 6 hours as needed for cough. Do not drive after taking. Continue Benzonatate  1 capsule Three times a day for cough Continue Mucinex  or Mucinex  DM 600 mg Twice daily for cough/congestion over the counter  Follow up in 3 months Dr. Washington Hacker. If symptoms do not improve or worsen, please contact office for sooner  follow up with Gina Lagos or seek emergency care.    COVID-19 virus infection COVID positive 05/2023. Did not receive antiviral as he was out of the window upon testing. Advised that cough and fatigue can be slow to  resolve following COVID illness. Encouraged to monitor symptoms and advised on supportive care. See above.     Advised if symptoms do not improve or worsen, to please contact office for sooner follow up or seek emergency care.   I spent 35 minutes of dedicated to the care of this patient on the date of this encounter to include pre-visit review of records, face-to-face time with the patient discussing conditions above, post visit ordering of testing, clinical documentation with the electronic health record, making appropriate referrals as documented, and communicating necessary findings to members of the patients care team.  Roetta Clarke, NP 06/09/2023  Pt aware and understands NP's role.

## 2023-06-08 NOTE — Patient Instructions (Addendum)
 Continue symbicort  2 puffs Twice daily. Brush tongue and rinse mouth afterwards Continue Albuterol  inhaler 2 puffs or 3 mL neb every 6 hours as needed for shortness of breath or wheezing. Notify if symptoms persist despite rescue inhaler/neb use.  Continue flonase  nasal spray 2 sprays each nostril daily Continue singulair  1 tab At bedtime    Prednisone  taper. 3 tabs for 2 days, 2 tabs for 2 days, then 1 tab for 2 days, then stop . Take in AM with food I sent in a z pack for you to start if you don't feel better in the next 48 hours or if you start feeling worse   Continue Hycodan cough syrup 5 mL every 6 hours as needed for cough. Do not drive after taking. Continue Benzonatate  1 capsule Three times a day for cough Continue Mucinex  or Mucinex  DM 600 mg Twice daily for cough/congestion over the counter  Follow up in 3 months Dr. Washington Hacker. If symptoms do not improve or worsen, please contact office for sooner follow up with Alston Jerry Markon Jares,NP or seek emergency care.

## 2023-06-09 ENCOUNTER — Encounter: Payer: Self-pay | Admitting: Nurse Practitioner

## 2023-06-09 DIAGNOSIS — U071 COVID-19: Secondary | ICD-10-CM | POA: Insufficient documentation

## 2023-06-09 NOTE — Assessment & Plan Note (Addendum)
 Recent COVID illness with mild asthma exacerbation. Overall improving with residual DOE, cough and fatigue. Slight worsening with discontinuation of prednisone . Lung exam is clear today but given current symptoms and response to steroids, will challenge him with prednisone  taper. Advised that if he fails to improve or worsens over the next 48 hours, to start empiric azithromycin . Continue bronchodilator regimen. Recent CXR clear. Advised to continue supportive care measures. Action plan in place. If he develops worsening SOB, recommend PE workup but Wells with low probability at this time.   Patient Instructions  Continue symbicort  2 puffs Twice daily. Brush tongue and rinse mouth afterwards Continue Albuterol  inhaler 2 puffs or 3 mL neb every 6 hours as needed for shortness of breath or wheezing. Notify if symptoms persist despite rescue inhaler/neb use.  Continue flonase  nasal spray 2 sprays each nostril daily Continue singulair  1 tab At bedtime    Prednisone  taper. 3 tabs for 2 days, 2 tabs for 2 days, then 1 tab for 2 days, then stop . Take in AM with food I sent in a z pack for you to start if you don't feel better in the next 48 hours or if you start feeling worse   Continue Hycodan cough syrup 5 mL every 6 hours as needed for cough. Do not drive after taking. Continue Benzonatate  1 capsule Three times a day for cough Continue Mucinex  or Mucinex  DM 600 mg Twice daily for cough/congestion over the counter  Follow up in 3 months Dr. Washington Hacker. If symptoms do not improve or worsen, please contact office for sooner follow up with Thomas Jerry Verla Bryngelson,NP or seek emergency care.

## 2023-06-09 NOTE — Assessment & Plan Note (Signed)
 COVID positive 05/2023. Did not receive antiviral as he was out of the window upon testing. Advised that cough and fatigue can be slow to resolve following COVID illness. Encouraged to monitor symptoms and advised on supportive care. See above.

## 2023-06-13 ENCOUNTER — Ambulatory Visit (INDEPENDENT_AMBULATORY_CARE_PROVIDER_SITE_OTHER): Admitting: Physical Therapy

## 2023-06-13 ENCOUNTER — Other Ambulatory Visit: Payer: Self-pay

## 2023-06-13 ENCOUNTER — Encounter: Payer: Self-pay | Admitting: Physical Therapy

## 2023-06-13 DIAGNOSIS — M6281 Muscle weakness (generalized): Secondary | ICD-10-CM

## 2023-06-13 DIAGNOSIS — M25511 Pain in right shoulder: Secondary | ICD-10-CM

## 2023-06-13 DIAGNOSIS — G8929 Other chronic pain: Secondary | ICD-10-CM

## 2023-06-13 NOTE — Patient Instructions (Signed)
 Access Code: [email or web address] URL: [email or web address] Date: 06/13/2023 Prepared by: Leah Primus  Exercises - Single Arm Scaption with Resistance  - 3 x weekly - 3 sets - 10 reps - Shoulder External Rotation with Anchored Resistance  - 3 x weekly - 3 sets - 10 reps - Standing Row with Anchored Resistance  - 3 x weekly - 3 sets - 10 reps

## 2023-06-13 NOTE — Therapy (Signed)
 OUTPATIENT PHYSICAL THERAPY EVALUATION   Patient Name: Thomas [name] MRN: [id number] DOB:Jun 19, 1960, 63 y.o., male Today's Date: 06/13/2023   END OF SESSION:  PT End of Session - 06/13/23 1201     Visit Number 1    Number of Visits 9    Date for PT Re-Evaluation 08/08/23    Authorization Type BCBS    PT Start Time 1155    PT Stop Time 1230    PT Time Calculation (min) 35 min    Activity Tolerance Patient tolerated treatment well    Behavior During Therapy [facility] for tasks assessed/performed             Past Medical History:  Diagnosis Date   Allergy    Asthma    Back pain    COVID-18 Nov 2021   Diabetes [facility])    GERD (gastroesophageal reflux disease)    History of hiatal hernia    Nasal polyposis    Pneumonia    5 -7 yrs ago   Swallowing difficulty    Past Surgical History:  Procedure Laterality Date   ANTERIOR CERVICAL DECOMP/DISCECTOMY FUSION N/A 07/14/2016   Procedure: Cervical five-six5- Cervical six-seven Anterior cervical decompression/discectomy/fusion;  Surgeon: Manya Sells, MD;  Location: [facility] OR;  Service: Neurosurgery;  Laterality: N/A;   BRONCHIAL WASHINGS  12/27/2021   Procedure: BRONCHIAL WASHINGS;  Surgeon: Mannam, Praveen, MD;  Location: [facility]COPY;  Service: Cardiopulmonary;;   COLON SURGERY     COLONOSCOPY  12/22/2014   per Dr. Andriette Keeling, clear, repeat in 5 yrs (hx of adenomas)    DG THUMB LEFT HAND     DG THUMB RIGHT HAND ([facility] HX)     FUNCTIONAL ENDOSCOPIC SINUS SURGERY     PARTIAL COLECTOMY     sigmoid (12in), Dr. Andriette Keeling   VIDEO BRONCHOSCOPY N/A 12/27/2021   Procedure: VIDEO BRONCHOSCOPY WITHOUT FLUORO;  Surgeon: Mannam, Praveen, MD;  Location: [facility];  Service: Cardiopulmonary;  Laterality: N/A;   Patient Active Problem List   Diagnosis Date Noted   COVID-19 virus infection 06/09/2023   BMI 34.0-34.9,adult 06/02/2022   Other fatigue 04/28/2022   SOBOE (shortness of breath on exertion) 04/28/2022   Essential hypertension  04/06/2022   Environmental and seasonal allergies 04/05/2022   Lung nodule 01/03/2022   Lobar pneumonia (HCC) 12/24/2021   Type 2 diabetes mellitus with obesity (HCC) 09/21/2021   Hyperlipidemia 09/21/2021   Class 2 severe obesity with serious comorbidity and body mass index (BMI) of 35.0 to 35.9 in adult [facility]) 09/21/2021   Asthma 12/29/2020   Arthralgia of left temporomandibular joint 06/09/2020   Vitamin D  deficiency 04/17/2018   Generalized obesity 12/21/2017   Type 2 diabetes mellitus without complications (HCC) 07/07/2017   Chronic sinusitis 08/30/2016   Nasal polyposis 08/30/2016   Herniated cervical disc without myelopathy 07/14/2016   Meralgia paresthetica of right side 07/13/2015   BMI 35.0-35.9,adult 09/19/2012   GERD 09/09/2009   ABDOMINAL PAIN 09/09/2009    PCP: Donley Furth, MD  REFERRING PROVIDER: Ulysees Gander, DO  REFERRING DIAG: Right shoulder pain, unspecified chronicity  THERAPY DIAG:  Chronic right shoulder pain  Muscle weakness (generalized)  Rationale for Evaluation and Treatment: Rehabilitation  ONSET DATE: Chronic, about 2 months   SUBJECTIVE:           SUBJECTIVE STATEMENT: Patient reports right shoulder pain and had a shot that helped tremendously. Since the injection he does have some pain if he over-does it like when waxing his car. Currently he  is feeling good today. His main issue is trying to reach behind his back.  Hand dominance: Right  PERTINENT HISTORY: See PMH above  PAIN:  Are you having pain? Yes:  NPRS scale: 0/10 currently, 2-3/10 at worst Pain location: Right shoulder Pain description: Weak Aggravating factors: Activity using the right shoulder Relieving factors: Rest, medication  PRECAUTIONS: None  RED FLAGS: None   WEIGHT BEARING RESTRICTIONS: No  FALLS:  Has patient fallen in last 6 months? No  OCCUPATION: [employer]  PLOF: Independent  PATIENT GOALS: Improve shoulder strength and activity  tolerance   OBJECTIVE:  Note: Objective measures were completed at Evaluation unless otherwise noted. PATIENT SURVEYS:  Quick Dash 13.6% disability  COGNITION: Overall cognitive status: Within functional limits for tasks assessed     SENSATION: WFL  POSTURE: Rounded shoulder posture  UPPER EXTREMITY ROM:   Active ROM Right eval Left eval  Shoulder flexion 150 155  Shoulder extension    Shoulder abduction 150 160  Shoulder adduction    Shoulder internal rotation T11 T7  Shoulder external rotation 65 / T4 60 / T4  Elbow flexion    Elbow extension    Wrist flexion    Wrist extension    Wrist ulnar deviation    Wrist radial deviation    Wrist pronation    Wrist supination    (Blank rows = not tested)  UPPER EXTREMITY MMT:  MMT Right eval Left eval  Shoulder flexion 4 5  Shoulder extension    Shoulder abduction 5 5  Shoulder adduction    Shoulder internal rotation 5 5  Shoulder external rotation 5 5  Middle trapezius    Lower trapezius    Elbow flexion 5 5  Elbow extension 5 5  Wrist flexion    Wrist extension    Wrist ulnar deviation    Wrist radial deviation    Wrist pronation    Wrist supination    Grip strength (lbs)    (Blank rows = not tested)  JOINT MOBILITY TESTING:  Slight hypomobility right shoulder compared to left  PALPATION:  Tender to palpation at right bicipital groove region                                                                                                                             TREATMENT  [facility] Adult PT Treatment:                                                DATE: 06/13/2023 Standing scaption with red x 10 Shoulder ER with green x 10 Row with black x 10  PATIENT EDUCATION: Education details: Exam findings, POC, HEP Person educated: Patient Education method: Explanation, Demonstration, Tactile cues, Verbal cues, and Handouts Education comprehension: verbalized understanding, returned demonstration, verbal cues  required, tactile cues required, and needs further  education  HOME EXERCISE PROGRAM: Access Code: [email or web address]    ASSESSMENT: CLINICAL IMPRESSION: Patient is a 63 y.o. male who was seen today for physical therapy evaluation and treatment for chronic right shoulder pain and weakness. He seems to be doing very well since his shoulder injection but does still exhibit slight limitations in right shoulder motion and strength that are likely contributing to pain with activity and impacting his functional ability.  OBJECTIVE IMPAIRMENTS: decreased activity tolerance, decreased ROM, decreased strength, and pain.   ACTIVITY LIMITATIONS: lifting, reach over head, and hygiene/grooming  PARTICIPATION LIMITATIONS: cleaning and community activity  PERSONAL FACTORS: Past/current experiences and Time since onset of injury/illness/exacerbation are also affecting patient's functional outcome.   REHAB POTENTIAL: Good  CLINICAL DECISION MAKING: Stable/uncomplicated  EVALUATION COMPLEXITY: Low   GOALS: Goals reviewed with patient? Yes  SHORT TERM GOALS: Target date: 07/11/2023  Patient will be I with initial HEP in order to progress with therapy. Baseline: HEP provided at eval Goal status: INITIAL  2.  Patient will report right shoulder pain </= 1/10 with all activity to reduce functional limitations Baseline: 2-3/10 Goal status: INITIAL  LONG TERM GOALS: Target date: 08/08/2023  Patient will be I with final HEP to maintain progress from PT. Baseline: HEP provided at eval Goal status: INITIAL  2.  Patient will report QuickDASH </= 5% in order to indicate an improvement in their functional status Baseline: 13.6% disability Goal status: INITIAL  3.  Patient will demonstrate right shoulder strength 5/5 MMT to improve activity tolerance and return to gym exercises without pain or limitation Baseline: see limitation above Goal status: INITIAL  4.  Patient will improve functional IR reach behind  back to >/= T8 to improve bathing and grooming tasks Baseline: reach to T11 Goal status: INITIAL   PLAN: PT FREQUENCY: 1x/week  PT DURATION: 8 weeks  PLANNED INTERVENTIONS: 97164- PT Re-evaluation, 97110-Therapeutic exercises, 97530- Therapeutic activity, 97112- Neuromuscular re-education, 97535- Self Care, 54098- Manual therapy, Patient/Family education, Taping, Dry Needling, Joint mobilization, Joint manipulation, Cryotherapy, and Moist heat  PLAN FOR NEXT SESSION: Review HEP and progress PRN, manual/mobs/stretching to improve functional IR reach behind back, continue progression of right shoulder strengthening, work into overhead activity, shoulder endurance and rhythmic stab    Leah Primus, PT, DPT, LAT, ATC 06/13/23  12:51 PM Phone: [phone] Fax: [phone]

## 2023-06-15 ENCOUNTER — Ambulatory Visit: Admitting: Bariatrics

## 2023-06-15 ENCOUNTER — Encounter: Payer: Self-pay | Admitting: Bariatrics

## 2023-06-15 VITALS — BP 150/76 | HR 91 | Temp 97.9°F | Ht 70.0 in | Wt 241.0 lb

## 2023-06-15 DIAGNOSIS — E559 Vitamin D deficiency, unspecified: Secondary | ICD-10-CM

## 2023-06-15 DIAGNOSIS — E1169 Type 2 diabetes mellitus with other specified complication: Secondary | ICD-10-CM

## 2023-06-15 DIAGNOSIS — E669 Obesity, unspecified: Secondary | ICD-10-CM | POA: Diagnosis not present

## 2023-06-15 DIAGNOSIS — E66811 Obesity, class 1: Secondary | ICD-10-CM

## 2023-06-15 DIAGNOSIS — Z6834 Body mass index (BMI) 34.0-34.9, adult: Secondary | ICD-10-CM

## 2023-06-15 DIAGNOSIS — E6609 Other obesity due to excess calories: Secondary | ICD-10-CM

## 2023-06-15 DIAGNOSIS — Z7985 Long-term (current) use of injectable non-insulin antidiabetic drugs: Secondary | ICD-10-CM

## 2023-06-15 MED ORDER — VITAMIN D (ERGOCALCIFEROL) 1.25 MG (50000 UNIT) PO CAPS: ORAL_CAPSULE | ORAL | 0 refills | Status: DC

## 2023-06-15 NOTE — Progress Notes (Addendum)
 WEIGHT SUMMARY AND BIOMETRICS  Weight Lost Since Last Visit: 5lb  Weight Gained Since Last Visit: 0   Vitals Temp: 97.9 F (36.6 C) BP: (!) 150/76 Pulse Rate: 91 SpO2: 97 %   Anthropometric Measurements Height: 5\' 10"  (1.778 m) Weight: 241 lb (109.3 kg) BMI (Calculated): 34.58 Weight at Last Visit: 246lb Weight Lost Since Last Visit: 5lb Weight Gained Since Last Visit: 0 Starting Weight: 249lb Total Weight Loss (lbs): 7 lb (3.175 kg)   Body Composition  Body Fat %: 31.2 % Fat Mass (lbs): 75.2 lbs Muscle Mass (lbs): 157.8 lbs Total Body Water (lbs): 112.4 lbs Visceral Fat Rating : 19   Other Clinical Data Fasting: no Labs: no Today's Visit #: 21 Starting Date: 04/14/21    OBESITY Thomas [name] is here to discuss his progress with his obesity treatment plan along with follow-up of his obesity related diagnoses.    Nutrition Plan: low carbohydrate plan - 85-90% adherence.  Current exercise: Stretches  Interim History:  He is down 5 lbs since his last visit.  Eating all of the food on the plan., Protein intake is as prescribed, Is skipping meals, Journaling consistently., and Meeting protein goals.   Pharmacotherapy: Thomas [name] is on Mounjaro 2.5 mg weekly  Adverse side effects: None Hunger is moderately controlled.  Cravings are moderately controlled.  Assessment/Plan:   Diabetes type 2 with obesity:  Last A1c was 6.4  Medication(s): Mounjaro  Lab Results  Component Value Date   HGBA1C 6.4 03/07/2023   HGBA1C 6.3 (H) 12/24/2021   HGBA1C 5.5 09/21/2021   HGBA1C 6.5 02/24/2021   HGBA1C 6.0 12/25/2018   Lab Results  Component Value Date   INSULIN  12.0 09/21/2021   INSULIN  18.0 04/14/2021   INSULIN  10.9 02/12/2018   INSULIN  14.1 10/23/2017    Plan: Will minimize all refined carbohydrates both sweets and starches.  Will work on the plan  and exercise.  Consider both aerobic and resistance training.  Will keep protein, water, and fiber intake high.  Increase Polyunsaturated and Monounsaturated fats to increase satiety and encourage weight loss.  Aim for 7 to 9 hours of sleep nightly.  Continue Mounjaro 2.5 mg SQ weekly Will consider increasing his dose at the next visit. He accidentially bent 1 needle and will need another one. Will get him a sample of Mounjaro at the same dose of 2.5 mg. Will call the pharmaceutical representative.    Vitamin D  Deficiency Vitamin D  is at goal of 50.  Most recent vitamin D  level was 55.5. He is on  prescription ergocalciferol  50,000 IU weekly. Lab Results  Component Value Date   VD25OH 55.5 09/21/2021   VD25OH 43.6 04/14/2021   VD25OH 56.1 02/12/2018    Plan: Refill prescription vitamin D  50,000 IU weekly.    Generalized Obesity: Current BMI BMI (Calculated): 34.58   Pharmacotherapy Plan Continue  Mounjaro 2.5 mg SQ  weekly patient had broken one of his Mounjaro needles and needed a replacement.  I contacted the pharmaceutical rep in the area and got him a replacement pen the lot number was [id number], expiration date was 10/13/2024 for Mounjaro 2.5 mg.   Thomas [name] is currently in the action stage of change. As such, his goal is to continue with weight loss efforts.  He has agreed to keeping a food journal with goal of 1,000 calories and 80 grams of protein daily with low carbohydrate options.   Exercise goals: All adults should avoid inactivity. Some physical activity is better than none, and adults who participate in any amount of physical activity gain some health benefits. He will increase both his cardio and resistance.   Behavioral modification strategies: no meal skipping, meal planning , increase water intake, better snacking choices, planning for success, keep healthy foods in the home, increase frequency of journaling, and mindful eating.  Thomas [name] has agreed to follow-up with our  clinic in 4 weeks.       Objective:   VITALS: Per patient if applicable, see vitals. GENERAL: Alert and in no acute distress. CARDIOPULMONARY: No increased WOB. Speaking in clear sentences.  PSYCH: Pleasant and cooperative. Speech normal rate and rhythm. Affect is appropriate. Insight and judgement are appropriate. Attention is focused, linear, and appropriate.  NEURO: Oriented as arrived to appointment on time with no prompting.   Attestation Statements:   This was prepared with the assistance of Engineer, civil (consulting).  Occasional wrong-word or sound-a-like substitutions may have occurred due to the inherent limitations of voice recognition   Kirk Peper, DO

## 2023-06-19 ENCOUNTER — Other Ambulatory Visit: Payer: Self-pay | Admitting: Family Medicine

## 2023-06-19 ENCOUNTER — Other Ambulatory Visit: Payer: Self-pay | Admitting: Bariatrics

## 2023-06-19 ENCOUNTER — Telehealth: Payer: Self-pay | Admitting: Family Medicine

## 2023-06-19 DIAGNOSIS — E559 Vitamin D deficiency, unspecified: Secondary | ICD-10-CM

## 2023-06-19 DIAGNOSIS — K219 Gastro-esophageal reflux disease without esophagitis: Secondary | ICD-10-CM

## 2023-06-19 NOTE — Telephone Encounter (Signed)
 Copied from CRM [id number]. Topic: Clinical - Medication Refill >> Jun 19, 2023  2:25 PM Dewanda Foots wrote: Medication: budesonide -formoterol  (SYMBICORT ) 160-4.5 MCG/ACT inhaler  Has the patient contacted their pharmacy? Yes (Agent: If no, request that the patient contact the pharmacy for the refill. If patient does not wish to contact the pharmacy document the reason why and proceed with request.) (Agent: If yes, when and what did the pharmacy advise?)  This is the patient's preferred pharmacy:   [facility] [phone] Jonette Nestle, [place] - [address] [place] [address] Phone: [phone] Fax: [phone]  Is this the correct pharmacy for this prescription? Yes If no, delete pharmacy and type the correct one.   Has the prescription been filled recently? Yes  Is the patient out of the medication? No, but only has 1 day left  Has the patient been seen for an appointment in the last year OR does the patient have an upcoming appointment? Yes  Can we respond through MyChart? No, please call [phone]  Agent: Please be advised that Rx refills may take up to 3 business days. We ask that you follow-up with your pharmacy.  Pt requests 90-day supply please

## 2023-06-19 NOTE — Progress Notes (Signed)
 Thomas [name] D.[facility] [address] [place] [address] Phone: [phone]   Assessment and Plan:     1. Acute pain of right shoulder - Subacute, improving, subsequent visit - Overall significant improvement after subacromial CSI at previous office visit on 05/23/2023, starting HEP and physical therapy.  Consistent with resolving flare of subacromial bursitis - Continue HEP and physical therapy - Recommend Tylenol  for day-to-day pain relief.  May use Celebrex  100 to 200 mg daily as needed for pain relief.  Recommend limiting chronic NSAIDs to 1-2 doses per week   Pertinent previous records reviewed include none  Follow Up: As needed.  Could consider NSAID/prednisone  course versus repeat CSI   Subjective:   I, Thomas [name], am serving as a Neurosurgeon for Doctor Thomas [name]   Chief Complaint: right shoulder pain    HPI:    05/23/2023 Patient is a 63 year old male with right shoulder pain. Patient states  pain for about a month or so. Pain is better. No MOI. Decreased ROM . He states his arm got weak. Celebrex  has helped . Hx of cervical surgery. Pain feels deep. No numbness or tingling.   06/20/2023 Patient states he is feeling much better    Relevant Historical Information: Hypertension, DM type II  Additional pertinent review of systems negative.   Current Outpatient Medications:    albuterol  (PROVENTIL ) (2.5 MG/3ML) 0.083% nebulizer solution, Take 3 mLs (2.5 mg total) by nebulization every 4 (four) hours as needed for wheezing or shortness of breath., Disp: 75 mL, Rfl: 12   albuterol  (VENTOLIN  HFA) 108 (90 Base) MCG/ACT inhaler, INHALE 2 PUFFS EVERY 6 HOURS AS NEEDED FOR WHEEZING OR SHORTNESS OF BREATH., Disp: 54 each, Rfl: 0   azithromycin  (ZITHROMAX ) 250 MG tablet, Take 2 tablets on day one then take 1 tablet daily for four additional days, Disp: 6 tablet, Rfl: 0   benzonatate  (TESSALON ) 200 MG capsule, Take 1 capsule (200 mg  total) by mouth 3 (three) times daily as needed for cough., Disp: 30 capsule, Rfl: 1   budesonide -formoterol  (SYMBICORT ) 160-4.5 MCG/ACT inhaler, Inhale 2 puffs into the lungs 2 (two) times daily., Disp: 30.6 g, Rfl: 3   celecoxib  (CELEBREX ) 200 MG capsule, Take 1 capsule (200 mg total) by mouth 2 (two) times daily as needed., Disp: 60 capsule, Rfl: 2   fluticasone  (FLONASE ) 50 MCG/ACT nasal spray, SPRAY 2 SPRAYS INTO EACH NOSTRIL EVERY DAY, Disp: 48 mL, Rfl: 0   HYDROcodone  bit-homatropine (HYCODAN) 5-1.5 MG/5ML syrup, Take 5 mLs by mouth every 6 (six) hours as needed for cough., Disp: 240 mL, Rfl: 0   ketoconazole  (NIZORAL ) 2 % cream, Apply 1 Application topically 2 (two) times daily., Disp: 60 g, Rfl: 3   lansoprazole  (PREVACID ) 15 MG capsule, TAKE 1 CAPSULE BY MOUTH EVERY DAY, Disp: 90 capsule, Rfl: 1   methocarbamol  (ROBAXIN ) 500 MG tablet, Take 1 tablet (500 mg total) by mouth every 6 (six) hours as needed for muscle spasms., Disp: 60 tablet, Rfl: 5   metroNIDAZOLE  (FLAGYL ) 500 MG tablet, Take 1 tablet (500 mg total) by mouth 3 (three) times daily., Disp: 30 tablet, Rfl: 0   montelukast  (SINGULAIR ) 10 MG tablet, Take 1 tablet (10 mg total) by mouth daily., Disp: 90 tablet, Rfl: 3   nystatin  (MYCOSTATIN ) 100000 UNIT/ML suspension, Take 5 mLs (500,000 Units total) by mouth 4 (four) times daily., Disp: 473 mL, Rfl: 0   predniSONE  (DELTASONE ) 10 MG tablet, 3 tabs for 2 days,  2 tabs for 2 days, then 1 tab for 2 days, then stop, Disp: 12 tablet, Rfl: 0   tirzepatide (MOUNJARO) 2.5 MG/0.5ML Pen, Inject 2.5 mg into the skin once a week., Disp: 6 mL, Rfl: 0   triamcinolone  cream (KENALOG ) 0.1 %, Apply 1 Application topically 2 (two) times daily as needed (rash)., Disp: 45 g, Rfl: 2   Vitamin D , Ergocalciferol , (DRISDOL ) 1.25 MG (50000 UNIT) CAPS capsule, TAKE 1 CAPSULE (50,000 UNITS TOTAL) BY MOUTH EVERY 14 DAYS, Disp: 6 capsule, Rfl: 0   Objective:     Vitals:   06/20/23 1258  BP: 138/84  Pulse:  96  SpO2: 100%  Weight: 238 lb (108 kg)  Height: 5\' 10"  (1.778 m)      Body mass index is 34.15 kg/m.    Physical Exam:    Gen: Appears well, nad, nontoxic and pleasant Neuro:sensation intact, strength is 5/5 with df/pf/inv/ev, muscle tone wnl Skin: no suspicious lesion or defmority Psych: A&O, appropriate mood and affect  Right shoulder:  No deformity, swelling or muscle wasting No scapular winging FF 180, abd 180, int 5, ext 90 NTTP over the [place], clavicle, ac, coracoid, biceps groove, humerus, deltoid, trapezius, cervical spine Neg neer, hawkins, empty can, obriens, crossarm, subscap liftoff, speeds Neg ant drawer, sulcus sign, apprehension Negative Spurling's test bilat FROM of neck    Electronically signed by:  Thomas [name] D.[facility] 1:06 PM 06/20/23

## 2023-06-20 ENCOUNTER — Ambulatory Visit: Admitting: Sports Medicine

## 2023-06-20 ENCOUNTER — Telehealth (HOSPITAL_BASED_OUTPATIENT_CLINIC_OR_DEPARTMENT_OTHER): Payer: Self-pay

## 2023-06-20 VITALS — BP 138/84 | HR 96 | Ht 70.0 in | Wt 238.0 lb

## 2023-06-20 DIAGNOSIS — M25511 Pain in right shoulder: Secondary | ICD-10-CM

## 2023-06-20 MED ORDER — BUDESONIDE-FORMOTEROL FUMARATE 160-4.5 MCG/ACT IN AERO: 2.0000 | INHALATION_SPRAY | Freq: Two times a day (BID) | RESPIRATORY_TRACT | 3 refills | Status: DC

## 2023-06-20 NOTE — Telephone Encounter (Signed)
 Copied from CRM [id number]. Topic: Clinical - Prescription Issue >> Jun 20, 2023 10:59 AM Ilene Malling wrote: Reason for CRM: Patient [phone] states is out of generic Symbicort  160-4.5 mcg medication and [facility] advised patient they are waiting for refill approval. Patient called Dr. Leonce Ralph office yesterday to request a refill. Patient is unsure who's the provider prescribing Symbicort  160-4.5 mcg, is it Dr. Alyne Babinski or Dr. Washington Hacker? Patient is out of medication and needs a refill done today. Patient needs a 90 days supply. Please advise and call back.   [id number], [place] - [address] [place] [address] Phone:[phone]

## 2023-06-29 ENCOUNTER — Encounter: Payer: Self-pay | Admitting: Physical Therapy

## 2023-06-29 ENCOUNTER — Other Ambulatory Visit: Payer: Self-pay

## 2023-06-29 ENCOUNTER — Ambulatory Visit (INDEPENDENT_AMBULATORY_CARE_PROVIDER_SITE_OTHER): Admitting: Physical Therapy

## 2023-06-29 DIAGNOSIS — M6281 Muscle weakness (generalized): Secondary | ICD-10-CM

## 2023-06-29 DIAGNOSIS — M25511 Pain in right shoulder: Secondary | ICD-10-CM | POA: Diagnosis not present

## 2023-06-29 DIAGNOSIS — G8929 Other chronic pain: Secondary | ICD-10-CM

## 2023-06-29 NOTE — Therapy (Signed)
 OUTPATIENT PHYSICAL THERAPY TREATMENT   Patient Name: Thomas [name] MRN: [id number] DOB:07-25-1960, 63 y.o., male Today's Date: 06/29/2023   END OF SESSION:  PT End of Session - 06/29/23 1037     Visit Number 2    Number of Visits 9    Date for PT Re-Evaluation 08/08/23    Authorization Type BCBS    PT Start Time 1018    PT Stop Time 1056    PT Time Calculation (min) 38 min    Activity Tolerance Patient tolerated treatment well    Behavior During Therapy [facility] for tasks assessed/performed              Past Medical History:  Diagnosis Date   Allergy    Asthma    Back pain    COVID-18 Nov 2021   Diabetes [facility])    GERD (gastroesophageal reflux disease)    History of hiatal hernia    Nasal polyposis    Pneumonia    5 -7 yrs ago   Swallowing difficulty    Past Surgical History:  Procedure Laterality Date   ANTERIOR CERVICAL DECOMP/DISCECTOMY FUSION N/A 07/14/2016   Procedure: Cervical five-six5- Cervical six-seven Anterior cervical decompression/discectomy/fusion;  Surgeon: Manya Sells, MD;  Location: [facility] OR;  Service: Neurosurgery;  Laterality: N/A;   BRONCHIAL WASHINGS  12/27/2021   Procedure: BRONCHIAL WASHINGS;  Surgeon: Mannam, Praveen, MD;  Location: [facility];  Service: Cardiopulmonary;;   COLON SURGERY     COLONOSCOPY  12/22/2014   per Dr. Andriette Keeling, clear, repeat in 5 yrs (hx of adenomas)    DG THUMB LEFT HAND     DG THUMB RIGHT HAND ([facility] HX)     FUNCTIONAL ENDOSCOPIC SINUS SURGERY     PARTIAL COLECTOMY     sigmoid (12in), Dr. Andriette Keeling   VIDEO BRONCHOSCOPY N/A 12/27/2021   Procedure: VIDEO BRONCHOSCOPY WITHOUT FLUORO;  Surgeon: Mannam, Praveen, MD;  Location: [facility];  Service: Cardiopulmonary;  Laterality: N/A;   Patient Active Problem List   Diagnosis Date Noted   COVID-19 virus infection 06/09/2023   BMI 34.0-34.9,adult 06/02/2022   Other fatigue 04/28/2022   SOBOE (shortness of breath on exertion) 04/28/2022   Essential hypertension  04/06/2022   Environmental and seasonal allergies 04/05/2022   Lung nodule 01/03/2022   Lobar pneumonia (HCC) 12/24/2021   Type 2 diabetes mellitus with obesity (HCC) 09/21/2021   Hyperlipidemia 09/21/2021   Class 2 severe obesity with serious comorbidity and body mass index (BMI) of 35.0 to 35.9 in adult [facility]) 09/21/2021   Asthma 12/29/2020   Arthralgia of left temporomandibular joint 06/09/2020   Vitamin D  deficiency 04/17/2018   Generalized obesity 12/21/2017   Type 2 diabetes mellitus without complications (HCC) 07/07/2017   Chronic sinusitis 08/30/2016   Nasal polyposis 08/30/2016   Herniated cervical disc without myelopathy 07/14/2016   Meralgia paresthetica of right side 07/13/2015   BMI 35.0-35.9,adult 09/19/2012   GERD 09/09/2009   ABDOMINAL PAIN 09/09/2009    PCP: Donley Furth, MD  REFERRING PROVIDER: Ulysees Gander, DO  REFERRING DIAG: Right shoulder pain, unspecified chronicity  THERAPY DIAG:  Chronic right shoulder pain  Muscle weakness (generalized)  Rationale for Evaluation and Treatment: Rehabilitation  ONSET DATE: Chronic, about 2 months   SUBJECTIVE:           SUBJECTIVE STATEMENT: Patient reports he has been doing some exercises for his shoulder which has been going well.   Eval: Patient reports right shoulder pain and had a shot that helped  tremendously. Since the injection he does have some pain if he over-does it like when waxing his car. Currently he is feeling good today. His main issue is trying to reach behind his back.  Hand dominance: Right  PERTINENT HISTORY: See PMH above  PAIN:  Are you having pain? Yes:  NPRS scale: 0/10 currently, 2-3/10 at worst Pain location: Right shoulder Pain description: Weak Aggravating factors: Activity using the right shoulder Relieving factors: Rest, medication  PRECAUTIONS: None  PATIENT GOALS: Improve shoulder strength and activity tolerance   OBJECTIVE:  Note: Objective measures were  completed at Evaluation unless otherwise noted. PATIENT SURVEYS:  Quick Dash 13.6% disability  POSTURE: Rounded shoulder posture  UPPER EXTREMITY ROM:   Active ROM Right eval Left eval  Shoulder flexion 150 155  Shoulder extension    Shoulder abduction 150 160  Shoulder adduction    Shoulder internal rotation T11 T7  Shoulder external rotation 65 / T4 60 / T4  Elbow flexion    Elbow extension    Wrist flexion    Wrist extension    Wrist ulnar deviation    Wrist radial deviation    Wrist pronation    Wrist supination    (Blank rows = not tested)  UPPER EXTREMITY MMT:  MMT Right eval Left eval  Shoulder flexion 4 5  Shoulder extension    Shoulder abduction 5 5  Shoulder adduction    Shoulder internal rotation 5 5  Shoulder external rotation 5 5  Middle trapezius    Lower trapezius    Elbow flexion 5 5  Elbow extension 5 5  Wrist flexion    Wrist extension    Wrist ulnar deviation    Wrist radial deviation    Wrist pronation    Wrist supination    Grip strength (lbs)    (Blank rows = not tested)  JOINT MOBILITY TESTING:  Slight hypomobility right shoulder compared to left  PALPATION:  Tender to palpation at right bicipital groove region                                                                                                                             TREATMENT  [facility] Adult PT Treatment:                                                DATE: 06/29/2023 UBE L3 x 5 min (fwd/bwd) to improve endurance and workload capacity Supine serratus punch with 8# 2 x 20 Supine horizontal abduction with blue 2 x 15 Sidelying shoulder ER with 3# 3 x 10 Standing scaption with 3# 3 x 10 Bent over row supported on table with 20# 3 x 10 Standing wall ball circles at 90 deg flexion 2 x 20 cw/ccw each  PATIENT EDUCATION: Education details: HEP update Person educated: Patient Education method:  Explanation, Demonstration, Tactile cues, Verbal cues, Handout Education  comprehension: verbalized understanding, returned demonstration, verbal cues required, tactile cues required, and needs further education  HOME EXERCISE PROGRAM: Access Code: [email or web address]    ASSESSMENT: CLINICAL IMPRESSION: Patient tolerated therapy well with no adverse effects. Therapy focused on continued progression of right shoulder strength and control with good tolerance. Progressed resistance and weight with periscapular and rotator cuff strengthening. He does require consistent cueing for performing the exercise in a slow and controlled movement and postural control. Updated HEP to further progress strengthening for home. Patient would benefit from continued skilled PT to progress mobility and strength in order to reduce pain and maximize functional ability.   Eval: Patient is a 62 y.o. male who was seen today for physical therapy evaluation and treatment for chronic right shoulder pain and weakness. He seems to be doing very well since his shoulder injection but does still exhibit slight limitations in right shoulder motion and strength that are likely contributing to pain with activity and impacting his functional ability.  OBJECTIVE IMPAIRMENTS: decreased activity tolerance, decreased ROM, decreased strength, and pain.   ACTIVITY LIMITATIONS: lifting, reach over head, and hygiene/grooming  PARTICIPATION LIMITATIONS: cleaning and community activity  PERSONAL FACTORS: Past/current experiences and Time since onset of injury/illness/exacerbation are also affecting patient's functional outcome.    GOALS: Goals reviewed with patient? Yes  SHORT TERM GOALS: Target date: 07/11/2023  Patient will be I with initial HEP in order to progress with therapy. Baseline: HEP provided at eval Goal status: INITIAL  2.  Patient will report right shoulder pain </= 1/10 with all activity to reduce functional limitations Baseline: 2-3/10 Goal status: INITIAL  LONG TERM GOALS: Target date:  08/08/2023  Patient will be I with final HEP to maintain progress from PT. Baseline: HEP provided at eval Goal status: INITIAL  2.  Patient will report QuickDASH </= 5% in order to indicate an improvement in their functional status Baseline: 13.6% disability Goal status: INITIAL  3.  Patient will demonstrate right shoulder strength 5/5 MMT to improve activity tolerance and return to gym exercises without pain or limitation Baseline: see limitation above Goal status: INITIAL  4.  Patient will improve functional IR reach behind back to >/= T8 to improve bathing and grooming tasks Baseline: reach to T11 Goal status: INITIAL   PLAN: PT FREQUENCY: 1x/week  PT DURATION: 8 weeks  PLANNED INTERVENTIONS: 97164- PT Re-evaluation, 97110-Therapeutic exercises, 97530- Therapeutic activity, 97112- Neuromuscular re-education, 97535- Self Care, 54098- Manual therapy, Patient/Family education, Taping, Dry Needling, Joint mobilization, Joint manipulation, Cryotherapy, and Moist heat  PLAN FOR NEXT SESSION: Review HEP and progress PRN, manual/mobs/stretching to improve functional IR reach behind back, continue progression of right shoulder strengthening, work into overhead activity, shoulder endurance and rhythmic stab    Leah Primus, PT, DPT, LAT, ATC 06/29/23  11:04 AM Phone: [phone] Fax: [phone]

## 2023-06-29 NOTE — Patient Instructions (Signed)
 Access Code: [email or web address] URL: [email or web address] Date: 06/29/2023 Prepared by: Leah Primus  Exercises - Single Arm Scaption with Resistance  - 3 x weekly - 3 sets - 10 reps - Shoulder External Rotation with Anchored Resistance  - 3 x weekly - 3 sets - 10 reps - Standing Row with Anchored Resistance  - 3 x weekly - 3 sets - 10 reps - Standing Shoulder Horizontal Abduction with Resistance  - 3 x weekly - 3 sets - 10 reps

## 2023-07-04 ENCOUNTER — Other Ambulatory Visit: Payer: Self-pay

## 2023-07-04 ENCOUNTER — Encounter: Payer: Self-pay | Admitting: Physical Therapy

## 2023-07-04 ENCOUNTER — Ambulatory Visit (INDEPENDENT_AMBULATORY_CARE_PROVIDER_SITE_OTHER): Admitting: Physical Therapy

## 2023-07-04 DIAGNOSIS — M25511 Pain in right shoulder: Secondary | ICD-10-CM

## 2023-07-04 DIAGNOSIS — M6281 Muscle weakness (generalized): Secondary | ICD-10-CM | POA: Diagnosis not present

## 2023-07-04 DIAGNOSIS — G8929 Other chronic pain: Secondary | ICD-10-CM

## 2023-07-04 NOTE — Patient Instructions (Signed)
 Access Code: [email or web address] URL: [email or web address] Date: 07/04/2023 Prepared by: Leah Primus  Exercises - Single Arm Scaption with Resistance  - 3 x weekly - 3 sets - 10 reps - Shoulder External Rotation with Anchored Resistance  - 3 x weekly - 3 sets - 10 reps - Standing Row with Anchored Resistance  - 3 x weekly - 3 sets - 10 reps - Standing Shoulder Horizontal Abduction with Resistance  - 3 x weekly - 3 sets - 10 reps - Standing shoulder flexion wall slides  - 1 x daily - 2 sets - 5 reps - 5 seconds hold - Corner Pec Major Stretch  - 1 x daily - 3 reps - 20 seconds hold

## 2023-07-04 NOTE — Therapy (Signed)
 OUTPATIENT PHYSICAL THERAPY TREATMENT   Patient Name: Thomas [name] MRN: [id number] DOB:[birth year], 63 y.o., male Today's Date: 07/04/2023   END OF SESSION:  PT End of Session - 07/04/23 1236     Visit Number 3    Number of Visits 9    Date for PT Re-Evaluation 08/08/23    Authorization Type BCBS    PT Start Time 1147    PT Stop Time 1228    PT Time Calculation (min) 41 min    Activity Tolerance Patient tolerated treatment well    Behavior During Therapy [facility] for tasks assessed/performed               Past Medical History:  Diagnosis Date   Allergy    Asthma    Back pain    COVID-18 Nov 2021   Diabetes [facility])    GERD (gastroesophageal reflux disease)    History of hiatal hernia    Nasal polyposis    Pneumonia    5 -7 yrs ago   Swallowing difficulty    Past Surgical History:  Procedure Laterality Date   ANTERIOR CERVICAL DECOMP/DISCECTOMY FUSION N/A 07/14/2016   Procedure: Cervical five-six5- Cervical six-seven Anterior cervical decompression/discectomy/fusion;  Surgeon: Manya Sells, MD;  Location: [facility] OR;  Service: Neurosurgery;  Laterality: N/A;   BRONCHIAL WASHINGS  12/27/2021   Procedure: BRONCHIAL WASHINGS;  Surgeon: Mannam, Praveen, MD;  Location: [facility];  Service: Cardiopulmonary;;   COLON SURGERY     COLONOSCOPY  12/22/2014   per Dr. Andriette Keeling, clear, repeat in 5 yrs (hx of adenomas)    DG THUMB LEFT HAND     DG THUMB RIGHT HAND ([facility] HX)     FUNCTIONAL ENDOSCOPIC SINUS SURGERY     PARTIAL COLECTOMY     sigmoid (12in), Dr. Andriette Keeling   VIDEO BRONCHOSCOPY N/A 12/27/2021   Procedure: VIDEO BRONCHOSCOPY WITHOUT FLUORO;  Surgeon: Mannam, Praveen, MD;  Location: [facility];  Service: Cardiopulmonary;  Laterality: N/A;   Patient Active Problem List   Diagnosis Date Noted   COVID-19 virus infection 06/09/2023   BMI 34.0-34.9,adult 06/02/2022   Other fatigue 04/28/2022   SOBOE (shortness of breath on exertion) 04/28/2022   Essential hypertension  04/06/2022   Environmental and seasonal allergies 04/05/2022   Lung nodule 01/03/2022   Lobar pneumonia (HCC) 12/24/2021   Type 2 diabetes mellitus with obesity (HCC) 09/21/2021   Hyperlipidemia 09/21/2021   Class 2 severe obesity with serious comorbidity and body mass index (BMI) of 35.0 to 35.9 in adult [facility]) 09/21/2021   Asthma 12/29/2020   Arthralgia of left temporomandibular joint 06/09/2020   Vitamin D  deficiency 04/17/2018   Generalized obesity 12/21/2017   Type 2 diabetes mellitus without complications (HCC) 07/07/2017   Chronic sinusitis 08/30/2016   Nasal polyposis 08/30/2016   Herniated cervical disc without myelopathy 07/14/2016   Meralgia paresthetica of right side 07/13/2015   BMI 35.0-35.9,adult 09/19/2012   GERD 09/09/2009   ABDOMINAL PAIN 09/09/2009    PCP: Donley Furth, MD  REFERRING PROVIDER: Ulysees Gander, DO  REFERRING DIAG: Right shoulder pain, unspecified chronicity  THERAPY DIAG:  Chronic right shoulder pain  Muscle weakness (generalized)  Rationale for Evaluation and Treatment: Rehabilitation  ONSET DATE: Chronic, about 2 months   SUBJECTIVE:           SUBJECTIVE STATEMENT: Patient reports he is doing good. He continues to exercise the shoulders.   Eval: Patient reports right shoulder pain and had a shot that helped tremendously. Since the  injection he does have some pain if he over-does it like when waxing his car. Currently he is feeling good today. His main issue is trying to reach behind his back.  Hand dominance: Right  PERTINENT HISTORY: See PMH above  PAIN:  Are you having pain? Yes:  NPRS scale: 0/10 currently, 2-3/10 at worst Pain location: Right shoulder Pain description: Weak Aggravating factors: Activity using the right shoulder Relieving factors: Rest, medication  PRECAUTIONS: None  PATIENT GOALS: Improve shoulder strength and activity tolerance   OBJECTIVE:  Note: Objective measures were completed at Evaluation  unless otherwise noted. PATIENT SURVEYS:  Quick Dash 13.6% disability  POSTURE: Rounded shoulder posture  UPPER EXTREMITY ROM:   Active ROM Right eval Left eval Right 07/04/2023  Shoulder flexion 150 155 155  Shoulder extension     Shoulder abduction 150 160   Shoulder adduction     Shoulder internal rotation T11 T7   Shoulder external rotation 65 / T4 60 / T4   Elbow flexion     Elbow extension     Wrist flexion     Wrist extension     Wrist ulnar deviation     Wrist radial deviation     Wrist pronation     Wrist supination     (Blank rows = not tested)  UPPER EXTREMITY MMT:  MMT Right eval Left eval  Shoulder flexion 4 5  Shoulder extension    Shoulder abduction 5 5  Shoulder adduction    Shoulder internal rotation 5 5  Shoulder external rotation 5 5  Middle trapezius    Lower trapezius    Elbow flexion 5 5  Elbow extension 5 5  Wrist flexion    Wrist extension    Wrist ulnar deviation    Wrist radial deviation    Wrist pronation    Wrist supination    Grip strength (lbs)    (Blank rows = not tested)  JOINT MOBILITY TESTING:  Slight hypomobility right shoulder compared to left  PALPATION:  Tender to palpation at right bicipital groove region                                                                                                                             TREATMENT  [facility] Adult PT Treatment:                                                DATE: 07/04/2023 UBE L3 x 4 min (fwd/bwd) to improve endurance and workload capacity Supine GHJ mobs primarily inferior and posterior direction at varius ranges of elevation Supine scap pinned cross body posterior cuff stretch  Standing wall slide for shoulder elevation stretch 5 x 5 sec Corner pec stretch 3 x 20 sec Standing double ER and scap retraction with blue 2 x 15 Standing scaption with  3# 3 x 10 Standing chest pull with L2 powerband 2 x 10 Standing bent elbow shoulder flexion with yellow loop at  wrist 2 x 10  PATIENT EDUCATION: Education details: HEP update Person educated: Patient Education method: Explanation, Demonstration, Tactile cues, Verbal cues, Handout Education comprehension: verbalized understanding, returned demonstration, verbal cues required, tactile cues required, and needs further education  HOME EXERCISE PROGRAM: Access Code: [email or web address]    ASSESSMENT: CLINICAL IMPRESSION: Patient tolerated therapy well with no adverse effects. He demonstrates improvement in his right shoulder flexion range of motion but did report some tightness at end range so performed some manual for the right shoulder with good therapeutic benefit. Therapy focused on continued strengthening for the rotator cuff and improving rotator cuff and scapular control with shoulder elevation. No increase in pain noted with therapy. Updated his HEP to include some shoulder stretches with good tolerance. Patient would benefit from continued skilled PT to progress mobility and strength in order to reduce pain and maximize functional ability.   Eval: Patient is a 63 y.o. male who was seen today for physical therapy evaluation and treatment for chronic right shoulder pain and weakness. He seems to be doing very well since his shoulder injection but does still exhibit slight limitations in right shoulder motion and strength that are likely contributing to pain with activity and impacting his functional ability.  OBJECTIVE IMPAIRMENTS: decreased activity tolerance, decreased ROM, decreased strength, and pain.   ACTIVITY LIMITATIONS: lifting, reach over head, and hygiene/grooming  PARTICIPATION LIMITATIONS: cleaning and community activity  PERSONAL FACTORS: Past/current experiences and Time since onset of injury/illness/exacerbation are also affecting patient's functional outcome.    GOALS: Goals reviewed with patient? Yes  SHORT TERM GOALS: Target date: 07/11/2023  Patient will be I with initial HEP in  order to progress with therapy. Baseline: HEP provided at eval Goal status: INITIAL  2.  Patient will report right shoulder pain </= 1/10 with all activity to reduce functional limitations Baseline: 2-3/10 Goal status: INITIAL  LONG TERM GOALS: Target date: 08/08/2023  Patient will be I with final HEP to maintain progress from PT. Baseline: HEP provided at eval Goal status: INITIAL  2.  Patient will report QuickDASH </= 5% in order to indicate an improvement in their functional status Baseline: 13.6% disability Goal status: INITIAL  3.  Patient will demonstrate right shoulder strength 5/5 MMT to improve activity tolerance and return to gym exercises without pain or limitation Baseline: see limitation above Goal status: INITIAL  4.  Patient will improve functional IR reach behind back to >/= T8 to improve bathing and grooming tasks Baseline: reach to T11 Goal status: INITIAL   PLAN: PT FREQUENCY: 1x/week  PT DURATION: 8 weeks  PLANNED INTERVENTIONS: 97164- PT Re-evaluation, 97110-Therapeutic exercises, 97530- Therapeutic activity, 97112- Neuromuscular re-education, 97535- Self Care, 95621- Manual therapy, Patient/Family education, Taping, Dry Needling, Joint mobilization, Joint manipulation, Cryotherapy, and Moist heat  PLAN FOR NEXT SESSION: Review HEP and progress PRN, manual/mobs/stretching to improve functional IR reach behind back, continue progression of right shoulder strengthening, work into overhead activity, shoulder endurance and rhythmic stab    Leah Primus, PT, DPT, LAT, ATC 07/04/23  12:39 PM Phone: [phone] Fax: [phone]

## 2023-07-11 ENCOUNTER — Encounter: Admitting: Physical Therapy

## 2023-07-18 ENCOUNTER — Other Ambulatory Visit: Payer: Self-pay

## 2023-07-18 ENCOUNTER — Ambulatory Visit (INDEPENDENT_AMBULATORY_CARE_PROVIDER_SITE_OTHER): Admitting: Physical Therapy

## 2023-07-18 ENCOUNTER — Encounter: Payer: Self-pay | Admitting: Physical Therapy

## 2023-07-18 DIAGNOSIS — M6281 Muscle weakness (generalized): Secondary | ICD-10-CM | POA: Diagnosis not present

## 2023-07-18 DIAGNOSIS — M25511 Pain in right shoulder: Secondary | ICD-10-CM | POA: Diagnosis not present

## 2023-07-18 DIAGNOSIS — G8929 Other chronic pain: Secondary | ICD-10-CM

## 2023-07-18 NOTE — Therapy (Signed)
 OUTPATIENT PHYSICAL THERAPY TREATMENT  DISCHARGE   Patient Name: Thomas [name] MRN: [id number] DOB:1960/05/18, 63 y.o., male Today's Date: 07/18/2023   END OF SESSION:  PT End of Session - 07/18/23 1149     Visit Number 4    Number of Visits 9    Date for PT Re-Evaluation 08/08/23    Authorization Type [employer]    PT Start Time 1145    PT Stop Time 1225    PT Time Calculation (min) 40 min    Activity Tolerance Patient tolerated treatment well    Behavior During Therapy [facility] for tasks assessed/performed             Past Medical History:  Diagnosis Date   Allergy    Asthma    Back pain    COVID-18 Nov 2021   Diabetes [facility])    GERD (gastroesophageal reflux disease)    History of hiatal hernia    Nasal polyposis    Pneumonia    5 -7 yrs ago   Swallowing difficulty    Past Surgical History:  Procedure Laterality Date   ANTERIOR CERVICAL DECOMP/DISCECTOMY FUSION N/A 07/14/2016   Procedure: Cervical five-six5- Cervical six-seven Anterior cervical decompression/discectomy/fusion;  Surgeon: Manya Sells, MD;  Location: [facility] OR;  Service: Neurosurgery;  Laterality: N/A;   BRONCHIAL WASHINGS  12/27/2021   Procedure: BRONCHIAL WASHINGS;  Surgeon: Mannam, Praveen, MD;  Location: [facility];  Service: Cardiopulmonary;;   COLON SURGERY     COLONOSCOPY  12/22/2014   per Dr. Andriette Keeling, clear, repeat in 5 yrs (hx of adenomas)    DG THUMB LEFT HAND     DG THUMB RIGHT HAND ([facility] HX)     FUNCTIONAL ENDOSCOPIC SINUS SURGERY     PARTIAL COLECTOMY     sigmoid (12in), Dr. Andriette Keeling   VIDEO BRONCHOSCOPY N/A 12/27/2021   Procedure: VIDEO BRONCHOSCOPY WITHOUT FLUORO;  Surgeon: Mannam, Praveen, MD;  Location: [facility];  Service: Cardiopulmonary;  Laterality: N/A;   Patient Active Problem List   Diagnosis Date Noted   COVID-19 virus infection 06/09/2023   BMI 34.0-34.9,adult 06/02/2022   Other fatigue 04/28/2022   SOBOE (shortness of breath on exertion) 04/28/2022   Essential  hypertension 04/06/2022   Environmental and seasonal allergies 04/05/2022   Lung nodule 01/03/2022   Lobar pneumonia (HCC) 12/24/2021   Type 2 diabetes mellitus with obesity (HCC) 09/21/2021   Hyperlipidemia 09/21/2021   Class 2 severe obesity with serious comorbidity and body mass index (BMI) of 35.0 to 35.9 in adult [facility]) 09/21/2021   Asthma 12/29/2020   Arthralgia of left temporomandibular joint 06/09/2020   Vitamin D  deficiency 04/17/2018   Generalized obesity 12/21/2017   Type 2 diabetes mellitus without complications (HCC) 07/07/2017   Chronic sinusitis 08/30/2016   Nasal polyposis 08/30/2016   Herniated cervical disc without myelopathy 07/14/2016   Meralgia paresthetica of right side 07/13/2015   BMI 35.0-35.9,adult 09/19/2012   GERD 09/09/2009   ABDOMINAL PAIN 09/09/2009    PCP: Donley Furth, MD  REFERRING PROVIDER: Ulysees Gander, DO  REFERRING DIAG: Right shoulder pain, unspecified chronicity  THERAPY DIAG:  Chronic right shoulder pain  Muscle weakness (generalized)  Rationale for Evaluation and Treatment: Rehabilitation  ONSET DATE: Chronic, about 2 months   SUBJECTIVE:           SUBJECTIVE STATEMENT: Patient reports his shoulder is doing pretty good. Still states if he lays on that side for too long it will bother it.   Eval: Patient reports right  shoulder pain and had a shot that helped tremendously. Since the injection he does have some pain if he over-does it like when waxing his car. Currently he is feeling good today. His main issue is trying to reach behind his back.  Hand dominance: Right  PERTINENT HISTORY: See PMH above  PAIN:  Are you having pain? Yes:  NPRS scale: 0/10 currently, 2-3/10 at worst Pain location: Right shoulder Pain description: Weak, ache Aggravating factors: Lying on right shoulder, activity using the right shoulder Relieving factors: Rest, medication  PRECAUTIONS: None  PATIENT GOALS: Improve shoulder strength and  activity tolerance   OBJECTIVE:  Note: Objective measures were completed at Evaluation unless otherwise noted. PATIENT SURVEYS:  Quick Dash 13.6% disability  07/18/2023: 2.3%  POSTURE: Rounded shoulder posture  UPPER EXTREMITY ROM:   Active ROM Right eval Left eval Right 07/04/2023 Right 07/18/2023  Shoulder flexion 150 155 155   Shoulder extension      Shoulder abduction 150 160    Shoulder adduction      Shoulder internal rotation T11 T7  T10  Shoulder external rotation 65 / T4 60 / T4    Elbow flexion      Elbow extension      Wrist flexion      Wrist extension      Wrist ulnar deviation      Wrist radial deviation      Wrist pronation      Wrist supination      (Blank rows = not tested)  UPPER EXTREMITY MMT:  MMT Right eval Left eval Right 07/18/2023  Shoulder flexion 4 5 5   Shoulder extension     Shoulder abduction 5 5 5   Shoulder adduction     Shoulder internal rotation 5 5 5   Shoulder external rotation 5 5 5   Middle trapezius     Lower trapezius     Elbow flexion 5 5   Elbow extension 5 5   Wrist flexion     Wrist extension     Wrist ulnar deviation     Wrist radial deviation     Wrist pronation     Wrist supination     Grip strength (lbs)     (Blank rows = not tested)  JOINT MOBILITY TESTING:  Slight hypomobility right shoulder compared to left  PALPATION:  Tender to palpation at right bicipital groove region                                                                                                                             TREATMENT  [facility] Adult PT Treatment:                                                DATE: 07/18/2023 UBE L3 x 4 min (fwd/bwd) to improve endurance and workload capacity  Corner pec stretch 3 x 20 sec Shoulder ER with L1 powerband 3 x 15 Bent over row supported on table with 30# 3 x 10 Standing scaption with 3# x 12, 4# 2 x 12 Standing chest pull with L2 powerband 2 x 10 Seated overhead shoulder press with 8# 2 x  10  PATIENT EDUCATION: Education details: HEP Person educated: Patient Education method: Explanation Education comprehension: verbalized understanding  HOME EXERCISE PROGRAM: Access Code: [email or web address]    ASSESSMENT: CLINICAL IMPRESSION: Patient tolerated therapy well with no adverse effects. Overall he reports great improvement in his functional ability and demonstrates improved right shoulder motion and strength. He does continue to exhibit some limitations reaching behind his back and mild pain with lying on the right shoulder. Therapy continued to focus on strength progressions. No changes made to his HEP this visit. Patient will be formally discharged from PT this visit as he is pleased with his current functional status.   Eval: Patient is a 63 y.o. male who was seen today for physical therapy evaluation and treatment for chronic right shoulder pain and weakness. He seems to be doing very well since his shoulder injection but does still exhibit slight limitations in right shoulder motion and strength that are likely contributing to pain with activity and impacting his functional ability.  OBJECTIVE IMPAIRMENTS: decreased activity tolerance, decreased ROM, decreased strength, and pain.   ACTIVITY LIMITATIONS: lifting, reach over head, and hygiene/grooming  PARTICIPATION LIMITATIONS: cleaning and community activity  PERSONAL FACTORS: Past/current experiences and Time since onset of injury/illness/exacerbation are also affecting patient's functional outcome.    GOALS: Goals reviewed with patient? Yes  SHORT TERM GOALS: Target date: 07/11/2023  Patient will be I with initial HEP in order to progress with therapy. Baseline: HEP provided at eval 07/18/2023: independent Goal status: MET  2.  Patient will report right shoulder pain </= 1/10 with all activity to reduce functional limitations Baseline: 2-3/10 07/18/2023: 2-3/10 when lying on right side or extended periods Goal status:  NOT MET  LONG TERM GOALS: Target date: 08/08/2023  Patient will be I with final HEP to maintain progress from PT. Baseline: HEP provided at eval 07/18/2023: independent Goal status: MET  2.  Patient will report QuickDASH </= 5% in order to indicate an improvement in their functional status Baseline: 13.6% disability 07/18/2023:2.3% Goal status: MET  3.  Patient will demonstrate right shoulder strength 5/5 MMT to improve activity tolerance and return to gym exercises without pain or limitation Baseline: see limitation above 07/18/2023: 5/5 MMT Goal status: MET  4.  Patient will improve functional IR reach behind back to >/= T8 to improve bathing and grooming tasks Baseline: reach to T11 07/18/2023: reach to T10 Goal status: PARTIALLY MET   PLAN: PT FREQUENCY: 1x/week  PT DURATION: 8 weeks  PLANNED INTERVENTIONS: 97164- PT Re-evaluation, 97110-Therapeutic exercises, 97530- Therapeutic activity, 97112- Neuromuscular re-education, 97535- Self Care, 81191- Manual therapy, Patient/Family education, Taping, Dry Needling, Joint mobilization, Joint manipulation, Cryotherapy, and Moist heat  PLAN FOR NEXT SESSION: NA - discharge    Leah Primus, PT, DPT, LAT, ATC 07/18/23  12:25 PM Phone: [phone] Fax: [phone]   PHYSICAL THERAPY DISCHARGE SUMMARY  Visits from Start of Care: 4  Current functional level related to goals / functional outcomes: See above   Remaining deficits: See above   Education / Equipment: See above   Patient agrees to discharge. Patient goals were partially met. Patient is being discharged due to being pleased with the current functional level.

## 2023-07-19 ENCOUNTER — Ambulatory Visit: Admitting: Bariatrics

## 2023-07-19 ENCOUNTER — Encounter: Payer: Self-pay | Admitting: Bariatrics

## 2023-07-19 VITALS — BP 135/74 | HR 92 | Temp 98.0°F | Ht 70.0 in | Wt 240.0 lb

## 2023-07-19 DIAGNOSIS — E66811 Obesity, class 1: Secondary | ICD-10-CM

## 2023-07-19 DIAGNOSIS — Z6834 Body mass index (BMI) 34.0-34.9, adult: Secondary | ICD-10-CM | POA: Diagnosis not present

## 2023-07-19 DIAGNOSIS — E559 Vitamin D deficiency, unspecified: Secondary | ICD-10-CM

## 2023-07-19 DIAGNOSIS — E119 Type 2 diabetes mellitus without complications: Secondary | ICD-10-CM

## 2023-07-19 DIAGNOSIS — E669 Obesity, unspecified: Secondary | ICD-10-CM

## 2023-07-19 DIAGNOSIS — E1169 Type 2 diabetes mellitus with other specified complication: Secondary | ICD-10-CM

## 2023-07-19 DIAGNOSIS — Z7985 Long-term (current) use of injectable non-insulin antidiabetic drugs: Secondary | ICD-10-CM

## 2023-07-19 MED ORDER — ONDANSETRON HCL 4 MG PO TABS: 4.0000 mg | ORAL_TABLET | Freq: Three times a day (TID) | ORAL | 0 refills | Status: DC | PRN

## 2023-07-19 MED ORDER — VITAMIN D (ERGOCALCIFEROL) 1.25 MG (50000 UNIT) PO CAPS: ORAL_CAPSULE | ORAL | 0 refills | Status: DC

## 2023-07-19 MED ORDER — TIRZEPATIDE 5 MG/0.5ML ~~LOC~~ SOAJ: 5.0000 mg | SUBCUTANEOUS | 0 refills | Status: DC

## 2023-07-19 NOTE — Progress Notes (Signed)
 WEIGHT SUMMARY AND BIOMETRICS  Weight Lost Since Last Visit: 1lb  Weight Gained Since Last Visit: 0   Vitals Temp: 98 F (36.7 C) BP: 135/74 Pulse Rate: 92 SpO2: 98 %   Anthropometric Measurements Height: 5' 10 (1.778 m) Weight: 240 lb (108.9 kg) BMI (Calculated): 34.44 Weight at Last Visit: 241lb Weight Lost Since Last Visit: 1lb Weight Gained Since Last Visit: 0 Starting Weight: 249lb Total Weight Loss (lbs): 8 lb (3.629 kg)   Body Composition  Body Fat %: 32 % Fat Mass (lbs): 77 lbs Muscle Mass (lbs): 155.6 lbs Total Body Water (lbs): 114 lbs Visceral Fat Rating : 19   Other Clinical Data Fasting: no Labs: no Today's Visit #: 22 Starting Date: 04/14/21    OBESITY [place] is here to discuss his progress with his obesity treatment plan along with follow-up of his obesity related diagnoses.    Nutrition Plan: low carbohydrate plan - 90% adherence.  Current exercise: Stretches, walking, resistance   Interim History:  He is down 1 additional pound.  He has gotten off to a slow start secondary to being on vacation.  He has been following the principles of the diet. Eating all of the food on the plan., Protein intake is as prescribed, Is not skipping meals, and Water intake is adequate.   Pharmacotherapy: Marsalis is on Mounjaro  2.5 mg SQ weekly Adverse side effects: Nausea, occasional he has been using ginger for relief Hunger is moderately controlled.  Cravings are moderately controlled.  Assessment/Plan:   Vitamin D  Deficiency Vitamin D  is at goal of 50.  Most recent vitamin D  level was 55.5. He is on  prescription ergocalciferol  50,000 IU weekly. Lab Results  Component Value Date   VD25OH 55.5 09/21/2021   VD25OH 43.6 04/14/2021   VD25OH 56.1 02/12/2018    Plan: Continue prescription vitamin D  50,000 IU weekly.   Type II  Diabetes HgbA1c is at goal. Last A1c was 6.4 Episodes of hypoglycemia: no Medication(s): Mounjaro  2.5 mg SQ weekly  Lab Results  Component Value Date   HGBA1C 6.4 03/07/2023   HGBA1C 6.3 (H) 12/24/2021   HGBA1C 5.5 09/21/2021   Lab Results  Component Value Date   LDLCALC 129 (H) 03/07/2023   CREATININE 0.96 03/07/2023   Lab Results  Component Value Date   GFR 83.58 03/07/2023   GFR 70.46 02/24/2021   GFR 76.32 10/19/2020    Plan: Continue and refill Mounjaro  5.0 mg SQ weekly Continue all other medications.  Will keep all carbohydrates low both sweets and starches.  Will continue exercise regimen to 30 to 60 minutes on most days of the week.  Aim for 7 to 9 hours of sleep nightly.  Eat more low glycemic index foods.  Rx: Zofran  4 mg po every 8 hours as needed #30 with no refills. He will continue to use ginger and/or chamomile for his  nausea.   Generalized Obesity: Current BMI BMI (Calculated): 34.44   Pharmacotherapy Plan Continue and increase dose  Mounjaro  5.0 mg SQ weekly  Juanito is currently in the action stage of change. As such, his goal is to continue with weight loss efforts.  He has agreed to low carbohydrate plan.  Exercise goals: For substantial health benefits, adults should do at least 150 minutes (2 hours and 30 minutes) a week of moderate-intensity, or 75 minutes (1 hour and 15 minutes) a week of vigorous-intensity aerobic physical activity, or an equivalent combination of moderate- and vigorous-intensity aerobic activity. Aerobic activity should be performed in episodes of at least 10 minutes, and preferably, it should be spread throughout the week.  Behavioral modification strategies: increasing lean protein intake, decreasing simple carbohydrates , no meal skipping, meal planning , better snacking choices, planning for success, increasing vegetables, avoiding temptations, keep healthy foods in the home, and mindful eating.  Ansley has agreed to  follow-up with our clinic in 4 weeks.      Objective:   VITALS: Per patient if applicable, see vitals. GENERAL: Alert and in no acute distress. CARDIOPULMONARY: No increased WOB. Speaking in clear sentences.  PSYCH: Pleasant and cooperative. Speech normal rate and rhythm. Affect is appropriate. Insight and judgement are appropriate. Attention is focused, linear, and appropriate.  NEURO: Oriented as arrived to appointment on time with no prompting.   Attestation Statements:   This was prepared with the assistance of Engineer, civil (consulting).  Occasional wrong-word or sound-a-like substitutions may have occurred due to the inherent limitations of voice recognition   Kirk Peper, DO

## 2023-07-24 ENCOUNTER — Other Ambulatory Visit: Payer: Self-pay | Admitting: Bariatrics

## 2023-07-25 ENCOUNTER — Telehealth: Payer: Self-pay | Admitting: Bariatrics

## 2023-07-25 ENCOUNTER — Encounter: Payer: Self-pay | Admitting: Bariatrics

## 2023-07-25 NOTE — Telephone Encounter (Signed)
 Patient would like a call back regarding his Mounjaro . Thanks!

## 2023-08-01 ENCOUNTER — Other Ambulatory Visit: Payer: Self-pay | Admitting: Family Medicine

## 2023-08-17 ENCOUNTER — Ambulatory Visit: Admitting: Bariatrics

## 2023-08-25 ENCOUNTER — Telehealth: Payer: Self-pay | Admitting: Nurse Practitioner

## 2023-08-25 ENCOUNTER — Telehealth: Admitting: Adult Health

## 2023-08-25 ENCOUNTER — Ambulatory Visit: Payer: Self-pay

## 2023-08-25 ENCOUNTER — Encounter: Payer: Self-pay | Admitting: Adult Health

## 2023-08-25 DIAGNOSIS — R197 Diarrhea, unspecified: Secondary | ICD-10-CM

## 2023-08-25 MED ORDER — HYOSCYAMINE SULFATE 0.125 MG PO TABS: 0.1250 mg | ORAL_TABLET | ORAL | 0 refills | Status: DC | PRN

## 2023-08-25 NOTE — Progress Notes (Signed)
 Virtual Visit via Video Note  I connected with Thomas [name] on 08/25/23 at  1:00 PM EDT by a video enabled telemedicine application and verified that I am speaking with the correct person using two identifiers.  Location patient: home Location provider:work or home office Persons participating in the virtual visit: patient, provider  I discussed the limitations of evaluation and management by telemedicine and the availability of in person appointments. The patient expressed understanding and agreed to proceed.   HPI: 63 year old male who is being evaluated today for an acute issue.  He reports having diarrhea for 3 days and reports that in the last 24 hours the number of bowel movements has been in the teens.  He has been staying hydrated to mitigate dehydration.  He denies any suspicious foods and has not experienced vomiting, fever, chills, or blood in his stool.  His stools pretty watery and brown.  When his symptoms first started he had left lower quadrant pain but the pain has resolved.  He has not been on any antibiotics recently.  Does have a history of diverticulitis and reports that this does not feel like an exacerbation.  Also has history of partial colectomy but does not regularly have diarrhea.  At home he has not used any over-the-counter antidiarrheals.   ROS: See pertinent positives and negatives per HPI.  Past Medical History:  Diagnosis Date   Allergy    Asthma    Back pain    COVID-18 Nov 2021   Diabetes [facility])    GERD (gastroesophageal reflux disease)    History of hiatal hernia    Nasal polyposis    Pneumonia    5 -7 yrs ago   Swallowing difficulty     Past Surgical History:  Procedure Laterality Date   ANTERIOR CERVICAL DECOMP/DISCECTOMY FUSION N/A 07/14/2016   Procedure: Cervical five-six5- Cervical six-seven Anterior cervical decompression/discectomy/fusion;  Surgeon: Unice Pac, MD;  Location: [facility] OR;  Service: Neurosurgery;  Laterality: N/A;    BRONCHIAL WASHINGS  12/27/2021   Procedure: BRONCHIAL WASHINGS;  Surgeon: Mannam, Praveen, MD;  Location: [facility];  Service: Cardiopulmonary;;   COLON SURGERY     COLONOSCOPY  12/22/2014   per Dr. Luis, clear, repeat in 5 yrs (hx of adenomas)    DG THUMB LEFT HAND     DG THUMB RIGHT HAND ([facility] HX)     FUNCTIONAL ENDOSCOPIC SINUS SURGERY     PARTIAL COLECTOMY     sigmoid (12in), Dr. Luis   VIDEO BRONCHOSCOPY N/A 12/27/2021   Procedure: VIDEO BRONCHOSCOPY WITHOUT FLUORO;  Surgeon: Mannam, Praveen, MD;  Location: [facility];  Service: Cardiopulmonary;  Laterality: N/A;    Family History  Problem Relation Age of Onset   Cancer Mother    Cancer Father    Hyperlipidemia Father    Diabetes Other        maternal family       Current Outpatient Medications:    albuterol  (PROVENTIL ) (2.5 MG/3ML) 0.083% nebulizer solution, Take 3 mLs (2.5 mg total) by nebulization every 4 (four) hours as needed for wheezing or shortness of breath., Disp: 75 mL, Rfl: 12   albuterol  (VENTOLIN  HFA) 108 (90 Base) MCG/ACT inhaler, INHALE 2 PUFFS EVERY 6 HOURS AS NEEDED FOR WHEEZING OR SHORTNESS OF BREATH., Disp: 54 each, Rfl: 0   azithromycin  (ZITHROMAX ) 250 MG tablet, Take 2 tablets on day one then take 1 tablet daily for four additional days, Disp: 6 tablet, Rfl: 0   benzonatate  (TESSALON ) 200 MG  capsule, Take 1 capsule (200 mg total) by mouth 3 (three) times daily as needed for cough., Disp: 30 capsule, Rfl: 1   budesonide -formoterol  (SYMBICORT ) 160-4.5 MCG/ACT inhaler, Inhale 2 puffs into the lungs 2 (two) times daily., Disp: 30.6 g, Rfl: 3   celecoxib  (CELEBREX ) 200 MG capsule, TAKE 1 CAPSULE BY MOUTH TWICE A DAY AS NEEDED, Disp: 60 capsule, Rfl: 2   fluticasone  (FLONASE ) 50 MCG/ACT nasal spray, SPRAY 2 SPRAYS INTO EACH NOSTRIL EVERY DAY, Disp: 48 mL, Rfl: 0   HYDROcodone  bit-homatropine (HYCODAN) 5-1.5 MG/5ML syrup, Take 5 mLs by mouth every 6 (six) hours as needed for cough., Disp: 240 mL, Rfl:  0   ketoconazole  (NIZORAL ) 2 % cream, Apply 1 Application topically 2 (two) times daily., Disp: 60 g, Rfl: 3   lansoprazole  (PREVACID ) 15 MG capsule, TAKE 1 CAPSULE BY MOUTH EVERY DAY, Disp: 90 capsule, Rfl: 1   methocarbamol  (ROBAXIN ) 500 MG tablet, Take 1 tablet (500 mg total) by mouth every 6 (six) hours as needed for muscle spasms., Disp: 60 tablet, Rfl: 5   metroNIDAZOLE  (FLAGYL ) 500 MG tablet, Take 1 tablet (500 mg total) by mouth 3 (three) times daily., Disp: 30 tablet, Rfl: 0   montelukast  (SINGULAIR ) 10 MG tablet, Take 1 tablet (10 mg total) by mouth daily., Disp: 90 tablet, Rfl: 3   nystatin  (MYCOSTATIN ) 100000 UNIT/ML suspension, Take 5 mLs (500,000 Units total) by mouth 4 (four) times daily., Disp: 473 mL, Rfl: 0   ondansetron  (ZOFRAN ) 4 MG tablet, Take 1 tablet (4 mg total) by mouth every 8 (eight) hours as needed., Disp: 30 tablet, Rfl: 0   predniSONE  (DELTASONE ) 10 MG tablet, 3 tabs for 2 days, 2 tabs for 2 days, then 1 tab for 2 days, then stop, Disp: 12 tablet, Rfl: 0   tirzepatide  (MOUNJARO ) 5 MG/0.5ML Pen, INJECT 5 MG SUBCUTANEOUSLY WEEKLY, Disp: 6 mL, Rfl: 0   triamcinolone  cream (KENALOG ) 0.1 %, Apply 1 Application topically 2 (two) times daily as needed (rash)., Disp: 45 g, Rfl: 2   Vitamin D , Ergocalciferol , (DRISDOL ) 1.25 MG (50000 UNIT) CAPS capsule, TAKE 1 CAPSULE (50,000 UNITS TOTAL) BY MOUTH EVERY 14 DAYS, Disp: 6 capsule, Rfl: 0  EXAM:  VITALS per patient if applicable:  GENERAL: alert, oriented, appears well and in no acute distress  HEENT: atraumatic, conjunttiva clear, no obvious abnormalities on inspection of external nose and ears  NECK: normal movements of the head and neck  LUNGS: on inspection no signs of respiratory distress, breathing rate appears normal, no obvious gross SOB, gasping or wheezing  CV: no obvious cyanosis  MS: moves all visible extremities without noticeable abnormality  PSYCH/NEURO: pleasant and cooperative, no obvious depression or  anxiety, speech and thought processing grossly intact  ASSESSMENT AND PLAN:  Discussed the following assessment and plan:  1. Diarrhea, unspecified type (Primary) - Does not appear to be diverticulitis, C. difficile, or infectious diarrhea.  This is likely self-limiting, I will have him start with Imodium therapy, take 1 tablet and then can take half tablet in 4 to 6 hours as needed and can continue this until he has a formed bowel movement then stop Imodium.  I will send in a prescription for high Cosamin to use over the weekend if Imodium does not work.  He does have an appointment early next week with his PCP.  Advised to stay hydrated with water and electrolytes  - hyoscyamine  (LEVSIN ) 0.125 MG tablet; Take 1 tablet (0.125 mg total) by mouth every 4 (four)  hours as needed.  Dispense: 30 tablet; Refill: 0      I discussed the assessment and treatment plan with the patient. The patient was provided an opportunity to ask questions and all were answered. The patient agreed with the plan and demonstrated an understanding of the instructions.   The patient was advised to call back or seek an in-person evaluation if the symptoms worsen or if the condition fails to improve as anticipated.   Darleene Shape, NP   Time spent with patient today was 31 minutes which consisted of chart review, discussing diarrhea, work up, treatment answering questions and documentation.

## 2023-08-25 NOTE — Telephone Encounter (Signed)
 Patient called and stated that he has been struggling with diarrhea for a while now. Patient is requesting to be seen today if possible. Patient is requesting a call back. Please advise.

## 2023-08-25 NOTE — Telephone Encounter (Signed)
 Returned call to patient & he has been experiencing loose, watery diarrhea for the last 3 days (up to 15/day) and nausea w/o vomiting. Previously had some LLQ pain when it first started, but it has resolved. He's not been around anyone sick that he knows of, no recent abx use. He would like to be seen by Dr. Charlanne & was offered OV next week, however unable to make that appt and declines to see an APP for sooner eval. He does have video visit with PCP office today. Advised him in the meantime to hydrate well & can use imodium OTC. He's requesting for Dr. Charlanne to review & to also advise on colonoscopy date. Per OV note, not due until 2030, but patient thinks he is due this year. Last OV 12/2022 with Vina, NP.

## 2023-08-25 NOTE — Telephone Encounter (Signed)
 Pt is scheduled for a VV with Thomas [name] this afternoon for this problem

## 2023-08-25 NOTE — Telephone Encounter (Signed)
 FYI Only or Action Required?: FYI only for provider.  Patient was last seen in primary care on 07/19/2023 by Delores Shields A, DO.  Called Nurse Triage reporting Diarrhea.  Symptoms began x 3 days ago.  Interventions attempted: OTC medications: Pepto Bismul.  Symptoms are: gradually worsening.  Triage Disposition: See HCP Within 4 Hours (Or PCP Triage)  Patient/caregiver understands and will follow disposition?: No, wishes to speak with PCP  **See note below**           Message from Franky GRADE sent at 08/25/2023 10:23 AM EDT  Patient is calling because he has been experiencing bad case of diarrhea for the last 3 days.   Reason for Disposition  [1] SEVERE diarrhea (e.g., 7 or more times / day more than normal) AND [2] age > 60 years  Answer Assessment - Initial Assessment Questions 1. DIARRHEA SEVERITY: How bad is the diarrhea? How many more stools have you had in the past 24 hours than normal?      Severe, in the teens. Unable to count  2. ONSET: When did the diarrhea begin?      X 3 days   3. STOOL DESCRIPTION:  How loose or watery is the diarrhea? What is the stool color? Is there any blood or mucous in the stool?      Both loose and watery   4. VOMITING: Are you also vomiting? If Yes, ask: How many times in the past 24 hours?      No   5. ABDOMEN PAIN: Are you having any abdomen pain? If Yes, ask: What does it feel like? (e.g., crampy, dull, intermittent, constant)      No   7. ORAL INTAKE: If vomiting, Have you been able to drink liquids? How much liquids have you had in the past 24 hours?     Yes  8. HYDRATION: Any signs of dehydration? (e.g., dry mouth [not just dry lips], too weak to stand, dizziness, new weight loss) When did you last urinate?     Mild weakness noted  9. EXPOSURE: Have you traveled to a foreign country recently? Have you been exposed to anyone with diarrhea? Could you have eaten any food that was  spoiled?  No       10. ANTIBIOTIC USE: Are you taking antibiotics now or have you taken antibiotics in the past 2 months?  No         11. OTHER SYMPTOMS: Do you have any other symptoms? (e.g., fever, blood in stool)   No blood in stool, reports weakness, and possible dehydration. He is taking Pepto-bismol for symptoms. Patient stated he would try Imodium and if that does not help he will seek care. Patient advised due to the risk of dehydration along with the severity of the diarrhea, he needs to be seen in the UC/ED. He stated they will not do anything for him there. He placed a call to his GI provider, and is awaiting a call back for that office. Patient insisted on an appointment with Dr. Johnny, he is scheduled for 7/30.  Protocols used: [facility]

## 2023-08-29 NOTE — Telephone Encounter (Signed)
 Pt has seen PCP for diarrhea.  - Please check stool for GI pathogens (including C. Difficile).  He has been on azithromycin  previously.  Even azithromycin  could have made his diarrhea worse. -Please make sure he is not on any supplements including magnesium . -If stool studies are negative, proceed with colonoscopy with MiraLAX  prep (need to hold Mounjaro  7 days prior)  RG

## 2023-08-30 ENCOUNTER — Other Ambulatory Visit: Payer: Self-pay

## 2023-08-30 ENCOUNTER — Encounter: Payer: Self-pay | Admitting: Family Medicine

## 2023-08-30 ENCOUNTER — Ambulatory Visit: Admitting: Family Medicine

## 2023-08-30 VITALS — BP 110/60 | HR 99 | Temp 98.3°F | Wt 245.0 lb

## 2023-08-30 DIAGNOSIS — K5732 Diverticulitis of large intestine without perforation or abscess without bleeding: Secondary | ICD-10-CM

## 2023-08-30 DIAGNOSIS — R197 Diarrhea, unspecified: Secondary | ICD-10-CM

## 2023-08-30 MED ORDER — TEMAZEPAM 30 MG PO CAPS: 30.0000 mg | ORAL_CAPSULE | Freq: Every evening | ORAL | 0 refills | Status: AC | PRN

## 2023-08-30 MED ORDER — METRONIDAZOLE 500 MG PO TABS: 500.0000 mg | ORAL_TABLET | Freq: Three times a day (TID) | ORAL | 0 refills | Status: DC

## 2023-08-30 MED ORDER — CIPROFLOXACIN HCL 500 MG PO TABS
500.0000 mg | ORAL_TABLET | Freq: Two times a day (BID) | ORAL | 0 refills | Status: AC
Start: 2023-08-30 — End: 2023-09-09

## 2023-08-30 NOTE — Progress Notes (Signed)
   Subjective:    Patient ID: Thomas [name], male    DOB: December 22, 1960, 63 y.o.   MRN: [id number]  HPI Here for what is likely to be another flare of diverticulitis. His last flare was in February. About 5 days ago he developed diarrhea that has persisted since then. He did not have abdominal pains at first. He has never had a fever. He had a video visit on 08-25-23, and he was given Hyoscyamine  to take along with Imodium. He says these have slowed the diarrhea down a little, but he is still passing liquid stool. No red or black stools. He has also developed LLQ pains in the past 2 days which he says feel like his diverticulitis pains. He is drinking plenty of fluids. He does not take any supplements that contain magneisum. No recent travel.    Review of Systems  Constitutional: Negative.   Respiratory: Negative.    Cardiovascular: Negative.   Gastrointestinal:  Positive for abdominal pain and diarrhea. Negative for abdominal distention, blood in stool, constipation, nausea, rectal pain and vomiting.  Genitourinary: Negative.        Objective:   Physical Exam Constitutional:      Comments: He looks to be uncomfortable   Cardiovascular:     Rate and Rhythm: Normal rate and regular rhythm.     Pulses: Normal pulses.     Heart sounds: Normal heart sounds.  Pulmonary:     Effort: Pulmonary effort is normal.     Breath sounds: Normal breath sounds.  Abdominal:     General: Abdomen is flat. Bowel sounds are normal. There is no distension.     Palpations: Abdomen is soft. There is no mass.     Tenderness: There is no right CVA tenderness, left CVA tenderness, guarding or rebound.     Hernia: No hernia is present.     Comments: He is tender in the left flank and LLQ  Neurological:     Mental Status: He is alert.           Assessment & Plan:  LLQ pain and diarrhea consistent with diverticulitis. We will send him to the lab today to obtain stools for a culture and for C diff testing.  He will begin taking 10 days of Cipro  and Metronidazole . We will arrange for him to see Dr. Charlanne in GI sometime soon. Garnette Olmsted, MD

## 2023-08-31 ENCOUNTER — Ambulatory Visit (INDEPENDENT_AMBULATORY_CARE_PROVIDER_SITE_OTHER)

## 2023-08-31 ENCOUNTER — Ambulatory Visit: Admitting: Podiatry

## 2023-08-31 ENCOUNTER — Ambulatory Visit: Admitting: Bariatrics

## 2023-08-31 ENCOUNTER — Encounter: Payer: Self-pay | Admitting: Bariatrics

## 2023-08-31 VITALS — Ht 70.0 in | Wt 234.0 lb

## 2023-08-31 VITALS — BP 137/81 | HR 87 | Temp 97.8°F | Ht 70.0 in | Wt 234.0 lb

## 2023-08-31 DIAGNOSIS — E559 Vitamin D deficiency, unspecified: Secondary | ICD-10-CM | POA: Diagnosis not present

## 2023-08-31 DIAGNOSIS — S92534A Nondisplaced fracture of distal phalanx of right lesser toe(s), initial encounter for closed fracture: Secondary | ICD-10-CM | POA: Diagnosis not present

## 2023-08-31 DIAGNOSIS — M7751 Other enthesopathy of right foot: Secondary | ICD-10-CM

## 2023-08-31 DIAGNOSIS — E669 Obesity, unspecified: Secondary | ICD-10-CM

## 2023-08-31 DIAGNOSIS — E66811 Obesity, class 1: Secondary | ICD-10-CM

## 2023-08-31 DIAGNOSIS — Z6833 Body mass index (BMI) 33.0-33.9, adult: Secondary | ICD-10-CM | POA: Diagnosis not present

## 2023-08-31 DIAGNOSIS — E1169 Type 2 diabetes mellitus with other specified complication: Secondary | ICD-10-CM | POA: Diagnosis not present

## 2023-08-31 DIAGNOSIS — Z7985 Long-term (current) use of injectable non-insulin antidiabetic drugs: Secondary | ICD-10-CM

## 2023-08-31 MED ORDER — MOUNJARO 5 MG/0.5ML ~~LOC~~ SOAJ: SUBCUTANEOUS | 0 refills | Status: DC

## 2023-08-31 MED ORDER — ONDANSETRON HCL 4 MG PO TABS: 4.0000 mg | ORAL_TABLET | Freq: Three times a day (TID) | ORAL | 0 refills | Status: DC | PRN

## 2023-08-31 MED ORDER — VITAMIN D (ERGOCALCIFEROL) 1.25 MG (50000 UNIT) PO CAPS: ORAL_CAPSULE | ORAL | 0 refills | Status: DC

## 2023-08-31 NOTE — Progress Notes (Signed)
 WEIGHT SUMMARY AND BIOMETRICS  Weight Lost Since Last Visit: 6lb  Weight Gained Since Last Visit: 0   Vitals Temp: 97.8 F (36.6 C) BP: 137/81 Pulse Rate: 87 SpO2: 99 %   Anthropometric Measurements Height: 5' 10 (1.778 m) Weight: 234 lb (106.1 kg) BMI (Calculated): 33.58 Weight at Last Visit: 240lb Weight Lost Since Last Visit: 6lb Weight Gained Since Last Visit: 0 Starting Weight: 249lb Total Weight Loss (lbs): 14 lb (6.35 kg)   Body Composition  Body Fat %: 31 % Fat Mass (lbs): 72.8 lbs Muscle Mass (lbs): 153.8 lbs Total Body Water (lbs): 109.6 lbs Visceral Fat Rating : 18   Other Clinical Data Fasting: no Labs: no Today's Visit #: 23 Starting Date: 04/14/21    OBESITY Thomas [name] is here to discuss his progress with his obesity treatment plan along with follow-up of his obesity related diagnoses.    Nutrition Plan: low carbohydrate plan - 90% adherence.  Current exercise: walking and resistance bands Interim History:  He is down 6 lbs since his last visit.  Protein intake is as prescribed, Is not skipping meals, Meeting protein goals., and Denies polyphagia   Pharmacotherapy: Thomas [name] is on Mounjaro  5.0 mg SQ weekly Adverse side effects: None He had some diarrhea initially.  Hunger is moderately controlled.  Cravings are moderately controlled.  Assessment/Plan:   Type II Diabetes HgbA1c is at goal. Last A1c was 6.4 Episodes of hypoglycemia: no Medication(s): Mounjaro  5.0 mg SQ weekly  Lab Results  Component Value Date   HGBA1C 6.4 03/07/2023   HGBA1C 6.3 (H) 12/24/2021   HGBA1C 5.5 09/21/2021   Lab Results  Component Value Date   LDLCALC 129 (H) 03/07/2023   CREATININE 0.96 03/07/2023   Lab Results  Component Value Date   GFR 83.58 03/07/2023   GFR 70.46 02/24/2021   GFR 76.32 10/19/2020    Plan: Continue and refill  Mounjaro  5.0 mg SQ weekly Continue all other medications.  Will keep all carbohydrates low both sweets and starches.  Will continue exercise regimen to 30 to 60 minutes on most days of the week.  Protein options sheet.  Will continue exercise.  Zofran  4 mg 1 every 8 hours as needed #30 with no refills.    Vitamin D  Deficiency Vitamin D  is at goal of 50.  Most recent vitamin D  level was 55.5. He is on  prescription ergocalciferol  50,000 IU weekly. Lab Results  Component Value Date   VD25OH 55.5 09/21/2021   VD25OH 43.6 04/14/2021   VD25OH 56.1 02/12/2018    Plan: Refill prescription vitamin D  50,000 IU weekly.      Generalized Obesity: Current BMI BMI (Calculated): 33.58   Pharmacotherapy Plan Continue and refill  Mounjaro  5.0 mg SQ weekly  Thomas [name] is currently in the action stage of change. As such, his goal is to continue with weight loss efforts.  He has  agreed to low carbohydrate plan. With increased protein.   Exercise goals: All adults should avoid inactivity. Some physical activity is better than none, and adults who participate in any amount of physical activity gain some health benefits.  Behavioral modification strategies: increasing lean protein intake, decreasing simple carbohydrates , no meal skipping, meal planning , increase water intake, better snacking choices, planning for success, increasing vegetables, avoiding temptations, weigh protein portions, and measure portion sizes.  Thomas [name] has agreed to follow-up with our clinic in 4 weeks.    Objective:   VITALS: Per patient if applicable, see vitals. GENERAL: Alert and in no acute distress. CARDIOPULMONARY: No increased WOB. Speaking in clear sentences.  PSYCH: Pleasant and cooperative. Speech normal rate and rhythm. Affect is appropriate. Insight and judgement are appropriate. Attention is focused, linear, and appropriate.  NEURO: Oriented as arrived to appointment on time with no prompting.   Attestation  Statements:   This was prepared with the assistance of Engineer, civil (consulting).  Occasional wrong-word or sound-a-like substitutions may have occurred due to the inherent limitations of voice recognition   Clayborne Daring, DO

## 2023-08-31 NOTE — Progress Notes (Signed)
  Subjective:  Patient ID: ISSIAH HUFFAKER, male    DOB: 21-Nov-1960,  MRN: [id number]  Chief Complaint  Patient presents with   Toe Injury    Rm Patient is here for toe pain. Patient states falling four onto the concrete and striking right foot causing pain in the right hallux and index toe.     63 y.o. male presents with the above complaint. History confirmed with patient.   Objective:  Physical Exam: warm, good capillary refill, no trophic changes or ulcerative lesions, normal DP and PT pulses, normal sensory exam, and pain swelling and edema around the 2nd and 3rd MTP joint.  Hallux valgus deformity  Radiographs: Multiple views x-ray of the right foot: Possible nondisplaced fracture base of third proximal phalanx medially Assessment:   1. Closed nondisplaced fracture of distal phalanx of lesser toe of right foot, initial encounter      Plan:  Patient was evaluated and treated and all questions answered.  We reviewed his x-rays and discussed the presence of the likely fracture and bony contusion I recommended buddy taping and demonstrated this for him today compression bandage applied and dispensed.  I recommended weightbearing as tolerated in a surgical shoe this was dispensed as well.  Return in 6 weeks to reevaluate for bunion surgery planning visit.  Return in about 6 weeks (around 10/12/2023) for toe fracture f/u (new xrays if still painful) and bunion surgery consult.

## 2023-08-31 NOTE — Telephone Encounter (Signed)
 Left message for patient to call back

## 2023-08-31 NOTE — Telephone Encounter (Signed)
 Refer to mychart message for further communication with patient.

## 2023-08-31 NOTE — Telephone Encounter (Signed)
 Sent pt recommendations via mychart as well.

## 2023-09-02 LAB — CLOSTRIDIUM DIFFICILE BY PCR: Toxigenic C. Difficile by PCR: NEGATIVE

## 2023-09-04 ENCOUNTER — Ambulatory Visit: Payer: Self-pay | Admitting: Family Medicine

## 2023-09-04 LAB — STOOL CULTURE: E coli, Shiga toxin Assay: NEGATIVE

## 2023-09-04 NOTE — Telephone Encounter (Signed)
 Dr. Charlanne Pt.  Chart reviewed and noted stool studies are negative.  Pt made aware of Dr. Charlanne recommendations. Pt stated that he is not taking any magnesium  supplements.   Pt is requesting to have an EGD done along with the Colonoscopy. Pt stated that he typically has an Upper when he has his Lower.  Please review and advise

## 2023-09-05 NOTE — Telephone Encounter (Signed)
 Pt made aware of recommendations; Pt was ordered and scheduled for an EGD and Colonoscopy on 11/15/2023 at 7:00 AM with Dr. Charlanne in the [facility]. Pt made aware. Pt was scheduled for a previsit on 10/27/2023 at 11:00 AM. Pt made aware.  Pt verbalized understanding with all questions answered.

## 2023-09-07 ENCOUNTER — Other Ambulatory Visit: Payer: Self-pay | Admitting: Bariatrics

## 2023-09-07 ENCOUNTER — Other Ambulatory Visit: Payer: Self-pay | Admitting: Family Medicine

## 2023-09-07 DIAGNOSIS — B9689 Other specified bacterial agents as the cause of diseases classified elsewhere: Secondary | ICD-10-CM

## 2023-09-08 ENCOUNTER — Other Ambulatory Visit (HOSPITAL_BASED_OUTPATIENT_CLINIC_OR_DEPARTMENT_OTHER): Payer: Self-pay | Admitting: Pulmonary Disease

## 2023-09-08 MED ORDER — BUDESONIDE-FORMOTEROL FUMARATE 160-4.5 MCG/ACT IN AERO: 2.0000 | INHALATION_SPRAY | Freq: Two times a day (BID) | RESPIRATORY_TRACT | 3 refills | Status: DC

## 2023-09-08 NOTE — Telephone Encounter (Signed)
 Requesting 90 day script.

## 2023-09-08 NOTE — Telephone Encounter (Signed)
 Copied from CRM [id number]. Topic: Clinical - Medication Refill >> Sep 08, 2023  9:40 AM Joesph PARAS wrote: Medication: budesonide -formoterol  (SYMBICORT ) 160-4.5 MCG/ACT inhaler - REQUESTING 90 DAY  Has the patient contacted their pharmacy? Yes - out of refills  This is the patient's preferred pharmacy:  [facility] [phone] [address], [place] - [address] [place] [address] Phone: [phone] Fax: [phone]  Is this the correct pharmacy for this prescription? Yes  Has the prescription been filled recently? No  Is the patient out of the medication? Yes - Needs it before he goes out of town today at [employer]. States down to 10 puffs and does two daily.   Has the patient been seen for an appointment in the last year OR does the patient have an upcoming appointment? Yes  Can we respond through MyChart? Yes  Agent: Please be advised that Rx refills may take up to 3 business days. We ask that you follow-up with your pharmacy.

## 2023-09-12 ENCOUNTER — Ambulatory Visit (HOSPITAL_BASED_OUTPATIENT_CLINIC_OR_DEPARTMENT_OTHER): Admitting: Pulmonary Disease

## 2023-09-12 ENCOUNTER — Other Ambulatory Visit (HOSPITAL_BASED_OUTPATIENT_CLINIC_OR_DEPARTMENT_OTHER): Payer: Self-pay

## 2023-09-12 ENCOUNTER — Encounter (HOSPITAL_BASED_OUTPATIENT_CLINIC_OR_DEPARTMENT_OTHER): Payer: Self-pay | Admitting: Pulmonary Disease

## 2023-09-12 DIAGNOSIS — J4540 Moderate persistent asthma, uncomplicated: Secondary | ICD-10-CM | POA: Diagnosis not present

## 2023-09-12 MED ORDER — ALBUTEROL SULFATE (2.5 MG/3ML) 0.083% IN NEBU: 2.5000 mg | INHALATION_SOLUTION | RESPIRATORY_TRACT | 3 refills | Status: AC | PRN

## 2023-09-12 MED ORDER — ALBUTEROL SULFATE HFA 108 (90 BASE) MCG/ACT IN AERS: INHALATION_SPRAY | RESPIRATORY_TRACT | 1 refills | Status: AC

## 2023-09-12 NOTE — Progress Notes (Signed)
 Subjective:   PATIENT ID: Thomas [name] GENDER: male DOB: September 01, 1960, MRN: [id number]  Chief Complaint  Patient presents with   Follow-up    Asthma     Reason for Visit: Follow-up  Thomas [name] is a 63 year old male with asthma, nasal polyposis, chronic sinusitis, DM, GERD who presents for follow-up for recurrent pneumonias.  Synopsis: Has been well controlled on Symbicort  for >5 years. After switching to [facility] he feels he has had increased respiratory symptoms including recent hospitalization in 12/2021. Reviewed chart in EMR including recent pulmonary note on 01/07/22. Developed Covid 19 in 10/2021. Since then has had multiple episodes of respiratory failure with pulmonary infiltrates. Has been offered biologics however since he had stable asthma symptoms prior to this, this was avoided.  01/19/23 Since our last visit he had a URI in November and seen by his PCP. Zpack did not improve so given flagyl . Since then he is no longer coughing sputum, he reports he has been using his nebulizer 2-3 times a week. Has been compliant with his Symbicort . Having some palpitations  09/12/23 Since our last visit he was seen at [facility] by NP Cobb for asthma exacerbation secondary to covid infection, treated with prednisone . Recovered since then with wheezing sometimes at night. Occasional cough. Compliant with Symbicort  twice a day. Uses nebulizer twice a week.  Prior inhalers: Breo - double pneumonia/hospitalization. Does not tolerate powders Breztri  - ok until he can switch to Symbicort   Social History: Smoked in his 35's to age 27, approximately 1/2-1ppd, quid 1990.  Previously worked as Education officer, environmental. He drives to [facility] every weekend for work. 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  Quit in 1990 Has  adult kids in 30-40s. Five grandchildren  Past Medical History:  Diagnosis Date   Allergy    Asthma    Back pain    COVID-18 Nov 2021   Diabetes [facility])    GERD (gastroesophageal reflux disease)    History of hiatal hernia    Nasal polyposis    Pneumonia    5 -7 yrs ago   Swallowing difficulty      Family History  Problem Relation Age of Onset   Cancer Mother    Cancer Father    Hyperlipidemia Father    Diabetes Other        maternal family     Social History   Occupational History   Occupation: Education officer, environmental full time  Tobacco Use   Smoking status: Former    Current packs/day: 0.00    Average packs/day: 1 pack/day for 10.0 years (10.0 ttl pk-yrs)    Types: Cigarettes    Start date: 02/01/1976    Quit date: 01/31/1986    Years since quitting: 37.6   Smokeless tobacco: Never  Vaping Use   Vaping status: Never Used  Substance and Sexual Activity   Alcohol use: Yes    Comment: rarely   Drug use: No   Sexual activity: Not on file    Allergies  Allergen Reactions   No Known Allergies      Outpatient Medications Prior to Visit  Medication Sig Dispense Refill   benzonatate  (TESSALON ) 200 MG capsule Take 1 capsule (200 mg total) by mouth 3 (three) times daily as needed for cough. 30 capsule 1  budesonide -formoterol  (SYMBICORT ) 160-4.5 MCG/ACT inhaler Inhale 2 puffs into the lungs 2 (two) times daily. 30.6 g 3   celecoxib  (CELEBREX ) 200 MG capsule TAKE 1 CAPSULE BY MOUTH TWICE A DAY AS NEEDED 60 capsule 2   fluticasone  (FLONASE ) 50 MCG/ACT nasal spray SPRAY 2 SPRAYS INTO EACH NOSTRIL EVERY DAY 48 mL 3   HYDROcodone  bit-homatropine (HYCODAN) 5-1.5 MG/5ML syrup Take 5 mLs by mouth every 6 (six) hours as needed for cough. 240 mL 0   hyoscyamine  (LEVSIN ) 0.125 MG tablet Take 1 tablet (0.125 mg total) by mouth every 4 (four) hours as needed. 30 tablet 0   ketoconazole  (NIZORAL ) 2 % cream Apply 1 Application topically 2 (two) times daily. 60 g 3   lansoprazole  (PREVACID ) 15 MG  capsule TAKE 1 CAPSULE BY MOUTH EVERY DAY 90 capsule 1   methocarbamol  (ROBAXIN ) 500 MG tablet TAKE 1 TABLET BY MOUTH EVERY 6 HOURS AS NEEDED FOR MUSCLE SPASMS. 60 tablet 5   metroNIDAZOLE  (FLAGYL ) 500 MG tablet Take 1 tablet (500 mg total) by mouth 3 (three) times daily. 30 tablet 0   montelukast  (SINGULAIR ) 10 MG tablet Take 1 tablet (10 mg total) by mouth daily. 90 tablet 3   nystatin  (MYCOSTATIN ) 100000 UNIT/ML suspension Take 5 mLs (500,000 Units total) by mouth 4 (four) times daily. 473 mL 0   ondansetron  (ZOFRAN ) 4 MG tablet Take 1 tablet (4 mg total) by mouth every 8 (eight) hours as needed. 30 tablet 0   temazepam  (RESTORIL ) 30 MG capsule Take 1 capsule (30 mg total) by mouth at bedtime as needed for sleep. 30 capsule 0   tirzepatide  (MOUNJARO ) 5 MG/0.5ML Pen Into the skin weekly 2 mL 0   triamcinolone  cream (KENALOG ) 0.1 % Apply 1 Application topically 2 (two) times daily as needed (rash). 45 g 2   Vitamin D , Ergocalciferol , (DRISDOL ) 1.25 MG (50000 UNIT) CAPS capsule TAKE 1 CAPSULE (50,000 UNITS TOTAL) BY MOUTH EVERY 14 DAYS 6 capsule 0   albuterol  (PROVENTIL ) (2.5 MG/3ML) 0.083% nebulizer solution Take 3 mLs (2.5 mg total) by nebulization every 4 (four) hours as needed for wheezing or shortness of breath. 75 mL 12   albuterol  (VENTOLIN  HFA) 108 (90 Base) MCG/ACT inhaler INHALE 2 PUFFS EVERY 6 HOURS AS NEEDED FOR WHEEZING OR SHORTNESS OF BREATH. 54 each 0   No facility-administered medications prior to visit.    Review of Systems  Constitutional:  Negative for chills, diaphoresis, fever, malaise/fatigue and weight loss.  HENT:  Negative for congestion.   Respiratory:  Positive for cough and wheezing. Negative for hemoptysis, sputum production and shortness of breath.   Cardiovascular:  Negative for chest pain, palpitations and leg swelling.     Objective:   Vitals:   09/12/23 1307  BP: (!) 145/80  Pulse: 98  SpO2: 96%  Weight: 243 lb 3.2 oz (110.3 kg)  Height: 5' 10 (1.778  m)   SpO2: 96 %  Physical Exam: General: Well-appearing, no acute distress HENT: [place], AT Eyes: EOMI, no scleral icterus Respiratory: Clear to auscultation bilaterally.  No crackles, wheezing or rales Cardiovascular: RRR, -M/R/G, no JVD Extremities:-Edema,-tenderness Neuro: AAO x4, CNII-XII grossly intact Psych: Normal mood, normal affect  Data Reviewed:  Imaging: CT A/P 07/04/11 - Visualized lung bases without scarring or atelectasis CT A/P 10/03/20 - Minimal left basilar scarring/atelectasis CTA 12/24/21 - RUL 3 mm subpleural nodule. Bilateral patchy GGO CXR 12/28/22 Worsening RML/RLL opacities CT Chest 07/11/22 - Near complete interval resolution of consolidations. Stable 3 mm solid lung nodules  PFT: 01/04/18 FVC 5.11 (122%) FEV1 4.38 (133%) Ratio 86  TLC 66% DLCO 114%. No significant BD response Interpretation: Mild restrictive defect. Normal DLC  Labs: CBC    Component Value Date/Time   WBC 7.2 03/07/2023 1523   RBC 4.64 03/07/2023 1523   HGB 14.6 03/07/2023 1523   HGB 16.0 10/23/2017 0953   HCT 43.3 03/07/2023 1523   HCT 42.6 10/23/2017 0953   PLT 349.0 03/07/2023 1523   MCV 93.5 03/07/2023 1523   MCV 88 10/23/2017 0953   MCH 31.9 12/26/2021 0350   MCHC 33.7 03/07/2023 1523   RDW 13.4 03/07/2023 1523   RDW 13.4 10/23/2017 0953   LYMPHSABS 2.0 03/07/2023 1523   LYMPHSABS 2.2 10/23/2017 0953   MONOABS 0.5 03/07/2023 1523   EOSABS 0.2 03/07/2023 1523   EOSABS 0.5 (H) 10/23/2017 0953   BASOSABS 0.1 03/07/2023 1523   BASOSABS 0.0 10/23/2017 0953   Absolute eos 10/23/17 - 500 12/24/21 - 100 - improved     Assessment & Plan:   Discussion: 63 year old male with asthma, nasal polyposis, chronic sinusitis, DM, GERD who presents for follow-up for asthma and hx recurrent pneumonias. Last significant episode of PNA in 10/2021. CT scan in 07/2022 with resolved infiltrates. Previously well controlled asthma on Symbicort  for >5 years. Also has good results with generic  Symbicort . Counseled on additional of nebulizer for breakthrough symptoms.  Moderate persistent asthma - overall well controlled Mild restrictive defect with normal DLCO - no evidence of fibrosis on imaging --CONTINUE Symbicort  160-4.5 mcg TWO puffs in the morning and evening. Rinse mouth out after use --CONTINUE Albuterol  inhaler every 4-6 hours AS NEEDED  --CONTINUE nebulizer as needed. REFILLED Duonebs  Nasal congestion Previously on Astelin 0.15%.  CONTINUE flonase  two puffs per nare daily  RML/RLL consolidation - resolved on June 2024 CT scan S/p bronchoscopy with BAL 12/28/22. Predominantly monocytic and neutrophilic, 56% and 41% respectively --Cultures and cytology negative --No further follow-up  Health Maintenance Immunization History  Administered Date(s) Administered   Fluad Quad(high Dose 65+) 01/03/2019, 12/04/2019, 12/29/2020   Influenza, High Dose Seasonal PF 03/14/2018   PFIZER Comirnaty(Gray Top)Covid-19 Tri-Sucrose Vaccine 05/25/2020   PFIZER(Purple Top)SARS-COV-2 Vaccination 03/12/2019, 04/06/2019, 01/14/2020   Tdap 08/21/2014   CT Lung Screen - not qualified. Insufficient tobacco history  No orders of the defined types were placed in this encounter.  Meds ordered this encounter  Medications   albuterol  (PROVENTIL ) (2.5 MG/3ML) 0.083% nebulizer solution    Sig: Take 3 mLs (2.5 mg total) by nebulization every 4 (four) hours as needed for wheezing or shortness of breath.    Dispense:  225 mL    Refill:  3   albuterol  (VENTOLIN  HFA) 108 (90 Base) MCG/ACT inhaler    Sig: INHALE 2 PUFFS EVERY 6 HOURS AS NEEDED FOR WHEEZING OR SHORTNESS OF BREATH.    Dispense:  54 each    Refill:  1    Return in about 6 months (around 03/14/2024).  I have spent a total time of 32-minutes on the day of the appointment including chart review, data review, collecting history, coordinating care and discussing medical diagnosis and plan with the patient/family. Past medical history,  allergies, medications were reviewed. Pertinent imaging, labs and tests included in this note have been reviewed and interpreted independently by me.  Marsela Kuan Slater Staff, MD [facility] 09/12/2023 1:28 PM

## 2023-09-12 NOTE — Patient Instructions (Signed)
 Moderate persistent asthma  Mild restrictive defect with normal DLCO - no evidence of fibrosis on imaging --CONTINUE Symbicort  160-4.5 mcg TWO puffs in the morning and evening. Rinse mouth out after use --CONTINUE Albuterol  inhaler every 4-6 hours AS NEEDED  --CONTINUE nebulizer as needed. REFILLED Duonebs  Nasal congestion Previously on Astelin 0.15%.  CONTINUE flonase  two puffs per nare daily

## 2023-09-14 ENCOUNTER — Encounter (HOSPITAL_BASED_OUTPATIENT_CLINIC_OR_DEPARTMENT_OTHER): Payer: Self-pay | Admitting: Pulmonary Disease

## 2023-09-26 ENCOUNTER — Ambulatory Visit

## 2023-09-26 ENCOUNTER — Ambulatory Visit (INDEPENDENT_AMBULATORY_CARE_PROVIDER_SITE_OTHER): Admitting: Podiatry

## 2023-09-26 VITALS — Ht 70.0 in | Wt 243.2 lb

## 2023-09-26 DIAGNOSIS — S92534D Nondisplaced fracture of distal phalanx of right lesser toe(s), subsequent encounter for fracture with routine healing: Secondary | ICD-10-CM

## 2023-09-26 DIAGNOSIS — S92534A Nondisplaced fracture of distal phalanx of right lesser toe(s), initial encounter for closed fracture: Secondary | ICD-10-CM

## 2023-09-26 NOTE — Progress Notes (Signed)
  Subjective:  Patient ID: Thomas [name], male    DOB: February 28, 1960,  MRN: [id number]  Chief Complaint  Patient presents with   Foot Pain    Rm 3 Pain is here for right foot pain.   Toe Pain    Rm 3 Patient is here for right hallux injury. Patient states some bruising and swelling in right hallux after prolonged standing and walking.    63 y.o. male presents with the above complaint. History confirmed with patient.  Still painful and swelling he has been using the surgical shoe.  Objective:  Physical Exam: warm, good capillary refill, no trophic changes or ulcerative lesions, normal DP and PT pulses, normal sensory exam, and tenderness and swelling around third toe.  Hallux valgus deformity  Radiographs: Multiple views x-ray of the right foot: New films taken today show persistent fracture lucency at the base of the third toe Assessment:   1. Closed nondisplaced fracture of distal phalanx of lesser toe of right foot with routine healing, subsequent encounter      Plan:  Patient was evaluated and treated and all questions answered.  Showing some progress but needs to continue in the surgical shoe for little longer.  He takes a 50,000 unit every other week vitamin D  supplement he will continue to take this.  Follow-up with me in 1 month for new x-rays, remain weightbearing as tolerated in surgical shoe.  Okay to cease buddy splinting  Return in about 1 month (around 10/27/2023) for  (new xrays) follow up on right 3rd toe fracture.

## 2023-09-28 ENCOUNTER — Ambulatory Visit: Admitting: Bariatrics

## 2023-09-28 ENCOUNTER — Encounter: Payer: Self-pay | Admitting: Bariatrics

## 2023-09-28 VITALS — BP 150/89 | HR 90 | Temp 98.6°F | Ht 70.0 in | Wt 236.0 lb

## 2023-09-28 DIAGNOSIS — E1169 Type 2 diabetes mellitus with other specified complication: Secondary | ICD-10-CM

## 2023-09-28 DIAGNOSIS — E559 Vitamin D deficiency, unspecified: Secondary | ICD-10-CM | POA: Diagnosis not present

## 2023-09-28 DIAGNOSIS — Z6833 Body mass index (BMI) 33.0-33.9, adult: Secondary | ICD-10-CM

## 2023-09-28 DIAGNOSIS — E669 Obesity, unspecified: Secondary | ICD-10-CM | POA: Diagnosis not present

## 2023-09-28 DIAGNOSIS — Z7985 Long-term (current) use of injectable non-insulin antidiabetic drugs: Secondary | ICD-10-CM

## 2023-09-28 MED ORDER — VITAMIN D (ERGOCALCIFEROL) 1.25 MG (50000 UNIT) PO CAPS: ORAL_CAPSULE | ORAL | 0 refills | Status: DC

## 2023-09-28 MED ORDER — TIRZEPATIDE 7.5 MG/0.5ML ~~LOC~~ SOAJ: 7.5000 mg | SUBCUTANEOUS | 0 refills | Status: DC

## 2023-09-28 NOTE — Progress Notes (Signed)
 WEIGHT SUMMARY AND BIOMETRICS  Weight Lost Since Last Visit: 0  Weight Gained Since Last Visit: 2lb   Vitals Temp: 98.6 F (37 C) BP: (!) 150/89 Pulse Rate: 90 SpO2: 98 %   Anthropometric Measurements Height: 5' 10 (1.778 m) Weight: 236 lb (107 kg) BMI (Calculated): 33.86 Weight at Last Visit: 234lb Weight Lost Since Last Visit: 0 Weight Gained Since Last Visit: 2lb Starting Weight: 249lb Total Weight Loss (lbs): 12 lb (5.443 kg)   Body Composition  Body Fat %: 31.5 % Fat Mass (lbs): 74.4 lbs Muscle Mass (lbs): 153.6 lbs Total Body Water (lbs): 111.6 lbs Visceral Fat Rating : 19   Other Clinical Data Fasting: no Labs: no Today's Visit #: 24 Starting Date: 04/14/21    OBESITY Thomas [name] is here to discuss his progress with his obesity treatment plan along with follow-up of his obesity related diagnoses.    Nutrition Plan: low carbohydrate plan - 90% adherence.  Current exercise: Resistance bands.  Interim History:  He is up 2 lbs since his last visit.  He is wearing a boot on his right foot secondary to a toe fracture and he will be wearing the boot for another 4 weeks.  He has not been able to do any significant exercise other than some upper body work.  He is retaining his muscle per the bioimpedance scale. Eating all of the food on the plan., Protein intake is less than prescribed., Is not skipping meals, and Water intake is adequate.   Pharmacotherapy: Rice is on Mounjaro  5.0 mg SQ weekly Adverse side effects: None Hunger is moderately controlled.  Cravings are moderately controlled.  Assessment/Plan:  Vitamin D  Deficiency Vitamin D  is at goal of 50.  Most recent vitamin D  level was 55.5. He is on  prescription ergocalciferol  50,000 IU weekly. Lab Results  Component Value Date   VD25OH 55.5 09/21/2021   VD25OH 43.6 04/14/2021    VD25OH 56.1 02/12/2018    Plan: Refill prescription vitamin D  50,000 IU weekly.   Type II Diabetes HgbA1c is at goal. Last A1c was 6.4 CBGs: Not checking      Episodes of hypoglycemia: no Medication(s): Mounjaro  5.0 mg SQ weekly  Lab Results  Component Value Date   HGBA1C 6.4 03/07/2023   HGBA1C 6.3 (H) 12/24/2021   HGBA1C 5.5 09/21/2021   Lab Results  Component Value Date   LDLCALC 129 (H) 03/07/2023   CREATININE 0.96 03/07/2023   Lab Results  Component Value Date   GFR 83.58 03/07/2023   GFR 70.46 02/24/2021   GFR 76.32 10/19/2020    Plan: Continue and increase dose Mounjaro  7.5 mg SQ weekly Continue all other medications.  Will keep all carbohydrates low both sweets and starches.  Aim for 7 to 9 hours of sleep nightly.  Eat more low glycemic index foods.  He will continue to exercise as tolerated and  will continue to wear his boot and do some upper arm exercises over time. He will increase his protein up to 80 to 100 g daily.   He will continue to track his calories.    Generalized Obesity: Current BMI BMI (Calculated): 33.86   Pharmacotherapy Plan Continue and increase dose  Mounjaro  7.5 mg SQ weekly  Isai is currently in the action stage of change. As such, his goal is to continue with weight loss efforts.  He has agreed to low carbohydrate plan.  Exercise goals: Older adults should determine their level of effort for physical activity relative to their level of fitness.   Behavioral modification strategies: increasing lean protein intake, meal planning , increase water intake, better snacking choices, planning for success, increasing vegetables, keep healthy foods in the home, weigh protein portions, measure portion sizes, and mindful eating.  Fallon has agreed to follow-up with our clinic in 4 weeks.     Objective:   VITALS: Per patient if applicable, see vitals. GENERAL: Alert and in no acute distress. CARDIOPULMONARY: No increased WOB.  Speaking in clear sentences.  PSYCH: Pleasant and cooperative. Speech normal rate and rhythm. Affect is appropriate. Insight and judgement are appropriate. Attention is focused, linear, and appropriate.  NEURO: Oriented as arrived to appointment on time with no prompting.   Attestation Statements:   This was prepared with the assistance of Engineer, civil (consulting).  Occasional wrong-word or sound-a-like substitutions may have occurred due to the inherent limitations of voice recognition   Clayborne Daring, DO

## 2023-10-12 ENCOUNTER — Ambulatory Visit: Admitting: Podiatry

## 2023-10-16 ENCOUNTER — Telehealth (INDEPENDENT_AMBULATORY_CARE_PROVIDER_SITE_OTHER): Payer: Self-pay

## 2023-10-16 NOTE — Telephone Encounter (Signed)
 Started PA for [facility] via covermymeds.

## 2023-10-16 NOTE — Telephone Encounter (Signed)
 Message from Plan  Approved. . Authorization Expiration Date: October 15, 2024.

## 2023-10-25 ENCOUNTER — Ambulatory Visit (AMBULATORY_SURGERY_CENTER)

## 2023-10-25 VITALS — Ht 71.0 in | Wt 232.0 lb

## 2023-10-25 DIAGNOSIS — K22719 Barrett's esophagus with dysplasia, unspecified: Secondary | ICD-10-CM

## 2023-10-25 DIAGNOSIS — Z860100 Personal history of colon polyps, unspecified: Secondary | ICD-10-CM

## 2023-10-25 DIAGNOSIS — K219 Gastro-esophageal reflux disease without esophagitis: Secondary | ICD-10-CM

## 2023-10-25 NOTE — Progress Notes (Signed)

## 2023-10-27 ENCOUNTER — Encounter

## 2023-10-31 ENCOUNTER — Ambulatory Visit (INDEPENDENT_AMBULATORY_CARE_PROVIDER_SITE_OTHER)

## 2023-10-31 ENCOUNTER — Ambulatory Visit: Admitting: Podiatry

## 2023-10-31 VITALS — Ht 71.0 in | Wt 232.0 lb

## 2023-10-31 DIAGNOSIS — S92534D Nondisplaced fracture of distal phalanx of right lesser toe(s), subsequent encounter for fracture with routine healing: Secondary | ICD-10-CM

## 2023-10-31 NOTE — Progress Notes (Signed)
  Subjective:  Patient ID: Thomas [name], male    DOB: May 26, 1960,  MRN: [id number]  Chief Complaint  Patient presents with   Fracture    Return in about 1 month (around 10/27/2023) for (new xrays) follow up on right 3rd toe fracture.    63 y.o. male presents with the above complaint. History confirmed with patient.  He returns for follow-up notes improvement now  Objective:  Physical Exam: warm, good capillary refill, no trophic changes or ulcerative lesions, normal DP and PT pulses, normal sensory exam, and today no pain around the third toe  Radiographs: Multiple views x-ray of the right foot: New films taken today show interval Bridging across fracture lucency Assessment:   1. Closed nondisplaced fracture of distal phalanx of lesser toe of right foot with routine healing, subsequent encounter      Plan:  Patient was evaluated and treated and all questions answered.  Show some interval healing may transition to regular shoe gear and activity as tolerated discussed starting with low impact activity and gradually returning to full exercise as tolerated.  Return as needed if there are further issues.  No follow-ups on file.

## 2023-11-01 ENCOUNTER — Encounter: Payer: Self-pay | Admitting: Bariatrics

## 2023-11-01 ENCOUNTER — Ambulatory Visit (INDEPENDENT_AMBULATORY_CARE_PROVIDER_SITE_OTHER): Admitting: Bariatrics

## 2023-11-01 VITALS — BP 130/76 | HR 85 | Temp 98.0°F | Ht 70.0 in | Wt 233.0 lb

## 2023-11-01 DIAGNOSIS — E669 Obesity, unspecified: Secondary | ICD-10-CM

## 2023-11-01 DIAGNOSIS — Z6833 Body mass index (BMI) 33.0-33.9, adult: Secondary | ICD-10-CM

## 2023-11-01 DIAGNOSIS — Z7985 Long-term (current) use of injectable non-insulin antidiabetic drugs: Secondary | ICD-10-CM

## 2023-11-01 DIAGNOSIS — E119 Type 2 diabetes mellitus without complications: Secondary | ICD-10-CM

## 2023-11-01 NOTE — Progress Notes (Signed)
 WEIGHT SUMMARY AND BIOMETRICS  Weight Lost Since Last Visit: 3lb  Weight Gained Since Last Visit: 0   Vitals Temp: 98 F (36.7 C) BP: 130/76 Pulse Rate: 85 SpO2: 97 %   Anthropometric Measurements Height: 5' 10 (1.778 m) Weight: 233 lb (105.7 kg) BMI (Calculated): 33.43 Weight at Last Visit: 236lb Weight Lost Since Last Visit: 3lb Weight Gained Since Last Visit: 0 Starting Weight: 249lb Total Weight Loss (lbs): 15 lb (6.804 kg)   Body Composition  Body Fat %: 30.2 % Fat Mass (lbs): 70.4 lbs Muscle Mass (lbs): 154.6 lbs Total Body Water (lbs): 112.2 lbs Visceral Fat Rating : 18   Other Clinical Data Fasting: no Labs: no Today's Visit #: 25 Starting Date: 04/14/21    OBESITY Remigio is here to discuss his progress with his obesity treatment plan along with follow-up of his obesity related diagnoses.    Nutrition Plan: low carbohydrate plan - 90% adherence.  Current exercise: yard work  Interim History:  He is down another 3 lbs since his last visit. Per the bio-impedence, his fat % is down and his muscle mass is up.  Eating all of the food on the plan., Protein intake is as prescribed, Is not skipping meals, Not journaling consistently., and Water intake is adequate.   Pharmacotherapy: Avraj is on Mounjaro  7.5 mg SQ weekly Adverse side effects: None Hunger is are well controlled and food noise is gone.  Cravings are well controlled.  Assessment/Plan:   Type II Diabetes HgbA1c is at goal. Last A1c was 6.4 CBGs: Not checking      Episodes of hypoglycemia: no Medication(s): Mounjaro  7.5 mg SQ weekly  Lab Results  Component Value Date   HGBA1C 6.4 03/07/2023   HGBA1C 6.3 (H) 12/24/2021   HGBA1C 5.5 09/21/2021   Lab Results  Component Value Date   LDLCALC 129 (H) 03/07/2023   CREATININE 0.96 03/07/2023   Lab Results  Component  Value Date   GFR 83.58 03/07/2023   GFR 70.46 02/24/2021   GFR 76.32 10/19/2020    Plan: Continue and refill Mounjaro  7.5 mg SQ weekly Continue all other medications.  Will keep all carbohydrates low both sweets and starches.  Will continue exercise regimen to 30 to 60 minutes on most days of the week.  Will continue low-impact walking.  Will have a protein shake in the am. Information sheet for [employer] and [employer].    Generalized Obesity: Current BMI BMI (Calculated): 33.43   Pharmacotherapy Plan Continue  Mounjaro  7.5 mg SQ weekly  Django is currently in the action stage of change. As such, his goal is to continue with weight loss efforts.  He has agreed to low carbohydrate plan.  Exercise goals: Older adults should do exercises that maintain or improve balance if they are at risk of falling.   Behavioral modification strategies: increasing lean protein intake,  no meal skipping, meal planning , increase water intake, better snacking choices, planning for success, increasing vegetables, keep healthy foods in the home, work on smaller portions, pack lunch for work, and mindful eating.  Treson has agreed to follow-up with our clinic in 4 weeks.    Objective:   VITALS: Per patient if applicable, see vitals. GENERAL: Alert and in no acute distress. CARDIOPULMONARY: No increased WOB. Speaking in clear sentences.  PSYCH: Pleasant and cooperative. Speech normal rate and rhythm. Affect is appropriate. Insight and judgement are appropriate. Attention is focused, linear, and appropriate.  NEURO: Oriented as arrived to appointment on time with no prompting.   Attestation Statements:   This was prepared with the assistance of Engineer, civil (consulting).  Occasional wrong-word or sound-a-like substitutions may have occurred due to the inherent limitations of voice recognition   Clayborne Daring, DO

## 2023-11-06 ENCOUNTER — Ambulatory Visit: Admitting: Family Medicine

## 2023-11-06 ENCOUNTER — Ambulatory Visit (INDEPENDENT_AMBULATORY_CARE_PROVIDER_SITE_OTHER): Admitting: Family Medicine

## 2023-11-06 ENCOUNTER — Encounter: Payer: Self-pay | Admitting: Family Medicine

## 2023-11-06 VITALS — BP 110/60 | HR 60 | Temp 98.6°F | Wt 244.0 lb

## 2023-11-06 DIAGNOSIS — K5900 Constipation, unspecified: Secondary | ICD-10-CM

## 2023-11-06 DIAGNOSIS — R143 Flatulence: Secondary | ICD-10-CM

## 2023-11-06 NOTE — Progress Notes (Signed)
   Subjective:    Patient ID: Thomas [name], male    DOB: 08/26/1960, 63 y.o.   MRN: [id number]  HPI Here to discuss burping up gas and passing flatus that both smell horribly. This started rather suddenly 2 weeks ago. There have been no recent medication or diet changes. He has used a probiotic that contains strains of lactobacillus and bifidobacterium every day for the past year. He did start using Mounjaro  injections several months ago. These have caused him to be slightly constipated. He drinks plenty of water.    Review of Systems  Constitutional: Negative.   Respiratory: Negative.    Cardiovascular: Negative.   Gastrointestinal:  Positive for constipation. Negative for abdominal distention, abdominal pain, blood in stool, diarrhea, nausea and vomiting.       Objective:   Physical Exam Constitutional:      Appearance: Normal appearance.  Cardiovascular:     Rate and Rhythm: Normal rate and regular rhythm.     Pulses: Normal pulses.     Heart sounds: Normal heart sounds.  Pulmonary:     Effort: Pulmonary effort is normal.     Breath sounds: Normal breath sounds.  Abdominal:     General: Abdomen is flat. Bowel sounds are normal. There is no distension.     Palpations: Abdomen is soft. There is no mass.     Tenderness: There is no abdominal tenderness. There is no right CVA tenderness, left CVA tenderness, guarding or rebound.     Hernia: No hernia is present.  Neurological:     Mental Status: He is alert.           Assessment & Plan:  His recent foul smelling burps and flatus are likely related to the bacterial flora in his gut. I suggested he try a different type of probiotic that contains either different bacterial strains or a yeast based product. For the constipation, I advised him to use Miralax  every day.  Garnette Olmsted, MD

## 2023-11-15 ENCOUNTER — Encounter: Payer: Self-pay | Admitting: Gastroenterology

## 2023-11-15 ENCOUNTER — Ambulatory Visit (AMBULATORY_SURGERY_CENTER): Admitting: Gastroenterology

## 2023-11-15 VITALS — BP 130/69 | HR 86 | Temp 98.2°F | Resp 15 | Ht 71.0 in | Wt 232.0 lb

## 2023-11-15 DIAGNOSIS — Z980 Intestinal bypass and anastomosis status: Secondary | ICD-10-CM

## 2023-11-15 DIAGNOSIS — K222 Esophageal obstruction: Secondary | ICD-10-CM | POA: Diagnosis not present

## 2023-11-15 DIAGNOSIS — K640 First degree hemorrhoids: Secondary | ICD-10-CM | POA: Diagnosis not present

## 2023-11-15 DIAGNOSIS — K2950 Unspecified chronic gastritis without bleeding: Secondary | ICD-10-CM

## 2023-11-15 DIAGNOSIS — D123 Benign neoplasm of transverse colon: Secondary | ICD-10-CM | POA: Diagnosis not present

## 2023-11-15 DIAGNOSIS — K2100 Gastro-esophageal reflux disease with esophagitis, without bleeding: Secondary | ICD-10-CM

## 2023-11-15 DIAGNOSIS — Z860100 Personal history of colon polyps, unspecified: Secondary | ICD-10-CM

## 2023-11-15 DIAGNOSIS — K449 Diaphragmatic hernia without obstruction or gangrene: Secondary | ICD-10-CM

## 2023-11-15 DIAGNOSIS — K5730 Diverticulosis of large intestine without perforation or abscess without bleeding: Secondary | ICD-10-CM | POA: Diagnosis not present

## 2023-11-15 DIAGNOSIS — K317 Polyp of stomach and duodenum: Secondary | ICD-10-CM

## 2023-11-15 DIAGNOSIS — Z1211 Encounter for screening for malignant neoplasm of colon: Secondary | ICD-10-CM | POA: Diagnosis not present

## 2023-11-15 DIAGNOSIS — K209 Esophagitis, unspecified without bleeding: Secondary | ICD-10-CM

## 2023-11-15 DIAGNOSIS — K219 Gastro-esophageal reflux disease without esophagitis: Secondary | ICD-10-CM

## 2023-11-15 DIAGNOSIS — Z9889 Other specified postprocedural states: Secondary | ICD-10-CM

## 2023-11-15 MED ORDER — LANSOPRAZOLE 30 MG PO CPDR: 30.0000 mg | DELAYED_RELEASE_CAPSULE | Freq: Two times a day (BID) | ORAL | 11 refills | Status: AC

## 2023-11-15 MED ORDER — SODIUM CHLORIDE 0.9 % IV SOLN: 500.0000 mL | Freq: Once | INTRAVENOUS | Status: DC

## 2023-11-15 NOTE — Patient Instructions (Addendum)
 Please see handout provided on postdilation diet.   Await pathology results.  Continue present medications.  Increase Prevacid  to 30mg  twice a day for 8 weeks, then decrease to once a day.  YOU HAD AN ENDOSCOPIC PROCEDURE TODAY AT THE  ENDOSCOPY CENTER:   Refer to the procedure report that was given to you for any specific questions about what was found during the examination.  If the procedure report does not answer your questions, please call your gastroenterologist to clarify.  If you requested that your care partner not be given the details of your procedure findings, then the procedure report has been included in a sealed envelope for you to review at your convenience later.  YOU SHOULD EXPECT: Some feelings of bloating in the abdomen. Passage of more gas than usual.  Walking can help get rid of the air that was put into your GI tract during the procedure and reduce the bloating. If you had a lower endoscopy (such as a colonoscopy or flexible sigmoidoscopy) you may notice spotting of blood in your stool or on the toilet paper. If you underwent a bowel prep for your procedure, you may not have a normal bowel movement for a few days.  Please Note:  You might notice some irritation and congestion in your nose or some drainage.  This is from the oxygen used during your procedure.  There is no need for concern and it should clear up in a day or so.  SYMPTOMS TO REPORT IMMEDIATELY:  Following lower endoscopy (colonoscopy or flexible sigmoidoscopy):  Excessive amounts of blood in the stool  Significant tenderness or worsening of abdominal pains  Swelling of the abdomen that is new, acute  Fever of 100F or higher  Following upper endoscopy (EGD)  Vomiting of blood or coffee ground material  New chest pain or pain under the shoulder blades  Painful or persistently difficult swallowing  New shortness of breath  Fever of 100F or higher  Black, tarry-looking stools  For urgent or  emergent issues, a gastroenterologist can be reached at any hour by calling [phone]. Do not use MyChart messaging for urgent concerns.    DIET:  We do recommend following a post dilation diet. See handout provided. Drink plenty of fluids but you should avoid alcoholic beverages for 24 hours.  ACTIVITY:  You should plan to take it easy for the rest of today and you should NOT DRIVE or use heavy machinery until tomorrow (because of the sedation medicines used during the test).    FOLLOW UP: Our staff will call the number listed on your records the next business day following your procedure.  We will call around 7:15- 8:00 am to check on you and address any questions or concerns that you may have regarding the information given to you following your procedure. If we do not reach you, we will leave a message.     If any biopsies were taken you will be contacted by phone or by letter within the next 1-3 weeks.  Please call us  at [phone] if you have not heard about the biopsies in 3 weeks.    SIGNATURES/CONFIDENTIALITY: You and/or your care partner have signed paperwork which will be entered into your electronic medical record.  These signatures attest to the fact that that the information above on your After Visit Summary has been reviewed and is understood.  Full responsibility of the confidentiality of this discharge information lies with you and/or your care-partner.

## 2023-11-15 NOTE — Progress Notes (Signed)
 0830 Ephedrine  10 mg given IV due to low BP, MD updated.

## 2023-11-15 NOTE — Progress Notes (Signed)
 Pt's states no medical or surgical changes since previsit or office visit.

## 2023-11-15 NOTE — Op Note (Signed)
 [facility] Patient Name: Thomas [name] Procedure Date: 11/15/2023 7:16 AM MRN: [id number] Endoscopist: Lynnie Bring , MD, [id number] Age: 63 Referring MD:  Date of Birth: 1960-12-31 Gender: Male Account #: [id number] Procedure:                Colonoscopy Indications:              High risk colon cancer surveillance: Personal                            history of colonic polyps. No further diarrhea. Medicines:                Monitored Anesthesia Care Procedure:                Pre-Anesthesia Assessment:                           - Prior to the procedure, a History and Physical                            was performed, and patient medications and                            allergies were reviewed. The patient's tolerance of                            previous anesthesia was also reviewed. The risks                            and benefits of the procedure and the sedation                            options and risks were discussed with the patient.                            All questions were answered, and informed consent                            was obtained. Prior Anticoagulants: The patient has                            taken no anticoagulant or antiplatelet agents. ASA                            Grade Assessment: II - A patient with mild systemic                            disease. After reviewing the risks and benefits,                            the patient was deemed in satisfactory condition to                            undergo the procedure.  After obtaining informed consent, the colonoscope                            was passed under direct vision. Throughout the                            procedure, the patient's blood pressure, pulse, and                            oxygen saturations were monitored continuously. The                            Olympus Scope CF SN: [id number] was introduced through                            the anus and  advanced to the the cecum, identified                            by appendiceal orifice and ileocecal valve. The                            colonoscopy was performed without difficulty. The                            patient tolerated the procedure well. The quality                            of the bowel preparation was adequate to identify                            polyps greater than 5 mm in size. The ileocecal                            valve, appendiceal orifice, and rectum were                            photographed.                           Limited exam of the right colon due to retained                            stool despite aggressive suctioning and aspiration. Scope In: 8:27:39 AM Scope Out: 8:41:44 AM Scope Withdrawal Time: 0 hours 10 minutes 21 seconds  Total Procedure Duration: 0 hours 14 minutes 5 seconds  Findings:                 A 6 mm polyp was found in the mid transverse colon.                            The polyp was sessile. The polyp was removed with a  cold snare. Resection and retrieval were complete.                           There was evidence of a prior end-to-end                            colo-colonic anastomosis in the sigmoid colon at 20                            cm. This was patent and was characterized by                            healthy appearing mucosa.                           Multiple medium-mouthed diverticula were found in                            the neo-sigmoid colon, descending colon, few in                            transverse colon and ascending colon.                           Non-bleeding internal hemorrhoids were found during                            retroflexion. The hemorrhoids were small and Grade                            I (internal hemorrhoids that do not prolapse).                            Scarring was noted in the rectum likely consistent                            with previous hemorrhoid  banding.                           Retroflexion in the right colon was performed.                           The exam was otherwise without abnormality on                            direct and retroflexion views. Complications:            No immediate complications. Estimated Blood Loss:     Estimated blood loss: none. Impression:               - One 6 mm polyp in the mid transverse colon,                            removed with a cold snare. Resected and retrieved.                           -  Patent end-to-end colo-colonic anastomosis,                            characterized by healthy appearing mucosa.                           - Pancolonic diverticulosis predominantly in the                            neosigmoid colon.                           - Non-bleeding internal hemorrhoids.                           - The examination was otherwise normal on direct                            and retroflexion views. Recommendation:           - Patient has a contact number available for                            emergencies. The signs and symptoms of potential                            delayed complications were discussed with the                            patient. Return to normal activities tomorrow.                            Written discharge instructions were provided to the                            patient.                           - Resume previous diet.                           - Continue present medications.                           - Await pathology results.                           - Repeat colonoscopy for surveillance based on                            pathology results. Likely in 3 years with 2-day                            prep.                           - The findings and recommendations were discussed  with the patient's family. Lynnie Bring, MD 11/15/2023 8:54:56 AM This report has been signed electronically.

## 2023-11-15 NOTE — Progress Notes (Signed)
0804 Robinul 0.1 mg IV given due large amount of secretions upon assessment.  MD made aware, vss 

## 2023-11-15 NOTE — Progress Notes (Signed)
 Report given to PACU, vss

## 2023-11-15 NOTE — Progress Notes (Signed)
 Brief Narrative 63 y.o. yo male with a past medical history not limited to sigmoid diverticulosis / diverticulitis, GERD, hiatal hernia, hypertension, vitamin D  deficiency, colon polyps.  Patient is new to the practice, referred by PCP for history of GERD.  Patient's previous gastroenterologist Dr. Luis has retired and patient transferring care to our practice     ASSESSMENT     Chronic GERD without Barrett's esophagus / 5 cm hiatal hernia on last EGD in 2020.  -- Asymptomatic on daily PPI   Remote history of colon polyps ( ? Year).   No polyps on surveillance colonoscopies.  No family history of colon cancer.    See PMH for any additional medical history     PLAN    --Remote history of adenomatous colon polyps but none on last 3 colonoscopies in 2011, 2016 and 2020. Patient believes he is due soon for follow up colonoscopy ( and EGD).  Will review with Dr. Charlanne but I suspect he will not need another colonoscopy until 2030 (10 years from his last one). Additionally, GERD symptoms controlled on PPI. There isn't an indication for a repeat EGD  -- Continue low-dose daily Prevacid  15 mg before breakfast   HPI    Chief complaint : Longstanding GERD, doing well on Prevacid .  History of colon polyps.    Chronic GERD Thomas [name] has a history of chronic GERD but reflux symptoms have been controlled for years on daily PPI.  He takes Prevacid  15 mg daily.  No dysphagia.  Weight is stable.    History of colon polyps.  Patient also has a remote history of colon polyps.  Unclear when he had the polyps removed but it was prior to 2011. No polyps on subsequent colonoscopies x 3 .  He isn't having any bowel changes. No blood in stool. No FMH of colon cancer.    He has a history diverticulitis complicated by perforation in 2009 for which he is status post  left colon resection.  In 2022 he had an episode of recurrent left-sided diverticulitis.    Muscab is inquiring about whether it is time  for follow up colonoscopy. He requests EGD be done at same time because they have always have been done together.      GI History / Studies    **May not be a complete list of studies   Polyp surveillance colonoscopy  Nov 2011 - Dr. Luis -- No polyps.  Mild left-sided diverticulosis status post sigmoid colectomy.  Small internal hemorrhoids   EGD for left upper quadrant pain November 2016-Dr. Medoff -- Normal   Polyp surveillance colonoscopy November 2016-Dr. Medoff --No polyps.  Mild left-sided diverticulosis   EGD June 2020 for GERD-Dr. Luis -5 cm hiatal hernia. Otherwise normal   Polyp surveillance colonoscopy June 2020-Dr. Medoff -No polyps.  Moderate left-sided diverticulosis     CT Chest Wo Contrast CLINICAL DATA:  Nodule   EXAM: CT CHEST WITHOUT CONTRAST   TECHNIQUE: Multidetector CT imaging of the chest was performed following the standard protocol without IV contrast.   RADIATION DOSE REDUCTION: This exam was performed according to the departmental dose-optimization program which includes automated exposure control, adjustment of the mA and/or kV according to patient size and/or use of iterative reconstruction technique.   COMPARISON:  Chest CT dated December 25, 2021   FINDINGS: Cardiovascular: Normal heart size. No pericardial effusion. Normal caliber thoracic aorta with no atherosclerotic disease.   Mediastinum/Nodes: Small hiatal hernia. Thyroid  is unremarkable.  No enlarged lymph nodes seen in the chest.   Lungs/Pleura: Central airways are patent. No consolidation, pleural effusion or pneumothorax. Near-complete interval resolution of bilateral consolidations with minimal residual ground-glass opacities seen in the bilateral upper lobes. Stable 3 mm solid pulmonary nodule of the right upper lobe located on series 3, image 24.   Upper Abdomen: No acute abnormality.   Musculoskeletal: No chest wall mass or suspicious bone lesions identified.    IMPRESSION: 1. Near-complete interval resolution of bilateral consolidations with minimal residual ground-glass opacities seen in the bilateral upper lobes. 2. Stable 3 mm solid pulmonary nodule of the right upper lobe. No follow-up needed if patient is low-risk.This recommendation follows the consensus statement: Guidelines for Management of Incidental Pulmonary Nodules Detected on CT Images: From the Fleischner Society 2017; Radiology 2017; 284:228-243.   Electronically Signed   By: Rea Marc M.D.   On: 07/15/2022 17:57           Past Medical History:  Diagnosis Date   Allergy     Asthma     Back pain     COVID-18 Nov 2021   Diabetes [facility])     GERD (gastroesophageal reflux disease)     History of hiatal hernia     Nasal polyposis     Pneumonia      5 -7 yrs ago   Swallowing difficulty               Past Surgical History:  Procedure Laterality Date   ANTERIOR CERVICAL DECOMP/DISCECTOMY FUSION N/A 07/14/2016    Procedure: Cervical five-six5- Cervical six-seven Anterior cervical decompression/discectomy/fusion;  Surgeon: Unice Pac, MD;  Location: [facility] OR;  Service: Neurosurgery;  Laterality: N/A;   BRONCHIAL WASHINGS   12/27/2021    Procedure: BRONCHIAL WASHINGS;  Surgeon: Mannam, Praveen, MD;  Location: [facility];  Service: Cardiopulmonary;;   COLON SURGERY       COLONOSCOPY   12/22/2014    per Dr. Luis, clear, repeat in 5 yrs (hx of adenomas)    DG THUMB LEFT HAND       DG THUMB RIGHT HAND ([facility] HX)       FUNCTIONAL ENDOSCOPIC SINUS SURGERY       PARTIAL COLECTOMY        sigmoid (12in), Dr. Luis   VIDEO BRONCHOSCOPY N/A 12/27/2021    Procedure: VIDEO BRONCHOSCOPY WITHOUT FLUORO;  Surgeon: Mannam, Praveen, MD;  Location: [facility];  Service: Cardiopulmonary;  Laterality: N/A;             Family History  Problem Relation Age of Onset   Cancer Mother     Cancer Father     Hyperlipidemia Father     Diabetes Other          maternal  family        Social History  Social History         Tobacco Use   Smoking status: Former      Current packs/day: 0.00      Average packs/day: 1 pack/day for 10.0 years (10.0 ttl pk-yrs)      Types: Cigarettes      Start date: 02/01/1976      Quit date: 01/31/1986      Years since quitting: 36.9   Smokeless tobacco: Never  Vaping Use   Vaping status: Never Used  Substance Use Topics   Alcohol use: Yes      Comment: rarely   Drug use: No  Current Outpatient Medications  Medication Sig Dispense Refill   albuterol  (PROVENTIL ) (2.5 MG/3ML) 0.083% nebulizer solution Take 3 mLs (2.5 mg total) by nebulization every 4 (four) hours as needed for wheezing or shortness of breath. 75 mL 12   albuterol  (VENTOLIN  HFA) 108 (90 Base) MCG/ACT inhaler INHALE 2 PUFFS EVERY 6 HOURS AS NEEDED FOR WHEEZING OR SHORTNESS OF BREATH. 6.7 each 1   azithromycin  (ZITHROMAX  Z-PAK) 250 MG tablet As directed 6 each 0   budesonide -formoterol  (SYMBICORT ) 160-4.5 MCG/ACT inhaler Inhale 2 puffs into the lungs 2 (two) times daily. 30.6 g 3   doxycycline  (VIBRA -TABS) 100 MG tablet Take 1 tablet (100 mg total) by mouth 2 (two) times daily. 20 tablet 0   fluticasone  (FLONASE ) 50 MCG/ACT nasal spray SPRAY 2 SPRAYS INTO EACH NOSTRIL EVERY DAY 48 mL 0   HYDROcodone  bit-homatropine (HYCODAN) 5-1.5 MG/5ML syrup Take 5 mLs by mouth every 4 (four) hours as needed. 240 mL 0   ketoconazole  (NIZORAL ) 2 % cream Apply 1 Application topically 2 (two) times daily. 60 g 3   lansoprazole  (PREVACID ) 15 MG capsule Take 1 capsule (15 mg total) by mouth daily. 90 capsule 3   methocarbamol  (ROBAXIN ) 500 MG tablet Take 1 tablet (500 mg total) by mouth every 6 (six) hours as needed for muscle spasms. 60 tablet 5   montelukast  (SINGULAIR ) 10 MG tablet TAKE 1 TABLET BY MOUTH EVERY DAY 90 tablet 3   triamcinolone  cream (KENALOG ) 0.1 % Apply 1 Application topically 2 (two) times daily as needed (rash). 45 g 2   Vitamin D ,  Ergocalciferol , (DRISDOL ) 1.25 MG (50000 UNIT) CAPS capsule TAKE 1 CAPSULE (50,000 UNITS TOTAL) BY MOUTH EVERY 14 DAYS 6 capsule 0      No current facility-administered medications for this visit.      Allergies      Allergies  Allergen Reactions   No Known Allergies        Review of Systems: All systems reviewed and negative except where noted in HPI.       Wt Readings from Last 3 Encounters:  12/23/22 245 lb (111.1 kg)  12/21/22 246 lb (111.6 kg)  12/13/22 240 lb (108.9 kg)      Physical Exam:  BP (!) 142/68   Pulse 74   Ht 5' 11 (1.803 m)   Wt 245 lb (111.1 kg)   SpO2 96%   BMI 34.17 kg/m  Constitutional:  Pleasant, generally well appearing male in no acute distress. Psychiatric:  Normal mood and affect. Behavior is normal. EENT: Pupils normal.  Conjunctivae are normal. No scleral icterus. Neck supple.  Cardiovascular: Normal rate, regular rhythm.  Pulmonary/chest: Effort normal and breath sounds normal. No wheezing, rales or rhonchi. Abdominal: Soft, nondistended, nontender. Bowel sounds active throughout. There are no masses palpable. No hepatomegaly. Neurological: Alert and oriented to person place and time.   Vina Dasen, NP    Attending physician's note   I have taken history, reviewed the chart and examined the patient. I performed a substantive portion of this encounter, including complete performance of at least one of the key components, in conjunction with the APP. I agree with the Advanced Practitioner's note, impression and recommendations.   For EGD/colon today   Anselm Bring, MD [facility] [phone]

## 2023-11-15 NOTE — Op Note (Signed)
 [facility] Patient Name: Thomas [name] Procedure Date: 11/15/2023 7:46 AM MRN: [id number] Endoscopist: Lynnie Bring , MD, [id number] Age: 63 Referring MD:  Date of Birth: 20-Mar-1960 Gender: Male Account #: [id number] Procedure:                Upper GI endoscopy Indications:              Dysphagia. GERD. Medicines:                Monitored Anesthesia Care Procedure:                Pre-Anesthesia Assessment:                           - Prior to the procedure, a History and Physical                            was performed, and patient medications and                            allergies were reviewed. The patient's tolerance of                            previous anesthesia was also reviewed. The risks                            and benefits of the procedure and the sedation                            options and risks were discussed with the patient.                            All questions were answered, and informed consent                            was obtained. Prior Anticoagulants: The patient has                            taken no anticoagulant or antiplatelet agents. ASA                            Grade Assessment: II - A patient with mild systemic                            disease. After reviewing the risks and benefits,                            the patient was deemed in satisfactory condition to                            undergo the procedure.                           After obtaining informed consent, the endoscope was  passed under direct vision. Throughout the                            procedure, the patient's blood pressure, pulse, and                            oxygen saturations were monitored continuously. The                            Olympus Scope [id number] was introduced through the                            mouth, and advanced to the second part of duodenum.                            The upper GI endoscopy was  accomplished without                            difficulty. The patient tolerated the procedure                            well. Scope In: Scope Out: Findings:                 LA Grade B (one or more mucosal breaks greater than                            5 mm, not extending between the tops of two mucosal                            folds) esophagitis with no bleeding was found 36 to                            38 cm from the incisors. Biopsies were taken with a                            cold forceps for histology.                           One benign-appearing, intrinsic mild                            (non-circumferential scarring) stenosis was found                            38 cm from the incisors. This stenosis measured 1.4                            cm (inner diameter). The stenosis was traversed                            after dilation. The scope was withdrawn. Dilation  was performed with a Maloney dilator with mild                            resistance at 50 Fr.                           A 4 cm hiatal hernia was present.                           Multiple 4 to 6 mm sessile polyps with no bleeding                            and no stigmata of recent bleeding were found in                            the gastric body. Three polyps were removed with a                            cold snare. Resection and retrieval were complete.                           The gastroesophageal flap valve was visualized                            endoscopically and classified as Hill Grade IV (no                            fold, wide open lumen, hiatal hernia present).                           The entire examined stomach was normal. Biopsies                            were taken with a cold forceps for histology.                           The examined duodenum was normal. Complications:            No immediate complications. Estimated Blood Loss:     Estimated blood loss:  none. Impression:               - LA Grade B reflux esophagitis with no bleeding.                            Biopsied.                           - Benign-appearing esophageal stenosis. Dilated.                           - 4 cm hiatal hernia.                           - Multiple gastric polyps. Resected and retrieved x  Recommendation:           -  Patient has a contact number available for                            emergencies. The signs and symptoms of potential                            delayed complications were discussed with the                            patient. Return to normal activities tomorrow.                            Written discharge instructions were provided to the                            patient.                           - Postdilatation diet.                           - Increase Prevacid  30 mg p.o. BID x 8 weeks, then                            QD                           - Await pathology results.                           - The findings and recommendations were discussed                            with the patient's family. Lynnie Bring, MD 11/15/2023 8:49:38 AM This report has been signed electronically.

## 2023-11-15 NOTE — Progress Notes (Signed)
 Called to room to assist during endoscopic procedure.  Patient ID and intended procedure confirmed with present staff. Received instructions for my participation in the procedure from the performing physician.

## 2023-11-16 ENCOUNTER — Telehealth: Payer: Self-pay | Admitting: *Deleted

## 2023-11-16 NOTE — Telephone Encounter (Signed)
  Follow up Call-     11/15/2023    7:27 AM  Call back number  Post procedure Call Back phone  # [phone]  Permission to leave phone message Yes     Patient questions:  Do you have a fever, pain , or abdominal swelling? No. Pain Score  0 *  Have you tolerated food without any problems? Yes.    Have you been able to return to your normal activities? Yes.    Do you have any questions about your discharge instructions: Diet   No. Medications  No. Follow up visit  No.  Do you have questions or concerns about your Care? No.  Actions: * If pain score is 4 or above: No action needed, pain <4.

## 2023-11-17 LAB — SURGICAL PATHOLOGY

## 2023-11-24 ENCOUNTER — Other Ambulatory Visit (HOSPITAL_BASED_OUTPATIENT_CLINIC_OR_DEPARTMENT_OTHER): Payer: Self-pay | Admitting: Pulmonary Disease

## 2023-11-24 MED ORDER — BUDESONIDE-FORMOTEROL FUMARATE 160-4.5 MCG/ACT IN AERO: 2.0000 | INHALATION_SPRAY | Freq: Two times a day (BID) | RESPIRATORY_TRACT | 3 refills | Status: AC

## 2023-11-25 ENCOUNTER — Ambulatory Visit: Payer: Self-pay | Admitting: Gastroenterology

## 2023-12-03 ENCOUNTER — Other Ambulatory Visit: Payer: Self-pay | Admitting: Family Medicine

## 2023-12-03 DIAGNOSIS — K219 Gastro-esophageal reflux disease without esophagitis: Secondary | ICD-10-CM

## 2023-12-07 ENCOUNTER — Ambulatory Visit: Admitting: Bariatrics

## 2023-12-07 ENCOUNTER — Encounter: Payer: Self-pay | Admitting: Bariatrics

## 2023-12-07 VITALS — BP 154/73 | HR 98 | Ht 70.0 in | Wt 229.0 lb

## 2023-12-07 DIAGNOSIS — G4700 Insomnia, unspecified: Secondary | ICD-10-CM

## 2023-12-07 DIAGNOSIS — E119 Type 2 diabetes mellitus without complications: Secondary | ICD-10-CM | POA: Diagnosis not present

## 2023-12-07 DIAGNOSIS — Z7985 Long-term (current) use of injectable non-insulin antidiabetic drugs: Secondary | ICD-10-CM

## 2023-12-07 DIAGNOSIS — E669 Obesity, unspecified: Secondary | ICD-10-CM | POA: Diagnosis not present

## 2023-12-07 DIAGNOSIS — Z6832 Body mass index (BMI) 32.0-32.9, adult: Secondary | ICD-10-CM

## 2023-12-07 DIAGNOSIS — G4709 Other insomnia: Secondary | ICD-10-CM

## 2023-12-07 DIAGNOSIS — E66811 Obesity, class 1: Secondary | ICD-10-CM

## 2023-12-07 NOTE — Progress Notes (Signed)
 WEIGHT SUMMARY AND BIOMETRICS  Weight Lost Since Last Visit: 4lb  Weight Gained Since Last Visit: 0lb   Vitals BP: (!) 154/73 Pulse Rate: 98 SpO2: 97 %   Anthropometric Measurements Height: 5' 10 (1.778 m) Weight: 229 lb (103.9 kg) BMI (Calculated): 32.86 Weight at Last Visit: 233lb Weight Lost Since Last Visit: 4lb Weight Gained Since Last Visit: 0lb Starting Weight: 249lb Total Weight Loss (lbs): 19 lb (8.618 kg)   Body Composition  Body Fat %: 29.8 % Fat Mass (lbs): 68.4 lbs Muscle Mass (lbs): 153 lbs Total Body Water (lbs): 109.4 lbs Visceral Fat Rating : 18   Other Clinical Data Fasting: no Labs: no Today's Visit #: 26 Starting Date: 04/14/21    OBESITY Marquest is here to discuss his progress with his obesity treatment plan along with follow-up of his obesity related diagnoses.    Nutrition Plan: low carbohydrate plan - 100% adherence.  Current exercise: walking and stretches.  Interim History:  He is down 4 lbs since his last visit. He states that he has been involved with more activities and celebrations.  Eating all of the food on the plan., Protein intake is as prescribed, Is not skipping meals, and Water intake is adequate.   Pharmacotherapy: Marciano is on Mounjaro  7.5 mg SQ weekly Adverse side effects: None Hunger is moderately controlled.  Cravings are moderately controlled.  Assessment/Plan:   Type II Diabetes HgbA1c is at goal. Last A1c was 6.4 CBGs: not checking on a regular basis.    Episodes of hypoglycemia: no Medication(s): Mounjaro  7.5 mg SQ weekly  Lab Results  Component Value Date   HGBA1C 6.4 03/07/2023   HGBA1C 6.3 (H) 12/24/2021   HGBA1C 5.5 09/21/2021   Lab Results  Component Value Date   LDLCALC 129 (H) 03/07/2023   CREATININE 0.96 03/07/2023   Lab Results  Component Value Date   GFR 83.58  03/07/2023   GFR 70.46 02/24/2021   GFR 76.32 10/19/2020    Plan: Continue Mounjaro  7.5 mg SQ weekly Will keep all carbohydrates low both sweets and starches.  Will continue exercise regimen to 30 to 60 minutes on most days of the week.  Aim for 7 to 9 hours of sleep nightly.  Eat more low glycemic index foods.  He will increase his protein intake and have protein with each meal.   Insomnia:   He has had some sleep issues both with falling asleep and staying asleep.  He was prescribed Restoril  but does not like to take prescription medications for sleep.  Plan: Discussed herbal alternatives for sleep such as extended release melatonin, lemon balm, L-theanine, magnesium  glycinate, and valerian root.  He will decide on a combination for himself.    Generalized Obesity: Current BMI BMI (Calculated): 32.86   Pharmacotherapy Plan Continue  Mounjaro  7.5 mg SQ weekly  Demitri is currently in the action stage  of change. As such, his goal is to continue with weight loss efforts.  He has agreed to low carbohydrate plan.  Exercise goals: Older adults should determine their level of effort for physical activity relative to their level of fitness.  He will add in more resistance.  Behavioral modification strategies: increasing lean protein intake, no meal skipping, meal planning , increase water intake, better snacking choices, planning for success, keep healthy foods in the home, and increase frequency of journaling.  Ramonte has agreed to follow-up with our clinic in 4 weeks.       Objective:   VITALS: Per patient if applicable, see vitals. GENERAL: Alert and in no acute distress. CARDIOPULMONARY: No increased WOB. Speaking in clear sentences.  PSYCH: Pleasant and cooperative. Speech normal rate and rhythm. Affect is appropriate. Insight and judgement are appropriate. Attention is focused, linear, and appropriate.  NEURO: Oriented as arrived to appointment on time with no prompting.    Attestation Statements:   This was prepared with the assistance of Engineer, Civil (consulting).  Occasional wrong-word or sound-a-like substitutions may have occurred due to the inherent limitations of voice recognition

## 2024-01-02 ENCOUNTER — Ambulatory Visit: Admitting: Bariatrics

## 2024-01-02 ENCOUNTER — Encounter: Payer: Self-pay | Admitting: Bariatrics

## 2024-01-02 VITALS — BP 159/82 | HR 80 | Ht 70.0 in | Wt 232.0 lb

## 2024-01-02 DIAGNOSIS — E669 Obesity, unspecified: Secondary | ICD-10-CM | POA: Diagnosis not present

## 2024-01-02 DIAGNOSIS — E559 Vitamin D deficiency, unspecified: Secondary | ICD-10-CM

## 2024-01-02 DIAGNOSIS — Z7985 Long-term (current) use of injectable non-insulin antidiabetic drugs: Secondary | ICD-10-CM

## 2024-01-02 DIAGNOSIS — Z6833 Body mass index (BMI) 33.0-33.9, adult: Secondary | ICD-10-CM | POA: Diagnosis not present

## 2024-01-02 DIAGNOSIS — E119 Type 2 diabetes mellitus without complications: Secondary | ICD-10-CM

## 2024-01-02 DIAGNOSIS — E66811 Obesity, class 1: Secondary | ICD-10-CM

## 2024-01-02 MED ORDER — VITAMIN D (ERGOCALCIFEROL) 1.25 MG (50000 UNIT) PO CAPS: ORAL_CAPSULE | ORAL | 0 refills | Status: DC

## 2024-01-02 MED ORDER — TIRZEPATIDE 7.5 MG/0.5ML ~~LOC~~ SOAJ: 7.5000 mg | SUBCUTANEOUS | 0 refills | Status: DC

## 2024-01-02 NOTE — Progress Notes (Signed)
 WEIGHT SUMMARY AND BIOMETRICS  Weight Lost Since Last Visit: 0lb  Weight Gained Since Last Visit: 3lb   Vitals BP: (!) 159/82 Pulse Rate: 80 SpO2: 99 %   Anthropometric Measurements Height: 5' 10 (1.778 m) Weight: 232 lb (105.2 kg) BMI (Calculated): 33.29 Weight at Last Visit: 229lb Weight Lost Since Last Visit: 0lb Weight Gained Since Last Visit: 3lb Starting Weight: 249lb Total Weight Loss (lbs): 16 lb (7.258 kg)   Body Composition  Body Fat %: 29.1 % Fat Mass (lbs): 67.6 lbs Muscle Mass (lbs): 156.4 lbs Total Body Water (lbs): 114.2 lbs Visceral Fat Rating : 18   Other Clinical Data Fasting: no Labs: no Today's Visit #: 27 Starting Date: 04/14/21    OBESITY Thomas [name] [name] here to discuss his progress with his obesity treatment plan along with follow-up of his obesity related diagnoses.    Nutrition Plan: low carbohydrate plan - 90% adherence.  Current exercise: yard work  Interim History:  He [name] up 3 lbs since his last visit which was over the holiday.  Eating all of the food on the plan., Protein intake [name] as prescribed, and Water intake [name] adequate.   Pharmacotherapy: Thomas [name] on Mounjaro  7.5 mg SQ weekly Adverse side effects: None Hunger [name] moderately controlled.  Cravings are moderately controlled.  Assessment/Plan:   Vitamin D  Deficiency Vitamin D  [name] at goal of 50.  Most recent vitamin D  level was 55.5. He [name] on  prescription ergocalciferol  50,000 IU weekly. Lab Results  Component Value Date   VD25OH 55.5 09/21/2021   VD25OH 43.6 04/14/2021   VD25OH 56.1 02/12/2018    Plan: Refill prescription vitamin D  50,000 IU   Generalized Obesity: Current BMI BMI (Calculated): 33.29   Type II Diabetes HgbA1c [name] at goal. Last A1c was 6.4 CBGs: Not checking      Episodes of hypoglycemia: no Medication(s): Zepbound  7.5 mg SQ  weekly  Lab Results  Component Value Date   HGBA1C 6.4 03/07/2023   HGBA1C 6.3 (H) 12/24/2021   HGBA1C 5.5 09/21/2021   Lab Results  Component Value Date   LDLCALC 129 (H) 03/07/2023   CREATININE 0.96 03/07/2023   Lab Results  Component Value Date   GFR 83.58 03/07/2023   GFR 70.46 02/24/2021   GFR 76.32 10/19/2020    Plan: Continue and refill Mounjaro  7.5 mg SQ weekly Continue all other medications.  Will keep all carbohydrates low both sweets and starches.  Will continue exercise regimen to 30 to 60 minutes on most days of the week (cardio and resistance).    Pharmacotherapy Plan Continue  Mounjaro  7.5 mg SQ weekly  Thomas [name] [name] currently in the action stage of change. As such, his goal [name] to continue with weight loss efforts.  He has agreed to low carbohydrate plan.  Exercise goals: Older adults should determine their level of effort for physical activity relative  to their level of fitness.   Behavioral modification strategies: increasing lean protein intake, no meal skipping, meal planning , increase water intake, better snacking choices, planning for success, increasing vegetables, avoiding temptations, keep healthy foods in the home, weigh protein portions, and measure portion sizes.  Thomas [name] has agreed to follow-up with our clinic in 4 weeks.    Objective:   VITALS: Per patient if applicable, see vitals. GENERAL: Alert and in no acute distress. CARDIOPULMONARY: No increased WOB. Speaking in clear sentences.  PSYCH: Pleasant and cooperative. Speech normal rate and rhythm. Affect [name] appropriate. Insight and judgement are appropriate. Attention [name] focused, linear, and appropriate.  NEURO: Oriented as arrived to appointment on time with no prompting.   Attestation Statements:   This was prepared with the assistance of Engineer, Civil (consulting).  Occasional wrong-word or sound-a-like substitutions may have occurred due to the inherent limitations of voice recognition.    Clayborne Daring, DO

## 2024-01-04 ENCOUNTER — Ambulatory Visit: Admitting: Bariatrics

## 2024-01-09 ENCOUNTER — Ambulatory Visit (HOSPITAL_BASED_OUTPATIENT_CLINIC_OR_DEPARTMENT_OTHER): Payer: Self-pay | Admitting: Pulmonary Disease

## 2024-01-09 NOTE — Telephone Encounter (Signed)
 Pt requesting steriods. Scheduled 01/11/24.  Attempted to schedule sooner d/t triage, pt refused appt with alternate pulmonologist or PCP office. ED advised with any worsening of symptoms.   E2C2 Pulmonary Triage - Initial Assessment Questions "Chief Complaint (e.g., cough, sob, wheezing, fever, chills, sweat or additional symptoms) *Go to specific symptom protocol after initial questions. SOB, congestion x 3-4 days. Recent possible CO exposure.   "How long have symptoms been present?" 3-4 days  Have you tested for COVID or Flu? Note: If not, ask patient if a home test can be taken. If so, instruct patient to call back for positive results. No - pt states I don't have covid  MEDICINES:   "Have you used any OTC meds to help with symptoms?" Yes If yes, ask "What medications?" Vit C & D  "Have you used your inhalers/maintenance medication?" Yes If yes, "What medications?" albuterol   If inhaler, ask "How many puffs and how often?" Note: Review instructions on medication in the chart. Using nebulizer mostly, using inhaler only PRN  OXYGEN: "Do you wear supplemental oxygen?" No If yes, "How many liters are you supposed to use?" NA  "Do you monitor your oxygen levels?" No If yes, What is your reading (oxygen level) today? No monitor  What is your usual oxygen saturation reading?  (Note: Pulmonary O2 sats should be 90% or greater) NA  FYI Only or Action Required?: Action required by provider: clinical question for provider and update on patient condition.  Patient is followed in Pulmonology for Asthma, last seen on 09/12/2023 by Kassie Acquanetta Bradley, MD.  Called Nurse Triage reporting URI.  Symptoms began several days ago.  Interventions attempted: Nebulizer treatments.  Symptoms are: gradually worsening.  Triage Disposition: Home Care, See HCP Within 4 Hours (Or PCP Triage) - pt refused earlier appt with alternate provider or PCP office  Patient/caregiver understands  and will follow disposition?: No, refuses disposition Reason for Disposition  [1] MILD difficulty breathing (e.g., minimal/no SOB at rest, SOB with walking, pulse < 100) AND [2] NEW-onset or WORSE than normal  [1] Longstanding difficulty breathing (e.g., CHF, COPD, emphysema) AND [2] WORSE than normal  CO poisoning, questions about  Answer Assessment - Initial Assessment Questions Pt reports chest tightness, congestion, sinusitis, upper back soreness and weakness x  3-4 days. Patient states is using his treatments with mild improvement. Speaking in clear and full sentences.  States had recent CO leak in their garage that was repaired Sunday AM. Does not have alarm in the home, but is having one installed. Denies HA, memory issues, confusion, ataxia.  ED advised with any new or worsening symptoms.  1. RESPIRATORY STATUS: Describe your breathing? (e.g., wheezing, shortness of breath, unable to speak, severe coughing)      Tight with SOB with exertion  2. ONSET: When did this breathing problem begin?      3-4 days  3. PATTERN Does the difficult breathing come and go, or has it been constant since it started?      Depends on activity level  6. CARDIAC HISTORY: Do you have any history of heart disease? (e.g., heart attack, angina, bypass surgery, angioplasty)      None listed  7. LUNG HISTORY: Do you have any history of lung disease?  (e.g., pulmonary embolus, asthma, emphysema)     Asthma  Answer Assessment - Initial Assessment Questions .  Protocols used: Breathing Difficulty-A-AH, Carbon Monoxide Exposure-A-AH   Copied from [id number]. Topic: Clinical - Red Word Triage >> Jan 09, 2024  2:06 PM Chantha C wrote: Red Word that prompted transfer to Nurse Triage: Patient [phone] wants to see Dr. Kassie, states having trouble with breathing, tightness in chest, back and lungs, shortness of breath, shaking feeling, flushing sinus and the color is yellowish and green  discharge, pain in upper chest and upper back and lightheaded. Patient denies denies wheezing and blood pressure is good 120/80. Patient had a gas leak at home and unsure if this is causing symptoms. Please advise. Past Medical History:  Diagnosis Date   Allergy    Anxiety    Asthma    Back pain    COVID-18 Nov 2021   Diabetes [facility])    GERD (gastroesophageal reflux disease)    History of hiatal hernia    Nasal polyposis    Pneumonia    5 -7 yrs ago   Swallowing difficulty

## 2024-01-09 NOTE — Telephone Encounter (Signed)
 Appt made on 01/11/2024

## 2024-01-11 ENCOUNTER — Ambulatory Visit (HOSPITAL_BASED_OUTPATIENT_CLINIC_OR_DEPARTMENT_OTHER): Payer: Self-pay | Admitting: Pulmonary Disease

## 2024-01-11 ENCOUNTER — Ambulatory Visit (INDEPENDENT_AMBULATORY_CARE_PROVIDER_SITE_OTHER)

## 2024-01-11 ENCOUNTER — Encounter (HOSPITAL_BASED_OUTPATIENT_CLINIC_OR_DEPARTMENT_OTHER): Payer: Self-pay | Admitting: Pulmonary Disease

## 2024-01-11 ENCOUNTER — Ambulatory Visit (INDEPENDENT_AMBULATORY_CARE_PROVIDER_SITE_OTHER): Admitting: Pulmonary Disease

## 2024-01-11 VITALS — BP 127/76 | HR 72 | Ht 70.0 in | Wt 230.5 lb

## 2024-01-11 DIAGNOSIS — R0981 Nasal congestion: Secondary | ICD-10-CM

## 2024-01-11 DIAGNOSIS — R0602 Shortness of breath: Secondary | ICD-10-CM | POA: Diagnosis not present

## 2024-01-11 DIAGNOSIS — J4541 Moderate persistent asthma with (acute) exacerbation: Secondary | ICD-10-CM | POA: Diagnosis not present

## 2024-01-11 DIAGNOSIS — Z87891 Personal history of nicotine dependence: Secondary | ICD-10-CM | POA: Diagnosis not present

## 2024-01-11 DIAGNOSIS — J984 Other disorders of lung: Secondary | ICD-10-CM

## 2024-01-11 MED ORDER — AMOXICILLIN-POT CLAVULANATE 875-125 MG PO TABS: 1.0000 | ORAL_TABLET | Freq: Two times a day (BID) | ORAL | 0 refills | Status: AC

## 2024-01-11 MED ORDER — PREDNISONE 10 MG PO TABS: ORAL_TABLET | ORAL | 0 refills | Status: AC

## 2024-01-11 NOTE — Progress Notes (Signed)
 Subjective:   PATIENT ID: Thomas [name] [name] GENDER: male DOB: 10/25/1960, MRN: [id number]  Chief Complaint  Patient presents with   Cough   Shortness of Breath    Shortness of breath, lightheaded x 4-5 days had a gas leak in the home- possibly due to this    Reason for Visit: Follow-up  Mr. Draycen Leichter is a 63 year old male with asthma, nasal polyposis, chronic sinusitis, DM, GERD who presents for follow-up for recurrent pneumonias.  Synopsis: Has been well controlled on Symbicort  for >5 years. After switching to [facility] he feels he has had increased respiratory symptoms including recent hospitalization in 12/2021. Reviewed chart in EMR including recent pulmonary note on 01/07/22. Developed Covid 19 in 10/2021. Since then has had multiple episodes of respiratory failure with pulmonary infiltrates. Has been offered biologics however since he had stable asthma symptoms prior to this, this was avoided.  01/19/23 Since our last visit he had a URI in November and seen by his PCP. Zpack did not improve so given flagyl . Since then he is no longer coughing sputum, he reports he has been using his nebulizer 2-3 times a week. Has been compliant with his Symbicort . Having some palpitations  09/12/23 Since our last visit he was seen at [facility] by NP Cobb for asthma exacerbation secondary to covid infection, treated with prednisone . Recovered since then with wheezing sometimes at night. Occasional cough. Compliant with Symbicort  twice a day. Uses nebulizer twice a week.  01/11/24 Since our last visit he recently had a gas leak in the house over the weekend and feeling lightheaded. But began having weakness and shortness of breath one week ago. Feels winded with activity. Occasional productive cough. Associated chest tightness and is improved with nebulizer. No known sick contacts. Denies muscles aches. No fevers or chills. Has been using saline rinses for sinus issues.  Prior inhalers: Breo -  double pneumonia/hospitalization. Does not tolerate powders Breztri  - ok until he can switch to Symbicort   Social History: Smoked in his 64's to age 61, approximately 1/2-1ppd, quid 1990.  Previously worked as education officer, environmental. He drives to [name] every weekend for work. 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  Quit in 1990 Has adult kids in 30-40s. Five grandchildren  Past Medical History:  Diagnosis Date   Allergy    Anxiety    Asthma    Back pain    COVID-18 Nov 2021   Diabetes [facility])    GERD (gastroesophageal reflux disease)    History of hiatal hernia    Nasal polyposis    Pneumonia    5 -7 yrs ago   Swallowing difficulty      Family History  Problem Relation Age of Onset   Esophageal cancer Mother    Cancer Mother    Prostate cancer Father    Cancer Father    Hyperlipidemia Father    Diabetes Other        maternal family   Colon cancer Neg Hx    Colon polyps Neg Hx    Rectal cancer Neg Hx    Stomach cancer Neg Hx      Social History   Occupational History   Occupation: Education Officer, Environmental full time  Tobacco Use   Smoking status: Former    Current packs/day: 0.00  Average packs/day: 1 pack/day for 10.0 years (10.0 ttl pk-yrs)    Types: Cigarettes    Start date: 02/01/1976    Quit date: 01/31/1986    Years since quitting: 37.9   Smokeless tobacco: Never  Vaping Use   Vaping status: Never Used  Substance and Sexual Activity   Alcohol use: Yes    Comment: rarely   Drug use: No   Sexual activity: Not on file    No Known Allergies    Outpatient Medications Prior to Visit  Medication Sig Dispense Refill   albuterol  (PROVENTIL ) (2.5 MG/3ML) 0.083% nebulizer solution Take 3 mLs (2.5 mg total) by nebulization every 4 (four) hours as needed for wheezing or shortness of breath. 225 mL 3    albuterol  (VENTOLIN  HFA) 108 (90 Base) MCG/ACT inhaler INHALE 2 PUFFS EVERY 6 HOURS AS NEEDED FOR WHEEZING OR SHORTNESS OF BREATH. 54 each 1   budesonide -formoterol  (SYMBICORT ) 160-4.5 MCG/ACT inhaler Inhale 2 puffs into the lungs 2 (two) times daily. 30.6 g 3   celecoxib  (CELEBREX ) 200 MG capsule TAKE 1 CAPSULE BY MOUTH TWICE A DAY AS NEEDED 60 capsule 2   fluticasone  (FLONASE ) 50 MCG/ACT nasal spray SPRAY 2 SPRAYS INTO EACH NOSTRIL EVERY DAY 48 mL 3   ketoconazole  (NIZORAL ) 2 % cream Apply 1 Application topically 2 (two) times daily. 60 g 3   lansoprazole  (PREVACID ) 30 MG capsule Take 1 capsule (30 mg total) by mouth 2 (two) times daily before a meal. Take twice a day for 8 weeks, then decrease to once a day. 60 capsule 11   methocarbamol  (ROBAXIN ) 500 MG tablet TAKE 1 TABLET BY MOUTH EVERY 6 HOURS AS NEEDED FOR MUSCLE SPASMS. 60 tablet 5   montelukast  (SINGULAIR ) 10 MG tablet Take 1 tablet (10 mg total) by mouth daily. 90 tablet 3   tirzepatide  (MOUNJARO ) 7.5 MG/0.5ML Pen Inject 7.5 mg into the skin once a week. 6 mL 0   Vitamin D , Ergocalciferol , (DRISDOL ) 1.25 MG (50000 UNIT) CAPS capsule TAKE 1 CAPSULE (50,000 UNITS TOTAL) BY MOUTH EVERY 14 DAYS 6 capsule 0   benzonatate  (TESSALON ) 200 MG capsule Take 1 capsule (200 mg total) by mouth 3 (three) times daily as needed for cough. (Patient not taking: Reported on 01/11/2024) 30 capsule 1   HYDROcodone  bit-homatropine (HYCODAN) 5-1.5 MG/5ML syrup Take 5 mLs by mouth every 6 (six) hours as needed for cough. (Patient not taking: Reported on 01/11/2024) 240 mL 0   temazepam  (RESTORIL ) 30 MG capsule Take 1 capsule (30 mg total) by mouth at bedtime as needed for sleep. (Patient not taking: Reported on 01/11/2024) 30 capsule 0   triamcinolone  cream (KENALOG ) 0.1 % Apply 1 Application topically 2 (two) times daily as needed (rash). (Patient not taking: Reported on 01/11/2024) 45 g 2   No facility-administered medications prior to visit.    Review of  Systems  Constitutional:  Negative for chills, diaphoresis, fever, malaise/fatigue and weight loss.  HENT:  Negative for congestion.   Respiratory:  Positive for cough, sputum production and shortness of breath. Negative for hemoptysis and wheezing.   Cardiovascular:  Negative for chest pain, palpitations and leg swelling.     Objective:   Vitals:   01/11/24 1005  BP: 127/76  Pulse: 72  SpO2: 100%  Weight: 230 lb 8 oz (104.6 kg)  Height: 5' 10 (1.778 m)       Physical Exam: General: Well-appearing, no acute distress HENT: [place], AT Eyes: EOMI, no scleral icterus Respiratory: Clear to auscultation bilaterally.  No crackles, wheezing or rales Cardiovascular: RRR, -M/R/G, no JVD Extremities:-Edema,-tenderness Neuro: AAO x4, CNII-XII grossly intact Psych: Normal mood, normal affect  Data Reviewed:  Imaging: CT A/P 07/04/11 - Visualized lung bases without scarring or atelectasis CT A/P 10/03/20 - Minimal left basilar scarring/atelectasis CTA 12/24/21 - RUL 3 mm subpleural nodule. Bilateral patchy GGO CXR 12/28/22 Worsening RML/RLL opacities CT Chest 07/11/22 - Near complete interval resolution of consolidations. Stable 3 mm solid lung nodules  PFT: 01/04/18 FVC 5.11 (122%) FEV1 4.38 (133%) Ratio 86  TLC 66% DLCO 114%. No significant BD response Interpretation: Mild restrictive defect. Normal DLC  Labs: CBC    Component Value Date/Time   WBC 7.2 03/07/2023 1523   RBC 4.64 03/07/2023 1523   HGB 14.6 03/07/2023 1523   HGB 16.0 10/23/2017 0953   HCT 43.3 03/07/2023 1523   HCT 42.6 10/23/2017 0953   PLT 349.0 03/07/2023 1523   MCV 93.5 03/07/2023 1523   MCV 88 10/23/2017 0953   MCH 31.9 12/26/2021 0350   MCHC 33.7 03/07/2023 1523   RDW 13.4 03/07/2023 1523   RDW 13.4 10/23/2017 0953   LYMPHSABS 2.0 03/07/2023 1523   LYMPHSABS 2.2 10/23/2017 0953   MONOABS 0.5 03/07/2023 1523   EOSABS 0.2 03/07/2023 1523   EOSABS 0.5 (H) 10/23/2017 0953   BASOSABS 0.1 03/07/2023 1523    BASOSABS 0.0 10/23/2017 0953   Absolute eos 10/23/17 - 500 12/24/21 - 100 - improved  CXR 01/11/24 reviewed. No acute infiltrate effusion or edema    Assessment & Plan:   Discussion: 63 year old male with asthma, nasal polyposis, chronic sinusitis, DM, GERD who presents for follow-up for asthma and hx recurrent pneumonias. Last significant episode of PNA in 10/2021. CT scan in 07/2022 with resolved infiltrates. Previously well controlled asthma on Symbicort  for >5 years. Also has good results with generic Symbicort . Counseled on additional of nebulizer for breakthrough symptoms.  Today with mild exacerbation. CXR with no infiltrate. Advised patient to not take antibiotics.  Moderate persistent asthma - overall well controlled, currently in mild exacerbation Mild restrictive defect with normal DLCO - no evidence of fibrosis on imaging --Prednisone  taper --CONTINUE Symbicort  160-4.5 mcg TWO puffs in the morning and evening. Rinse mouth out after use --CONTINUE Albuterol  inhaler every 4-6 hours AS NEEDED  --CONTINUE nebulizer as needed. REFILLED Duonebs  Nasal congestion Previously on Astelin 0.15%.  CONTINUE flonase  two puffs per nare daily  RML/RLL consolidation - resolved on June 2024 CT scan S/p bronchoscopy with BAL 12/28/22. Predominantly monocytic and neutrophilic, 56% and 41% respectively --Cultures and cytology negative --No further follow-up  Health Maintenance Immunization History  Administered Date(s) Administered   Fluad Quad(high Dose 65+) 01/03/2019, 12/04/2019, 12/29/2020   INFLUENZA, HIGH DOSE SEASONAL PF 03/14/2018   PFIZER Comirnaty(Gray Top)Covid-19 Tri-Sucrose Vaccine 05/25/2020   PFIZER(Purple Top)SARS-COV-2 Vaccination 03/12/2019, 04/06/2019, 01/14/2020   Tdap 08/21/2014   CT Lung Screen - not qualified. Insufficient tobacco history  Orders Placed This Encounter  Procedures   DG Chest 2 View    Standing Status:   Future    Number of Occurrences:   1     Expiration Date:   01/10/2025    Reason for Exam (SYMPTOM  OR DIAGNOSIS REQUIRED):   shortness of breath    Preferred imaging location?:   [facility]   Meds ordered this encounter  Medications   predniSONE  (DELTASONE ) 10 MG tablet    Sig: Take 4 tablets (40 mg total) by mouth daily with breakfast for 2 days, THEN  3 tablets (30 mg total) daily with breakfast for 2 days, THEN 2 tablets (20 mg total) daily with breakfast for 2 days, THEN 1 tablet (10 mg total) daily with breakfast for 2 days.    Dispense:  20 tablet    Refill:  0   amoxicillin -clavulanate (AUGMENTIN ) 875-125 MG tablet    Sig: Take 1 tablet by mouth 2 (two) times daily.    Dispense:  14 tablet    Refill:  0    Return in about 6 months (around 07/11/2024).  I have spent a total time of 31-minutes on the day of the appointment including chart review, data review, collecting history, coordinating care and discussing medical diagnosis and plan with the patient/family. Past medical history, allergies, medications were reviewed. Pertinent imaging, labs and tests included in this note have been reviewed and interpreted independently by me.   Rendon Howell Slater Staff, MD [facility] 01/11/2024 9:04 AM

## 2024-01-11 NOTE — Patient Instructions (Addendum)
 Moderate persistent asthma - overall well controlled, currently in mild exacerbation Mild restrictive defect with normal DLCO - no evidence of fibrosis on imaging --Prednisone  taper --CONTINUE Symbicort  160-4.5 mcg TWO puffs in the morning and evening. Rinse mouth out after use --CONTINUE Albuterol  inhaler every 4-6 hours AS NEEDED  --CONTINUE nebulizer as needed. REFILLED Duonebs  CXR today. Will call you if you need to take antibiotics

## 2024-02-08 ENCOUNTER — Ambulatory Visit (INDEPENDENT_AMBULATORY_CARE_PROVIDER_SITE_OTHER): Admitting: Bariatrics

## 2024-02-08 ENCOUNTER — Encounter: Payer: Self-pay | Admitting: Bariatrics

## 2024-02-08 VITALS — BP 148/77 | HR 91 | Temp 97.4°F | Ht 70.0 in | Wt 230.0 lb

## 2024-02-08 DIAGNOSIS — Z6833 Body mass index (BMI) 33.0-33.9, adult: Secondary | ICD-10-CM

## 2024-02-08 DIAGNOSIS — Z7985 Long-term (current) use of injectable non-insulin antidiabetic drugs: Secondary | ICD-10-CM | POA: Diagnosis not present

## 2024-02-08 DIAGNOSIS — E119 Type 2 diabetes mellitus without complications: Secondary | ICD-10-CM | POA: Diagnosis not present

## 2024-02-08 DIAGNOSIS — E669 Obesity, unspecified: Secondary | ICD-10-CM | POA: Diagnosis not present

## 2024-02-08 DIAGNOSIS — E559 Vitamin D deficiency, unspecified: Secondary | ICD-10-CM | POA: Diagnosis not present

## 2024-02-08 MED ORDER — VITAMIN D (ERGOCALCIFEROL) 1.25 MG (50000 UNIT) PO CAPS: ORAL_CAPSULE | ORAL | 0 refills | Status: AC

## 2024-02-08 MED ORDER — TIRZEPATIDE 7.5 MG/0.5ML ~~LOC~~ SOAJ: 7.5000 mg | SUBCUTANEOUS | 0 refills | Status: AC

## 2024-02-08 NOTE — Progress Notes (Signed)
 "                                                                                                             WEIGHT SUMMARY AND BIOMETRICS  Weight Lost Since Last Visit: 2 lb  Weight Gained Since Last Visit: 0   Vitals Temp: (!) 97.4 F (36.3 C) BP: (!) 148/77 Pulse Rate: 91 SpO2: 98 %   Anthropometric Measurements Height: 5' 10 (1.778 m) Weight: 230 lb (104.3 kg) BMI (Calculated): 33 Weight at Last Visit: 232 lb Weight Lost Since Last Visit: 2 lb Weight Gained Since Last Visit: 0 Starting Weight: 249 lb Total Weight Loss (lbs): 19 lb (8.618 kg) Peak Weight: 257 lb   Body Composition  Body Fat %: 28.9 % Fat Mass (lbs): 66.6 lbs Muscle Mass (lbs): 155.6 lbs Total Body Water (lbs): 111 lbs Visceral Fat Rating : 17   Other Clinical Data Fasting: no Labs: no Today's Visit #: 28 Starting Date: 04/14/21    OBESITY Thomas [name] is here to discuss his progress with his obesity treatment plan along with follow-up of his obesity related diagnoses.    Nutrition Plan: low carbohydrate plan - 90% adherence.  Current exercise: walking, yard work, and resistance exercises, 3-4 times weekly, for about 35-40 minutes..  Interim History:  He is down 2 lbs since his last visit.  Eating all of the food on the plan., Protein intake is as prescribed, and Water intake is adequate.   Pharmacotherapy: Thomas [name] is on Mounjaro  7.5 mg SQ weekly Adverse side effects: None Hunger is moderately controlled.  Cravings are moderately controlled.  Assessment/Plan:   1. Vitamin D  deficiency Vitamin D  Deficiency Vitamin D  is at goal of 50.  Most recent vitamin D  level was 55.5. He is on  prescription ergocalciferol  50,000 IU weekly. Lab Results  Component Value Date   VD25OH 55.5 09/21/2021   VD25OH 43.6 04/14/2021   VD25OH 56.1 02/12/2018    Plan: Continue prescription vitamin D  50,000 IU weekly.  Will continue vitamin D  rich foods.   Type II Diabetes HgbA1c is at goal.  Last A1c was 6.4 CBGs: Not checking      Episodes of hypoglycemia: no Medication(s): Mounjaro  7.5 mg SQ weekly  Lab Results  Component Value Date   HGBA1C 6.4 03/07/2023   HGBA1C 6.3 (H) 12/24/2021   HGBA1C 5.5 09/21/2021   Lab Results  Component Value Date   LDLCALC 129 (H) 03/07/2023   CREATININE 0.96 03/07/2023   Lab Results  Component Value Date   GFR 83.58 03/07/2023   GFR 70.46 02/24/2021   GFR 76.32 10/19/2020    Plan: Continue and refill Mounjaro  7.5 mg SQ weekly Will keep all carbohydrates low both sweets and starches.  Will continue exercise regimen to 30 to 60 minutes on most days of the week.  Aim for 7 to 9 hours of sleep nightly.  Eat more low glycemic index foods.  Will continue with his exercise on a regular basis doing both cardio and resistance. Will continue to cook more at home.  He has started his yearly fast and will really focus on keeping his protein up and will high-protein throughout the day in order to absorb protein adequately.     Generalized Obesity: Current BMI BMI (Calculated): 33   Pharmacotherapy Plan Continue and refill  Mounjaro  7.5 mg SQ weekly  Thomas [name] is currently in the action stage of change. As such, his goal is to continue with weight loss efforts.  He has agreed to low carbohydrate plan.  Exercise goals: Older adults should determine their level of effort for physical activity relative to their level of fitness.   Behavioral modification strategies: increasing lean protein intake, decreasing simple carbohydrates , no meal skipping, meal planning , better snacking choices, planning for success, increasing vegetables, increasing fiber rich foods, increase frequency of journaling, weigh protein portions, and measure portion sizes.  Thomas [name] has agreed to follow-up with our clinic in 4 weeks.    Objective:   VITALS: Per patient if applicable, see vitals. GENERAL: Alert and in no acute distress. CARDIOPULMONARY: No increased  WOB. Speaking in clear sentences.  PSYCH: Pleasant and cooperative. Speech normal rate and rhythm. Affect is appropriate. Insight and judgement are appropriate. Attention is focused, linear, and appropriate.  NEURO: Oriented as arrived to appointment on time with no prompting.   Attestation Statements:   This was prepared with the assistance of Engineer, Civil (consulting).  Occasional wrong-word or sound-a-like substitutions may have occurred due to the inherent limitations of voice recognition.    Clayborne Daring, DO     "

## 2024-03-12 ENCOUNTER — Ambulatory Visit: Admitting: Bariatrics
